# Patient Record
Sex: Female | Born: 1945 | Race: White | Hispanic: No | Marital: Married | Smoking: Former smoker
Health system: Southern US, Community
[De-identification: ages and names within clinical notes are randomized; demographics above are authoritative.]

## PROBLEM LIST (undated history)

## (undated) DIAGNOSIS — F1190 Opioid use, unspecified, uncomplicated: Secondary | ICD-10-CM

## (undated) DIAGNOSIS — M48061 Spinal stenosis, lumbar region without neurogenic claudication: Secondary | ICD-10-CM

## (undated) DIAGNOSIS — E782 Mixed hyperlipidemia: Secondary | ICD-10-CM

## (undated) DIAGNOSIS — Z973 Presence of spectacles and contact lenses: Secondary | ICD-10-CM

## (undated) DIAGNOSIS — I959 Hypotension, unspecified: Secondary | ICD-10-CM

## (undated) DIAGNOSIS — F419 Anxiety disorder, unspecified: Secondary | ICD-10-CM

## (undated) DIAGNOSIS — N183 Chronic kidney disease, stage 3 unspecified: Secondary | ICD-10-CM

## (undated) DIAGNOSIS — Z972 Presence of dental prosthetic device (complete) (partial): Secondary | ICD-10-CM

## (undated) DIAGNOSIS — E1140 Type 2 diabetes mellitus with diabetic neuropathy, unspecified: Secondary | ICD-10-CM

## (undated) DIAGNOSIS — I4510 Unspecified right bundle-branch block: Secondary | ICD-10-CM

## (undated) DIAGNOSIS — Z8709 Personal history of other diseases of the respiratory system: Secondary | ICD-10-CM

## (undated) DIAGNOSIS — Z87440 Personal history of urinary (tract) infections: Secondary | ICD-10-CM

## (undated) DIAGNOSIS — Z8679 Personal history of other diseases of the circulatory system: Secondary | ICD-10-CM

## (undated) DIAGNOSIS — Z8719 Personal history of other diseases of the digestive system: Secondary | ICD-10-CM

## (undated) DIAGNOSIS — R600 Localized edema: Secondary | ICD-10-CM

## (undated) DIAGNOSIS — T148XXD Other injury of unspecified body region, subsequent encounter: Secondary | ICD-10-CM

## (undated) DIAGNOSIS — Z87898 Personal history of other specified conditions: Secondary | ICD-10-CM

## (undated) DIAGNOSIS — M545 Low back pain, unspecified: Secondary | ICD-10-CM

## (undated) DIAGNOSIS — K219 Gastro-esophageal reflux disease without esophagitis: Secondary | ICD-10-CM

## (undated) DIAGNOSIS — Z794 Long term (current) use of insulin: Secondary | ICD-10-CM

## (undated) DIAGNOSIS — R0609 Other forms of dyspnea: Secondary | ICD-10-CM

## (undated) DIAGNOSIS — E119 Type 2 diabetes mellitus without complications: Secondary | ICD-10-CM

## (undated) DIAGNOSIS — G8929 Other chronic pain: Secondary | ICD-10-CM

## (undated) DIAGNOSIS — J309 Allergic rhinitis, unspecified: Secondary | ICD-10-CM

## (undated) DIAGNOSIS — N1330 Unspecified hydronephrosis: Secondary | ICD-10-CM

## (undated) DIAGNOSIS — I452 Bifascicular block: Secondary | ICD-10-CM

## (undated) DIAGNOSIS — R0600 Dyspnea, unspecified: Secondary | ICD-10-CM

## (undated) DIAGNOSIS — M479 Spondylosis, unspecified: Secondary | ICD-10-CM

## (undated) DIAGNOSIS — E041 Nontoxic single thyroid nodule: Secondary | ICD-10-CM

## (undated) DIAGNOSIS — D649 Anemia, unspecified: Secondary | ICD-10-CM

## (undated) DIAGNOSIS — E785 Hyperlipidemia, unspecified: Secondary | ICD-10-CM

## (undated) HISTORY — PX: MULTIPLE TOOTH EXTRACTIONS: SHX2053

---

## 1983-12-29 DIAGNOSIS — Z8709 Personal history of other diseases of the respiratory system: Secondary | ICD-10-CM

## 1983-12-29 DIAGNOSIS — Z8719 Personal history of other diseases of the digestive system: Secondary | ICD-10-CM

## 1983-12-29 HISTORY — DX: Personal history of other diseases of the respiratory system: Z87.09

## 1983-12-29 HISTORY — DX: Personal history of other diseases of the digestive system: Z87.19

## 1983-12-29 HISTORY — PX: OTHER SURGICAL HISTORY: SHX169

## 2013-08-21 ENCOUNTER — Other Ambulatory Visit: Payer: Self-pay | Admitting: Endocrinology

## 2013-09-11 ENCOUNTER — Other Ambulatory Visit: Payer: Self-pay | Admitting: Endocrinology

## 2013-09-12 ENCOUNTER — Other Ambulatory Visit: Payer: Self-pay | Admitting: *Deleted

## 2013-09-12 DIAGNOSIS — E785 Hyperlipidemia, unspecified: Secondary | ICD-10-CM | POA: Insufficient documentation

## 2013-09-12 DIAGNOSIS — E119 Type 2 diabetes mellitus without complications: Secondary | ICD-10-CM

## 2013-09-18 ENCOUNTER — Ambulatory Visit (INDEPENDENT_AMBULATORY_CARE_PROVIDER_SITE_OTHER): Payer: BC Managed Care – PPO | Admitting: Endocrinology

## 2013-09-18 ENCOUNTER — Encounter: Payer: Self-pay | Admitting: Endocrinology

## 2013-09-18 ENCOUNTER — Other Ambulatory Visit (INDEPENDENT_AMBULATORY_CARE_PROVIDER_SITE_OTHER): Payer: BC Managed Care – PPO

## 2013-09-18 ENCOUNTER — Telehealth: Payer: Self-pay | Admitting: Endocrinology

## 2013-09-18 VITALS — BP 120/60 | HR 88 | Temp 99.3°F | Ht 63.0 in | Wt 311.3 lb

## 2013-09-18 DIAGNOSIS — IMO0001 Reserved for inherently not codable concepts without codable children: Secondary | ICD-10-CM

## 2013-09-18 DIAGNOSIS — E785 Hyperlipidemia, unspecified: Secondary | ICD-10-CM

## 2013-09-18 DIAGNOSIS — Z23 Encounter for immunization: Secondary | ICD-10-CM

## 2013-09-18 DIAGNOSIS — E119 Type 2 diabetes mellitus without complications: Secondary | ICD-10-CM

## 2013-09-18 LAB — COMPREHENSIVE METABOLIC PANEL
ALT: 21 U/L (ref 0–35)
Albumin: 3.9 g/dL (ref 3.5–5.2)
CO2: 26 mEq/L (ref 19–32)
Calcium: 9.4 mg/dL (ref 8.4–10.5)
Chloride: 107 mEq/L (ref 96–112)
GFR: 50.74 mL/min — ABNORMAL LOW (ref >60.00)
Glucose, Bld: 236 mg/dL — ABNORMAL HIGH (ref 70–99)
Potassium: 4.7 mEq/L (ref 3.5–5.1)
Sodium: 140 mEq/L (ref 135–145)
Total Bilirubin: 0.4 mg/dL (ref 0.3–1.2)
Total Protein: 6.8 g/dL (ref 6.0–8.3)

## 2013-09-18 LAB — URINALYSIS, ROUTINE W REFLEX MICROSCOPIC
Bilirubin Urine: NEGATIVE
Ketones, ur: NEGATIVE
Urine Glucose: NEGATIVE
pH: 6 (ref 5.0–8.0)

## 2013-09-18 LAB — MICROALBUMIN / CREATININE URINE RATIO
Creatinine,U: 57.8 mg/dL
Microalb Creat Ratio: 23.9 mg/g (ref 0.0–30.0)
Microalb, Ur: 13.8 mg/dL — ABNORMAL HIGH (ref 0.0–1.9)

## 2013-09-18 LAB — LIPID PANEL
Cholesterol: 179 mg/dL (ref 0–200)
HDL: 46.3 mg/dL (ref >39.00)
Triglycerides: 204 mg/dL — ABNORMAL HIGH (ref 0.0–149.0)
VLDL: 40.8 mg/dL — ABNORMAL HIGH (ref 0.0–40.0)

## 2013-09-18 LAB — LDL CHOLESTEROL, DIRECT: Direct LDL: 106 mg/dL

## 2013-09-18 NOTE — Patient Instructions (Addendum)
Adjust U-500 insulin based on meals size mostly and add only 2-8 units more for higher readings; take this 30-40 min before eating   Reduce Amlodipine 10mg  to 1/2 daily  Take 2 HCTZ daily

## 2013-09-18 NOTE — Progress Notes (Signed)
Patient ID: Valerie [name], female   DOB: 1946/02/24, 67 y.o.   MRN: [id number]  Valerie [name] is an 67 y.o. female.   Reason for Appointment: Diabetes follow-up   History of Present Illness   Diagnosis: Type 2 DIABETES MELITUS, date of diagnosis: 1985      Previous history: She has been on insulin since her diagnosis and typically has had very labile blood sugars which are difficult to control evenly. She has been on various regimens including NPH, regular insulin and Lantus. She is usually requiring large doses of insulin and appears to do better with taking NPH rather than Lantus Also has been on Byetta which improves with postprandial control However has had a tendency to hypoglycemia fairly often especially after supper Her A1c generally tends to be lower than expected for her home readings  Recent history:     Because of difficulty getting her fasting readings down she was switched from Humalog to U-500 insulin She adjusts her insulin based on her blood sugar mostly and readings are still fluctuating considerably She was having very frequent hypoglycemia throughout the day until about 10 days ago and she had drastically reduce her insulin because of this However more recently her blood sugars are starting to increase and she is going up on her insulin again She had also stopped her Byetta but is going back on it this week. Currently using 10 mcg TIMING of insulin: She does tend to take her U 500 right at mealtime and occasionally after Also she will increase her dosage by 10-15 units if blood sugar is high and occasionally this may result in hypoglycemia She does tend to have relatively high postprandial readings in the mornings but usually not in the evenings  Oral hypoglycemic drugs: None        Side effects from medications: None Insulin regimen: NPH a.m. and 11 pm,  50-60 units.    U-500, 10 -30 U with meals  Monitors blood glucose: Once a day.    Glucometer: One Touch.           Blood Glucose readings from meter download: readings before breakfast: 59-230, nonfasting recently 31-303  Hypoglycemia frequency:  recently had sporadic low sugars overnight or midday but overall has had low sugars at all different times, lowest reading 25 overnight       Meals: 3 meals per day.  has breakfast at 8 AM, supper at 5:30 p.m.     The last HbgA1c was reported as 5.8 in February. Fructosamine 291 in 10/13    Wt Readings from Last 3 Encounters:  09/18/13 311 lb 4.8 oz (141.205 kg)    LABS:  Appointment on 09/18/2013  Component Date Value Range Status  . Hemoglobin A1C 09/18/2013 5.6  4.6 - 6.5 % Final   Glycemic Control Guidelines for People with Diabetes:Non Diabetic:  <6%Goal of Therapy: <7%Additional Action Suggested:  >8%   . Sodium 09/18/2013 140  135 - 145 mEq/L Final  . Potassium 09/18/2013 4.7  3.5 - 5.1 mEq/L Final  . Chloride 09/18/2013 107  96 - 112 mEq/L Final  . CO2 09/18/2013 26  19 - 32 mEq/L Final  . Glucose, Bld 09/18/2013 236* 70 - 99 mg/dL Final  . BUN 40/98/1191 33* 6 - 23 mg/dL Final  . Creatinine, Ser 09/18/2013 1.1  0.4 - 1.2 mg/dL Final  . Total Bilirubin 09/18/2013 0.4  0.3 - 1.2 mg/dL Final  . Alkaline Phosphatase 09/18/2013 40  39 - 117 U/L Final  .  AST 09/18/2013 23  0 - 37 U/L Final  . ALT 09/18/2013 21  0 - 35 U/L Final  . Total Protein 09/18/2013 6.8  6.0 - 8.3 g/dL Final  . Albumin 13/07/6577 3.9  3.5 - 5.2 g/dL Final  . Calcium 46/96/2952 9.4  8.4 - 10.5 mg/dL Final  . GFR 84/13/2440 50.74* >60.00 mL/min Final  . Microalb, Ur 09/18/2013 13.8* 0.0 - 1.9 mg/dL Final  . Creatinine,U 10/24/2535 57.8   Final  . Microalb Creat Ratio 09/18/2013 23.9  0.0 - 30.0 mg/g Final  . Cholesterol 09/18/2013 179  0 - 200 mg/dL Final   ATP III Classification       Desirable:  < 200 mg/dL               Borderline High:  200 - 239 mg/dL          High:  > = 644 mg/dL  . Triglycerides 09/18/2013 204.0* 0.0 - 149.0 mg/dL Final   Normal:  <034 mg/dLBorderline  High:  150 - 199 mg/dL  . HDL 09/18/2013 46.30  >39.00 mg/dL Final  . VLDL 74/25/9563 40.8* 0.0 - 40.0 mg/dL Final  . Total CHOL/HDL Ratio 09/18/2013 4   Final                  Men          Women1/2 Average Risk     3.4          3.3Average Risk          5.0          4.42X Average Risk          9.6          7.13X Average Risk          15.0          11.0                      . Color, Urine 09/18/2013 LT. YELLOW  Yellow;Lt. Yellow Final  . APPearance 09/18/2013 CLEAR  Clear Final  . Specific Gravity, Urine 09/18/2013 1.025  1.000-1.030 Final  . pH 09/18/2013 6.0  5.0 - 8.0 Final  . Total Protein, Urine 09/18/2013 TRACE  Negative Final  . Urine Glucose 09/18/2013 NEGATIVE  Negative Final  . Ketones, ur 09/18/2013 NEGATIVE  Negative Final  . Bilirubin Urine 09/18/2013 NEGATIVE  Negative Final  . Hgb urine dipstick 09/18/2013 LARGE  Negative Final  . Urobilinogen, UA 09/18/2013 0.2  0.0 - 1.0 Final  . Leukocytes, UA 09/18/2013 MODERATE  Negative Final  . Nitrite 09/18/2013 NEGATIVE  Negative Final  . WBC, UA 09/18/2013 21-50/hpf  0-2/hpf Final  . RBC / HPF 09/18/2013 11-20/hpf  0-2/hpf Final  . Mucus, UA 09/18/2013 Presence of  None Final  . Squamous Epithelial / LPF 09/18/2013 Few(5-10/hpf)  Rare(0-4/hpf) Final  . Bacteria, UA 09/18/2013 Few(10-50/hpf)  None Final  . Direct LDL 09/18/2013 106.0   Final   Optimal:  <100 mg/dLNear or Above Optimal:  100-129 mg/dLBorderline High:  130-159 mg/dLHigh:  160-189 mg/dLVery High:  >190 mg/dL      Medication List       This list is accurate as of: 09/18/13 11:59 PM.  Always use your most recent med list.               amLODipine 10 MG tablet  Commonly known as:  NORVASC  Take 10 mg by mouth daily.     BYETTA  10 MCG PEN 10 MCG/0.04ML Sopn injection  Generic drug:  exenatide  Inject 10 mcg into the skin 2 (two) times daily with a meal.     fenofibrate 160 MG tablet  TAKE 1 TABLET BY MOUTH ONCE DAILY     glucose blood test strip  1 each  by Other route as needed for other. Use as instructed. One Touch Ultra II test strips     HUMULIN R 500 UNIT/ML Soln injection  Generic drug:  insulin regular human CONCENTRATED  Taking Humulin R U-500 draw up 15-30 units on a U-100 insulin syringe per Dr. Lucianne Muss daily.     hydrochlorothiazide 12.5 MG capsule  Commonly known as:  MICROZIDE  TAKE 1 CAPSULE BY MOUTH DAILY     NOVOLIN N 100 UNIT/ML injection  Generic drug:  insulin NPH  INJECT 50 UNITS UNDER THE SKIN EVERY MORNING AND 70 UNITS EVERY NIGHT AT BEDTIME     ONE TOUCH LANCETS Misc  by Does not apply route.        Allergies: No Known Allergies  No past medical history on file.  No past surgical history on file.  No family history on file.  Social History:  reports that she has quit smoking. She does not have any smokeless tobacco history on file. Her alcohol and drug histories are not on file.  Review of Systems:  Allergic rhinitis: She was given Flonase on her last visit and told to avoid decongestants  She is complaining of her leg swelling for the last few weeks. Not on any diuretics or ACE inhibitors. No shortness of breath  Hypertension:  she had hyperkalemia with lisinopril and is only on amlodipine and HCTZ  Lipids: She has had high triglycerides and LDL, on 2 drugs now      Examination:   BP 120/60  Pulse 88  Temp(Src) 99.3 F (37.4 C) (Oral)  Ht 5\' 3"  (1.6 m)  Wt 311 lb 4.8 oz (141.205 kg)  BMI 55.16 kg/m2  SpO2 96%  Body mass index is 55.16 kg/(m^2).   2+ edema present  ASSESSMENT/ PLAN::   Diabetes type 2   Blood glucose control is very erratic in the last month and partly from intercurrent medical problems that she has had She has been adjusting her insulin based on her blood sugar patterns and is having less hypoglycemia recently. Her blood sugars are relatively higher the last week or so but partly because of taking her mealtime insulin right at mealtime or after She still has a  tendency to relatively high fasting readings at times and occasional nocturnal hypoglycemia  Recommended that she take her U-500 insulin regimen as before eating. Adjust U-500 insulin based on meals size mostly and add only 2-8 units more for higher readings   She will continue same doses of NPH and Byetta for now Will use fructosamine to assess her blood sugar control on the next visit since A1c is falsely low  EDEMA: Since her blood pressure is low normal would reduce her amlodipine to half tablet and increase her HCTZ to 25 mg Consider losartan if blood pressure higher  Hyperlipidemia: To have labs checked today  Counseling time over 50% of today's 25 minute visit  Valerie [name] 09/21/2013, 12:13 PM   Addendum: LDL is over 100, start Lipitor 10 mg daily

## 2013-09-18 NOTE — Telephone Encounter (Signed)
Already spoke to patient and gave her med info.

## 2013-09-19 ENCOUNTER — Telehealth: Payer: Self-pay | Admitting: *Deleted

## 2013-09-19 NOTE — Telephone Encounter (Signed)
Patient would like her lab results mailed to her with they are reviewed.

## 2013-09-20 ENCOUNTER — Other Ambulatory Visit: Payer: Self-pay | Admitting: *Deleted

## 2013-09-20 MED ORDER — ATORVASTATIN CALCIUM 10 MG PO TABS: 10.0000 mg | ORAL_TABLET | Freq: Every day | ORAL | Status: DC

## 2013-09-25 ENCOUNTER — Other Ambulatory Visit: Payer: Self-pay | Admitting: Endocrinology

## 2013-10-05 ENCOUNTER — Other Ambulatory Visit: Payer: Self-pay | Admitting: Endocrinology

## 2013-10-16 ENCOUNTER — Other Ambulatory Visit: Payer: Self-pay | Admitting: *Deleted

## 2013-10-16 ENCOUNTER — Telehealth: Payer: Self-pay | Admitting: Endocrinology

## 2013-10-16 MED ORDER — GLUCOSE BLOOD VI STRP: ORAL_STRIP | Status: DC

## 2013-10-16 MED ORDER — ONETOUCH LANCETS MISC: Status: DC

## 2013-10-16 NOTE — Telephone Encounter (Signed)
Pt upset b/c our office denied RF request to RF test strips. She does not understand why we would this and wants these called in today./ [name]

## 2013-10-16 NOTE — Telephone Encounter (Signed)
Valerie [name] spoke with her, we never got a request for her test strips, we did call them in after speaking with her.

## 2013-10-16 NOTE — Telephone Encounter (Signed)
rx changed, patient is checking 8 times per day

## 2013-10-30 ENCOUNTER — Other Ambulatory Visit: Payer: Self-pay | Admitting: *Deleted

## 2013-10-30 MED ORDER — HYDROCHLOROTHIAZIDE 12.5 MG PO CAPS: 12.5000 mg | ORAL_CAPSULE | Freq: Two times a day (BID) | ORAL | Status: DC

## 2013-12-09 ENCOUNTER — Other Ambulatory Visit: Payer: Self-pay | Admitting: Endocrinology

## 2014-01-01 ENCOUNTER — Other Ambulatory Visit: Payer: Self-pay | Admitting: *Deleted

## 2014-01-01 MED ORDER — INSULIN REGULAR HUMAN (CONC) 500 UNIT/ML ~~LOC~~ SOLN
SUBCUTANEOUS | Status: DC
Start: 2014-01-01 — End: 2014-01-02

## 2014-01-02 ENCOUNTER — Other Ambulatory Visit: Payer: Self-pay | Admitting: *Deleted

## 2014-01-02 ENCOUNTER — Telehealth: Payer: Self-pay | Admitting: *Deleted

## 2014-01-02 MED ORDER — INSULIN REGULAR HUMAN (CONC) 500 UNIT/ML ~~LOC~~ SOLN: SUBCUTANEOUS | Status: DC

## 2014-01-02 NOTE — Telephone Encounter (Signed)
Pt called about her Humulin R U-500, she said she's taking 15 units TID plus extra for a sliding scale, she said her insurance company is saying she only needs 1 bottle per month but with it written for 15 units TID she's running out.  Please advise.

## 2014-01-03 NOTE — Telephone Encounter (Signed)
1 bottle of U-500 contains 10,000 units, should be lasting for at least one month

## 2014-01-18 ENCOUNTER — Other Ambulatory Visit: Payer: BC Managed Care – PPO

## 2014-01-18 ENCOUNTER — Ambulatory Visit: Payer: BC Managed Care – PPO | Admitting: Endocrinology

## 2014-01-31 ENCOUNTER — Ambulatory Visit: Payer: BC Managed Care – PPO | Admitting: Endocrinology

## 2014-01-31 ENCOUNTER — Other Ambulatory Visit: Payer: BC Managed Care – PPO

## 2014-02-20 ENCOUNTER — Ambulatory Visit: Payer: BC Managed Care – PPO | Admitting: Endocrinology

## 2014-02-20 ENCOUNTER — Other Ambulatory Visit: Payer: BC Managed Care – PPO

## 2014-03-07 ENCOUNTER — Encounter: Payer: Self-pay | Admitting: Endocrinology

## 2014-03-07 ENCOUNTER — Other Ambulatory Visit: Payer: Medicare Other

## 2014-03-07 ENCOUNTER — Ambulatory Visit (INDEPENDENT_AMBULATORY_CARE_PROVIDER_SITE_OTHER): Payer: 59 | Admitting: Endocrinology

## 2014-03-07 ENCOUNTER — Other Ambulatory Visit: Payer: Self-pay | Admitting: Endocrinology

## 2014-03-07 VITALS — BP 134/78 | HR 84 | Temp 98.3°F | Resp 18 | Ht 63.0 in | Wt 305.8 lb

## 2014-03-07 DIAGNOSIS — E785 Hyperlipidemia, unspecified: Secondary | ICD-10-CM

## 2014-03-07 DIAGNOSIS — I10 Essential (primary) hypertension: Secondary | ICD-10-CM | POA: Insufficient documentation

## 2014-03-07 DIAGNOSIS — E1165 Type 2 diabetes mellitus with hyperglycemia: Principal | ICD-10-CM

## 2014-03-07 DIAGNOSIS — IMO0001 Reserved for inherently not codable concepts without codable children: Secondary | ICD-10-CM

## 2014-03-07 NOTE — Progress Notes (Signed)
Patient ID: Valerie [name], female   DOB: 1946-02-05, 68 y.o.   MRN: [id number]   Reason for Appointment: Diabetes follow-up   History of Present Illness   Diagnosis: Type 2 DIABETES MELITUS, date of diagnosis: 1985      Previous history: She has been on insulin since her diagnosis and typically has had very labile blood sugars which are difficult to control evenly. She has been on various regimens including NPH, regular insulin and Lantus. She is usually requiring large doses of insulin and appears to do better with taking NPH rather than Lantus Also has been on Byetta which improves with postprandial control However has had a tendency to hypoglycemia fairly often especially after supper Her A1c generally tends to be lower than expected for her home readings    Because of difficulty getting her fasting readings down she was switched from Humalog to U-500 insulin in 2014  Recent history: Insulin regimen:   NPH 50 a.m.--70 units at bedtime.    U-500 insulin: 20 or 30 units before meals Her blood sugars are not as labile as the last visit but still fluctuating significantly at all times On her last visit she was advised not to adjust her NPH based on blood sugars and take the evening dose at bedtime Current patterns:  Relatively low readings overnight including some hypoglycemia. Blood sugars around 2-3 AM are averaging about 100 and lowest 49   Tendency to low blood sugars after her evening meal, lowest blood sugar 33. Was having more frequent low blood sugars until about a week ago  Relatively high readings at breakfast time and suppertime  Significant variability in blood sugars overall with standard deviation 61 and most significant variability around 5-6 PM  She adjusts her U-500 insulin based on her blood sugar mostly  Also may take 5 units at bedtime if blood sugar is high and she did this last night causing her morning sugar to be 59 today TIMING of insulin: She does tend to take  her U 500, about 10-30 minutes before her meals since her last visit  Oral hypoglycemic drugs: None     Monitors blood glucose:  7.5 times a day   Glucometer: One Touch. Her overall median is 124 with standard deviation 61            PREMEAL Breakfast Lunch Dinner  late p.m.   overnight   Glucose range:  102-198   44-271   29-293   33-246   49-186   Mean/median:  172        Meals: 3 meals per day.  has breakfast at 8 AM, supper at 5:30 p.m.     The last HbgA1c was reported as 5.8 in February. Fructosamine 291 in 10/13    Wt Readings from Last 3 Encounters:  03/07/14 305 lb 12.8 oz (138.71 kg)  09/18/13 311 lb 4.8 oz (141.205 kg)    LABS:  No visits with results within 1 Week(s) from this visit. Latest known visit with results is:  Appointment on 09/18/2013  Component Date Value Ref Range Status  . Hemoglobin A1C 09/18/2013 5.6  4.6 - 6.5 % Final   Glycemic Control Guidelines for People with Diabetes:Non Diabetic:  <6%Goal of Therapy: <7%Additional Action Suggested:  >8%   . Sodium 09/18/2013 140  135 - 145 mEq/L Final  . Potassium 09/18/2013 4.7  3.5 - 5.1 mEq/L Final  . Chloride 09/18/2013 107  96 - 112 mEq/L Final  . CO2 09/18/2013 26  19 -  32 mEq/L Final  . Glucose, Bld 09/18/2013 236* 70 - 99 mg/dL Final  . BUN 16/09/9603 33* 6 - 23 mg/dL Final  . Creatinine, Ser 09/18/2013 1.1  0.4 - 1.2 mg/dL Final  . Total Bilirubin 09/18/2013 0.4  0.3 - 1.2 mg/dL Final  . Alkaline Phosphatase 09/18/2013 40  39 - 117 U/L Final  . AST 09/18/2013 23  0 - 37 U/L Final  . ALT 09/18/2013 21  0 - 35 U/L Final  . Total Protein 09/18/2013 6.8  6.0 - 8.3 g/dL Final  . Albumin 54/08/8118 3.9  3.5 - 5.2 g/dL Final  . Calcium 14/78/2956 9.4  8.4 - 10.5 mg/dL Final  . GFR 21/30/8657 50.74* >60.00 mL/min Final  . Microalb, Ur 09/18/2013 13.8* 0.0 - 1.9 mg/dL Final  . Creatinine,U 84/69/6295 57.8   Final  . Microalb Creat Ratio 09/18/2013 23.9  0.0 - 30.0 mg/g Final  . Cholesterol 09/18/2013 179   0 - 200 mg/dL Final   ATP III Classification       Desirable:  < 200 mg/dL               Borderline High:  200 - 239 mg/dL          High:  > = 284 mg/dL  . Triglycerides 09/18/2013 204.0* 0.0 - 149.0 mg/dL Final   Normal:  <132 mg/dLBorderline High:  150 - 199 mg/dL  . HDL 09/18/2013 46.30  >39.00 mg/dL Final  . VLDL 44/12/270 40.8* 0.0 - 40.0 mg/dL Final  . Total CHOL/HDL Ratio 09/18/2013 4   Final                  Men          Women1/2 Average Risk     3.4          3.3Average Risk          5.0          4.42X Average Risk          9.6          7.13X Average Risk          15.0          11.0                      . Color, Urine 09/18/2013 LT. YELLOW  Yellow;Lt. Yellow Final  . APPearance 09/18/2013 CLEAR  Clear Final  . Specific Gravity, Urine 09/18/2013 1.025  1.000-1.030 Final  . pH 09/18/2013 6.0  5.0 - 8.0 Final  . Total Protein, Urine 09/18/2013 TRACE  Negative Final  . Urine Glucose 09/18/2013 NEGATIVE  Negative Final  . Ketones, ur 09/18/2013 NEGATIVE  Negative Final  . Bilirubin Urine 09/18/2013 NEGATIVE  Negative Final  . Hgb urine dipstick 09/18/2013 LARGE  Negative Final  . Urobilinogen, UA 09/18/2013 0.2  0.0 - 1.0 Final  . Leukocytes, UA 09/18/2013 MODERATE  Negative Final  . Nitrite 09/18/2013 NEGATIVE  Negative Final  . WBC, UA 09/18/2013 21-50/hpf  0-2/hpf Final  . RBC / HPF 09/18/2013 11-20/hpf  0-2/hpf Final  . Mucus, UA 09/18/2013 Presence of  None Final  . Squamous Epithelial / LPF 09/18/2013 Few(5-10/hpf)  Rare(0-4/hpf) Final  . Bacteria, UA 09/18/2013 Few(10-50/hpf)  None Final  . Direct LDL 09/18/2013 106.0   Final   Optimal:  <100 mg/dLNear or Above Optimal:  100-129 mg/dLBorderline High:  130-159 mg/dLHigh:  160-189 mg/dLVery High:  >190 mg/dL  Medication List       This list is accurate as of: 03/07/14 10:45 AM.  Always use your most recent med list.               amLODipine 10 MG tablet  Commonly known as:  NORVASC  TAKE 1 TABLET BY MOUTH DAILY      atorvastatin 10 MG tablet  Commonly known as:  LIPITOR  Take 1 tablet (10 mg total) by mouth daily.     BYETTA 10 MCG PEN 10 MCG/0.04ML Sopn injection  Generic drug:  exenatide  Inject 10 mcg into the skin 2 (two) times daily with a meal.     fenofibrate 160 MG tablet  TAKE 1 TABLET BY MOUTH ONCE DAILY     glucose blood test strip  Use as instructed to check blood sugars 8 times a day dx code 250.02     hydrochlorothiazide 12.5 MG capsule  Commonly known as:  MICROZIDE  Take 1 capsule (12.5 mg total) by mouth 2 (two) times daily.     insulin regular human CONCENTRATED 500 UNIT/ML Soln injection  Commonly known as:  HUMULIN R  Taking Humulin R U-500 draw up 70 units on a U-100 insulin syringe per Dr. Lucianne Muss daily.     MYRBETRIQ 50 MG Tb24 tablet  Generic drug:  mirabegron ER  Take 50 mg by mouth daily.     NOVOLIN N 100 UNIT/ML injection  Generic drug:  insulin NPH Human  INJECT 50 UNITS UNDER THE SKIN EVERY MORNING AND 70 UNITS EVERY NIGHT AT BEDTIME     ONE TOUCH LANCETS Misc  Use to check blood sugars 8 times daily        Allergies: No Known Allergies  No past medical history on file.  No past surgical history on file.  No family history on file.  Social History:  reports that she has quit smoking. She does not have any smokeless tobacco history on file. Her alcohol and drug histories are not on file.  Review of Systems:  Allergic rhinitis: She was given Flonase previously   Her leg edema is better with  HCTZ  Hypertension:  she had hyperkalemia with lisinopril and is only on amlodipine and HCTZ  Lipids: She has had high triglycerides and LDL, on 2 drugs now; Lipitor was restarted on her last visit  Lab Results  Component Value Date   CHOL 179 09/18/2013   HDL 46.30 09/18/2013   LDLDIRECT 106.0 09/18/2013   TRIG 204.0* 09/18/2013   CHOLHDL 4 09/18/2013   Has a history of overactive bladder      Examination:   BP 134/78  Pulse 84  Temp(Src) 98.3  F (36.8 C)  Resp 18  Ht 5\' 3"  (1.6 m)  Wt 305 lb 12.8 oz (138.71 kg)  BMI 54.18 kg/m2  SpO2 98%  Body mass index is 54.18 kg/(m^2).     ASSESSMENT/ PLAN:   Diabetes type 2   Blood glucose control is still inconsistent with current complicated regimen of U-500 insulin 3 times a day and NPH twice a day as well as Byetta She tends to have the following blood sugar patterns:  Relatively low readings overnight including some hypoglycemia and tendency to low sugars after dinner also  Relatively high readings at breakfast time and suppertime  Significant variability in blood sugars overall  Recommendations made today:  Change Byetta to Victoza. This should avoid tendency to postprandial hypoglycemia in the evenings along with more even blood sugars throughout  the day especially fasting and before supper. Will try to increase her dose to the maximum dose of 1.8 mg for maximum benefit including weight loss. Discussed how to use the flex pen and will do a rapid titration since she has not had nausea with Byetta. Simple and information packet given  Reduce bedtime NPH by 5 units for now and in steps of 5 units further if overnight blood sugars are still low normal  Increased insulin by only 5 units at mealtimes his blood sugars are mildly increased but below 200  No U-500 insulin at bedtime  Consider insulin pump although she wants to wait on this  Hypertension: Well controlled  Hyperlipidemia: To have labs checked today with restarting Lipitor  Counseling time over 50% of today's 25 minute visit  Miah Boye 03/07/2014, 10:45 AM

## 2014-03-07 NOTE — Patient Instructions (Signed)
Bedtime N 65, and if low overnight reduce at bedtime  Start VICTOZA injection with the sample pen once daily at the same time of the day.  Dial the dose to 0.6 mg for the first 3 days  You may  experience nausea in the first few days which usually gets better the  After 3 days increase the dose to 1.2mg  daily if no nausea and then 1.8.  You may inject in the stomach, thigh or arm.   You will feel fullness of the stomach with starting the medication and should try to keep portions of food small.    [facility] or [facility]

## 2014-03-08 LAB — COMPREHENSIVE METABOLIC PANEL
ALT: 10 U/L (ref 0–35)
AST: 13 U/L (ref 0–37)
Albumin: 4 g/dL (ref 3.5–5.2)
Alkaline Phosphatase: 48 U/L (ref 39–117)
BUN: 34 mg/dL — ABNORMAL HIGH (ref 6–23)
CO2: 18 meq/L — AB (ref 19–32)
Calcium: 9.6 mg/dL (ref 8.4–10.5)
Chloride: 103 mEq/L (ref 96–112)
Creat: 1.27 mg/dL — ABNORMAL HIGH (ref 0.50–1.10)
Glucose, Bld: 110 mg/dL — ABNORMAL HIGH (ref 70–99)
POTASSIUM: 4.7 meq/L (ref 3.5–5.3)
SODIUM: 139 meq/L (ref 135–145)
TOTAL PROTEIN: 6.5 g/dL (ref 6.0–8.3)
Total Bilirubin: 0.3 mg/dL (ref 0.2–1.2)

## 2014-03-08 LAB — LIPID PANEL
CHOLESTEROL: 114 mg/dL (ref 0–200)
HDL: 37 mg/dL — ABNORMAL LOW (ref >39)
LDL CALC: 46 mg/dL (ref 0–99)
Total CHOL/HDL Ratio: 3.1 Ratio
Triglycerides: 156 mg/dL — ABNORMAL HIGH (ref <150)
VLDL: 31 mg/dL (ref 0–40)

## 2014-03-08 MED ORDER — VICTOZA 18 MG/3ML ~~LOC~~ SOPN: 1.8000 mg | PEN_INJECTOR | Freq: Every day | SUBCUTANEOUS | Status: DC

## 2014-03-09 LAB — FRUCTOSAMINE: Fructosamine: 216 umol/L (ref 190–270)

## 2014-03-15 ENCOUNTER — Other Ambulatory Visit: Payer: Self-pay | Admitting: Endocrinology

## 2014-03-21 ENCOUNTER — Telehealth: Payer: Self-pay | Admitting: Endocrinology

## 2014-03-21 NOTE — Telephone Encounter (Signed)
Pharm needs clarification on Humulin R u 500 they need specific directions on how to take the meds

## 2014-04-05 ENCOUNTER — Other Ambulatory Visit: Payer: Self-pay | Admitting: Endocrinology

## 2014-04-27 ENCOUNTER — Other Ambulatory Visit: Payer: Self-pay | Admitting: Endocrinology

## 2014-05-10 ENCOUNTER — Ambulatory Visit: Payer: Medicare Other | Admitting: Endocrinology

## 2014-05-10 ENCOUNTER — Other Ambulatory Visit: Payer: Medicare Other

## 2014-06-08 ENCOUNTER — Other Ambulatory Visit: Payer: Self-pay | Admitting: Endocrinology

## 2014-06-13 ENCOUNTER — Other Ambulatory Visit: Payer: Self-pay | Admitting: Endocrinology

## 2014-07-01 ENCOUNTER — Other Ambulatory Visit: Payer: Self-pay | Admitting: Endocrinology

## 2014-07-07 ENCOUNTER — Other Ambulatory Visit: Payer: Self-pay | Admitting: Endocrinology

## 2014-07-09 ENCOUNTER — Other Ambulatory Visit: Payer: Self-pay | Admitting: Endocrinology

## 2014-07-09 NOTE — Telephone Encounter (Signed)
Pt states that her pharmacy needs her to be seen prior to getting the amlodipine refilled she has an appt 8/17 do we need to move the appt up or can we bridge her  Please advise on what to do for the pt please and call her with answer

## 2014-07-11 ENCOUNTER — Other Ambulatory Visit: Payer: Self-pay | Admitting: Endocrinology

## 2014-07-12 ENCOUNTER — Telehealth: Payer: Self-pay | Admitting: Endocrinology

## 2014-07-12 ENCOUNTER — Other Ambulatory Visit: Payer: Self-pay | Admitting: Endocrinology

## 2014-07-12 ENCOUNTER — Other Ambulatory Visit: Payer: Self-pay | Admitting: *Deleted

## 2014-07-12 MED ORDER — VICTOZA 18 MG/3ML ~~LOC~~ SOPN: PEN_INJECTOR | SUBCUTANEOUS | Status: DC

## 2014-07-12 NOTE — Telephone Encounter (Signed)
Patient called stating that her victoza needs approval   Please call [facility] on [address]   Thank You

## 2014-07-17 ENCOUNTER — Other Ambulatory Visit: Payer: Self-pay | Admitting: Endocrinology

## 2014-08-03 ENCOUNTER — Other Ambulatory Visit: Payer: Self-pay | Admitting: Endocrinology

## 2014-08-11 ENCOUNTER — Other Ambulatory Visit: Payer: Self-pay | Admitting: Endocrinology

## 2014-08-12 ENCOUNTER — Other Ambulatory Visit: Payer: Self-pay | Admitting: Endocrinology

## 2014-08-13 ENCOUNTER — Telehealth: Payer: Self-pay | Admitting: Endocrinology

## 2014-08-13 ENCOUNTER — Ambulatory Visit: Payer: 59 | Admitting: Endocrinology

## 2014-08-13 NOTE — Telephone Encounter (Signed)
Pt needs 2 meds refilled on is the insulin humulin and possibly the amlodipine please call pharmacy they would know the name

## 2014-08-13 NOTE — Telephone Encounter (Signed)
Already faxed.

## 2014-08-23 ENCOUNTER — Telehealth: Payer: Self-pay | Admitting: Endocrinology

## 2014-08-28 ENCOUNTER — Other Ambulatory Visit: Payer: Self-pay | Admitting: Endocrinology

## 2014-08-28 ENCOUNTER — Ambulatory Visit (INDEPENDENT_AMBULATORY_CARE_PROVIDER_SITE_OTHER): Payer: 59 | Admitting: Endocrinology

## 2014-08-28 ENCOUNTER — Encounter: Payer: Self-pay | Admitting: Endocrinology

## 2014-08-28 ENCOUNTER — Other Ambulatory Visit: Payer: Self-pay | Admitting: *Deleted

## 2014-08-28 VITALS — BP 134/74 | HR 83 | Temp 98.6°F | Resp 16 | Ht 63.0 in | Wt 306.4 lb

## 2014-08-28 DIAGNOSIS — E785 Hyperlipidemia, unspecified: Secondary | ICD-10-CM

## 2014-08-28 DIAGNOSIS — E1165 Type 2 diabetes mellitus with hyperglycemia: Principal | ICD-10-CM

## 2014-08-28 DIAGNOSIS — IMO0001 Reserved for inherently not codable concepts without codable children: Secondary | ICD-10-CM

## 2014-08-28 DIAGNOSIS — I10 Essential (primary) hypertension: Secondary | ICD-10-CM

## 2014-08-28 MED ORDER — CANAGLIFLOZIN 100 MG PO TABS: 100.0000 mg | ORAL_TABLET | Freq: Every day | ORAL | Status: DC

## 2014-08-28 MED ORDER — INSULIN REGULAR HUMAN (CONC) 500 UNIT/ML ~~LOC~~ SOLN: 30.0000 [IU] | Freq: Three times a day (TID) | SUBCUTANEOUS | Status: DC

## 2014-08-28 NOTE — Patient Instructions (Signed)
Invokana in ams before breakfast  Stop fluid pill  N insulin reduce by 10 units in am and 15 in pm: 30-40 in am and 35-45 at night  U-500 insulin 15-25 units after eating

## 2014-08-28 NOTE — Progress Notes (Signed)
Patient ID: Valerie [name], female   DOB: 08/13/1946, 68 y.o.   MRN: [id number]   Reason for Appointment: Diabetes follow-up   History of Present Illness   Diagnosis: Type 2 DIABETES MELITUS, date of diagnosis: 1985      Previous history: She has been on insulin since her diagnosis and typically has had very labile blood sugars which are difficult to control evenly. She has been on various regimens including NPH, regular insulin and Lantus. She is usually requiring large doses of insulin and appears to do better with taking NPH rather than Lantus Also has been on Byetta which improves with postprandial control However has had a tendency to hypoglycemia fairly often especially after supper Her A1c generally tends to be lower than expected for her home readings    Because of difficulty getting her fasting readings down she was switched from Humalog to U-500 insulin in 2014  Recent history:  Insulin regimen:   NPH 40-50 a.m.--50-60 units at bedtime.    U-500 insulin: 20 -50 units before meals  She has not been seen in followup for several months because of intercurrent illnesses and other issues She is having significant hyperglycemia with a low blood sugar almost every day and 29% of her readings are below 70 She has been using her U-500 insulin very aggressively and may take up to 50 units when her blood sugars are high which will tend to cause hypoglycemia at various times She also thinks that sometimes she will eat less than what she anticipates at her meal and will get low blood sugars because of taking her insulin before eating  Her NPH doses somewhat less and she is not adjusting it as frequently as she was before She was started on VICTOZA on her last visit instead of Byetta with which she is not having as frequent post prandial hypoglycemia in the evenings as before. May be requiring less basal insulin using Victoza  Current patterns:  Fairly good blood sugars in the mornings and  usually not  waking up with low readings  Frequent low blood sugars at lunch and early afternoon and also sclerotic low blood sugar is around 8-9 PM  Fairly consistently getting low during the night several times a week his blood sugars as low as 38 at 2 AM  Only sporadically have some high readings at various times especially late evening  Oral hypoglycemic drugs: None     Monitors blood glucose:  7 times a day   Glucometer: One Touch. Her overall median is 107 with standard deviation 61            PREMEAL Breakfast Lunch Dinner Bedtime  overnight   Glucose range:  59-217   40-230   48-409   51-257   38-188   Mean/median:  133   64   126    110    Meals: 2-3 meals per day.  has breakfast at 8 AM, supper at 5:30 p.m.     The last HbgA1c was reported as 5.8 in February. Fructosamine 291 in 10/13    Wt Readings from Last 3 Encounters:  08/28/14 306 lb 6.4 oz (138.982 kg)  03/07/14 305 lb 12.8 oz (138.71 kg)  09/18/13 311 lb 4.8 oz (141.205 kg)    LABS:  No visits with results within 1 Week(s) from this visit. Latest known visit with results is:  Appointment on 03/07/2014  Component Date Value Ref Range Status  . Fructosamine 03/07/2014 216  190 - 270 umol/L  Final      Medication List       This list is accurate as of: 08/28/14 11:09 AM.  Always use your most recent med list.               amLODipine 10 MG tablet  Commonly known as:  NORVASC  TAKE 1 TABLET BY MOUTH DAILY     atorvastatin 10 MG tablet  Commonly known as:  LIPITOR  TAKE 1 TABLET BY MOUTH EVERY DAY     fenofibrate 160 MG tablet  TAKE 1 TABLET BY MOUTH DAILY     Fish Oil 1000 MG Caps  Take by mouth.     HUMULIN R 500 UNIT/ML Soln injection  Generic drug:  insulin regular human CONCENTRATED  INJECT 70 UNITS UNDER THE SKIN DAILY AS DIRECTED PER MD     hydrochlorothiazide 12.5 MG capsule  Commonly known as:  MICROZIDE  TAKE 1 CAPSULE BY MOUTH TWICE DAILY     MYRBETRIQ 50 MG Tb24 tablet   Generic drug:  mirabegron ER  Take 50 mg by mouth daily.     NOVOLIN N 100 UNIT/ML injection  Generic drug:  insulin NPH Human  INJECT 50 UNITS SUBCUTANEOUS EVERY MORNING AND 70 UNITS EVERY NIGHT AT BEDTIME     omeprazole 20 MG capsule  Commonly known as:  PRILOSEC  Take 20 mg by mouth daily.     ONE TOUCH LANCETS Misc  Use to check blood sugars 8 times daily     ONE TOUCH ULTRA TEST test strip  Generic drug:  glucose blood  USE AS DIRECTED 8 TIMES PER DAY     TYLENOL EX ST ARTHRITIS PAIN 500 MG tablet  Generic drug:  acetaminophen  Take 500 mg by mouth every 6 (six) hours as needed.     VICTOZA 18 MG/3ML Sopn  Generic drug:  Liraglutide  INJECT 1.8 MG INTO THE SKIN ONCE DAILY AT THE SAME TIME        Allergies: No Known Allergies  No past medical history on file.  No past surgical history on file.  No family history on file.  Social History:  reports that she has quit smoking. She does not have any smokeless tobacco history on file. Her alcohol and drug histories are not on file.  Review of Systems:  Allergic rhinitis: She was given Flonase previously   Her leg edema is better with  HCTZ  Hypertension:  she had hyperkalemia with lisinopril and is only on amlodipine and HCTZ  Lipids: She has had high triglycerides and LDL, on 2 drugs now; Lipitor was restarted on her last visit  Lab Results  Component Value Date   CHOL 114 03/07/2014   HDL 37* 03/07/2014   LDLCALC 46 03/07/2014   LDLDIRECT 106.0 09/18/2013   TRIG 156* 03/07/2014   CHOLHDL 3.1 03/07/2014   Has a history of overactive bladder and is on treatment  She did have a UTIs requiring various antibiotics based on sensitivity      Examination:   BP 134/74  Pulse 83  Temp(Src) 98.6 F (37 C)  Resp 16  Ht  (1.6 m)  Wt 306 lb 6.4 oz (138.982 kg)  BMI 54.29 kg/m2  SpO2 95%  Body mass index is 54.29 kg/(m^2).     ASSESSMENT/ PLAN:   Diabetes type 2   Blood glucose control is still  inconsistent with current  regimen of U-500 insulin 3 times a day and NPH twice a day as well  as dose She is still requiring very large doses of insulin for control Currently does not understand her U-500 insulin as concentrated and she has taken up to 50 units at bedtime See history of present illness for detailed discussion of current blood sugar patterns, problems identified and insulin regimen Although she is a good candidate for an insulin pump with concentrated insulin she is not willing to do this More recently she is having frequent hypoglycemia including overnight and discussed that we need to cut back on her insulin doses especially U-500  Recommendations made today:  Trial of Invokana 100 mg daily. Discussed action of SGLT 2 drugs on lowering glucose by decreasing kidney absorption of glucose, benefits of weight loss and lower blood pressure, possible side effects including candidiasis and dosage regimen   She will need to continue to reduce her insulin as this becomes more effective  She will followup in 3 weeks to review blood sugars again and help adjust insulin doses  May take U-500 after eating to better estimate her mealtime coverage  Discuss possible side effects of Invokana and 150s  She will stop her diuretic was starting Invokana  Check A1c and fructosamine today  If insulin requirement is lowered may consider using Toujeo and U-200 Humalog  Hypertension: Well controlled  Hyperlipidemia: Controlled with Lipitor  Patient Instructions  Invokana in ams before breakfast  Stop fluid pill  N insulin reduce by 10 units in am and 15 in pm: 30-40 in am and 35-45 at night  U-500 insulin 15-25 units after eating    Counseling time over 50% of today's 25 minute visit  Zion Ta 08/28/2014, 11:09 AM

## 2014-09-11 ENCOUNTER — Other Ambulatory Visit: Payer: Self-pay | Admitting: Endocrinology

## 2014-09-12 ENCOUNTER — Other Ambulatory Visit: Payer: Self-pay | Admitting: Endocrinology

## 2014-09-12 NOTE — Telephone Encounter (Signed)
rx was sent this morning 

## 2014-09-12 NOTE — Telephone Encounter (Signed)
Patient need refill of Victoza today.

## 2014-09-25 DIAGNOSIS — N390 Urinary tract infection, site not specified: Secondary | ICD-10-CM | POA: Insufficient documentation

## 2014-09-27 ENCOUNTER — Encounter: Payer: Self-pay | Admitting: Endocrinology

## 2014-09-27 ENCOUNTER — Ambulatory Visit (INDEPENDENT_AMBULATORY_CARE_PROVIDER_SITE_OTHER): Payer: 59 | Admitting: Endocrinology

## 2014-09-27 VITALS — BP 138/76 | HR 86 | Temp 98.4°F | Resp 16 | Ht 63.0 in | Wt 302.2 lb

## 2014-09-27 DIAGNOSIS — E785 Hyperlipidemia, unspecified: Secondary | ICD-10-CM

## 2014-09-27 DIAGNOSIS — IMO0002 Reserved for concepts with insufficient information to code with codable children: Secondary | ICD-10-CM

## 2014-09-27 DIAGNOSIS — E1165 Type 2 diabetes mellitus with hyperglycemia: Secondary | ICD-10-CM

## 2014-09-27 DIAGNOSIS — Z23 Encounter for immunization: Secondary | ICD-10-CM

## 2014-09-27 DIAGNOSIS — I10 Essential (primary) hypertension: Secondary | ICD-10-CM

## 2014-09-27 NOTE — Patient Instructions (Addendum)
Novolin N 45 units on waking up  Reduce U-500 in am by 10 units from current dose

## 2014-09-27 NOTE — Progress Notes (Signed)
Patient ID: Valerie [name], female   DOB: [birth year], 68 y.o.   MRN: [id number]   Reason for Appointment: Diabetes follow-up   History of Present Illness   Diagnosis: Type 2 DIABETES MELITUS, date of diagnosis: 1985      Previous history: She has been on insulin since her diagnosis and typically has had very labile blood sugars which are difficult to control evenly. She has been on various regimens including NPH, regular insulin and Lantus. She is usually requiring large doses of insulin and appears to do better with taking NPH rather than Lantus Also has been on Byetta which improves with postprandial control However has had a tendency to hypoglycemia fairly often especially after supper Her A1c generally tends to be lower than expected for her home readings    Because of difficulty getting her fasting readings down she was switched from Humalog to U-500 insulin in 2014  Recent history:  Insulin regimen:   NPH 50 am -70 at bedtime. U-500 insulin: 20 - 60 units before or after meals  U-500 doses: at breakfast 40- 60; lunch 30, supper 30-60  She continues to take very large amounts of insulin especially of her U-500 insulin and is still having periods of hypoglycemia She is still taking very arbitrary doses of the insulin even though she was told to not use very high doses unless blood sugars are persistently high She also cannot take her insulin consistently before eating as she forgets or she does not know how much he will eat at the time of injection Most recently her blood sugars are tending to be lower mostly before and after lunch time but previously was having some low sugars in the evenings and sporadically overnight also. Her NPH doses are relatively less and she had gone up on the doses about 3-4 weeks ago but her blood sugars were reportedly higher She is continuing VICTOZA  instead of Byetta with which she is not having as frequent post prandial hypoglycemia in the evenings as  before.  May be requiring less basal insulin using Victoza She was given a trial of Invokana on her last visit in 9/15 but she claimed that her blood sugars went up significantly even without reducing her insulin and she stopped it after 3 days Current patterns and problems:  Mildly increased blood sugars around breakfast time, sometimes over 200; this is despite her blood sugars in the early morning around 4 AM not being high usually  She will take her NPH insulin around breakfast time and not when she wakes up  Blood sugars are the lowest midday with median only 67  Blood sugars are the highest overall around breakfast time and sporadically in the evenings also   Blood sugars are overall fairly good around supper time and at bedtime  Oral hypoglycemic drugs: None     Monitors blood glucose:  7.5 times a day   Glucometer: One Touch.  PREMEAL Breakfast Lunch Dinner Bedtime Overall  Glucose range:  55-279   42-221   53-177   45-189    Mean/median:  179   67    119   103+/-56    Overnight blood sugars range 36-314  Meals: 2-3 meals per day.  has breakfast at 9 AM, supper at 5:30 p.m.    Exercise: None because of low back pain   Wt Readings from Last 3 Encounters:  09/27/14 302 lb 3.2 oz (137.077 kg)  08/28/14 306 lb 6.4 oz (138.982 kg)  03/07/14 305 lb  12.8 oz (138.71 kg)    LABS:  Pending    Medication List       This list is accurate as of: 09/27/14  1:56 PM.  Always use your most recent med list.               amLODipine 10 MG tablet  Commonly known as:  NORVASC  TAKE 1 TABLET BY MOUTH DAILY     atorvastatin 10 MG tablet  Commonly known as:  LIPITOR  TAKE 1 TABLET BY MOUTH EVERY DAY     cefdinir 300 MG capsule  Commonly known as:  OMNICEF  Take 300 mg by mouth 2 (two) times daily.     fenofibrate 160 MG tablet  TAKE 1 TABLET BY MOUTH EVERY DAY     Fish Oil 1000 MG Caps  Take by mouth.     hydrochlorothiazide 12.5 MG capsule  Commonly known as:   MICROZIDE  TAKE 1 CAPSULE BY MOUTH TWICE DAILY     insulin regular human CONCENTRATED 500 UNIT/ML Soln injection  Commonly known as:  HUMULIN R  Inject 0.06 mLs (30 Units total) into the skin 3 (three) times daily with meals.     MYRBETRIQ 50 MG Tb24 tablet  Generic drug:  mirabegron ER  Take 50 mg by mouth daily.     NOVOLIN N 100 UNIT/ML injection  Generic drug:  insulin NPH Human  INJECT 50 UNITS UNDER THE SKIN EVERY MORNING AND 70 UNITS EVERY NIGHT AT BEDTIME     omeprazole 20 MG capsule  Commonly known as:  PRILOSEC  Take 20 mg by mouth daily.     ONE TOUCH LANCETS Misc  Use to check blood sugars 8 times daily     ONE TOUCH ULTRA TEST test strip  Generic drug:  glucose blood  USE AS DIRECTED 8 TIMES PER DAY     TYLENOL EX ST ARTHRITIS PAIN 500 MG tablet  Generic drug:  acetaminophen  Take 500 mg by mouth every 6 (six) hours as needed.     VICTOZA 18 MG/3ML Sopn  Generic drug:  Liraglutide  INJECT 1.8 MG INTO THE SKIN ONCE DAILY AT THE SAME TIME        Allergies: No Known Allergies  No past medical history on file.  No past surgical history on file.  No family history on file.  Social History:  reports that she has quit smoking. She does not have any smokeless tobacco history on file. Her alcohol and drug histories are not on file.  Review of Systems:   Her leg edema is better with  HCTZ  Hypertension:  she had hyperkalemia with lisinopril and is only on amlodipine and HCTZ  Lipids: She has had high triglycerides and LDL, on 2 drugs now including Lipitor  Lab Results  Component Value Date   CHOL 114 03/07/2014   HDL 37* 03/07/2014   LDLCALC 46 03/07/2014   LDLDIRECT 106.0 09/18/2013   TRIG 156* 03/07/2014   CHOLHDL 3.1 03/07/2014   Has a history of overactive bladder and is on treatment  She has had UTIs requiring various antibiotics based on sensitivity      Examination:   BP 138/76  Pulse 86  Temp(Src) 98.4 F (36.9 C)  Resp 16  Ht 5\' 3"   (1.6 m)  Wt 302 lb 3.2 oz (137.077 kg)  BMI 53.55 kg/m2  SpO2 96%  Body mass index is 53.55 kg/(m^2).     ASSESSMENT/ PLAN:   Diabetes type 2  Blood glucose control is overall fairly well controlled with current  regimen of U-500 insulin 3 times a day and NPH twice a day  She is still requiring very large doses of insulin again and appears to be still trying to use very large doses of U-500 insulin; she has been counseled on the fact that it is 5 times concentrated She does not reduce her insulin doses adequately despite having periodic hypoglycemia Not clear why she did not respond to Invokana and she does not want to try it again She is asking about Toujeo insulin but previously had not done well with Lantus See history of present illness for detailed discussion of current blood sugar patterns, problems identified and insulin regimen  Recommendations made today:  Reduce morning doses of U-500 by about 10 units and NPH by 5 units to avoid midday and afternoon hypoglycemia  Try to take morning NPH as soon as she wakes up  She should try and take her U 500 insulin more before eating if possible  If her breakfast blood sugars are still inconsistent may consider Toujeo insulin, discussed nature of this insulin and usage  Check A1c  Hypertension: Well controlled  Hyperlipidemia: Controlled with Lipitor  Patient Instructions  Novolin N 45 units on waking up  Reduce U-500 in am by 10 units from current dose      Counseling time over 50% of today's 25 minute visit  Christion Leonhard 09/27/2014, 1:56 PM

## 2014-10-01 ENCOUNTER — Telehealth: Payer: Self-pay | Admitting: Endocrinology

## 2014-10-01 ENCOUNTER — Other Ambulatory Visit: Payer: Self-pay | Admitting: *Deleted

## 2014-10-01 MED ORDER — INSULIN REGULAR HUMAN (CONC) 500 UNIT/ML ~~LOC~~ SOLN: SUBCUTANEOUS | Status: DC

## 2014-10-01 NOTE — Telephone Encounter (Signed)
Patient stated that pharmacy haven't received the fax for  humulin R U 500 concentrate 2 vials at

## 2014-10-01 NOTE — Telephone Encounter (Signed)
rx sent

## 2014-10-03 ENCOUNTER — Other Ambulatory Visit: Payer: Self-pay | Admitting: Endocrinology

## 2014-10-04 ENCOUNTER — Telehealth: Payer: Self-pay | Admitting: *Deleted

## 2014-10-04 NOTE — Telephone Encounter (Signed)
Please call into [facility] the humulin R u 500 concentrated she takes 3 times a day on a sliding scale and needs 2 vials/month  The max dose of the sliding scale is 70 u which means her BS is over 200  Please call in the script for her to be able to get the 2 vials per month that she needs so she does not have to pay for it out of pocket per patient please

## 2014-10-05 ENCOUNTER — Telehealth: Payer: Self-pay | Admitting: *Deleted

## 2014-10-05 NOTE — Telephone Encounter (Signed)
Having trouble getting RX filled corrected 20 ml U-500 concentrated  humalin R each month, she needs is 30 ml in order to make it through an average month, 20 ml is 333 units per day 30 ml 500 units per day, her usuag rate is average 500 units per day she needs 30 instead of the 20 per her husband. The quicker he can write it would help she is completely out

## 2014-10-08 ENCOUNTER — Other Ambulatory Visit: Payer: Self-pay | Admitting: *Deleted

## 2014-10-08 MED ORDER — INSULIN REGULAR HUMAN (CONC) 500 UNIT/ML ~~LOC~~ SOLN
SUBCUTANEOUS | Status: DC
Start: 2014-10-08 — End: 2015-01-07

## 2014-10-10 ENCOUNTER — Other Ambulatory Visit: Payer: Self-pay | Admitting: Endocrinology

## 2014-11-08 ENCOUNTER — Other Ambulatory Visit: Payer: Self-pay | Admitting: Endocrinology

## 2014-12-25 ENCOUNTER — Other Ambulatory Visit: Payer: Self-pay | Admitting: Endocrinology

## 2014-12-28 HISTORY — PX: CATARACT EXTRACTION W/ INTRAOCULAR LENS  IMPLANT, BILATERAL: SHX1307

## 2014-12-31 ENCOUNTER — Ambulatory Visit: Payer: 59 | Admitting: Endocrinology

## 2015-01-02 ENCOUNTER — Other Ambulatory Visit: Payer: Self-pay | Admitting: Endocrinology

## 2015-01-02 MED ORDER — INSULIN NPH (HUMAN) (ISOPHANE) 100 UNIT/ML ~~LOC~~ SUSP: SUBCUTANEOUS | Status: DC

## 2015-01-05 ENCOUNTER — Other Ambulatory Visit: Payer: Self-pay | Admitting: Endocrinology

## 2015-01-06 ENCOUNTER — Other Ambulatory Visit: Payer: Self-pay | Admitting: Endocrinology

## 2015-01-07 ENCOUNTER — Other Ambulatory Visit: Payer: Self-pay | Admitting: Endocrinology

## 2015-01-07 ENCOUNTER — Other Ambulatory Visit: Payer: Self-pay | Admitting: *Deleted

## 2015-01-07 MED ORDER — INSULIN NPH (HUMAN) (ISOPHANE) 100 UNIT/ML ~~LOC~~ SUSP: SUBCUTANEOUS | Status: DC

## 2015-01-07 MED ORDER — INSULIN REGULAR HUMAN (CONC) 500 UNIT/ML ~~LOC~~ SOLN: SUBCUTANEOUS | Status: DC

## 2015-02-03 ENCOUNTER — Other Ambulatory Visit: Payer: Self-pay | Admitting: Endocrinology

## 2015-02-05 ENCOUNTER — Other Ambulatory Visit: Payer: Self-pay | Admitting: Endocrinology

## 2015-02-07 ENCOUNTER — Telehealth: Payer: Self-pay | Admitting: Endocrinology

## 2015-02-07 ENCOUNTER — Other Ambulatory Visit: Payer: Self-pay | Admitting: *Deleted

## 2015-02-07 MED ORDER — INSULIN PEN NEEDLE 32G X 5 MM MISC: Status: DC

## 2015-02-07 NOTE — Telephone Encounter (Signed)
rx sent

## 2015-02-07 NOTE — Telephone Encounter (Signed)
Snheal calling from [facility] pharmacy stated that patient need pen needles for Victoza.

## 2015-02-13 ENCOUNTER — Other Ambulatory Visit: Payer: Self-pay | Admitting: Endocrinology

## 2015-03-04 ENCOUNTER — Other Ambulatory Visit: Payer: Self-pay | Admitting: Endocrinology

## 2015-03-07 ENCOUNTER — Ambulatory Visit: Payer: Self-pay | Admitting: Endocrinology

## 2015-03-27 ENCOUNTER — Other Ambulatory Visit: Payer: Self-pay | Admitting: Endocrinology

## 2015-04-03 ENCOUNTER — Other Ambulatory Visit: Payer: Self-pay | Admitting: Endocrinology

## 2015-04-03 ENCOUNTER — Ambulatory Visit: Payer: Self-pay | Admitting: Endocrinology

## 2015-04-15 ENCOUNTER — Ambulatory Visit (INDEPENDENT_AMBULATORY_CARE_PROVIDER_SITE_OTHER): Payer: Medicare Other | Admitting: Endocrinology

## 2015-04-15 ENCOUNTER — Encounter: Payer: Self-pay | Admitting: Endocrinology

## 2015-04-15 VITALS — BP 132/76 | HR 94 | Temp 98.3°F | Resp 16 | Ht 63.0 in | Wt 288.2 lb

## 2015-04-15 DIAGNOSIS — I10 Essential (primary) hypertension: Secondary | ICD-10-CM

## 2015-04-15 DIAGNOSIS — E785 Hyperlipidemia, unspecified: Secondary | ICD-10-CM

## 2015-04-15 DIAGNOSIS — IMO0002 Reserved for concepts with insufficient information to code with codable children: Secondary | ICD-10-CM

## 2015-04-15 DIAGNOSIS — E1165 Type 2 diabetes mellitus with hyperglycemia: Secondary | ICD-10-CM | POA: Diagnosis not present

## 2015-04-15 LAB — POCT URINALYSIS DIPSTICK
BILIRUBIN UA: NEGATIVE
Glucose, UA: NEGATIVE
KETONES UA: NEGATIVE
Nitrite, UA: NEGATIVE
SPEC GRAV UA: 1.015
Urobilinogen, UA: 0.2
pH, UA: 6

## 2015-04-15 LAB — LIPID PANEL
CHOLESTEROL: 146 mg/dL (ref 0–200)
HDL: 38.2 mg/dL — ABNORMAL LOW (ref >39.00)
NONHDL: 107.8
TRIGLYCERIDES: 292 mg/dL — AB (ref 0.0–149.0)
Total CHOL/HDL Ratio: 4
VLDL: 58.4 mg/dL — ABNORMAL HIGH (ref 0.0–40.0)

## 2015-04-15 LAB — BASIC METABOLIC PANEL
BUN: 34 mg/dL — ABNORMAL HIGH (ref 6–23)
CHLORIDE: 109 meq/L (ref 96–112)
CO2: 23 mEq/L (ref 19–32)
Calcium: 9.8 mg/dL (ref 8.4–10.5)
Creatinine, Ser: 1.34 mg/dL — ABNORMAL HIGH (ref 0.40–1.20)
GFR: 41.49 mL/min — ABNORMAL LOW (ref >60.00)
GLUCOSE: 90 mg/dL (ref 70–99)
Potassium: 4.4 mEq/L (ref 3.5–5.1)
SODIUM: 141 meq/L (ref 135–145)

## 2015-04-15 LAB — LDL CHOLESTEROL, DIRECT: Direct LDL: 71 mg/dL

## 2015-04-15 LAB — HEMOGLOBIN A1C: Hgb A1c MFr Bld: 5.8 % (ref 4.6–6.5)

## 2015-04-15 NOTE — Progress Notes (Signed)
Patient ID: Valerie [name], female   DOB: [birth year], 69 y.o.   MRN: [id number]   Reason for Appointment: Diabetes follow-up   History of Present Illness   Diagnosis: Type 2 DIABETES MELITUS, date of diagnosis: 1985      Previous history: She has been on insulin since her diagnosis and typically has had very labile blood sugars which are difficult to control evenly. She has been on various regimens including NPH, regular insulin and Lantus. She is usually requiring large doses of insulin and appears to do better with taking NPH rather than Lantus Also has been on Byetta which improves with postprandial control However has had a tendency to hypoglycemia fairly often especially after supper Her A1c generally tends to be lower than expected for her home readings    Because of difficulty getting her fasting readings down she was switched from Humalog to U-500 insulin in 2014  Recent history:  Insulin regimen:   NPH 50 am - between 40-60 units at 11 pm. U-500 insulin: 20 - 60 units before or after meals  U-500 doses: at breakfast  15-40; lunch  and supper 20-50  She has not been seen in follow-up for several months now However since her last visit she has lost a significant amount of weight from decreased appetite Has reduced her insulin doses accordingly She is continuing VICTOZA  instead of Byetta with which she is not having as frequent post prandial hypoglycemia in the evenings as before.   Current patterns and problems:   She is still taking very arbitrary doses of all the insulins [name] though she was instructed on dosage range for both her insulin types  She also cannot take her U-500 insulin  before eating as she forgets or she does not know how much he will eat at the time of injection   Variably increased blood sugars around breakfast time, sometimes well over 200; variability is likely related to her adjusting her NPH dose at bedtime based on the blood sugar at the time of  injection.  Also she will skip her NPH insulin completely of the blood sugar is in the normal range such as last night  She may have hypoglycemia overnight especially recently because she takes larger doses of NPH up to 60 units at bedtime when her blood sugars are high after supper  Blood sugars are usually fairly good during the night or low but trending higher before breakfast around 9-10 AM  Blood sugars are the lowest in the early afternoon and before lunch with only occasional high readings  HYPOGLYCEMIA: Recently occurring at night between 1-4 AM as above and also sporadically later in the day with the lowest reading of 46 at 8 PM  Blood sugars are variable after supper but not higher than average  Oral hypoglycemic drugs: None     Monitors blood glucose:  7.5 times a day   Glucometer: One Touch.  PRE-MEAL Breakfast  11 AM-3p.m.   5-7 PM   7-11 PM  Overall  Glucose range:  56-290   54-351   49-303   46-285   42-351   median:  180     113 +/-75    Overnight blood sugars range is 42-190  Meals: 2-3 meals per day.  has breakfast at 9 AM, supper at 5:30 p.m.    Exercise: None because of low back pain   Wt Readings from Last 3 Encounters:  04/15/15 288 lb 3.2 oz (130.727 kg)  09/27/14 302 lb 3.2  oz (137.077 kg)  08/28/14 306 lb 6.4 oz (138.982 kg)    LABS:  Lab Results  Component Value Date   HGBA1C 5.6 09/18/2013   Lab Results  Component Value Date   MICROALBUR 13.8* 09/18/2013   LDLCALC 46 03/07/2014   CREATININE 1.27* 03/07/2014       Medication List       This list is accurate as of: 04/15/15 12:45 PM.  Always use your most recent med list.               amLODipine 10 MG tablet  Commonly known as:  NORVASC  TAKE 1 TABLET BY MOUTH DAILY     atorvastatin 10 MG tablet  Commonly known as:  LIPITOR  TAKE 1 TABLET BY MOUTH EVERY DAY     cefdinir 300 MG capsule  Commonly known as:  OMNICEF  Take 300 mg by mouth 2 (two) times daily.     fenofibrate 160  MG tablet  TAKE 1 TABLET BY MOUTH DAILY     Fish Oil 1000 MG Caps  Take by mouth.     HUMULIN R 500 UNIT/ML Soln injection  Generic drug:  insulin regular human CONCENTRATED  INJECT 44 UNITS AS MARKED ON INSULIN SYRINGE THREE TIMES DAILY     hydrochlorothiazide 12.5 MG capsule  Commonly known as:  MICROZIDE  TAKE 1 CAPSULE BY MOUTH TWICE DAILY     insulin NPH Human 100 UNIT/ML injection  Commonly known as:  NOVOLIN N  Inject 45 units every morning and 70 units every evening.     Insulin Pen Needle 32G X 5 MM Misc  Commonly known as:  NOVOTWIST  Use to inject victoza daily     MYRBETRIQ 50 MG Tb24 tablet  Generic drug:  mirabegron ER  Take 50 mg by mouth daily.     omeprazole 20 MG capsule  Commonly known as:  PRILOSEC  Take 20 mg by mouth daily.     ONE TOUCH LANCETS Misc  Use to check blood sugars 8 times daily     ONE TOUCH ULTRA TEST test strip  Generic drug:  glucose blood  TEST 8 TIMES DAILY     solifenacin 10 MG tablet  Commonly known as:  VESICARE  Take 10 mg by mouth daily.     trimethoprim 100 MG tablet  Commonly known as:  TRIMPEX  Take 100 mg by mouth 2 (two) times daily.     TYLENOL EX ST ARTHRITIS PAIN 500 MG tablet  Generic drug:  acetaminophen  Take 500 mg by mouth every 6 (six) hours as needed.     VICTOZA 18 MG/3ML Sopn  Generic drug:  Liraglutide  INJECT 1.8 MG INTO THE SKIN AT THE SAME TIME EVERY DAY        Allergies: No Known Allergies  No past medical history on file.  No past surgical history on file.  No family history on file.  Social History:  reports that she has quit smoking. She does not have any smokeless tobacco history on file. Her alcohol and drug histories are not on file.  Review of Systems:   Hypertension:  she had hyperkalemia with lisinopril and is only on amlodipine and HCTZ  Lipids: She has had high triglycerides and LDL, on 2 drugs now including Lipitor  Lab Results  Component Value Date   CHOL 114  03/07/2014   HDL 37* 03/07/2014   LDLCALC 46 03/07/2014   LDLDIRECT 106.0 09/18/2013   TRIG 156* 03/07/2014  CHOLHDL 3.1 03/07/2014   Has a history of overactive bladder and is on treatment  She has had UTIs requiring various antibiotics based on sensitivity    She has mild numbness in her toes, foot exam done on 04/15/15   Examination:   BP 132/76 mmHg  Pulse 94  Temp(Src) 98.3 F (36.8 C)  Resp 16  Ht  (1.6 m)  Wt 288 lb 3.2 oz (130.727 kg)  BMI 51.07 kg/m2  SpO2 96%  Body mass index is 51.07 kg/(m^2).   Diabetic foot exam done today: Decreased monofilament sensation in the distal feet  Repeat blood pressure 132/76 with large cuff  ASSESSMENT/ PLAN:   Diabetes type 2  See history of present illness for detailed discussion of current blood sugar patterns, problems identified and insulin regimen  She is still having marked variability in her blood sugars throughout the day because of the type of insulin regimen she is taking as well as adjusting doses especially NPH inappropriately This is despite educating  her on timing and actions of both the types of insulin as well as the adjustment of basal insulin based on fasting or suppertime readings and mealtime insulin based on her meal size As discussed above her most variability is overnight since she is adjusting her bedtime NPH based on the blood sugar at bedtime rather than fasting reading Also she has much difficulty in getting consistent post prandial control because of adjusting her a meal that sugar based on the blood sugar level rather than what she is eating usually Overall the lowest blood sugars are in the early after known when she takes more insulin for her high fasting readings  Recommendations made today:  She will take a steady dose of 40 units of NPH at bedtime but adjust the dose based on fasting blood sugar trend over 3 days and have shown her how to do this on the flow sheet  Adjust the U-500 insulin  based on what she is eating and may take it right after eating for more accuracy  No change in morning NPH for now  If her breakfast blood sugars are still inconsistent may consider Toujeo insulin, discussed nature of this insulin and usage  Check A1c  NEUROPATHY: Discussed preventative measures for foot care  Hypertension: Well controlled  Hyperlipidemia: Needs repeat levels   Counseling time over 50% of today's 25 minute visit  Makynna Manocchio 04/15/2015, 12:45 PM

## 2015-04-16 LAB — MICROALBUMIN / CREATININE URINE RATIO
Creatinine,U: 100 mg/dL
Microalb Creat Ratio: 43.1 mg/g — ABNORMAL HIGH (ref 0.0–30.0)
Microalb, Ur: 43.1 mg/dL — ABNORMAL HIGH (ref 0.0–1.9)

## 2015-04-20 ENCOUNTER — Other Ambulatory Visit: Payer: Self-pay | Admitting: Endocrinology

## 2015-04-23 ENCOUNTER — Other Ambulatory Visit: Payer: Self-pay | Admitting: Endocrinology

## 2015-04-30 ENCOUNTER — Other Ambulatory Visit: Payer: Self-pay | Admitting: Endocrinology

## 2015-05-03 ENCOUNTER — Other Ambulatory Visit: Payer: Self-pay | Admitting: Endocrinology

## 2015-05-14 ENCOUNTER — Other Ambulatory Visit: Payer: Self-pay | Admitting: Endocrinology

## 2015-05-30 ENCOUNTER — Other Ambulatory Visit: Payer: Self-pay | Admitting: Endocrinology

## 2015-06-29 ENCOUNTER — Other Ambulatory Visit: Payer: Self-pay | Admitting: Endocrinology

## 2015-07-02 ENCOUNTER — Other Ambulatory Visit: Payer: Self-pay | Admitting: *Deleted

## 2015-07-02 MED ORDER — INSULIN REGULAR HUMAN (CONC) 500 UNIT/ML ~~LOC~~ SOLN: SUBCUTANEOUS | Status: DC

## 2015-07-15 ENCOUNTER — Ambulatory Visit: Payer: Self-pay | Admitting: Endocrinology

## 2015-07-25 ENCOUNTER — Other Ambulatory Visit: Payer: Self-pay | Admitting: Endocrinology

## 2015-07-29 ENCOUNTER — Other Ambulatory Visit: Payer: Self-pay | Admitting: Endocrinology

## 2015-08-13 ENCOUNTER — Ambulatory Visit: Payer: Self-pay | Admitting: Endocrinology

## 2015-08-28 ENCOUNTER — Other Ambulatory Visit: Payer: Self-pay | Admitting: Endocrinology

## 2015-09-12 ENCOUNTER — Other Ambulatory Visit: Payer: Self-pay | Admitting: Endocrinology

## 2015-09-16 ENCOUNTER — Ambulatory Visit: Payer: Self-pay | Admitting: Endocrinology

## 2015-09-20 ENCOUNTER — Encounter: Payer: Self-pay | Admitting: Endocrinology

## 2015-09-20 ENCOUNTER — Ambulatory Visit (INDEPENDENT_AMBULATORY_CARE_PROVIDER_SITE_OTHER): Payer: Medicare Other | Admitting: Endocrinology

## 2015-09-20 VITALS — BP 146/80 | HR 85 | Temp 98.3°F | Resp 16 | Ht 63.0 in | Wt 291.0 lb

## 2015-09-20 DIAGNOSIS — I10 Essential (primary) hypertension: Secondary | ICD-10-CM

## 2015-09-20 DIAGNOSIS — Z23 Encounter for immunization: Secondary | ICD-10-CM | POA: Diagnosis not present

## 2015-09-20 DIAGNOSIS — E1165 Type 2 diabetes mellitus with hyperglycemia: Secondary | ICD-10-CM | POA: Diagnosis not present

## 2015-09-20 DIAGNOSIS — IMO0002 Reserved for concepts with insufficient information to code with codable children: Secondary | ICD-10-CM

## 2015-09-20 DIAGNOSIS — E785 Hyperlipidemia, unspecified: Secondary | ICD-10-CM

## 2015-09-20 LAB — COMPREHENSIVE METABOLIC PANEL
ALT: 10 U/L (ref 0–35)
AST: 15 U/L (ref 0–37)
Albumin: 4.1 g/dL (ref 3.5–5.2)
Alkaline Phosphatase: 48 U/L (ref 39–117)
BILIRUBIN TOTAL: 0.4 mg/dL (ref 0.2–1.2)
BUN: 39 mg/dL — AB (ref 6–23)
CALCIUM: 10.1 mg/dL (ref 8.4–10.5)
CHLORIDE: 104 meq/L (ref 96–112)
CO2: 26 meq/L (ref 19–32)
CREATININE: 1.65 mg/dL — AB (ref 0.40–1.20)
GFR: 32.59 mL/min — ABNORMAL LOW (ref >60.00)
GLUCOSE: 73 mg/dL (ref 70–99)
Potassium: 4.8 mEq/L (ref 3.5–5.1)
SODIUM: 142 meq/L (ref 135–145)
Total Protein: 7.2 g/dL (ref 6.0–8.3)

## 2015-09-20 LAB — TSH: TSH: 2.08 u[IU]/mL (ref 0.35–4.50)

## 2015-09-20 LAB — POCT GLYCOSYLATED HEMOGLOBIN (HGB A1C): Hemoglobin A1C: 5.5

## 2015-09-20 LAB — T4, FREE: Free T4: 1.15 ng/dL (ref 0.60–1.60)

## 2015-09-20 MED ORDER — METFORMIN HCL ER 500 MG PO TB24: 1500.0000 mg | ORAL_TABLET | Freq: Every day | ORAL | Status: DC

## 2015-09-20 NOTE — Progress Notes (Addendum)
Patient ID: Valerie [name], female   DOB: 11-19-1946, 69 y.o.   MRN: [id number]   Reason for Appointment: Diabetes follow-up   History of Present Illness   Diagnosis: Type 2 DIABETES MELITUS, date of diagnosis: 1985      Previous history: She has been on insulin since her diagnosis and typically has had very labile blood sugars which are difficult to control evenly. She has been on various regimens including NPH, regular insulin and Lantus. She is usually requiring large doses of insulin and appears to do better with taking NPH rather than Lantus Also has been on Byetta which improves with postprandial control However has had a tendency to hypoglycemia fairly often especially after supper Her A1c generally tends to be lower than expected for her home readings    Because of difficulty getting her fasting readings down she was switched from Humalog to U-500 insulin in 2014  Recent history:  Insulin regimen:    U-500 insulin: 4-20  units before or after meals  She has not been seen in follow-up for several months again However since her last visit she has needed much less insulin than before and she does not know why. Has reduced her insulin doses accordingly and stopped her NPH insulin She is not continuing VICTOZA as she thought it may be causing her kidney problems and did not think her blood sugars went up with stopping it  Current patterns and problems:   She is still taking very arbitrary doses of her U-500 insulin based mostly on her blood sugar level at mealtime rather than what she is eating.  However when her sugars are normal she takes only about 4-5 units but when her sugars are even over 150 she will take as much as 15 units  HYPOGLYCEMIA: He is still having frequent hypoglycemia and 33% of her readings are below 70.  She has a most frequent hypoglycemia overnight recently and generally around 3-4 AM including last night at 5 AM when her husband called paramedics  because of significantly low sugar  She will also have some low blood sugars around 2 PM and around 11 PM periodically  She has only occasional spikes in blood sugars after meals throughout the day  Has only a couple of high readings during the night possibly preceded by low sugars in the evening before and last night she took 20 units of insulin when her sugar was 287 causing hypoglycemia.  She still does not want to take her insulin before eating and will take it after eating.   Oral hypoglycemic drugs: None     Monitors blood glucose:  9-10 times a day   Glucometer: One Touch.   Mean values apply above for all meters except median for One Touch  PRE-MEAL Fasting Lunch Dinner Bedtime Overall  Glucose range:  48-280   50-330   43-314   50-218   32-330   Mean/median:  105   147   89   78   91+/-67    Overnight blood sugars range is 32-287 with median 93  Meals: 2-3 meals per day.  has breakfast at 9 AM, supper at 5:30 p.m.    Exercise: None because of low back pain   Wt Readings from Last 3 Encounters:  09/20/15 291 lb (131.997 kg)  04/15/15 288 lb 3.2 oz (130.727 kg)  09/27/14 302 lb 3.2 oz (137.077 kg)    LABS:  Lab Results  Component Value Date   HGBA1C 5.5 09/20/2015  HGBA1C 5.8 04/15/2015   HGBA1C 5.6 09/18/2013   Lab Results  Component Value Date   MICROALBUR 43.1* 04/15/2015   LDLCALC 46 03/07/2014   CREATININE 1.34* 04/15/2015       Medication List       This list is accurate as of: 09/20/15 10:24 AM.  Always use your most recent med list.               amLODipine 10 MG tablet  Commonly known as:  NORVASC  TAKE ONE TABLET BY MOUTH EVERY DAY     atorvastatin 10 MG tablet  Commonly known as:  LIPITOR  TAKE 1 TABLET BY MOUTH DAILY     cefdinir 300 MG capsule  Commonly known as:  OMNICEF  Take 300 mg by mouth 2 (two) times daily.     fenofibrate 160 MG tablet  TAKE 1 TABLET BY MOUTH DAILY     Fish Oil 1000 MG Caps  Take by mouth.      hydrochlorothiazide 12.5 MG capsule  Commonly known as:  MICROZIDE  TAKE 1 CAPSULE BY MOUTH TWICE DAILY     Insulin Pen Needle 32G X 5 MM Misc  Commonly known as:  NOVOTWIST  Use to inject victoza daily     insulin regular human CONCENTRATED 500 UNIT/ML injection  Commonly known as:  HUMULIN R  INJECT 44 UNITS AS MARKED ON THE INSULIN SYRINGE UNDER THE SKIN THREE TIMES DAILY     metFORMIN 500 MG 24 hr tablet  Commonly known as:  GLUCOPHAGE-XR  Take 3 tablets (1,500 mg total) by mouth daily with supper.     MYRBETRIQ 50 MG Tb24 tablet  Generic drug:  mirabegron ER  Take 50 mg by mouth daily.     omeprazole 20 MG capsule  Commonly known as:  PRILOSEC  Take 20 mg by mouth daily.     ONE TOUCH LANCETS Misc  Use to check blood sugars 8 times daily     ONE TOUCH ULTRA TEST test strip  Generic drug:  glucose blood  TEST EIGHT TIMES DAILY     solifenacin 10 MG tablet  Commonly known as:  VESICARE  Take 10 mg by mouth daily.     trimethoprim 100 MG tablet  Commonly known as:  TRIMPEX  Take 100 mg by mouth 2 (two) times daily.     TYLENOL EX ST ARTHRITIS PAIN 500 MG tablet  Generic drug:  acetaminophen  Take 500 mg by mouth every 6 (six) hours as needed.        Allergies: No Known Allergies  No past medical history on file.  No past surgical history on file.  No family history on file.  Social History:  reports that she has quit smoking. She does not have any smokeless tobacco history on file. Her alcohol and drug histories are not on file.  Review of Systems:   Hypertension:  she had hyperkalemia with lisinopril and is only on amlodipine and HCTZ  Lipids: She has had high triglycerides and LDL, on 2 drugs now including Lipitor  Lab Results  Component Value Date   CHOL 146 04/15/2015   HDL 38.20* 04/15/2015   LDLCALC 46 03/07/2014   LDLDIRECT 71.0 04/15/2015   TRIG 292.0* 04/15/2015   CHOLHDL 4 04/15/2015   Has a history of overactive bladder and is on  treatment  She has had UTIs requiring various antibiotics based on sensitivity    She has mild numbness in her toes, foot exam done  on 04/15/15 which showed Decreased monofilament sensation in the distal feet   Examination:   BP 146/80 mmHg  Pulse 85  Temp(Src) 98.3 F (36.8 C)  Resp 16  Ht  (1.6 m)  Wt 291 lb (131.997 kg)  BMI 51.56 kg/m2  SpO2 97%  Body mass index is 51.56 kg/(m^2).      ASSESSMENT/ PLAN:   Diabetes type 2  See history of present illness for detailed discussion of current blood sugar patterns, problems identified and insulin regimen  She is now showing marked sensitivity to insulin compared to previous treatment program and not clear why. She is also still having frequent hypoglycemia which is frequently delayed because of her using the U-500 insulin As discussed above she is taking very arbitrary doses of insulin and usually right after eating instead of before  Blood sugars do not show any consistent pattern and she has tendency to low blood sugar throughout the day as well as blood sugar spikes at various times either from rebound or from not taking insulin early enough She has has the most frequent sugars overnight when she is having a peak of the U-500 insulin taken the evening before Does not appear to have any change in insulin sensitivity with stopping Victoza and no consistent posterior and lateral glycemia either  Recommendations made today:  She will take smaller doses of insulin for meals and no more than 10 units at suppertime.  She will start metformin for insulin sensitivity and hopefully can stop her suppertime insulin completely  If the metformin is helpful may consider adding Victoza and stopping insulin completely  She should do better with taking basal bolus insulin regimen which will give more flexibility for mealtime coverage but she wants to use up her old insulin first  Check renal function and TSH to rule out significant  causes of hypoglycemia  Advised her to not be over aggressive with making her blood sugar down as her A1c is quite normal and she is not having significant diabetes complications recently  Hypertension: Well controlled  Hyperlipidemia: Previously well controlled  Influenza vaccine given Counseling time on subjects discussed above is over 50% of today's 25 minute visit    KUMAR,AJAY 09/20/2015, 10:24 AM

## 2015-09-20 NOTE — Patient Instructions (Addendum)
No more than 10 units for supper dose, start if am sugars stay >140  Start taking Metformin 500 mg, 1 tablet with your main meal for 5 days. Occasionally this may initially cause loose stools or nausea. If  tolerating well after 5 days add a second Metformin tablet (500 mg) at the same time. Continue adding another tablet after 5 days days if no persistent nausea or diarrhea until reaching the maximum tolerated dose or the full dose of 4 tablets once daily.

## 2015-09-21 LAB — FRUCTOSAMINE: FRUCTOSAMINE: 233 umol/L (ref 0–285)

## 2015-09-22 NOTE — Progress Notes (Signed)
Quick Note:  Please let patient know that the kidney test is slightly worse, needs to discuss with PCP, please fax, need updated PCP name ______

## 2015-09-23 ENCOUNTER — Telehealth: Payer: Self-pay

## 2015-09-23 NOTE — Telephone Encounter (Signed)
Pt stated that she is disappointed with the way her office visits go with Dr.Kumar.He does not listen to her chest nor does he check her eyes and feet.Pt also stated that her last visit was about low blood sugars and he did not listen to her and still put her on a low blood sugar pill.

## 2015-10-20 ENCOUNTER — Other Ambulatory Visit: Payer: Self-pay | Admitting: Endocrinology

## 2015-10-29 ENCOUNTER — Other Ambulatory Visit: Payer: Self-pay

## 2015-10-29 MED ORDER — HYDROCHLOROTHIAZIDE 12.5 MG PO CAPS: 12.5000 mg | ORAL_CAPSULE | Freq: Two times a day (BID) | ORAL | Status: DC

## 2015-11-15 ENCOUNTER — Other Ambulatory Visit: Payer: Self-pay | Admitting: *Deleted

## 2015-11-15 MED ORDER — GLUCOSE BLOOD VI STRP: ORAL_STRIP | Status: DC

## 2015-12-07 ENCOUNTER — Emergency Department (HOSPITAL_COMMUNITY): Payer: Medicare Other

## 2015-12-07 ENCOUNTER — Emergency Department (HOSPITAL_COMMUNITY)
Admission: EM | Admit: 2015-12-07 | Discharge: 2015-12-07 | Disposition: A | Discharge status: Home / Self Care | Payer: Medicare Other | Attending: Physician Assistant | Admitting: Physician Assistant

## 2015-12-07 ENCOUNTER — Encounter (HOSPITAL_COMMUNITY): Payer: Self-pay | Admitting: *Deleted

## 2015-12-07 ENCOUNTER — Emergency Department (EMERGENCY_DEPARTMENT_HOSPITAL)
Admit: 2015-12-07 | Discharge: 2015-12-07 | Disposition: A | Discharge status: Home / Self Care | Payer: Medicare Other | Attending: Emergency Medicine | Admitting: Emergency Medicine

## 2015-12-07 DIAGNOSIS — E119 Type 2 diabetes mellitus without complications: Secondary | ICD-10-CM | POA: Diagnosis not present

## 2015-12-07 DIAGNOSIS — M7989 Other specified soft tissue disorders: Secondary | ICD-10-CM

## 2015-12-07 DIAGNOSIS — E875 Hyperkalemia: Secondary | ICD-10-CM | POA: Diagnosis not present

## 2015-12-07 DIAGNOSIS — Z79899 Other long term (current) drug therapy: Secondary | ICD-10-CM | POA: Diagnosis not present

## 2015-12-07 DIAGNOSIS — I10 Essential (primary) hypertension: Secondary | ICD-10-CM | POA: Insufficient documentation

## 2015-12-07 DIAGNOSIS — L03116 Cellulitis of left lower limb: Secondary | ICD-10-CM | POA: Diagnosis not present

## 2015-12-07 DIAGNOSIS — M79609 Pain in unspecified limb: Secondary | ICD-10-CM | POA: Diagnosis not present

## 2015-12-07 DIAGNOSIS — Z794 Long term (current) use of insulin: Secondary | ICD-10-CM | POA: Diagnosis not present

## 2015-12-07 DIAGNOSIS — Z87891 Personal history of nicotine dependence: Secondary | ICD-10-CM | POA: Insufficient documentation

## 2015-12-07 DIAGNOSIS — Z7984 Long term (current) use of oral hypoglycemic drugs: Secondary | ICD-10-CM | POA: Insufficient documentation

## 2015-12-07 DIAGNOSIS — M79675 Pain in left toe(s): Secondary | ICD-10-CM | POA: Diagnosis present

## 2015-12-07 DIAGNOSIS — R05 Cough: Secondary | ICD-10-CM | POA: Insufficient documentation

## 2015-12-07 DIAGNOSIS — Z792 Long term (current) use of antibiotics: Secondary | ICD-10-CM | POA: Insufficient documentation

## 2015-12-07 DIAGNOSIS — E785 Hyperlipidemia, unspecified: Secondary | ICD-10-CM | POA: Diagnosis not present

## 2015-12-07 HISTORY — DX: Hyperlipidemia, unspecified: E78.5

## 2015-12-07 LAB — CBC WITH DIFFERENTIAL/PLATELET
Basophils Absolute: 0 10*3/uL (ref 0.0–0.1)
Basophils Relative: 0 %
EOS ABS: 0.1 10*3/uL (ref 0.0–0.7)
Eosinophils Relative: 1 %
HCT: 34.3 % — ABNORMAL LOW (ref 36.0–46.0)
HEMOGLOBIN: 10.9 g/dL — AB (ref 12.0–15.0)
LYMPHS ABS: 0.6 10*3/uL — AB (ref 0.7–4.0)
LYMPHS PCT: 6 %
MCH: 29.1 pg (ref 26.0–34.0)
MCHC: 31.8 g/dL (ref 30.0–36.0)
MCV: 91.5 fL (ref 78.0–100.0)
Monocytes Absolute: 0.8 10*3/uL (ref 0.1–1.0)
Monocytes Relative: 8 %
NEUTROS ABS: 8.5 10*3/uL — AB (ref 1.7–7.7)
NEUTROS PCT: 85 %
Platelets: 233 10*3/uL (ref 150–400)
RBC: 3.75 MIL/uL — AB (ref 3.87–5.11)
RDW: 14.2 % (ref 11.5–15.5)
WBC: 10 10*3/uL (ref 4.0–10.5)

## 2015-12-07 LAB — COMPREHENSIVE METABOLIC PANEL
ALT: 16 U/L (ref 14–54)
AST: 20 U/L (ref 15–41)
Albumin: 3 g/dL — ABNORMAL LOW (ref 3.5–5.0)
Alkaline Phosphatase: 58 U/L (ref 38–126)
Anion gap: 14 (ref 5–15)
BUN: 46 mg/dL — ABNORMAL HIGH (ref 6–20)
CHLORIDE: 105 mmol/L (ref 101–111)
CO2: 20 mmol/L — AB (ref 22–32)
CREATININE: 1.85 mg/dL — AB (ref 0.44–1.00)
Calcium: 9.4 mg/dL (ref 8.9–10.3)
GFR, EST AFRICAN AMERICAN: 30 mL/min — AB (ref >60)
GFR, EST NON AFRICAN AMERICAN: 26 mL/min — AB (ref >60)
Glucose, Bld: 225 mg/dL — ABNORMAL HIGH (ref 65–99)
POTASSIUM: 5.2 mmol/L — AB (ref 3.5–5.1)
SODIUM: 139 mmol/L (ref 135–145)
Total Bilirubin: 0.7 mg/dL (ref 0.3–1.2)
Total Protein: 6.7 g/dL (ref 6.5–8.1)

## 2015-12-07 MED ORDER — SODIUM CHLORIDE 0.9 % IV BOLUS (SEPSIS): 1000.0000 mL | Freq: Once | INTRAVENOUS | Status: DC

## 2015-12-07 MED ORDER — SULFAMETHOXAZOLE-TRIMETHOPRIM 800-160 MG PO TABS: 1.0000 | ORAL_TABLET | Freq: Two times a day (BID) | ORAL | Status: DC

## 2015-12-07 MED ORDER — SULFAMETHOXAZOLE-TRIMETHOPRIM 800-160 MG PO TABS: 1.0000 | ORAL_TABLET | Freq: Two times a day (BID) | ORAL | Status: AC

## 2015-12-07 MED ORDER — SULFAMETHOXAZOLE-TRIMETHOPRIM 800-160 MG PO TABS
1.0000 | ORAL_TABLET | Freq: Once | ORAL | Status: AC
  Administered 2015-12-07: 1 via ORAL
  Filled 2015-12-07: qty 1

## 2015-12-07 NOTE — ED Notes (Signed)
Pt states she had a new pair of shoes that rubbed a small blister onto to left foot second toe. Pt states a few days later she had purulent drainage coming from toe and on Thursday noted left leg swelling and redness.

## 2015-12-07 NOTE — ED Notes (Signed)
Pt is in stable condition upon d/c and is escorted from ED via wheelchair. 

## 2015-12-07 NOTE — ED Notes (Signed)
Patient transported to X-ray 

## 2015-12-07 NOTE — ED Notes (Signed)
Patient transported to Ultrasound 

## 2015-12-07 NOTE — ED Notes (Signed)
Unable to gain IV access. Pt refuses to let RN use hands. MD made aware. Pt will drink 1L in fluid.

## 2015-12-07 NOTE — ED Notes (Signed)
Multiple people have attempted to draw blood on pt with no success. Pt refuses to let staff stick anywhere but her AC. PA informed.

## 2015-12-07 NOTE — Discharge Instructions (Signed)
Medications: Bactrim is your antibiotic - Please take all of your antibiotics until finished!, continue usual home medications Rest, drink plenty of fluids, It is of the utmost importance that you maintain strict glycemic control and stay well hydrated! Please follow up with your primary doctor in 3-5 days for discussion of your diagnoses and further evaluation after today's visit; Please return to the ER for any new or worsening symptoms, any additional concerns, or if leg is not improving after 3-4 days.    Cellulitis Cellulitis is an infection of the skin and the tissue beneath it. The infected area is usually red and tender. Cellulitis occurs most often in the arms and lower legs.  CAUSES  Cellulitis is caused by bacteria that enter the skin through cracks or cuts in the skin. The most common types of bacteria that cause cellulitis are staphylococci and streptococci. SIGNS AND SYMPTOMS   Redness and warmth.  Swelling.  Tenderness or pain.  Fever. DIAGNOSIS  Your health care provider can usually determine what is wrong based on a physical exam. Blood tests may also be done. TREATMENT  Treatment usually involves taking an antibiotic medicine. HOME CARE INSTRUCTIONS   Take your antibiotic medicine as directed by your health care provider. Finish the antibiotic even if you start to feel better.  Keep the infected arm or leg elevated to reduce swelling.  Apply a warm cloth to the affected area up to 4 times per day to relieve pain.  Take medicines only as directed by your health care provider.  Keep all follow-up visits as directed by your health care provider. SEEK MEDICAL CARE IF:   You notice red streaks coming from the infected area.  Your red area gets larger or turns dark in color.  Your bone or joint underneath the infected area becomes painful after the skin has healed.  Your infection returns in the same area or another area.  You notice a swollen bump in the  infected area.  You develop new symptoms.  You have a fever. SEEK IMMEDIATE MEDICAL CARE IF:   You feel very sleepy.  You develop vomiting or diarrhea.  You have a general ill feeling (malaise) with muscle aches and pains.   This information is not intended to replace advice given to you by your health care provider. Make sure you discuss any questions you have with your health care provider.   Document Released: 09/23/2005 Document Revised: 09/04/2015 Document Reviewed: 02/29/2012 Elsevier Interactive Patient Education [employer]2016 [employer]

## 2015-12-07 NOTE — Progress Notes (Signed)
VASCULAR LAB PRELIMINARY  PRELIMINARY  PRELIMINARY  PRELIMINARY  Left lower extremity venous duplex completed.    Preliminary report:  There is no DVT or SVT noted in the left lower extremity.  Kelena Garrow, RVT 12/07/2015, 11:50 AM

## 2015-12-07 NOTE — ED Provider Notes (Signed)
CSN: [id number]     Arrival date & time 12/07/15  1021 History   First MD Initiated Contact with Patient 12/07/15 1024     Chief Complaint  Patient presents with  . Toe Pain     (Consider location/radiation/quality/duration/timing/severity/associated sxs/prior Treatment) Patient is a 69 y.o. female presenting with toe pain. The history is provided by the patient and medical records. No language interpreter was used.  Toe Pain Associated symptoms include coughing. Pertinent negatives include no arthralgias, chest pain, congestion, headaches, myalgias, nausea, neck pain, rash, sore throat, vomiting or weakness.    Valerie [name] is a 69 y.o. female  with a PMH of uncontrolled DM, HTN, HLD who presents to the Emergency Department complaining of toe sore and leg swelling. Patient states she had a blister on her left second toe 2/2 new pair of shoes, then ~ 1 week ago, noticed a pus pocket with surrounding erythema. Pt. Expressed purulent drainage from the site and continued to keep area covered. Thursday (2 days ago), she noticed unilateral left-sided leg swelling. Pain only with palpation or bearing weight. Denies fever, chest pain, sob. Pt. Admits to decreased sensation in lower extremities 2/2 DM.    Past Medical History  Diagnosis Date  . Diabetes mellitus without complication (HCC)   . Hypertension   . Hyperlipidemia    Past Surgical History  Procedure Laterality Date  . Pancreas surgery  1985   History reviewed. No pertinent family history. Social History  Substance Use Topics  . Smoking status: Former Games developer  . Smokeless tobacco: None  . Alcohol Use: No   OB History    No data available     Review of Systems  Constitutional: Negative.   HENT: Negative for congestion, rhinorrhea and sore throat.   Eyes: Negative for visual disturbance.  Respiratory: Positive for cough. Negative for shortness of breath and wheezing.   Cardiovascular: Positive for leg swelling. Negative  for chest pain and palpitations.  Gastrointestinal: Negative for nausea, vomiting, diarrhea and constipation.  Endocrine: Negative for polydipsia and polyuria.  Musculoskeletal: Negative for myalgias, back pain, arthralgias and neck pain.  Skin: Negative for rash.  Neurological: Negative for dizziness, weakness and headaches.      Allergies  Review of patient's allergies indicates no known allergies.  Home Medications   Prior to Admission medications   Medication Sig Start Date End Date Taking? Authorizing Provider  acetaminophen (TYLENOL EX ST ARTHRITIS PAIN) 500 MG tablet Take 500 mg by mouth every 6 (six) hours as needed.   Yes Historical Provider, MD  amLODipine (NORVASC) 10 MG tablet TAKE 1 TABLET BY MOUTH EVERY DAY 10/21/15  Yes Reather Littler, MD  atorvastatin (LIPITOR) 10 MG tablet TAKE 1 TABLET BY MOUTH DAILY 09/12/15  Yes Reather Littler, MD  CRANBERRY PO Take 1 tablet by mouth 2 (two) times daily.   Yes Historical Provider, MD  fenofibrate 160 MG tablet TAKE 1 TABLET BY MOUTH DAILY 10/21/15  Yes Reather Littler, MD  glucose blood (ONE TOUCH ULTRA TEST) test strip TEST EIGHT TIMES DAILY 11/15/15  Yes Reather Littler, MD  hydrochlorothiazide (MICROZIDE) 12.5 MG capsule Take 1 capsule (12.5 mg total) by mouth 2 (two) times daily. 10/29/15  Yes Reather Littler, MD  Insulin Pen Needle (NOVOTWIST) 32G X 5 MM MISC Use to inject victoza daily 02/07/15  Yes Reather Littler, MD  insulin regular human CONCENTRATED (HUMULIN R) 500 UNIT/ML injection INJECT 44 UNITS AS MARKED ON THE INSULIN SYRINGE UNDER THE SKIN THREE TIMES DAILY Patient taking  differently: INJECT 20 UNITS AS MARKED ON THE INSULIN SYRINGE UNDER THE SKIN THREE TIMES DAILY 07/02/15  Yes Reather LittlerAjay Kumar, MD  mirabegron ER (MYRBETRIQ) 50 MG TB24 tablet Take 50 mg by mouth daily.   Yes Historical Provider, MD  nitrofurantoin (MACRODANTIN) 50 MG capsule Take 50 mg by mouth 4 (four) times daily.   Yes Historical Provider, MD  Omega-3 Fatty Acids (FISH OIL) 1000 MG  CAPS Take by mouth.   Yes Historical Provider, MD  omeprazole (PRILOSEC) 20 MG capsule Take 20 mg by mouth daily.   Yes Historical Provider, MD  ONE TOUCH LANCETS MISC Use to check blood sugars 8 times daily 10/16/13  Yes Reather LittlerAjay Kumar, MD  solifenacin (VESICARE) 10 MG tablet Take 10 mg by mouth daily.   Yes Historical Provider, MD  trimethoprim (TRIMPEX) 100 MG tablet Take 100 mg by mouth daily.    Yes Historical Provider, MD  cefdinir (OMNICEF) 300 MG capsule Take 300 mg by mouth 2 (two) times daily.    Historical Provider, MD  metFORMIN (GLUCOPHAGE-XR) 500 MG 24 hr tablet Take 3 tablets (1,500 mg total) by mouth daily with supper. 09/20/15   Reather LittlerAjay Kumar, MD  sulfamethoxazole-trimethoprim (BACTRIM DS,SEPTRA DS) 800-160 MG tablet Take 1 tablet by mouth 2 (two) times daily. 12/07/15 12/14/15  Delight Bickle Pilcher Mycah Formica, PA-C   BP 127/65 mmHg  Pulse 94  Temp(Src) 98.7 F (37.1 C) (Oral)  Resp 22  SpO2 96% Physical Exam  Constitutional: She is oriented to person, place, and time. She appears well-developed and well-nourished.  Alert and in no acute distress  HENT:  Head: Normocephalic and atraumatic.  Cardiovascular: Normal rate, regular rhythm, normal heart sounds and intact distal pulses.  Exam reveals no gallop and no friction rub.   No murmur heard. Pulmonary/Chest: Effort normal and breath sounds normal. No respiratory distress. She has no wheezes. She has no rales. She exhibits no tenderness.  Abdominal: She exhibits no mass. There is no rebound and no guarding.  Abdomen soft, non-tender, non-distended Bowel sounds positive in all four quadrants  Musculoskeletal:       Legs: 5/5 muscle strength in bilateral lower extremities. TTP as depicted in image.  Open blister to left 2nd digit- entire toe with erythema. +2 edema of L lower extremity; (-) edema of R LE L LE warmth > R  Neurological: She is alert and oriented to person, place, and time.  Sensory of bilateral LE's intact, but decreased    Skin: Skin is warm and dry. No rash noted.  Psychiatric: She has a normal mood and affect. Her behavior is normal. Judgment and thought content normal.  Nursing note and vitals reviewed.   ED Course  Procedures (including critical care time) Labs Review Labs Reviewed  CBC WITH DIFFERENTIAL/PLATELET - Abnormal; Notable for the following:    RBC 3.75 (*)    Hemoglobin 10.9 (*)    HCT 34.3 (*)    Neutro Abs 8.5 (*)    Lymphs Abs 0.6 (*)    All other components within normal limits  COMPREHENSIVE METABOLIC PANEL - Abnormal; Notable for the following:    Potassium 5.2 (*)    CO2 20 (*)    Glucose, Bld 225 (*)    BUN 46 (*)    Creatinine, Ser 1.85 (*)    Albumin 3.0 (*)    GFR calc non Af Amer 26 (*)    GFR calc Af Amer 30 (*)    All other components within normal limits  I-STAT CHEM  8, ED    Imaging Review Dg Foot Complete Left  12/07/2015  CLINICAL DATA:  Per patient for about 1 week 1/2 her left foot has been rubbing against the end side of her shoe. Patient states she now has an infection of her 2nd toe of her left foot. Patient has an open wound on the dorsal surface of her 2nd toe. * EXAM: LEFT FOOT - COMPLETE 3+ VIEW COMPARISON:  None. FINDINGS: No osseous erosion of the distal phalanges. Particular attention directed to the second digit. There is no soft tissue abnormality evident. There is no radiodense foreign body. IMPRESSION: No evidence of osteomyelitis or foreign body. Electronically Signed   By: Genevive Bi M.D.   On: 12/07/2015 11:42   I have personally reviewed and evaluated these images and lab results as part of my medical decision-making.   EKG Interpretation None      MDM   Final diagnoses:  Hyperkalemia  Cellulitis of left lower extremity   Vanessa  presents with unilateral leg swelling and infection of L 2nd toe.   Labs: CBC with nl white count, Ab neutro 8.5, CMP with K+ of 5.2  Imaging: x-ray shows no evidence of osteomyelitis; U/S with  no signs of DVT or SVT  Discharge Meds:   A&P:  Cellulitis of left lower extremity  - Bactrim: first dose given here x 10 days  - Follow up with PCP  - Stressed importance of glycemic control for healing.   Hyperkalemia, renal insufficiency: patient has had 1L of PO fluids while in ED. Patient refused chem-8 lab stick to recheck labs. K+ was only mildly elevated, explained risks and benefits of checking labs again. Patient still refused lab recheck and will be dc'd to home with strict return precautions and instructions to stay hydrated and f/up with PCP.   Patient seen by and discussed with Dr. Corlis Leak who agrees with treatment plan.    Chase Picket Kristian Mogg, PA-C 12/07/15 1551  Courteney Randall An, MD 12/07/15 1625

## 2015-12-25 ENCOUNTER — Telehealth: Payer: Self-pay | Admitting: Endocrinology

## 2015-12-25 NOTE — Telephone Encounter (Signed)
Patient stated that insurance [employer] will not cover the Novolin N but will cover the  Humulin N, please advise

## 2015-12-26 NOTE — Telephone Encounter (Signed)
I spoke with [name], she did not need a refill, but just wanted to give us notice that her insurance is changing her medication.   I advised her to call back when she needed a refill of the new medication.

## 2015-12-29 DIAGNOSIS — E041 Nontoxic single thyroid nodule: Secondary | ICD-10-CM

## 2015-12-29 DIAGNOSIS — N1330 Unspecified hydronephrosis: Secondary | ICD-10-CM

## 2015-12-29 HISTORY — DX: Nontoxic single thyroid nodule: E04.1

## 2015-12-29 HISTORY — DX: Unspecified hydronephrosis: N13.30

## 2016-01-05 ENCOUNTER — Encounter (HOSPITAL_COMMUNITY): Payer: Self-pay

## 2016-01-05 ENCOUNTER — Emergency Department (HOSPITAL_COMMUNITY)
Admission: EM | Admit: 2016-01-05 | Discharge: 2016-01-05 | Disposition: A | Discharge status: Home / Self Care | Payer: Medicare Other | Source: Home / Self Care | Attending: Emergency Medicine | Admitting: Emergency Medicine

## 2016-01-05 DIAGNOSIS — Z87891 Personal history of nicotine dependence: Secondary | ICD-10-CM | POA: Insufficient documentation

## 2016-01-05 DIAGNOSIS — R112 Nausea with vomiting, unspecified: Secondary | ICD-10-CM | POA: Insufficient documentation

## 2016-01-05 DIAGNOSIS — E785 Hyperlipidemia, unspecified: Secondary | ICD-10-CM

## 2016-01-05 DIAGNOSIS — E119 Type 2 diabetes mellitus without complications: Secondary | ICD-10-CM | POA: Insufficient documentation

## 2016-01-05 DIAGNOSIS — K420 Umbilical hernia with obstruction, without gangrene: Secondary | ICD-10-CM | POA: Diagnosis not present

## 2016-01-05 DIAGNOSIS — N390 Urinary tract infection, site not specified: Secondary | ICD-10-CM

## 2016-01-05 DIAGNOSIS — Z794 Long term (current) use of insulin: Secondary | ICD-10-CM | POA: Insufficient documentation

## 2016-01-05 DIAGNOSIS — Z79899 Other long term (current) drug therapy: Secondary | ICD-10-CM

## 2016-01-05 DIAGNOSIS — I10 Essential (primary) hypertension: Secondary | ICD-10-CM | POA: Insufficient documentation

## 2016-01-05 LAB — COMPREHENSIVE METABOLIC PANEL
ALBUMIN: 3.2 g/dL — AB (ref 3.5–5.0)
ALK PHOS: 52 U/L (ref 38–126)
ALT: 13 U/L — ABNORMAL LOW (ref 14–54)
ANION GAP: 17 — AB (ref 5–15)
AST: 16 U/L (ref 15–41)
BILIRUBIN TOTAL: 0.9 mg/dL (ref 0.3–1.2)
BUN: 76 mg/dL — ABNORMAL HIGH (ref 6–20)
CALCIUM: 9.6 mg/dL (ref 8.9–10.3)
CO2: 21 mmol/L — ABNORMAL LOW (ref 22–32)
Chloride: 96 mmol/L — ABNORMAL LOW (ref 101–111)
Creatinine, Ser: 1.63 mg/dL — ABNORMAL HIGH (ref 0.44–1.00)
GFR, EST AFRICAN AMERICAN: 36 mL/min — AB (ref >60)
GFR, EST NON AFRICAN AMERICAN: 31 mL/min — AB (ref >60)
Glucose, Bld: 297 mg/dL — ABNORMAL HIGH (ref 65–99)
POTASSIUM: 4.6 mmol/L (ref 3.5–5.1)
Sodium: 134 mmol/L — ABNORMAL LOW (ref 135–145)
TOTAL PROTEIN: 7.6 g/dL (ref 6.5–8.1)

## 2016-01-05 LAB — URINE MICROSCOPIC-ADD ON

## 2016-01-05 LAB — CBC
HEMATOCRIT: 35.7 % — AB (ref 36.0–46.0)
HEMOGLOBIN: 11.5 g/dL — AB (ref 12.0–15.0)
MCH: 29.4 pg (ref 26.0–34.0)
MCHC: 32.2 g/dL (ref 30.0–36.0)
MCV: 91.3 fL (ref 78.0–100.0)
Platelets: 336 10*3/uL (ref 150–400)
RBC: 3.91 MIL/uL (ref 3.87–5.11)
RDW: 15.5 % (ref 11.5–15.5)
WBC: 6.4 10*3/uL (ref 4.0–10.5)

## 2016-01-05 LAB — URINALYSIS, ROUTINE W REFLEX MICROSCOPIC
Glucose, UA: NEGATIVE mg/dL
Ketones, ur: NEGATIVE mg/dL
NITRITE: NEGATIVE
PH: 5 (ref 5.0–8.0)
Protein, ur: 30 mg/dL — AB
SPECIFIC GRAVITY, URINE: 1.03 (ref 1.005–1.030)

## 2016-01-05 LAB — CBG MONITORING, ED: GLUCOSE-CAPILLARY: 241 mg/dL — AB (ref 65–99)

## 2016-01-05 LAB — LIPASE, BLOOD: Lipase: 21 U/L (ref 11–51)

## 2016-01-05 MED ORDER — CIPROFLOXACIN HCL 500 MG PO TABS: 500.0000 mg | ORAL_TABLET | Freq: Two times a day (BID) | ORAL | Status: DC

## 2016-01-05 MED ORDER — ONDANSETRON 4 MG PO TBDP: 4.0000 mg | ORAL_TABLET | Freq: Three times a day (TID) | ORAL | Status: DC | PRN

## 2016-01-05 MED ORDER — CEFTRIAXONE SODIUM 1 G IJ SOLR
1.0000 g | Freq: Once | INTRAMUSCULAR | Status: AC
  Administered 2016-01-05: 1 g via INTRAVENOUS
  Filled 2016-01-05: qty 10

## 2016-01-05 MED ORDER — SODIUM CHLORIDE 0.9 % IV BOLUS (SEPSIS)
1000.0000 mL | Freq: Once | INTRAVENOUS | Status: AC
  Administered 2016-01-05: 1000 mL via INTRAVENOUS

## 2016-01-05 NOTE — ED Provider Notes (Addendum)
CSN: [id number]     Arrival date & time 01/05/16  0841 History   First MD Initiated Contact with Patient 01/05/16 [phone]     Chief Complaint  Patient presents with  . Abdominal Pain      HPI  Patient presents evaluation of nausea and vomiting.  She's felt poorly for the last 2 days. Nausea starting this morning. States she had some discomfort in her abdomen. She vomited and felt better. Her nausea resolved en route with EMS with IV Zofran.  No developing currently. No diarrhea. Last vomiting yesterday. Chronic dysuria and states she has multiple urinary tract infections in the last few years. Also concerned because she had a blister on her left middle toe and some redness into her left leg. Although this seems to be improving. Was seen and evaluated at [facility] one month ago for this.    Past Medical History  Diagnosis Date  . Diabetes mellitus without complication (HCC)   . Hypertension   . Hyperlipidemia    Past Surgical History  Procedure Laterality Date  . Pancreas surgery  1985   No family history on file. Social History  Substance Use Topics  . Smoking status: Former Games developer  . Smokeless tobacco: None  . Alcohol Use: No   OB History    No data available     Review of Systems  Constitutional: Negative for fever, chills, diaphoresis, appetite change and fatigue.  HENT: Negative for mouth sores, sore throat and trouble swallowing.   Eyes: Negative for visual disturbance.  Respiratory: Negative for cough, chest tightness, shortness of breath and wheezing.   Cardiovascular: Negative for chest pain.  Gastrointestinal: Positive for nausea and vomiting. Negative for abdominal pain, diarrhea and abdominal distention.  Endocrine: Negative for polydipsia, polyphagia and polyuria.  Genitourinary: Negative for dysuria, frequency and hematuria.  Musculoskeletal: Negative for gait problem.  Skin: Positive for color change. Negative for pallor and rash.       Left toe,  foot, and lower leg  Neurological: Negative for dizziness, syncope, light-headedness and headaches.  Hematological: Does not bruise/bleed easily.  Psychiatric/Behavioral: Negative for behavioral problems and confusion.      Allergies  Review of patient's allergies indicates no known allergies.  Home Medications   Prior to Admission medications   Medication Sig Start Date End Date Taking? Authorizing Provider  acetaminophen (TYLENOL EX ST ARTHRITIS PAIN) 500 MG tablet Take 500 mg by mouth every 6 (six) hours as needed for mild pain or moderate pain.    Yes Historical Provider, MD  amLODipine (NORVASC) 10 MG tablet TAKE 1 TABLET BY MOUTH EVERY DAY 10/21/15  Yes Reather Littler, MD  atorvastatin (LIPITOR) 10 MG tablet TAKE 1 TABLET BY MOUTH DAILY 09/12/15  Yes Reather Littler, MD  CRANBERRY PO Take 1 tablet by mouth 2 (two) times daily.   Yes Historical Provider, MD  fenofibrate 160 MG tablet TAKE 1 TABLET BY MOUTH DAILY 10/21/15  Yes Reather Littler, MD  Fexofenadine HCl (ALLEGRA PO) Take 1 tablet by mouth daily.   Yes Historical Provider, MD  glucose blood (ONE TOUCH ULTRA TEST) test strip TEST EIGHT TIMES DAILY 11/15/15  Yes Reather Littler, MD  hydrochlorothiazide (MICROZIDE) 12.5 MG capsule Take 1 capsule (12.5 mg total) by mouth 2 (two) times daily. 10/29/15  Yes Reather Littler, MD  Insulin Pen Needle (NOVOTWIST) 32G X 5 MM MISC Use to inject victoza daily 02/07/15  Yes Reather Littler, MD  insulin regular human CONCENTRATED (HUMULIN R) 500 UNIT/ML injection INJECT  44 UNITS AS MARKED ON THE INSULIN SYRINGE UNDER THE SKIN THREE TIMES DAILY Patient taking differently: INJECT 20 UNITS AS MARKED ON THE INSULIN SYRINGE UNDER THE SKIN THREE TIMES DAILY 07/02/15  Yes Reather Littler, MD  mirabegron ER (MYRBETRIQ) 50 MG TB24 tablet Take 50 mg by mouth daily.   Yes Historical Provider, MD  montelukast (SINGULAIR) 10 MG tablet Take 10 mg by mouth at bedtime.   Yes Historical Provider, MD  nitrofurantoin (MACRODANTIN) 50 MG capsule  Take 50 mg by mouth 4 (four) times daily.   Yes Historical Provider, MD  Omega-3 Fatty Acids (FISH OIL) 1000 MG CAPS Take 1 capsule by mouth daily.    Yes Historical Provider, MD  omeprazole (PRILOSEC) 20 MG capsule Take 20 mg by mouth daily.   Yes Historical Provider, MD  ONE TOUCH LANCETS MISC Use to check blood sugars 8 times daily 10/16/13  Yes Reather Littler, MD  solifenacin (VESICARE) 10 MG tablet Take 10 mg by mouth daily.   Yes Historical Provider, MD  trimethoprim (TRIMPEX) 100 MG tablet Take 100 mg by mouth daily.    Yes Historical Provider, MD  ciprofloxacin (CIPRO) 500 MG tablet Take 1 tablet (500 mg total) by mouth every 12 (twelve) hours. 01/05/16   Rolland Porter, MD  metFORMIN (GLUCOPHAGE-XR) 500 MG 24 hr tablet Take 3 tablets (1,500 mg total) by mouth daily with supper. Patient not taking: Reported on 01/05/2016 09/20/15   Reather Littler, MD  ondansetron (ZOFRAN ODT) 4 MG disintegrating tablet Take 1 tablet (4 mg total) by mouth every 8 (eight) hours as needed for nausea. 01/05/16   Rolland Porter, MD   BP 148/64 mmHg  Pulse 120  Temp(Src) 98.2 F (36.8 C) (Oral)  Resp 20  SpO2 93% Physical Exam  Constitutional: She is oriented to person, place, and time. She appears well-developed and well-nourished. No distress.  HENT:  Head: Normocephalic.  Eyes: Conjunctivae are normal. Pupils are equal, round, and reactive to light. No scleral icterus.  Neck: Normal range of motion. Neck supple. No thyromegaly present.  Cardiovascular: Normal rate and regular rhythm.  Exam reveals no gallop and no friction rub.   No murmur heard. Pulmonary/Chest: Effort normal and breath sounds normal. No respiratory distress. She has no wheezes. She has no rales.  Abdominal: Soft. Bowel sounds are normal. She exhibits no distension. There is no tenderness. There is no rebound.  Obese abdomen. Soft. Nontender.  Musculoskeletal: Normal range of motion.       Feet:  Neurological: She is alert and oriented to person,  place, and time.  Skin: Skin is warm and dry. No rash noted.     Psychiatric: She has a normal mood and affect. Her behavior is normal.    ED Course  Procedures (including critical care time) Labs Review Labs Reviewed  COMPREHENSIVE METABOLIC PANEL - Abnormal; Notable for the following:    Sodium 134 (*)    Chloride 96 (*)    CO2 21 (*)    Glucose, Bld 297 (*)    BUN 76 (*)    Creatinine, Ser 1.63 (*)    Albumin 3.2 (*)    ALT 13 (*)    GFR calc non Af Amer 31 (*)    GFR calc Af Amer 36 (*)    Anion gap 17 (*)    All other components within normal limits  CBC - Abnormal; Notable for the following:    Hemoglobin 11.5 (*)    HCT 35.7 (*)  All other components within normal limits  URINALYSIS, ROUTINE W REFLEX MICROSCOPIC (NOT [facility]) - Abnormal; Notable for the following:    Color, Urine BROWN (*)    APPearance TURBID (*)    Hgb urine dipstick LARGE (*)    Bilirubin Urine SMALL (*)    Protein, ur 30 (*)    Leukocytes, UA MODERATE (*)    All other components within normal limits  URINE MICROSCOPIC-ADD ON - Abnormal; Notable for the following:    Squamous Epithelial / LPF 0-5 (*)    Bacteria, UA MANY (*)    All other components within normal limits  CBG MONITORING, ED - Abnormal; Notable for the following:    Glucose-Capillary 241 (*)    All other components within normal limits  URINE CULTURE  LIPASE, BLOOD    Imaging Review No results found. I have personally reviewed and evaluated these images and lab results as part of my medical decision-making.   EKG Interpretation None      MDM   Final diagnoses:  UTI (lower urinary tract infection)  Non-intractable vomiting with nausea, vomiting of unspecified type    Patient continues to feel well. Felt much better upon arrival after IV Zofran. Urine will appear infected. She chronically has dysuria and frequent UTIs. Does not appear septic or toxic. Is taking by mouth liquids without difficulty.  His been able  to drink  the emergency room. Has benign abdominal exam.  Creatinine is at its baseline. Given IV fluid. Feels well. Plan is IV Rocephin. Discharged home Cipro and Zofran push fluids primary care follow-up. ER with acute changes.    Rolland PorterMark Sye Schroepfer, MD 01/05/16 [id number]  Rolland PorterMark Maki Sweetser, MD 01/05/16 1239  Rolland PorterMark Corban Kistler, MD 01/05/16 [phone]

## 2016-01-05 NOTE — Discharge Instructions (Signed)
Nausea and Vomiting °Nausea is a sick feeling that often comes before throwing up (vomiting). Vomiting is a reflex where stomach contents come out of your mouth. Vomiting can cause severe loss of body fluids (dehydration). Children and elderly adults can become dehydrated quickly, especially if they also have diarrhea. Nausea and vomiting are symptoms of a condition or disease. It is important to find the cause of your symptoms. °CAUSES  °· Direct irritation of the stomach lining. This irritation can result from increased acid production (gastroesophageal reflux disease), infection, food poisoning, taking certain medicines (such as nonsteroidal anti-inflammatory drugs), alcohol use, or tobacco use. °· Signals from the brain. These signals could be caused by a headache, heat exposure, an inner ear disturbance, increased pressure in the brain from injury, infection, a tumor, or a concussion, pain, emotional stimulus, or metabolic problems. °· An obstruction in the gastrointestinal tract (bowel obstruction). °· Illnesses such as diabetes, hepatitis, gallbladder problems, appendicitis, kidney problems, cancer, sepsis, atypical symptoms of a heart attack, or eating disorders. °· Medical treatments such as chemotherapy and radiation. °· Receiving medicine that makes you sleep (general anesthetic) during surgery. °DIAGNOSIS °Your caregiver may ask for tests to be done if the problems do not improve after a few days. Tests may also be done if symptoms are severe or if the reason for the nausea and vomiting is not clear. Tests may include: °· Urine tests. °· Blood tests. °· Stool tests. °· Cultures (to look for evidence of infection). °· X-rays or other imaging studies. °Test results can help your caregiver make decisions about treatment or the need for additional tests. °TREATMENT °You need to stay well hydrated. Drink frequently but in small amounts. You may wish to drink water, sports drinks, clear broth, or eat frozen  ice pops or gelatin dessert to help stay hydrated. When you eat, eating slowly may help prevent nausea. There are also some antinausea medicines that may help prevent nausea. °HOME CARE INSTRUCTIONS  °· Take all medicine as directed by your caregiver. °· If you do not have an appetite, do not force yourself to eat. However, you must continue to drink fluids. °· If you have an appetite, eat a normal diet unless your caregiver tells you differently. °· Eat a variety of complex carbohydrates (rice, wheat, potatoes, bread), lean meats, yogurt, fruits, and vegetables. °· Avoid high-fat foods because they are more difficult to digest. °· Drink enough water and fluids to keep your urine clear or pale yellow. °· If you are dehydrated, ask your caregiver for specific rehydration instructions. Signs of dehydration may include: °· Severe thirst. °· Dry lips and mouth. °· Dizziness. °· Dark urine. °· Decreasing urine frequency and amount. °· Confusion. °· Rapid breathing or pulse. °SEEK IMMEDIATE MEDICAL CARE IF:  °· You have blood or brown flecks (like coffee grounds) in your vomit. °· You have black or bloody stools. °· You have a severe headache or stiff neck. °· You are confused. °· You have severe abdominal pain. °· You have chest pain or trouble breathing. °· You do not urinate at least once every 8 hours. °· You develop cold or clammy skin. °· You continue to vomit for longer than 24 to 48 hours. °· You have a fever. °MAKE SURE YOU:  °· Understand these instructions. °· Will watch your condition. °· Will get help right away if you are not doing well or get worse. °  °This information is not intended to replace advice given to you by your health care provider. Make sure   you discuss any questions you have with your health care provider. °  °Document Released: 12/14/2005 Document Revised: 03/07/2012 Document Reviewed: 05/13/2011 °Elsevier Interactive Patient Education ©2016 [employer] ° °Urinary Tract Infection °A  urinary tract infection (UTI) can occur any place along the urinary tract. The tract includes the kidneys, ureters, bladder, and urethra. A type of germ called bacteria often causes a UTI. UTIs are often helped with antibiotic medicine.  °HOME CARE  °· If given, take antibiotics as told by your doctor. Finish them even if you start to feel better. °· Drink enough fluids to keep your pee (urine) clear or pale yellow. °· Avoid tea, drinks with caffeine, and bubbly (carbonated) drinks. °· Pee often. Avoid holding your pee in for a long time. °· Pee before and after having sex (intercourse). °· Wipe from front to back after you poop (bowel movement) if you are a woman. Use each tissue only once. °GET HELP RIGHT AWAY IF:  °· You have back pain. °· You have lower belly (abdominal) pain. °· You have chills. °· You feel sick to your stomach (nauseous). °· You throw up (vomit). °· Your burning or discomfort with peeing does not go away. °· You have a fever. °· Your symptoms are not better in 3 days. °MAKE SURE YOU:  °· Understand these instructions. °· Will watch your condition. °· Will get help right away if you are not doing well or get worse. °  °This information is not intended to replace advice given to you by your health care provider. Make sure you discuss any questions you have with your health care provider. °  °Document Released: 06/01/2008 Document Revised: 01/04/2015 Document Reviewed: 07/14/2012 °Elsevier Interactive Patient Education ©2016 [employer] ° °

## 2016-01-05 NOTE — ED Notes (Signed)
Bed: [id number] Expected date:  Expected time:  Means of arrival:  Comments: Flu like symptoms

## 2016-01-05 NOTE — ED Notes (Signed)
Pt aware of need for urine, cannot void at this time 

## 2016-01-05 NOTE — ED Notes (Signed)
She phoned EMS with c/o mild abd. Pain plus nausea (denies vomiting/diarrhea) since Fri.  She arrives in no distress, having received IV Zofran en route to hospital.  EMS cbg 279.

## 2016-01-06 ENCOUNTER — Emergency Department (HOSPITAL_COMMUNITY): Payer: Medicare Other

## 2016-01-06 ENCOUNTER — Inpatient Hospital Stay (HOSPITAL_COMMUNITY)
Admission: EM | Admit: 2016-01-06 | Discharge: 2016-01-13 | DRG: 394 | Disposition: A | Discharge status: Home Health | Payer: Medicare Other | Attending: Internal Medicine | Admitting: Internal Medicine

## 2016-01-06 ENCOUNTER — Encounter (HOSPITAL_COMMUNITY): Payer: Self-pay | Admitting: Emergency Medicine

## 2016-01-06 ENCOUNTER — Inpatient Hospital Stay (HOSPITAL_COMMUNITY): Payer: Medicare Other

## 2016-01-06 DIAGNOSIS — E872 Acidosis: Secondary | ICD-10-CM | POA: Diagnosis present

## 2016-01-06 DIAGNOSIS — Z794 Long term (current) use of insulin: Secondary | ICD-10-CM

## 2016-01-06 DIAGNOSIS — E1165 Type 2 diabetes mellitus with hyperglycemia: Secondary | ICD-10-CM | POA: Diagnosis present

## 2016-01-06 DIAGNOSIS — N2889 Other specified disorders of kidney and ureter: Secondary | ICD-10-CM

## 2016-01-06 DIAGNOSIS — Z6841 Body Mass Index (BMI) 40.0 and over, adult: Secondary | ICD-10-CM | POA: Diagnosis not present

## 2016-01-06 DIAGNOSIS — Z87891 Personal history of nicotine dependence: Secondary | ICD-10-CM | POA: Diagnosis not present

## 2016-01-06 DIAGNOSIS — B370 Candidal stomatitis: Secondary | ICD-10-CM

## 2016-01-06 DIAGNOSIS — N3281 Overactive bladder: Secondary | ICD-10-CM | POA: Diagnosis present

## 2016-01-06 DIAGNOSIS — N179 Acute kidney failure, unspecified: Secondary | ICD-10-CM | POA: Diagnosis present

## 2016-01-06 DIAGNOSIS — E1151 Type 2 diabetes mellitus with diabetic peripheral angiopathy without gangrene: Secondary | ICD-10-CM | POA: Diagnosis present

## 2016-01-06 DIAGNOSIS — R112 Nausea with vomiting, unspecified: Secondary | ICD-10-CM

## 2016-01-06 DIAGNOSIS — Z90411 Acquired partial absence of pancreas: Secondary | ICD-10-CM

## 2016-01-06 DIAGNOSIS — E1122 Type 2 diabetes mellitus with diabetic chronic kidney disease: Secondary | ICD-10-CM | POA: Diagnosis present

## 2016-01-06 DIAGNOSIS — K5669 Other intestinal obstruction: Secondary | ICD-10-CM | POA: Diagnosis not present

## 2016-01-06 DIAGNOSIS — B379 Candidiasis, unspecified: Secondary | ICD-10-CM | POA: Diagnosis present

## 2016-01-06 DIAGNOSIS — K56609 Unspecified intestinal obstruction, unspecified as to partial versus complete obstruction: Secondary | ICD-10-CM

## 2016-01-06 DIAGNOSIS — R1010 Upper abdominal pain, unspecified: Secondary | ICD-10-CM

## 2016-01-06 DIAGNOSIS — N200 Calculus of kidney: Secondary | ICD-10-CM

## 2016-01-06 DIAGNOSIS — E785 Hyperlipidemia, unspecified: Secondary | ICD-10-CM | POA: Diagnosis present

## 2016-01-06 DIAGNOSIS — N390 Urinary tract infection, site not specified: Secondary | ICD-10-CM | POA: Diagnosis present

## 2016-01-06 DIAGNOSIS — K420 Umbilical hernia with obstruction, without gangrene: Principal | ICD-10-CM | POA: Diagnosis present

## 2016-01-06 DIAGNOSIS — N183 Chronic kidney disease, stage 3 (moderate): Secondary | ICD-10-CM | POA: Diagnosis present

## 2016-01-06 DIAGNOSIS — R1084 Generalized abdominal pain: Secondary | ICD-10-CM | POA: Diagnosis not present

## 2016-01-06 DIAGNOSIS — B961 Klebsiella pneumoniae [K. pneumoniae] as the cause of diseases classified elsewhere: Secondary | ICD-10-CM | POA: Diagnosis present

## 2016-01-06 DIAGNOSIS — E1169 Type 2 diabetes mellitus with other specified complication: Secondary | ICD-10-CM | POA: Diagnosis not present

## 2016-01-06 DIAGNOSIS — I10 Essential (primary) hypertension: Secondary | ICD-10-CM | POA: Diagnosis not present

## 2016-01-06 DIAGNOSIS — I129 Hypertensive chronic kidney disease with stage 1 through stage 4 chronic kidney disease, or unspecified chronic kidney disease: Secondary | ICD-10-CM | POA: Diagnosis present

## 2016-01-06 DIAGNOSIS — R1110 Vomiting, unspecified: Secondary | ICD-10-CM | POA: Diagnosis not present

## 2016-01-06 HISTORY — DX: Gastro-esophageal reflux disease without esophagitis: K21.9

## 2016-01-06 LAB — URINALYSIS, ROUTINE W REFLEX MICROSCOPIC
Glucose, UA: 100 mg/dL — AB
Ketones, ur: NEGATIVE mg/dL
NITRITE: NEGATIVE
PH: 5.5 (ref 5.0–8.0)
Protein, ur: 30 mg/dL — AB
SPECIFIC GRAVITY, URINE: 1.026 (ref 1.005–1.030)

## 2016-01-06 LAB — CBC
HEMATOCRIT: 37.2 % (ref 36.0–46.0)
HEMOGLOBIN: 11.7 g/dL — AB (ref 12.0–15.0)
MCH: 28.9 pg (ref 26.0–34.0)
MCHC: 31.5 g/dL (ref 30.0–36.0)
MCV: 91.9 fL (ref 78.0–100.0)
Platelets: 327 10*3/uL (ref 150–400)
RBC: 4.05 MIL/uL (ref 3.87–5.11)
RDW: 15.1 % (ref 11.5–15.5)
WBC: 5.6 10*3/uL (ref 4.0–10.5)

## 2016-01-06 LAB — COMPREHENSIVE METABOLIC PANEL
ALK PHOS: 54 U/L (ref 38–126)
ALT: 13 U/L — AB (ref 14–54)
ANION GAP: 16 — AB (ref 5–15)
AST: 15 U/L (ref 15–41)
Albumin: 3.5 g/dL (ref 3.5–5.0)
BUN: 79 mg/dL — ABNORMAL HIGH (ref 6–20)
CALCIUM: 9.9 mg/dL (ref 8.9–10.3)
CO2: 24 mmol/L (ref 22–32)
Chloride: 95 mmol/L — ABNORMAL LOW (ref 101–111)
Creatinine, Ser: 1.37 mg/dL — ABNORMAL HIGH (ref 0.44–1.00)
GFR calc Af Amer: 44 mL/min — ABNORMAL LOW (ref >60)
GFR calc non Af Amer: 38 mL/min — ABNORMAL LOW (ref >60)
GLUCOSE: 232 mg/dL — AB (ref 65–99)
Potassium: 4.4 mmol/L (ref 3.5–5.1)
Sodium: 135 mmol/L (ref 135–145)
Total Bilirubin: 0.8 mg/dL (ref 0.3–1.2)
Total Protein: 8.1 g/dL (ref 6.5–8.1)

## 2016-01-06 LAB — URINE MICROSCOPIC-ADD ON

## 2016-01-06 LAB — LIPASE, BLOOD: Lipase: 21 U/L (ref 11–51)

## 2016-01-06 LAB — GLUCOSE, CAPILLARY: Glucose-Capillary: 298 mg/dL — ABNORMAL HIGH (ref 65–99)

## 2016-01-06 LAB — TROPONIN I: Troponin I: 0.05 ng/mL — ABNORMAL HIGH (ref <0.031)

## 2016-01-06 LAB — I-STAT CG4 LACTIC ACID, ED: Lactic Acid, Venous: 1.42 mmol/L (ref 0.5–2.0)

## 2016-01-06 LAB — LACTIC ACID, PLASMA: Lactic Acid, Venous: 1.4 mmol/L (ref 0.5–2.0)

## 2016-01-06 MED ORDER — INSULIN ASPART 100 UNIT/ML ~~LOC~~ SOLN
0.0000 [IU] | Freq: Every day | SUBCUTANEOUS | Status: DC
  Administered 2016-01-06: 3 [IU] via SUBCUTANEOUS

## 2016-01-06 MED ORDER — LORATADINE 10 MG PO TABS
10.0000 mg | ORAL_TABLET | Freq: Every day | ORAL | Status: DC
  Filled 2016-01-06: qty 1

## 2016-01-06 MED ORDER — ONDANSETRON HCL 4 MG PO TABS
4.0000 mg | ORAL_TABLET | Freq: Four times a day (QID) | ORAL | Status: DC | PRN
Start: 2016-01-06 — End: 2016-01-13

## 2016-01-06 MED ORDER — DEXTROSE 5 % IV SOLN
500.0000 mg | Freq: Once | INTRAVENOUS | Status: AC
  Administered 2016-01-06: 500 mg via INTRAVENOUS
  Filled 2016-01-06: qty 500

## 2016-01-06 MED ORDER — MIRABEGRON ER 50 MG PO TB24
50.0000 mg | ORAL_TABLET | Freq: Every day | ORAL | Status: DC
  Administered 2016-01-06 – 2016-01-13 (×6): 50 mg via ORAL
  Filled 2016-01-06 (×8): qty 1

## 2016-01-06 MED ORDER — OMEGA-3-ACID ETHYL ESTERS 1 G PO CAPS
1.0000 g | ORAL_CAPSULE | Freq: Every day | ORAL | Status: DC
  Administered 2016-01-09 – 2016-01-13 (×5): 1 g via ORAL
  Filled 2016-01-06 (×7): qty 1

## 2016-01-06 MED ORDER — ONDANSETRON HCL 4 MG/2ML IJ SOLN
4.0000 mg | Freq: Once | INTRAMUSCULAR | Status: AC
  Administered 2016-01-06: 4 mg via INTRAVENOUS
  Filled 2016-01-06: qty 2

## 2016-01-06 MED ORDER — INSULIN GLARGINE 100 UNIT/ML ~~LOC~~ SOLN
20.0000 [IU] | Freq: Every day | SUBCUTANEOUS | Status: DC
  Administered 2016-01-06: 20 [IU] via SUBCUTANEOUS
  Filled 2016-01-06: qty 0.2

## 2016-01-06 MED ORDER — ONDANSETRON HCL 4 MG/2ML IJ SOLN
4.0000 mg | Freq: Four times a day (QID) | INTRAMUSCULAR | Status: DC | PRN
  Administered 2016-01-07: 4 mg via INTRAVENOUS
  Filled 2016-01-06: qty 2

## 2016-01-06 MED ORDER — INSULIN ASPART 100 UNIT/ML ~~LOC~~ SOLN: 0.0000 [IU] | Freq: Three times a day (TID) | SUBCUTANEOUS | Status: DC

## 2016-01-06 MED ORDER — SODIUM CHLORIDE 0.9 % IV BOLUS (SEPSIS)
1000.0000 mL | Freq: Once | INTRAVENOUS | Status: AC
  Administered 2016-01-06: 1000 mL via INTRAVENOUS

## 2016-01-06 MED ORDER — OXYCODONE HCL 5 MG PO TABS
5.0000 mg | ORAL_TABLET | ORAL | Status: DC | PRN
  Administered 2016-01-06 – 2016-01-13 (×13): 5 mg via ORAL
  Filled 2016-01-06 (×14): qty 1

## 2016-01-06 MED ORDER — DEXTROSE 5 % IV SOLN
1.0000 g | INTRAVENOUS | Status: DC
  Administered 2016-01-07 – 2016-01-12 (×6): 1 g via INTRAVENOUS
  Filled 2016-01-06 (×7): qty 10

## 2016-01-06 MED ORDER — SODIUM CHLORIDE 0.9 % IJ SOLN
3.0000 mL | Freq: Two times a day (BID) | INTRAMUSCULAR | Status: DC
  Administered 2016-01-06 – 2016-01-12 (×7): 3 mL via INTRAVENOUS

## 2016-01-06 MED ORDER — DARIFENACIN HYDROBROMIDE ER 15 MG PO TB24
15.0000 mg | ORAL_TABLET | Freq: Every day | ORAL | Status: DC
  Administered 2016-01-09 – 2016-01-13 (×5): 15 mg via ORAL
  Filled 2016-01-06 (×7): qty 1

## 2016-01-06 MED ORDER — ATORVASTATIN CALCIUM 10 MG PO TABS
10.0000 mg | ORAL_TABLET | Freq: Every day | ORAL | Status: DC
  Administered 2016-01-06: 10 mg via ORAL
  Filled 2016-01-06 (×2): qty 1

## 2016-01-06 MED ORDER — DEXTROSE 5 % IV SOLN
1.0000 g | Freq: Once | INTRAVENOUS | Status: AC
  Administered 2016-01-06: 1 g via INTRAVENOUS
  Filled 2016-01-06: qty 10

## 2016-01-06 MED ORDER — ENOXAPARIN SODIUM 40 MG/0.4ML ~~LOC~~ SOLN
40.0000 mg | SUBCUTANEOUS | Status: DC
  Administered 2016-01-07 – 2016-01-13 (×7): 40 mg via SUBCUTANEOUS
  Filled 2016-01-06 (×7): qty 0.4

## 2016-01-06 MED ORDER — ACETAMINOPHEN 325 MG PO TABS: 650.0000 mg | ORAL_TABLET | Freq: Four times a day (QID) | ORAL | Status: DC | PRN

## 2016-01-06 MED ORDER — FENOFIBRATE 160 MG PO TABS
160.0000 mg | ORAL_TABLET | Freq: Every day | ORAL | Status: DC
  Administered 2016-01-06: 160 mg via ORAL
  Filled 2016-01-06 (×2): qty 1

## 2016-01-06 MED ORDER — AMLODIPINE BESYLATE 10 MG PO TABS
10.0000 mg | ORAL_TABLET | Freq: Every day | ORAL | Status: DC
  Administered 2016-01-06: 10 mg via ORAL
  Filled 2016-01-06 (×2): qty 1

## 2016-01-06 MED ORDER — SODIUM CHLORIDE 0.9 % IV SOLN
INTRAVENOUS | Status: DC
  Administered 2016-01-06 – 2016-01-10 (×5): via INTRAVENOUS

## 2016-01-06 MED ORDER — MORPHINE SULFATE (PF) 4 MG/ML IV SOLN
4.0000 mg | Freq: Once | INTRAVENOUS | Status: AC
  Administered 2016-01-06: 4 mg via INTRAVENOUS
  Filled 2016-01-06: qty 1

## 2016-01-06 MED ORDER — PANTOPRAZOLE SODIUM 40 MG PO TBEC
40.0000 mg | DELAYED_RELEASE_TABLET | Freq: Every day | ORAL | Status: DC
  Administered 2016-01-06: 40 mg via ORAL
  Filled 2016-01-06 (×2): qty 1

## 2016-01-06 MED ORDER — ACETAMINOPHEN 650 MG RE SUPP: 650.0000 mg | Freq: Four times a day (QID) | RECTAL | Status: DC | PRN

## 2016-01-06 MED ORDER — INSULIN ASPART 100 UNIT/ML ~~LOC~~ SOLN: 6.0000 [IU] | Freq: Three times a day (TID) | SUBCUTANEOUS | Status: DC

## 2016-01-06 MED ORDER — NYSTATIN 100000 UNIT/ML MT SUSP
5.0000 mL | Freq: Four times a day (QID) | OROMUCOSAL | Status: DC
  Administered 2016-01-06 – 2016-01-13 (×17): 500000 [IU] via ORAL
  Filled 2016-01-06 (×29): qty 5

## 2016-01-06 MED ORDER — FLUTICASONE PROPIONATE 50 MCG/ACT NA SUSP
1.0000 | Freq: Every day | NASAL | Status: DC
  Administered 2016-01-07 – 2016-01-09 (×3): 1 via NASAL
  Filled 2016-01-06: qty 16

## 2016-01-06 MED ORDER — SODIUM CHLORIDE 0.9 % IV SOLN
INTRAVENOUS | Status: AC
  Administered 2016-01-06: 150 mL via INTRAVENOUS

## 2016-01-06 NOTE — Progress Notes (Signed)
Utilization Review completed.  Darlene Bartelt RN CM  

## 2016-01-06 NOTE — H&P (Signed)
History and Physical:    Valerie [name]   ZOX:[id number] DOB: 1946-03-29 DOA: 01/06/2016  Referring MD/provider: Dr. Denton Lank PCP: Garth Schlatter, MD   Chief Complaint: Nausea, vomiting, abdominal discomfort  History of Present Illness:   Valerie [name] is an 70 y.o. female the PMH of overactive bladder with chronic UTIs, necrotizing pancreatitis status post partial pancreatectomy, morbid obesity, and type 2 diabetes who presents with 3 day history of N/V and abdominal pain.  Pain is described as intermittent, crampy in quality, and located in lower abdomen radiating to lower back and right flank.  She vomited several times (up to 6) since her symptoms began.  She has been unable to tolerate oral intake.  No associated diarrhea.  No BMs in several days.  No aggravating or alleviating factors identified. Of note, she presented to the ED yesterday and was felt to have a urinary tract infection.  ROS:   Review of Systems  Constitutional: Positive for malaise/fatigue. Negative for fever and chills.  HENT: Negative for congestion and sore throat.   Respiratory: Positive for shortness of breath. Negative for cough, hemoptysis and sputum production.   Cardiovascular: Positive for chest pain and leg swelling. Negative for palpitations.  Gastrointestinal: Positive for heartburn, nausea, vomiting, abdominal pain and constipation. Negative for diarrhea, blood in stool and melena.  Genitourinary: Positive for flank pain. Negative for dysuria, urgency, frequency and hematuria.  Musculoskeletal: Positive for back pain.  Neurological: Positive for dizziness and weakness. Negative for headaches.  Endo/Heme/Allergies: Negative.   Psychiatric/Behavioral: Positive for depression. The patient is nervous/anxious.      Past Medical History:   Past Medical History  Diagnosis Date  . Diabetes mellitus without complication (HCC)   . Hypertension   . Hyperlipidemia   . Seasonal allergies   . Chronic  UTI   . GERD (gastroesophageal reflux disease)   . Necrotizing pancreatitis     Past Surgical History:   Past Surgical History  Procedure Laterality Date  . Pancreas surgery  1985  . Cataract extraction w/ intraocular lens  implant, bilateral      Social History:   Social History   Social History  . Marital Status: Married    Spouse Name: N/A  . Number of Children: 3  . Years of Education: N/A   Occupational History  . Not on file.   Social History Main Topics  . Smoking status: Former Games developer  . Smokeless tobacco: Not on file  . Alcohol Use: No  . Drug Use: Not on file  . Sexual Activity: Not on file   Other Topics Concern  . Not on file   Social History Narrative   Married. Lives with husband.  Ambulates with a walker.    Family history:   Family History  Problem Relation Age of Onset  . Cancer Father     Prostate cancer  . Cancer Mother     Gynecologic    Allergies   Review of patient's allergies indicates no known allergies.  Current Medications:   Prior to Admission medications   Medication Sig Start Date End Date Taking? Authorizing Provider  acetaminophen (TYLENOL EX ST ARTHRITIS PAIN) 500 MG tablet Take 500 mg by mouth every 6 (six) hours as needed for mild pain or moderate pain.    Yes Historical Provider, MD  amLODipine (NORVASC) 10 MG tablet TAKE 1 TABLET BY MOUTH EVERY DAY 10/21/15  Yes Reather Littler, MD  atorvastatin (LIPITOR) 10 MG tablet TAKE 1 TABLET BY MOUTH DAILY  09/12/15  Yes Reather Littler, MD  ciprofloxacin (CIPRO) 500 MG tablet Take 1 tablet (500 mg total) by mouth every 12 (twelve) hours. 01/05/16  Yes Rolland Porter, MD  CRANBERRY PO Take 1 tablet by mouth 2 (two) times daily.   Yes Historical Provider, MD  fenofibrate 160 MG tablet TAKE 1 TABLET BY MOUTH DAILY 10/21/15  Yes Reather Littler, MD  Fexofenadine HCl (ALLEGRA PO) Take 1 tablet by mouth daily as needed (allergies).    Yes Historical Provider, MD  glucose blood (ONE TOUCH ULTRA TEST) test  strip TEST EIGHT TIMES DAILY 11/15/15  Yes Reather Littler, MD  hydrochlorothiazide (MICROZIDE) 12.5 MG capsule Take 1 capsule (12.5 mg total) by mouth 2 (two) times daily. 10/29/15  Yes Reather Littler, MD  Insulin Pen Needle (NOVOTWIST) 32G X 5 MM MISC Use to inject victoza daily 02/07/15  Yes Reather Littler, MD  insulin regular human CONCENTRATED (HUMULIN R) 500 UNIT/ML injection INJECT 44 UNITS AS MARKED ON THE INSULIN SYRINGE UNDER THE SKIN THREE TIMES DAILY Patient taking differently: Inject 20-40 Units into the skin 3 (three) times daily with meals as needed (for high blood sugar.). 150 or above take 20 units, 200 or above take 30 units, if 300 or above take 40units. 07/02/15  Yes Reather Littler, MD  mirabegron ER (MYRBETRIQ) 50 MG TB24 tablet Take 50 mg by mouth daily.   Yes Historical Provider, MD  mometasone (NASONEX) 50 MCG/ACT nasal spray Place 2 sprays into the nose daily as needed (allergies).   Yes Historical Provider, MD  naproxen sodium (ANAPROX) 220 MG tablet Take 440 mg by mouth 2 (two) times daily as needed (pain).   Yes Historical Provider, MD  nitrofurantoin (MACRODANTIN) 50 MG capsule Take 50 mg by mouth 4 (four) times daily.   Yes Historical Provider, MD  Omega-3 Fatty Acids (FISH OIL) 1000 MG CAPS Take 1 capsule by mouth daily.    Yes Historical Provider, MD  omeprazole (PRILOSEC) 20 MG capsule Take 20 mg by mouth daily.   Yes Historical Provider, MD  ondansetron (ZOFRAN ODT) 4 MG disintegrating tablet Take 1 tablet (4 mg total) by mouth every 8 (eight) hours as needed for nausea. 01/05/16  Yes Rolland Porter, MD  ONE TOUCH LANCETS MISC Use to check blood sugars 8 times daily 10/16/13  Yes Reather Littler, MD  solifenacin (VESICARE) 10 MG tablet Take 10 mg by mouth daily.   Yes Historical Provider, MD  trimethoprim (TRIMPEX) 100 MG tablet Take 100 mg by mouth daily.    Yes Historical Provider, MD  metFORMIN (GLUCOPHAGE-XR) 500 MG 24 hr tablet Take 3 tablets (1,500 mg total) by mouth daily with  supper. Patient not taking: Reported on 01/05/2016 09/20/15   Reather Littler, MD    Physical Exam:   Filed Vitals:   01/06/16 1247 01/06/16 1542  BP: 155/73 147/66  Pulse: 108 104  Temp: 98.1 F (36.7 C)   TempSrc: Oral   Resp: 18 18  SpO2: 97% 97%     Physical Exam: Blood pressure 147/66, pulse 104, temperature 98.1 F (36.7 C), temperature source Oral, resp. rate 18, SpO2 97 %. Gen: No acute distress. Head: Normocephalic, atraumatic. Eyes: PERRL, EOMI, sclerae nonicteric. Mouth: Oropharynx clear, white coating on tongue Neck: Supple, no thyromegaly, no lymphadenopathy, no jugular venous distention. Chest: Lungs diminished at the bases. CV: Heart sounds tachycardic. No murmurs, rubs, or gallops. Abdomen: Soft, nontender, significant pannus with purple discoloration/firmness, normal active bowel sounds. Extremities: Extremities with venous stasis dermatitis and 2+ edema.  Skin: Warm and dry. Venous stasis dermatitis. Neuro: Alert and oriented times 3, nonfocal. Psych: Mood and affect normal.   Data Review:    Labs: Basic Metabolic Panel:  Recent Labs Lab 01/05/16 0855 01/06/16 1448  NA 134* 135  K 4.6 4.4  CL 96* 95*  CO2 21* 24  GLUCOSE 297* 232*  BUN 76* 79*  CREATININE 1.63* 1.37*  CALCIUM 9.6 9.9   Liver Function Tests:  Recent Labs Lab 01/05/16 0855 01/06/16 1448  AST 16 15  ALT 13* 13*  ALKPHOS 52 54  BILITOT 0.9 0.8  PROT 7.6 8.1  ALBUMIN 3.2* 3.5    Recent Labs Lab 01/05/16 0855 01/06/16 1448  LIPASE 21 21   No results for input(s): AMMONIA in the last 168 hours. CBC:  Recent Labs Lab 01/05/16 0855 01/06/16 1448  WBC 6.4 5.6  HGB 11.5* 11.7*  HCT 35.7* 37.2  MCV 91.3 91.9  PLT 336 327   Cardiac Enzymes:  Recent Labs Lab 01/06/16 1448  TROPONINI 0.05*    BNP (last 3 results) No results for input(s): PROBNP in the last 8760 hours. CBG:  Recent Labs Lab 01/05/16 0846  GLUCAP 241*    Radiographic Studies: US Abdomen  Complete  01/06/2016  CLINICAL DATA:  Five day history of right-sided pain EXAM: ABDOMEN ULTRASOUND COMPLETE COMPARISON:  None. FINDINGS: Gallbladder: Within the gallbladder, there are multiple small echogenic foci which move and shadow consistent with cholelithiasis. Largest individual gallstone measures 5 mm in length. There is no gallbladder wall thickening or pericholecystic fluid. No sonographic Murphy sign noted by sonographer. Common bile duct: Diameter: 6 mm. No intrahepatic, common hepatic, or common bile duct dilatation. Liver: No focal lesion identified. Within normal limits in parenchymal echogenicity. IVC: No abnormality visualized. Pancreas: Visualized portion unremarkable. Portions of pancreas obscured by gas. Spleen: Size and appearance within normal limits. Right Kidney: Length: 11.7 cm. Echogenicity within normal limits. No mass or visualized. There is mild fullness of the right renal pelvis and upper pole collecting system. Obstructing focus not seen. Left Kidney: Length: 13.3 cm. Echogenicity within normal limits. No mass or hydronephrosis visualized. Abdominal aorta: No aneurysm visualized. Other findings: No demonstrable ascites. There is a hypoechoic lesion just superior to the right kidney, likely an adrenal lesion on the right. This lesion measures 2.5 x 1.3 x 2.5 cm. IMPRESSION: Cholelithiasis. No gallbladder wall thickening or pericholecystic fluid. Fullness of the right renal pelvis and upper pole collecting system. Obstructing lesion not appreciable by ultrasound. This finding may warrant noncontrast enhanced CT to assess for potential ureteral calculus. Probable right renal mass measuring 2.5 x 1.3 x 2.5 cm. CT could also be helpful to further assess this area. Study otherwise unremarkable. Note that portions of pancreas not seen due to overlying gas. Electronically Signed   By: Bretta Bang III M.D.   On: 01/06/2016 18:02   Dg Abd Acute W/chest  01/06/2016  CLINICAL DATA:  Right  upper quadrant abdominal pain with vomiting. History of hypertension and diabetes. EXAM: DG ABDOMEN ACUTE W/ 1V CHEST COMPARISON:  None. FINDINGS: Examination is limited by body habitus. The heart size is at the upper limits of normal. There is patchy airspace disease in the left lower lobe. The right lung is clear. No pneumothorax or significant pleural effusion identified. The bowel gas pattern is normal. There is no free intraperitoneal air. 12 mm calcification overlies the lower pole of the right kidney. Left pelvic calcifications likely vascular. Mild degenerative changes are present within the lumbar  spine and at the right hip. IMPRESSION: 1. Patchy left lower lobe atelectasis or infiltrate. 2. No acute abdominal findings demonstrated. 3. Right mid and pelvic calcifications as described. The former could reflect a renal calculus or gallstone. Electronically Signed   By: Carey BullocksWilliam  Veazey M.D.   On: 01/06/2016 14:56   *I have personally reviewed the images above*  EKG: None done.   Assessment/Plan:   Principal Problem:   Nausea and vomiting, abdominal pain with findings of right renal mass and possible right nephrolithiasis/urinary tract infection - Renal ultrasound with findings as noted above. Right renal mass with possible stone noted. - We'll obtain noncontrast CT to further evaluate. - Empiric Rocephin to treat any associated urinary tract disease. - Follow-up urine cultures done 01/05/16.  Active Problems:   Hyperlipidemia - Continue fenofibrate and statin.    Essential hypertension, benign - Continue Norvasc.    Persistent vomiting - Likely from renal colic. Will treat with Zofran.    Uncontrolled type 2 diabetes mellitus (HCC) - We'll treat with Lantus 20 units daily and insulin resistant SSI with meal coverage. - Check hemoglobin A1c.    Acute kidney injury (HCC)/stage III chronic kidney disease - Baseline creatinine is 1.34. Creatinine yesterday was 1.63, but has improved  to her usual baseline values. - Provide gentle hydration. - Avoid nephrotoxins.    Morbid obesity [facility](HCC) - Dietitian consultation for diet counseling.    Thrush - Nystatin ordered.  DVT prophylaxis - Lovenox ordered.  Code Status / Family Communication / Disposition Plan:   Code Status: Full. Family Communication: Husband at the bedside. Disposition Plan: Home when stable.  Attestation regarding necessity of inpatient status:   The appropriate admission status for this patient is INPATIENT. Inpatient status is judged to be reasonable and necessary in order to provide the required intensity of service to ensure the patient's safety. The patient's presenting symptoms, physical exam findings, and initial radiographic and laboratory data in the context of their chronic comorbidities is felt to place them at high risk for further clinical deterioration. Furthermore, it is not anticipated that the patient will be medically stable for discharge from the hospital within 2 midnights of admission. The following factors support the admission status of inpatient.   -The patient's presenting symptoms include colicky abdominal pain. - The worrisome physical exam findings include tachycardia. - The initial radiographic and laboratory data are worrisome because of pelvic calcifications, right renal mass. - The chronic co-morbidities include morbid obesity and type 2 diabetes. - Patient requires inpatient status due to high intensity of service, high risk for further deterioration and high frequency of surveillance required. - I certify that at the point of admission it is my clinical judgment that the patient will require inpatient hospital care spanning beyond 2 midnights from the point of admission.   Time spent: 75 minutes.  Lorali Khamis [facility] Pager [phone] Cell: [phone]   If 7PM-7AM, please contact night-coverage [email or web address] Password [email or web address] 01/06/2016, 6:09 PM

## 2016-01-06 NOTE — ED Notes (Signed)
Hospitalist at bedside 

## 2016-01-06 NOTE — ED Notes (Signed)
Patient presents with RUQ abdominal pain, emesis x1. Seen yesterday for kidney infection, given zofran and cipro prescriptions. Patient states "They told me I would start to feel better today and I don't feel any better". Rates pain 5/10.

## 2016-01-06 NOTE — ED Notes (Signed)
Pt in CT.

## 2016-01-06 NOTE — Progress Notes (Signed)
[facility]. Admissions in [facility] was notified that the patient was on the floor.

## 2016-01-06 NOTE — ED Provider Notes (Addendum)
CSN: [id number]     Arrival date & time 01/06/16  1228 History   First MD Initiated Contact with Patient 01/06/16 1341     Chief Complaint  Patient presents with  . Abdominal Pain  . Emesis     (Consider location/radiation/quality/duration/timing/severity/associated sxs/prior Treatment) Patient is a 70 y.o. female presenting with abdominal pain and vomiting. The history is provided by the patient and the spouse.  Abdominal Pain Associated symptoms: vomiting   Associated symptoms: no chest pain, no chills, no cough, no dysuria, no fever, no shortness of breath and no sore throat   Emesis Associated symptoms: abdominal pain   Associated symptoms: no chills, no headaches and no sore throat   Patient w hx iddm, utis, presents w nv in the past few days.  Was seen in ED yesterday, and was dx w uti.  Since then, no improvement. Persistent nausea, generally weak. Vomited this AM, darkish brown. No bm in the past few days.  Feels bloated and pain across upper abd, moderate, cramping, comes and goes. +heartburn at times.  No fevers.  Pt has had two doses abx given yesterday.      Past Medical History  Diagnosis Date  . Diabetes mellitus without complication (HCC)   . Hypertension   . Hyperlipidemia    Past Surgical History  Procedure Laterality Date  . Pancreas surgery  1985   No family history on file. Social History  Substance Use Topics  . Smoking status: Former Games developer  . Smokeless tobacco: None  . Alcohol Use: No   OB History    No data available     Review of Systems  Constitutional: Negative for fever and chills.  HENT: Negative for sore throat.   Eyes: Negative for redness.  Respiratory: Negative for cough and shortness of breath.   Cardiovascular: Negative for chest pain.  Gastrointestinal: Positive for vomiting and abdominal pain.  Genitourinary: Negative for dysuria and flank pain.  Musculoskeletal: Negative for back pain and neck pain.  Skin: Negative for rash.   Neurological: Negative for headaches.  Hematological: Does not bruise/bleed easily.  Psychiatric/Behavioral: Negative for confusion.      Allergies  Review of patient's allergies indicates no known allergies.  Home Medications   Prior to Admission medications   Medication Sig Start Date End Date Taking? Authorizing Provider  acetaminophen (TYLENOL EX ST ARTHRITIS PAIN) 500 MG tablet Take 500 mg by mouth every 6 (six) hours as needed for mild pain or moderate pain.     Historical Provider, MD  amLODipine (NORVASC) 10 MG tablet TAKE 1 TABLET BY MOUTH EVERY DAY 10/21/15   Reather Littler, MD  atorvastatin (LIPITOR) 10 MG tablet TAKE 1 TABLET BY MOUTH DAILY 09/12/15   Reather Littler, MD  ciprofloxacin (CIPRO) 500 MG tablet Take 1 tablet (500 mg total) by mouth every 12 (twelve) hours. 01/05/16   Rolland Porter, MD  CRANBERRY PO Take 1 tablet by mouth 2 (two) times daily.    Historical Provider, MD  fenofibrate 160 MG tablet TAKE 1 TABLET BY MOUTH DAILY 10/21/15   Reather Littler, MD  Fexofenadine HCl (ALLEGRA PO) Take 1 tablet by mouth daily.    Historical Provider, MD  glucose blood (ONE TOUCH ULTRA TEST) test strip TEST EIGHT TIMES DAILY 11/15/15   Reather Littler, MD  hydrochlorothiazide (MICROZIDE) 12.5 MG capsule Take 1 capsule (12.5 mg total) by mouth 2 (two) times daily. 10/29/15   Reather Littler, MD  Insulin Pen Needle (NOVOTWIST) 32G X 5 MM MISC Use  to inject victoza daily 02/07/15   Reather Littler, MD  insulin regular human CONCENTRATED (HUMULIN R) 500 UNIT/ML injection INJECT 44 UNITS AS MARKED ON THE INSULIN SYRINGE UNDER THE SKIN THREE TIMES DAILY Patient taking differently: INJECT 20 UNITS AS MARKED ON THE INSULIN SYRINGE UNDER THE SKIN THREE TIMES DAILY 07/02/15   Reather Littler, MD  metFORMIN (GLUCOPHAGE-XR) 500 MG 24 hr tablet Take 3 tablets (1,500 mg total) by mouth daily with supper. Patient not taking: Reported on 01/05/2016 09/20/15   Reather Littler, MD  mirabegron ER (MYRBETRIQ) 50 MG TB24 tablet Take 50 mg by mouth  daily.    Historical Provider, MD  montelukast (SINGULAIR) 10 MG tablet Take 10 mg by mouth at bedtime.    Historical Provider, MD  nitrofurantoin (MACRODANTIN) 50 MG capsule Take 50 mg by mouth 4 (four) times daily.    Historical Provider, MD  Omega-3 Fatty Acids (FISH OIL) 1000 MG CAPS Take 1 capsule by mouth daily.     Historical Provider, MD  omeprazole (PRILOSEC) 20 MG capsule Take 20 mg by mouth daily.    Historical Provider, MD  ondansetron (ZOFRAN ODT) 4 MG disintegrating tablet Take 1 tablet (4 mg total) by mouth every 8 (eight) hours as needed for nausea. 01/05/16   Rolland Porter, MD  ONE TOUCH LANCETS MISC Use to check blood sugars 8 times daily 10/16/13   Reather Littler, MD  solifenacin (VESICARE) 10 MG tablet Take 10 mg by mouth daily.    Historical Provider, MD  trimethoprim (TRIMPEX) 100 MG tablet Take 100 mg by mouth daily.     Historical Provider, MD   BP 155/73 mmHg  Pulse 108  Temp(Src) 98.1 F (36.7 C) (Oral)  Resp 18  SpO2 97% Physical Exam  Constitutional: She appears well-developed and well-nourished. No distress.  HENT:  Mouth/Throat: Oropharynx is clear and moist.  Eyes: Conjunctivae are normal. No scleral icterus.  Neck: Neck supple. No tracheal deviation present.  Cardiovascular: Regular rhythm, normal heart sounds and intact distal pulses.   Pulmonary/Chest: Effort normal and breath sounds normal. No respiratory distress.  Abdominal: Soft. Normal appearance and bowel sounds are normal. She exhibits distension. She exhibits no mass. There is no tenderness. There is no rebound and no guarding.  Mild distension, upper abd tenderness. No peritoneal findings.   Genitourinary:  No cva tenderness.   Musculoskeletal: She exhibits no edema.  Neurological: She is alert.  Skin: Skin is warm and dry. No rash noted. She is not diaphoretic.  Psychiatric: She has a normal mood and affect.  Nursing note and vitals reviewed.   ED Course  Procedures (including critical care  time) Labs Review  Results for orders placed or performed during the hospital encounter of 01/06/16  CBC  Result Value Ref Range   WBC 5.6 4.0 - 10.5 K/uL   RBC 4.05 3.87 - 5.11 MIL/uL   Hemoglobin 11.7 (L) 12.0 - 15.0 g/dL   HCT 16.1 09.6 - 04.5 %   MCV 91.9 78.0 - 100.0 fL   MCH 28.9 26.0 - 34.0 pg   MCHC 31.5 30.0 - 36.0 g/dL   RDW 40.9 81.1 - 91.4 %   Platelets 327 150 - 400 K/uL  Comprehensive metabolic panel  Result Value Ref Range   Sodium 135 135 - 145 mmol/L   Potassium 4.4 3.5 - 5.1 mmol/L   Chloride 95 (L) 101 - 111 mmol/L   CO2 24 22 - 32 mmol/L   Glucose, Bld 232 (H) 65 - 99 mg/dL  BUN 79 (H) 6 - 20 mg/dL   Creatinine, Ser 1.61 (H) 0.44 - 1.00 mg/dL   Calcium 9.9 8.9 - 09.6 mg/dL   Total Protein 8.1 6.5 - 8.1 g/dL   Albumin 3.5 3.5 - 5.0 g/dL   AST 15 15 - 41 U/L   ALT 13 (L) 14 - 54 U/L   Alkaline Phosphatase 54 38 - 126 U/L   Total Bilirubin 0.8 0.3 - 1.2 mg/dL   GFR calc non Af Amer 38 (L) >60 mL/min   GFR calc Af Amer 44 (L) >60 mL/min   Anion gap 16 (H) 5 - 15  Lipase, blood  Result Value Ref Range   Lipase 21 11 - 51 U/L  Urinalysis, Routine w reflex microscopic (not at [facility])  Result Value Ref Range   Color, Urine YELLOW YELLOW   APPearance CLOUDY (A) CLEAR   Specific Gravity, Urine 1.026 1.005 - 1.030   pH 5.5 5.0 - 8.0   Glucose, UA 100 (A) NEGATIVE mg/dL   Hgb urine dipstick LARGE (A) NEGATIVE   Bilirubin Urine SMALL (A) NEGATIVE   Ketones, ur NEGATIVE NEGATIVE mg/dL   Protein, ur 30 (A) NEGATIVE mg/dL   Nitrite NEGATIVE NEGATIVE   Leukocytes, UA LARGE (A) NEGATIVE  Troponin I  Result Value Ref Range   Troponin I 0.05 (H) <0.031 ng/mL  Urine microscopic-add on  Result Value Ref Range   Squamous Epithelial / LPF 6-30 (A) NONE SEEN   WBC, UA TOO NUMEROUS TO COUNT 0 - 5 WBC/hpf   RBC / HPF TOO NUMEROUS TO COUNT 0 - 5 RBC/hpf   Bacteria, UA FEW (A) NONE SEEN  I-Stat CG4 Lactic Acid, ED  Result Value Ref Range   Lactic Acid, Venous 1.42  0.5 - 2.0 mmol/L   Dg Abd Acute W/chest  01/06/2016  CLINICAL DATA:  Right upper quadrant abdominal pain with vomiting. History of hypertension and diabetes. EXAM: DG ABDOMEN ACUTE W/ 1V CHEST COMPARISON:  None. FINDINGS: Examination is limited by body habitus. The heart size is at the upper limits of normal. There is patchy airspace disease in the left lower lobe. The right lung is clear. No pneumothorax or significant pleural effusion identified. The bowel gas pattern is normal. There is no free intraperitoneal air. 12 mm calcification overlies the lower pole of the right kidney. Left pelvic calcifications likely vascular. Mild degenerative changes are present within the lumbar spine and at the right hip. IMPRESSION: 1. Patchy left lower lobe atelectasis or infiltrate. 2. No acute abdominal findings demonstrated. 3. Right mid and pelvic calcifications as described. The former could reflect a renal calculus or gallstone. Electronically Signed   By: Carey Bullocks M.D.   On: 01/06/2016 14:56       I have personally reviewed and evaluated these images and lab results as part of my medical decision-making.    MDM   Iv ns. Labs.   Reviewed nursing notes and prior charts for additional history.   Pt w culture yesterday, pos gnr.   Iv abx.  cxr result noted, pt denies cough or uri c/o.    ua positive. Iv abx.  Calcifications on plain films, and ruq pain, mild tenderness - will get u/s.  Will call hospitalists for admit.   1655 u/s pending.   Note that if no gallstones, potentially an infected kidney stone could also result in persistent uti/symptoms.  If abd u/s neg, may need ct to rule out kidney stones.   Discussed with Dr Darnelle Catalan  that imaging studies remain pending.  She will admit.  Recheck abd soft nt.       Cathren LaineKevin Ellieana Dolecki, MD 01/06/16 [phone]

## 2016-01-06 NOTE — ED Notes (Signed)
US at Bedside

## 2016-01-06 NOTE — Progress Notes (Signed)
RN notified floor coverage that the patient is in room 1406, from [facility].

## 2016-01-07 ENCOUNTER — Inpatient Hospital Stay (HOSPITAL_COMMUNITY): Payer: Medicare Other

## 2016-01-07 DIAGNOSIS — R1110 Vomiting, unspecified: Secondary | ICD-10-CM

## 2016-01-07 DIAGNOSIS — R1084 Generalized abdominal pain: Secondary | ICD-10-CM

## 2016-01-07 LAB — BASIC METABOLIC PANEL
ANION GAP: 12 (ref 5–15)
BUN: 58 mg/dL — ABNORMAL HIGH (ref 6–20)
CALCIUM: 9.2 mg/dL (ref 8.9–10.3)
CO2: 23 mmol/L (ref 22–32)
Chloride: 101 mmol/L (ref 101–111)
Creatinine, Ser: 1.24 mg/dL — ABNORMAL HIGH (ref 0.44–1.00)
GFR, EST AFRICAN AMERICAN: 49 mL/min — AB (ref >60)
GFR, EST NON AFRICAN AMERICAN: 43 mL/min — AB (ref >60)
GLUCOSE: 294 mg/dL — AB (ref 65–99)
Potassium: 4.3 mmol/L (ref 3.5–5.1)
SODIUM: 136 mmol/L (ref 135–145)

## 2016-01-07 LAB — GLUCOSE, CAPILLARY
GLUCOSE-CAPILLARY: 179 mg/dL — AB (ref 65–99)
GLUCOSE-CAPILLARY: 152 mg/dL — AB (ref 65–99)
Glucose-Capillary: 248 mg/dL — ABNORMAL HIGH (ref 65–99)
Glucose-Capillary: 237 mg/dL — ABNORMAL HIGH (ref 65–99)
Glucose-Capillary: 199 mg/dL — ABNORMAL HIGH (ref 65–99)
Glucose-Capillary: 203 mg/dL — ABNORMAL HIGH (ref 65–99)

## 2016-01-07 LAB — URINE CULTURE
Culture: >=100000
Special Requests: NORMAL

## 2016-01-07 MED ORDER — HYDRALAZINE HCL 20 MG/ML IJ SOLN: 10.0000 mg | Freq: Three times a day (TID) | INTRAMUSCULAR | Status: DC | PRN

## 2016-01-07 MED ORDER — PANTOPRAZOLE SODIUM 40 MG IV SOLR
40.0000 mg | INTRAVENOUS | Status: DC
  Administered 2016-01-07 – 2016-01-12 (×6): 40 mg via INTRAVENOUS
  Filled 2016-01-07 (×7): qty 40

## 2016-01-07 MED ORDER — INSULIN ASPART 100 UNIT/ML ~~LOC~~ SOLN
0.0000 [IU] | SUBCUTANEOUS | Status: DC
  Administered 2016-01-07 (×2): 7 [IU] via SUBCUTANEOUS
  Administered 2016-01-07 – 2016-01-08 (×5): 4 [IU] via SUBCUTANEOUS
  Administered 2016-01-08 (×2): 11 [IU] via SUBCUTANEOUS
  Administered 2016-01-09: 4 [IU] via SUBCUTANEOUS
  Administered 2016-01-09: 7 [IU] via SUBCUTANEOUS
  Administered 2016-01-09: 3 [IU] via SUBCUTANEOUS
  Administered 2016-01-09: 4 [IU] via SUBCUTANEOUS
  Administered 2016-01-09: 15 [IU] via SUBCUTANEOUS
  Administered 2016-01-09: 7 [IU] via SUBCUTANEOUS
  Administered 2016-01-10: 4 [IU] via SUBCUTANEOUS
  Administered 2016-01-10: 11 [IU] via SUBCUTANEOUS
  Administered 2016-01-10: 3 [IU] via SUBCUTANEOUS
  Administered 2016-01-10: 7 [IU] via SUBCUTANEOUS
  Administered 2016-01-10: 4 [IU] via SUBCUTANEOUS
  Administered 2016-01-10 (×2): 7 [IU] via SUBCUTANEOUS
  Administered 2016-01-11 (×2): 4 [IU] via SUBCUTANEOUS
  Administered 2016-01-11: 7 [IU] via SUBCUTANEOUS
  Administered 2016-01-11: 11 [IU] via SUBCUTANEOUS

## 2016-01-07 MED ORDER — MORPHINE SULFATE (PF) 2 MG/ML IV SOLN
1.0000 mg | INTRAVENOUS | Status: DC | PRN
  Administered 2016-01-07 – 2016-01-09 (×4): 1 mg via INTRAVENOUS
  Filled 2016-01-07 (×4): qty 1

## 2016-01-07 MED ORDER — MORPHINE SULFATE (PF) 4 MG/ML IV SOLN
4.0000 mg | Freq: Once | INTRAVENOUS | Status: AC
  Administered 2016-01-07: 4 mg via INTRAVENOUS
  Filled 2016-01-07: qty 1

## 2016-01-07 NOTE — Progress Notes (Addendum)
[facility] PROGRESS NOTE  Valerie [name] WUJ:[id number] DOB: 1946/01/27 DOA: 01/06/2016 PCP: Garth Schlatter, MD  Assessment/Plan: [name] is an 70 y.o. female the PMH of overactive bladder with chronic UTIs, necrotizing pancreatitis status post partial pancreatectomy, morbid obesity, and type 2 diabetes who presents with 3 day history of N/V and abdominal pain. Pain is described as intermittent, crampy in quality, and located in lower abdomen radiating to lower back and right flank. She vomited several times (up to 6) since her symptoms began. She has been unable to tolerate oral intake. No associated diarrhea. No BMs in several days. No aggravating or alleviating factors identified. Of note, she presented to the ED yesterday and was felt to have a urinary tract infection.  Nausea and vomiting, abdominal pain ;SBO.  - CT abdomen: with finding consistent with SBO.  -continue with ceftriaxone   Acute kidney injury (HCC)/stage III chronic kidney disease - Baseline creatinine is 1.34. Creatinine yesterday was 1.63, but has improved to her usual baseline values. -improving with IV fluids.  - Renal ultrasound with findings as noted above. 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  unchanged for last 2 years.. 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 Fu PCP.  -no renal mass on CT scan, enlargement of right collecting system and right ureter. Renal function improving. Might need follow up with urology.   UTI; Klebsiella on urine culture  Continue with ceftriaxone.   Elevated anion gap metabolic acidosis;  Improving with fluids. Insulin, lantus SSI.  Treating for infection.   Hyperlipidemia - Continue fenofibrate and statin.   Essential hypertension, benign - Hold Norvasc. PRN hydralazine   Uncontrolled type 2 diabetes mellitus (HCC) -hold lantus due to NPO, SSI.  - Check hemoglobin A1c.    Code Status: Full Code.  Family Communication: patient  Disposition Plan: remain inpatient  for treatment of SBO   Consultants:  surgery  Procedures:  [place];   Antibiotics:  Ceftriaxone.   HPI/Subjective: Patient complaining of chronic back pain.  Also complaining of abdominal pain   Objective: Filed Vitals:   01/07/16 0446 01/07/16 1433  BP: 128/48 141/66  Pulse: 103 92  Temp: 98 F (36.7 C) 97.7 F (36.5 C)  Resp: 18 20    Intake/Output Summary (Last 24 hours) at 01/07/16 1526 Last data filed at 01/07/16 0452  Gross per 24 hour  Intake      0 ml  Output   1000 ml  Net  -1000 ml   Filed Weights   01/06/16 2123  Weight: 126.4 kg (278 lb 10.6 oz)    Exam:   General:  NAD  Cardiovascular: S 1, S 2 RRR  Respiratory: CTA  Abdomen: BS, present, abdominal ventral hernia, tenderness,multiple [lescar.   Musculoskeletal: no edema  Data Reviewed: Basic Metabolic Panel:  Recent Labs Lab 01/05/16 0855 01/06/16 1448 01/07/16 0355  NA 134* 135 136  K 4.6 4.4 4.3  CL 96* 95* 101  CO2 21* 24 23  GLUCOSE 297* 232* 294*  BUN 76* 79* 58*  CREATININE 1.63* 1.37* 1.24*  CALCIUM 9.6 9.9 9.2   Liver Function Tests:  Recent Labs Lab 01/05/16 0855 01/06/16 1448  AST 16 15  ALT 13* 13*  ALKPHOS 52 54  BILITOT 0.9 0.8  PROT 7.6 8.1  ALBUMIN 3.2* 3.5    Recent Labs Lab 01/05/16 0855 01/06/16 1448  LIPASE 21 21   No results for input(s): AMMONIA in the last 168 hours. CBC:  Recent Labs Lab 01/05/16 0855 01/06/16 1448  WBC 6.4 5.6  HGB 11.5* 11.7*  HCT 35.7* 37.2  MCV 91.3 91.9  PLT 336 327   Cardiac Enzymes:  Recent Labs Lab 01/06/16 1448  TROPONINI 0.05*   BNP (last 3 results) No results for input(s): BNP in the last 8760 hours.  ProBNP (last 3 results) No results for input(s): PROBNP in the last 8760 hours.  CBG:  Recent Labs Lab 01/05/16 0846 01/06/16 2156 01/07/16 0451 01/07/16 0732 01/07/16 1151  GLUCAP 241* 298* 248* 237* 179*    Recent Results (from the past 240 hour(s))  Urine culture     Status: None    Collection Time: 01/05/16  1:30 PM  Result Value Ref Range Status   Specimen Description URINE, CLEAN CATCH  Final   Special Requests none Normal  Final   Culture   Final    >=100,000 COLONIES/mL KLEBSIELLA PNEUMONIAE Performed at [facility]    Report Status 01/07/2016 FINAL  Final   Organism ID, Bacteria KLEBSIELLA PNEUMONIAE  Final      Susceptibility   Klebsiella pneumoniae - MIC*    AMPICILLIN >=32 RESISTANT Resistant     CEFAZOLIN <=4 SENSITIVE Sensitive     CEFTRIAXONE <=1 SENSITIVE Sensitive     CIPROFLOXACIN <=0.25 SENSITIVE Sensitive     GENTAMICIN <=1 SENSITIVE Sensitive     IMIPENEM <=0.25 SENSITIVE Sensitive     NITROFURANTOIN 64 INTERMEDIATE Intermediate     TRIMETH/SULFA <=20 SENSITIVE Sensitive     AMPICILLIN/SULBACTAM 8 SENSITIVE Sensitive     PIP/TAZO <=4 SENSITIVE Sensitive     * >=100,000 COLONIES/mL KLEBSIELLA PNEUMONIAE  Blood culture (routine x 2)     Status: None (Preliminary result)   Collection Time: 01/06/16  3:13 PM  Result Value Ref Range Status   Specimen Description BLOOD LEFT ANTECUBITAL  Final   Special Requests BOTTLES DRAWN AEROBIC AND ANAEROBIC  Final   Culture   Final    NO GROWTH < 24 HOURS Performed at [facility]    Report Status PENDING  Incomplete  Blood culture (routine x 2)     Status: None (Preliminary result)   Collection Time: 01/06/16  3:14 PM  Result Value Ref Range Status   Specimen Description BLOOD RIGHT FOREARM  Final   Special Requests BOTTLES DRAWN AEROBIC ONLY  Final   Culture   Final    NO GROWTH < 24 HOURS Performed at [facility]    Report Status PENDING  Incomplete     Studies: Ct Abdomen Pelvis Wo Contrast  01/06/2016  CLINICAL DATA:  Right-sided abdominal pain. Possible calculi on conventional radiographs. History of necrotizing pancreatitis and diabetes. History pancreatic surgery. EXAM: CT ABDOMEN AND PELVIS WITHOUT CONTRAST TECHNIQUE: Multidetector CT imaging of the  abdomen and pelvis was performed following the standard protocol without IV contrast. COMPARISON:  01/06/2016 ultrasounds and conventional radiographs FINDINGS: Body habitus reduces diagnostic sensitivity and specificity. Lower chest: Very dense mitral valve calcification observed. Mosaic attenuation at the left lung base with associated atelectasis. Mild cardiomegaly. Hepatobiliary: Clustered small gallstones measuring up to 6 mm in diameter are settled dependently in the gallbladder. None of these are directly observed to be in the common bile duct or common hepatic duct. Punctate calcification along the capsule of the inferior tip of the right hepatic lobe, likely due to remote inflammation. Pancreas: Atrophic pancreatic body and pancreatic head. Poor definition of fat planes around the pancreatic tail. Aortoiliac atherosclerotic vascular disease. In the vicinity of the spleen, possibly from acute inflammation or chronic scarring.  Spleen: Unremarkable Adrenals/Urinary Tract: Chronic appearing calcification in the upper portion of the left adrenal gland. Right adrenal mass measuring 3.1 by 2.1 cm has internal density of 14 Hounsfield units, technically nonspecific. Mild thinning of the renal cortex bilaterally with prominence of renal sinus adipose tissue. Prominent right collecting system and proximal right hydroureter. Suspected wall thickening in the right collecting system. Normal left ureter. No urinary tract calculi are identified. Tiny amount of gas in the urinary bladder. Query recent catheterization. Stomach/Bowel: Right periumbilical hernia containing a loop of small bowel with dilated bowel up to about 5.5 cm proximal to this loop, and with the distal small bowel not dilated, shown on image 44 series 3 and image 81 series 6. There also left para umbilical abdominal herniations, 1 containing transverse colon and the other containing a dilated loop of small bowel, but not acting as a focus for  obstruction. Vascular/Lymphatic: Aortoiliac atherosclerotic vascular disease. No pathologic adenopathy observed. Reproductive: Calcification along the anterior uterine body measuring 1.6 cm, probably from a subserosal fibroid. Adnexa unremarkable. Other: Suspected prior laparotomy with some separation of the pannus into left and right components. Both demonstrate abnormal subcutaneous edema and skin thickening which is not fully included on today' s exam because of body habitus. On the left side there is a greater degree of stranding in the pannus raising suspicion for panniculitis. Musculoskeletal: Bilateral paramidline hernias, with a right ventral hernia causing obstruction of a loop of small bowel, and several left ventral hernias containing transverse colon and small bowel. There is potentially some fluid in the right hernia. Lumbar spondylosis and degenerative disc disease with grade 1 anterolisthesis at L4-5, loss of disc height at L5-S1, and potential central narrowing of the thecal sac at L4-5 due to degenerative disc disease. There notable lucencies along the acetabular roof and posterior right acetabulum, possibly geodes. Given the hazy surrounding bone density in this vicinity, fibrous dysplasia is not excluded. IMPRESSION: 1. Small bowel obstruction due to a right paracentral hernia containing a loop of small bowel with dilated the efferent component and nondilated efferent component. There is probably some fluid in this hernia sac and strangulation is not excluded. 2. There several left paramedian hernia sacs, 1 containing transverse colon and the lower hernia containing a dilated loop of small bowel. This left-sided hernia is not thought to be the site of obstruction. 3. Bilateral panniculitis left greater than right. 4. Fullness of the right collecting system and right proximal ureter possibly with slight wall thickening in the right proximal collecting system. Inflammation in this vicinity is not  excluded but I do not see urinary tract calculi 5. Clustered tiny gallstones observed. 6. Poor definition of fat planes along the pancreatic tail, quite likely from chronic scarring although active pancreatitis is not completely excluded. Atrophic pancreatic body and head likely from remote pancreatic injury. 7. Prominent dense calcification of the mitral valve with mild cardiomegaly. 8. Atelectasis in the left lower lobe. 9. Calcification situated between the anterior uterine body in urinary bladder probably a subserosal calcified fibroid. 10. Lower lumbar spondylosis and degenerative disc disease with potential mild impingement at L4-5. 11. Lucencies along the right acetabular roof and posterior acetabulum with surrounding ground-glass matrix bone, query fibrous dysplasia and/or geodes with surrounding reactive findings. Electronically Signed   By: WaltGaylyn Ronger  Liebkemann M.D.   On: 01/06/2016 19:20   [place] Abdomen Complete  01/06/2016  CLINICAL DATA:  Five day history of right-sided pain EXAM: ABDOMEN ULTRASOUND COMPLETE COMPARISON:  None. FINDINGS: Gallbladder: Within  the gallbladder, there are multiple small echogenic foci which move and shadow consistent with cholelithiasis. Largest individual gallstone measures 5 mm in length. There is no gallbladder wall thickening or pericholecystic fluid. No sonographic Murphy sign noted by sonographer. Common bile duct: Diameter: 6 mm. No intrahepatic, common hepatic, or common bile duct dilatation. Liver: No focal lesion identified. Within normal limits in parenchymal echogenicity. IVC: No abnormality visualized. Pancreas: Visualized portion unremarkable. Portions of pancreas obscured by gas. Spleen: Size and appearance within normal limits. Right Kidney: Length: 11.7 cm. Echogenicity within normal limits. No mass or visualized. There is mild fullness of the right renal pelvis and upper pole collecting system. Obstructing focus not seen. Left Kidney: Length: 13.3 cm.  Echogenicity within normal limits. No mass or hydronephrosis visualized. Abdominal aorta: No aneurysm visualized. Other findings: No demonstrable ascites. There is a hypoechoic lesion just superior to the right kidney, likely an adrenal lesion on the right. This lesion measures 2.5 x 1.3 x 2.5 cm. IMPRESSION: Cholelithiasis. No gallbladder wall thickening or pericholecystic fluid. Fullness of the right renal pelvis and upper pole collecting system. Obstructing lesion not appreciable by ultrasound. This finding may warrant noncontrast enhanced CT to assess for potential ureteral calculus. Probable right renal mass measuring 2.5 x 1.3 x 2.5 cm. CT could also be helpful to further assess this area. Study otherwise unremarkable. Note that portions of pancreas not seen due to overlying gas. Electronically Signed   By: Bretta Bang III M.D.   On: 01/06/2016 18:02   Dg Abd 2 Views  01/07/2016  CLINICAL DATA:  Small-bowel obstruction EXAM: ABDOMEN - 2 VIEW COMPARISON:  01/06/2016 FINDINGS: There are several loops of mid to left abdominal small bowel which are distended up to 5.4 cm in diameter. There is air in the stomach and there are small bowel air-fluid levels. There is minimal air into the colon. IMPRESSION: Findings again consistent with small-bowel obstruction Electronically Signed   By: Esperanza Heir M.D.   On: 01/07/2016 12:17   Dg Abd Acute W/chest  01/06/2016  CLINICAL DATA:  Right upper quadrant abdominal pain with vomiting. History of hypertension and diabetes. EXAM: DG ABDOMEN ACUTE W/ 1V CHEST COMPARISON:  None. FINDINGS: Examination is limited by body habitus. The heart size is at the upper limits of normal. There is patchy airspace disease in the left lower lobe. The right lung is clear. No pneumothorax or significant pleural effusion identified. The bowel gas pattern is normal. There is no free intraperitoneal air. 12 mm calcification overlies the lower pole of the right kidney. Left pelvic  calcifications likely vascular. Mild degenerative changes are present within the lumbar spine and at the right hip. IMPRESSION: 1. Patchy left lower lobe atelectasis or infiltrate. 2. No acute abdominal findings demonstrated. 3. Right mid and pelvic calcifications as described. The former could reflect a renal calculus or gallstone. Electronically Signed   By: Carey Bullocks M.D.   On: 01/06/2016 14:56    Scheduled Meds: . cefTRIAXone (ROCEPHIN)  IV  1 g Intravenous Q24H  . darifenacin  15 mg Oral Daily  . enoxaparin (LOVENOX) injection  40 mg Subcutaneous Q24H  . fluticasone  1 spray Each Nare Daily  . insulin aspart  0-20 Units Subcutaneous 6 times per day  . mirabegron ER  50 mg Oral Daily  . nystatin  5 mL Oral QID  . omega-3 acid ethyl esters  1 g Oral Daily  . pantoprazole (PROTONIX) IV  40 mg Intravenous Q24H  . sodium chloride  3 mL Intravenous Q12H   Continuous Infusions: . sodium chloride 75 mL/hr at 01/06/16 2203    Principal Problem:   Nausea and vomiting Active Problems:   Hyperlipidemia   Essential hypertension, benign   Persistent vomiting   Uncontrolled type 2 diabetes mellitus (HCC)   Acute kidney injury (HCC)   Morbid obesity (HCC)   Thrush   Abdominal pain   Right renal mass    Time spent: 35 minutes.     Hartley Barefoot A  [facility] Pager [phone]. If 7PM-7AM, please contact night-coverage at [email or web address], password [facility] 01/07/2016, 3:26 PM  LOS: 1 day

## 2016-01-07 NOTE — Progress Notes (Signed)
Initial Nutrition Assessment  DOCUMENTATION CODES:   Morbid obesity  INTERVENTION:  - Diet advancement as medically feasible - RD will continue to monitor for needs, including providing education after diet is advanced  NUTRITION DIAGNOSIS:   Inadequate oral intake related to inability to eat as evidenced by NPO status.  GOAL:   Patient will meet greater than or equal to 90% of their needs  MONITOR:   Diet advancement, Weight trends, Labs, Skin, I & O's  REASON FOR ASSESSMENT:   Malnutrition Screening Tool, Consult Diet education  ASSESSMENT:   70 y.o. female the PMH of overactive bladder with chronic UTIs, necrotizing pancreatitis status post partial pancreatectomy, morbid obesity, and type 2 diabetes who presents with 3 day history of N/V and abdominal pain. Pain is described as intermittent, crampy in quality, and located in lower abdomen radiating to lower back and right flank. She vomited several times (up to 6) since her symptoms began. She has been unable to tolerate oral intake. No associated diarrhea. No BMs in several days. No aggravating or alleviating factors identified. Of note, she presented to the ED yesterday and was felt to have a urinary tract infection.  Pt seen for MST and consult for education related to morbid obesity; will provide education after diet is advanced/closer to d/c given current dx. BMI indicates morbid obesity. Pt is NPO with SBO without NGT. Pt states that she last ate solid food (a steak) on 1/5. Since that time she has been able to drink liquids but has not had solid food. Pt and husband, who is at bedside, state that starting 1/6 pt was vomiting constantly and that last episode of emesis was yesterday (1/9) AM. Pt denies abdominal pain or nausea at this time. Both state that over the past few weeks pt's appetite has been declining and that she has mainly only been able to take a few bites at each meal before feeling full. Pt denies  abdominal pain or nausea with these intakes over the past few weeks but does state that food has "tasted different" although she is unable to further specify on this taste alteration.   She states that over the past 1 year she has lost 27 lbs. Pt is unsure of how much she weighed 1-2 months ago to use as comparison for CBW; weights are available in the chart. Per chart review, pt has lost 13 lbs (5% body weight) in the past 3-3.5 months which is not significant for time frame. Physical assessment shows no muscle or fat wasting at this time.   Pt unable to meet needs. Notes indicate pt has thrush. Medications reviewed. Labs reviewed;  CBGs: 179-298 mg/dL, BUN/creatinine elevated, GFR: 43.   Diet Order:  Diet NPO time specified  Skin:  Wound (see comment) (DM ulcers to bilat legs and toes)  Last BM:  1/8  Height:   Ht Readings from Last 1 Encounters:  01/06/16 5\' 4"  (1.626 m)    Weight:   Wt Readings from Last 1 Encounters:  01/06/16 278 lb 10.6 oz (126.4 kg)    Ideal Body Weight:  54.54 kg (kg)  BMI:  Body mass index is 47.81 kg/(m^2).  Estimated Nutritional Needs:   Kcal:  1400-1600  Protein:  70-85 grams  Fluid:  2 L/day  EDUCATION NEEDS:   No education needs identified at this time     Valerie [name], RD, LDN Inpatient Clinical Dietitian Pager # [phone] After hours/weekend pager # [phone]

## 2016-01-07 NOTE — Progress Notes (Signed)
PCP consulted surgery.  Gastric Tube was cancelled for now. Will continue to monitor the patient.

## 2016-01-07 NOTE — Consult Note (Signed)
General Surgery [facility]  Reason for Consult: small bowel obstruction, ventral incisional herniae  Referring Physician: [facility]  Valerie [name] is an 70 y.o. female.  HPI: patient is a 70 yo WF with 4 day history of abdominal pain, poor appetite, nausea, and emesis.  Denies fever or chills.  Admitted to medical service with abdominal pain and suspected kidney stone.  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  WBC normal.  Electrolytes normal.  Repeated lactic acid levels are normal.  Patient taking limited clear liquids.  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  Open abdominal wound which healed by secondary intention.  No other abdominal surgery.  No previous obstructions.  Patient presently denies pain.  Nausea has improved.  Watching television in room.  Past Medical History  Diagnosis Date  . Diabetes mellitus without complication ([place])   . Hypertension   . Hyperlipidemia   . Seasonal allergies   . Chronic UTI   . GERD (gastroesophageal reflux disease)   . Necrotizing pancreatitis     Past Surgical History  Procedure Laterality Date  . Pancreas surgery  1985  . Cataract extraction w/ intraocular lens  implant, bilateral      Family History  Problem Relation Age of Onset  . Cancer Father     Prostate cancer  . Cancer Mother     Gynecologic    Social History:  reports that she has quit smoking. She does not have any smokeless tobacco history on file. She reports that she does not drink alcohol. Her drug history is not on file.  Allergies: No Known Allergies  Medications: I have reviewed the patient's current medications.  Results for orders placed or performed during the hospital encounter of 01/06/16 (from the past 48  hour(s))  CBC     Status: Abnormal   Collection Time: 01/06/16  2:48 PM  Result Value Ref Range   WBC 5.6 4.0 - 10.5 K/uL   RBC 4.05 3.87 - 5.11 MIL/uL   Hemoglobin 11.7 (L) 12.0 - 15.0 g/dL   HCT 37.2 36.0 - 46.0 %   MCV 91.9 78.0 - 100.0 fL   MCH 28.9 26.0 - 34.0 pg   MCHC 31.5 30.0 - 36.0 g/dL   RDW 15.1 11.5 - 15.5 %   Platelets 327 150 - 400 K/uL  Comprehensive metabolic panel     Status: Abnormal   Collection Time: 01/06/16  2:48 PM  Result Value Ref Range   Sodium 135 135 - 145 mmol/L   Potassium 4.4 3.5 - 5.1 mmol/L   Chloride 95 (L) 101 - 111 mmol/L   CO2 24 22 - 32 mmol/L   Glucose, Bld 232 (H) 65 - 99 mg/dL   BUN 79 (H) 6 - 20 mg/dL   Creatinine, Ser 1.37 (H) 0.44 - 1.00 mg/dL   Calcium 9.9 8.9 - 10.3 mg/dL   Total Protein 8.1 6.5 - 8.1 g/dL   Albumin 3.5 3.5 - 5.0 g/dL   AST 15 15 - 41 U/L   ALT 13 (L) 14 - 54 U/L   Alkaline Phosphatase 54 38 - 126 U/L   Total Bilirubin 0.8 0.3 - 1.2 mg/dL   GFR calc non Af Amer 38 (L) >60 mL/min   GFR calc Af Wyvonnia Lora  44 (L) >60 mL/min    Comment: (NOTE) The eGFR has been calculated using the CKD EPI equation. This calculation has not been validated in all clinical situations. eGFR's persistently <60 mL/min signify possible Chronic Kidney Disease.    Anion gap 16 (H) 5 - 15  Lipase, blood     Status: None   Collection Time: 01/06/16  2:48 PM  Result Value Ref Range   Lipase 21 11 - 51 U/L  Troponin I     Status: Abnormal   Collection Time: 01/06/16  2:48 PM  Result Value Ref Range   Troponin I 0.05 (H) <0.031 ng/mL    Comment:        PERSISTENTLY INCREASED TROPONIN VALUES IN THE RANGE OF 0.04-0.49 ng/mL CAN BE SEEN IN:       -UNSTABLE ANGINA       -CONGESTIVE HEART FAILURE       -MYOCARDITIS       -CHEST TRAUMA       -ARRYHTHMIAS       -LATE PRESENTING MYOCARDIAL INFARCTION       -COPD   CLINICAL FOLLOW-UP RECOMMENDED.   I-Stat CG4 Lactic Acid, ED     Status: None   Collection Time: 01/06/16  3:22 PM  Result  Value Ref Range   Lactic Acid, Venous 1.42 0.5 - 2.0 mmol/L  Urinalysis, Routine w reflex microscopic (not at [facility])     Status: Abnormal   Collection Time: 01/06/16  3:33 PM  Result Value Ref Range   Color, Urine YELLOW YELLOW   APPearance CLOUDY (A) CLEAR   Specific Gravity, Urine 1.026 1.005 - 1.030   pH 5.5 5.0 - 8.0   Glucose, UA 100 (A) NEGATIVE mg/dL   Hgb urine dipstick LARGE (A) NEGATIVE   Bilirubin Urine SMALL (A) NEGATIVE   Ketones, ur NEGATIVE NEGATIVE mg/dL   Protein, ur 30 (A) NEGATIVE mg/dL   Nitrite NEGATIVE NEGATIVE   Leukocytes, UA LARGE (A) NEGATIVE  Urine microscopic-add on     Status: Abnormal   Collection Time: 01/06/16  3:33 PM  Result Value Ref Range   Squamous Epithelial / LPF 6-30 (A) NONE SEEN   WBC, UA TOO NUMEROUS TO COUNT 0 - 5 WBC/hpf   RBC / HPF TOO NUMEROUS TO COUNT 0 - 5 RBC/hpf   Bacteria, UA FEW (A) NONE SEEN  Glucose, capillary     Status: Abnormal   Collection Time: 01/06/16  9:56 PM  Result Value Ref Range   Glucose-Capillary 298 (H) 65 - 99 mg/dL  Lactic acid, plasma     Status: None   Collection Time: 01/06/16 10:14 PM  Result Value Ref Range   Lactic Acid, Venous 1.4 0.5 - 2.0 mmol/L    Ct Abdomen Pelvis Wo Contrast  01/06/2016  CLINICAL DATA:  Right-sided abdominal pain. Possible calculi on conventional radiographs. History of necrotizing pancreatitis and diabetes. History pancreatic surgery. EXAM: CT ABDOMEN AND PELVIS WITHOUT CONTRAST TECHNIQUE: Multidetector CT imaging of the abdomen and pelvis was performed following the standard protocol without IV contrast. COMPARISON:  01/06/2016 ultrasounds and conventional radiographs FINDINGS: Body habitus reduces diagnostic sensitivity and specificity. Lower chest: Very dense mitral valve calcification observed. Mosaic attenuation at the left lung base with associated atelectasis. Mild cardiomegaly. Hepatobiliary: Clustered small gallstones measuring up to 6 mm in diameter are settled dependently  in the gallbladder. None of these are directly observed to be in the common bile duct or common hepatic duct. Punctate calcification along the capsule of the inferior  tip of the right hepatic lobe, likely due to remote inflammation. Pancreas: Atrophic pancreatic body and pancreatic head. Poor definition of fat planes around the pancreatic tail. Aortoiliac atherosclerotic vascular disease. In the vicinity of the spleen, possibly from acute inflammation or chronic scarring. Spleen: Unremarkable Adrenals/Urinary Tract: Chronic appearing calcification in the upper portion of the left adrenal gland. Right adrenal mass measuring 3.1 by 2.1 cm has internal density of 14 Hounsfield units, technically nonspecific. Mild thinning of the renal cortex bilaterally with prominence of renal sinus adipose tissue. Prominent right collecting system and proximal right hydroureter. Suspected wall thickening in the right collecting system. Normal left ureter. No urinary tract calculi are identified. Tiny amount of gas in the urinary bladder. Query recent catheterization. Stomach/Bowel: Right periumbilical hernia containing a loop of small bowel with dilated bowel up to about 5.5 cm proximal to this loop, and with the distal small bowel not dilated, shown on image 44 series 3 and image 81 series 6. There also left para umbilical abdominal herniations, 1 containing transverse colon and the other containing a dilated loop of small bowel, but not acting as a focus for obstruction. Vascular/Lymphatic: Aortoiliac atherosclerotic vascular disease. No pathologic adenopathy observed. Reproductive: Calcification along the anterior uterine body measuring 1.6 cm, probably from a subserosal fibroid. Adnexa unremarkable. Other: Suspected prior laparotomy with some separation of the pannus into left and right components. Both demonstrate abnormal subcutaneous edema and skin thickening which is not fully included on today' s exam because of body  habitus. On the left side there is a greater degree of stranding in the pannus raising suspicion for panniculitis. Musculoskeletal: Bilateral paramidline hernias, with a right ventral hernia causing obstruction of a loop of small bowel, and several left ventral hernias containing transverse colon and small bowel. There is potentially some fluid in the right hernia. Lumbar spondylosis and degenerative disc disease with grade 1 anterolisthesis at L4-5, loss of disc height at L5-S1, and potential central narrowing of the thecal sac at L4-5 due to degenerative disc disease. There notable lucencies along the acetabular roof and posterior right acetabulum, possibly geodes. Given the hazy surrounding bone density in this vicinity, fibrous dysplasia is not excluded. IMPRESSION: 1. Small bowel obstruction due to a right paracentral hernia containing a loop of small bowel with dilated the efferent component and nondilated efferent component. There is probably some fluid in this hernia sac and strangulation is not excluded. 2. There several left paramedian hernia sacs, 1 containing transverse colon and the lower hernia containing a dilated loop of small bowel. This left-sided hernia is not thought to be the site of obstruction. 3. Bilateral panniculitis left greater than right. 4. Fullness of the right collecting system and right proximal ureter possibly with slight wall thickening in the right proximal collecting system. Inflammation in this vicinity is not excluded but I do not see urinary tract calculi 5. Clustered tiny gallstones observed. 6. Poor definition of fat planes along the pancreatic tail, quite likely from chronic scarring although active pancreatitis is not completely excluded. Atrophic pancreatic body and head likely from remote pancreatic injury. 7. Prominent dense calcification of the mitral valve with mild cardiomegaly. 8. Atelectasis in the left lower lobe. 9. Calcification situated between the anterior  uterine body in urinary bladder probably a subserosal calcified fibroid. 10. Lower lumbar spondylosis and degenerative disc disease with potential mild impingement at L4-5. 11. Lucencies along the right acetabular roof and posterior acetabulum with surrounding ground-glass matrix bone, query fibrous dysplasia and/or geodes with  surrounding reactive findings. Electronically Signed   By: Van Clines M.D.   On: 01/06/2016 19:20   US Abdomen Complete  01/06/2016  CLINICAL DATA:  Five day history of right-sided pain EXAM: ABDOMEN ULTRASOUND COMPLETE COMPARISON:  None. FINDINGS: Gallbladder: Within the gallbladder, there are multiple small echogenic foci which move and shadow consistent with cholelithiasis. Largest individual gallstone measures 5 mm in length. There is no gallbladder wall thickening or pericholecystic fluid. No sonographic Murphy sign noted by sonographer. Common bile duct: Diameter: 6 mm. No intrahepatic, common hepatic, or common bile duct dilatation. Liver: No focal lesion identified. Within normal limits in parenchymal echogenicity. IVC: No abnormality visualized. Pancreas: Visualized portion unremarkable. Portions of pancreas obscured by gas. Spleen: Size and appearance within normal limits. Right Kidney: Length: 11.7 cm. Echogenicity within normal limits. No mass or visualized. There is mild fullness of the right renal pelvis and upper pole collecting system. Obstructing focus not seen. Left Kidney: Length: 13.3 cm. Echogenicity within normal limits. No mass or hydronephrosis visualized. Abdominal aorta: No aneurysm visualized. Other findings: No demonstrable ascites. There is a hypoechoic lesion just superior to the right kidney, likely an adrenal lesion on the right. This lesion measures 2.5 x 1.3 x 2.5 cm. IMPRESSION: Cholelithiasis. No gallbladder wall thickening or pericholecystic fluid. Fullness of the right renal pelvis and upper pole collecting system. Obstructing lesion not  appreciable by ultrasound. This finding may warrant noncontrast enhanced CT to assess for potential ureteral calculus. Probable right renal mass measuring 2.5 x 1.3 x 2.5 cm. CT could also be helpful to further assess this area. Study otherwise unremarkable. Note that portions of pancreas not seen due to overlying gas. Electronically Signed   By: Lowella Grip III M.D.   On: 01/06/2016 18:02   Dg Abd Acute W/chest  01/06/2016  CLINICAL DATA:  Right upper quadrant abdominal pain with vomiting. History of hypertension and diabetes. EXAM: DG ABDOMEN ACUTE W/ 1V CHEST COMPARISON:  None. FINDINGS: Examination is limited by body habitus. The heart size is at the upper limits of normal. There is patchy airspace disease in the left lower lobe. The right lung is clear. No pneumothorax or significant pleural effusion identified. The bowel gas pattern is normal. There is no free intraperitoneal air. 12 mm calcification overlies the lower pole of the right kidney. Left pelvic calcifications likely vascular. Mild degenerative changes are present within the lumbar spine and at the right hip. IMPRESSION: 1. Patchy left lower lobe atelectasis or infiltrate. 2. No acute abdominal findings demonstrated. 3. Right mid and pelvic calcifications as described. The former could reflect a renal calculus or gallstone. Electronically Signed   By: Richardean Sale M.D.   On: 01/06/2016 14:56    Review of Systems  Constitutional: Negative for fever and chills.  HENT: Negative.   Eyes: Negative.   Respiratory: Negative.   Cardiovascular: Negative.   Gastrointestinal: Positive for nausea, vomiting and abdominal pain.  Genitourinary: Negative.   Musculoskeletal: Negative.   Skin: Negative.   Neurological: Positive for weakness.  Endo/Heme/Allergies: Negative.   Psychiatric/Behavioral: Negative.    Blood pressure 123/64, pulse 106, temperature 98.2 F (36.8 C), temperature source Oral, resp. rate 24, height _0  (1.626 m),  weight 126.4 kg (278 lb 10.6 oz), SpO2 96 %. Physical Exam  Constitutional: She is oriented to person, place, and time. No distress.  HENT:  Head: Normocephalic and atraumatic.  Right Ear: External ear normal.  Left Ear: External ear normal.  Eyes: Conjunctivae are normal. Pupils are  equal, round, and reactive to light. No scleral icterus.  Neck: Normal range of motion. Neck supple. No tracheal deviation present. No thyromegaly present.  Cardiovascular: Normal rate, regular rhythm and normal heart sounds.   No murmur heard. Respiratory: Effort normal and breath sounds normal. No respiratory distress. She has no wheezes.  GI: Soft. Bowel sounds are normal. She exhibits no distension and no mass. There is tenderness (mild right mid abdomen and RLQ). There is no rebound and no guarding.  Complex midline wound completely epithelialized.  Bilateral ventral incisional herniae difficult to palpate, bowel sounds present in herniae, soft, non-tender, but not certain whether reducible or not by physical exam due to obesity.  Musculoskeletal: Normal range of motion. She exhibits edema. She exhibits no tenderness.  Neurological: She is alert and oriented to person, place, and time.  Skin: Skin is warm and dry. She is not diaphoretic.  Psychiatric: She has a normal mood and affect. Her behavior is normal.    Assessment/Plan: Small bowel obstruction secondary to ventral incisional hernia Morbid obesity Hx of open abdomen   Will make patient NPO except for ice chips  Hold off on NG tube for now - if N/V recurs, place NG tube - discussed with nursing  Will follow with you - operative intervention would be very challenging given the complex nature of the abdominal herniae and midline wound in the face of super morbid obesity and diabetes.  No need for emergent operative intervention at this time.  Earnstine Regal, MD, [facility] Office: [place] 01/07/2016, 12:04 AM

## 2016-01-07 NOTE — Progress Notes (Signed)
Patient ID: Valerie [name], female   DOB: [birth year], 70 y.o.   MRN: [id number]     [facility]      [address] [place], [place] [address]    Phone: [phone] FAX: [phone]     Subjective: No n/v.  Has been having problems with constipation and taking fiber.  Objective:  Vital signs:  Filed Vitals:   01/06/16 1819 01/06/16 1958 01/06/16 2123 01/07/16 0446  BP: 143/63 123/71 123/64 128/48  Pulse: 107 106 106 103  Temp:  97.9 F (36.6 C) 98.2 F (36.8 C) 98 F (36.7 C)  TempSrc:  Oral Oral Oral  Resp: _0 Height:   _1  (1.626 m)   Weight:   126.4 kg (278 lb 10.6 oz)   SpO2: 95% 96% 96% 96%    Last BM Date: 01/05/16 (per patient. )  Intake/Output   Yesterday:  01/09 0701 - 01/10 0700 In: -  Out: 1000 [Urine:1000] This shift:     Physical Exam: General: Pt awake/alert/oriented x4 in no acute distress  Abdomen: Soft.  Nondistended.  2 large hernias, soft, unable to reduce fully, no erythema.   Problem List:   Principal Problem:   Nausea and vomiting Active Problems:   Hyperlipidemia   Essential hypertension, benign   Persistent vomiting   Uncontrolled type 2 diabetes mellitus (HCC)   Acute kidney injury ([place])   Morbid obesity ([place])   Thrush   Abdominal pain   Right renal mass    Results:   Labs: Results for orders placed or performed during the hospital encounter of 01/06/16 (from the past 48 hour(s))  CBC     Status: Abnormal   Collection Time: 01/06/16  2:48 PM  Result Value Ref Range   WBC 5.6 4.0 - 10.5 K/uL   RBC 4.05 3.87 - 5.11 MIL/uL   Hemoglobin 11.7 (L) 12.0 - 15.0 g/dL   HCT 37.2 36.0 - 46.0 %   MCV 91.9 78.0 - 100.0 fL   MCH 28.9 26.0 - 34.0 pg   MCHC 31.5 30.0 - 36.0 g/dL   RDW 15.1 11.5 - 15.5 %   Platelets 327 150 - 400 K/uL  Comprehensive metabolic panel     Status: Abnormal   Collection Time: 01/06/16  2:48 PM  Result Value Ref Range   Sodium 135 135 - 145  mmol/L   Potassium 4.4 3.5 - 5.1 mmol/L   Chloride 95 (L) 101 - 111 mmol/L   CO2 24 22 - 32 mmol/L   Glucose, Bld 232 (H) 65 - 99 mg/dL   BUN 79 (H) 6 - 20 mg/dL   Creatinine, Ser 1.37 (H) 0.44 - 1.00 mg/dL   Calcium 9.9 8.9 - 10.3 mg/dL   Total Protein 8.1 6.5 - 8.1 g/dL   Albumin 3.5 3.5 - 5.0 g/dL   AST 15 15 - 41 U/L   ALT 13 (L) 14 - 54 U/L   Alkaline Phosphatase 54 38 - 126 U/L   Total Bilirubin 0.8 0.3 - 1.2 mg/dL   GFR calc non Af Amer 38 (L) >60 mL/min   GFR calc Af Amer 44 (L) >60 mL/min    Comment: (NOTE) The eGFR has been calculated using the CKD EPI equation. This calculation has not been validated in all clinical situations. eGFR's persistently <60 mL/min signify possible Chronic Kidney Disease.    Anion gap 16 (H) 5 - 15  Lipase, blood     Status:  None   Collection Time: 01/06/16  2:48 PM  Result Value Ref Range   Lipase 21 11 - 51 U/L  Troponin I     Status: Abnormal   Collection Time: 01/06/16  2:48 PM  Result Value Ref Range   Troponin I 0.05 (H) <0.031 ng/mL    Comment:        PERSISTENTLY INCREASED TROPONIN VALUES IN THE RANGE OF 0.04-0.49 ng/mL CAN BE SEEN IN:       -UNSTABLE ANGINA       -CONGESTIVE HEART FAILURE       -MYOCARDITIS       -CHEST TRAUMA       -ARRYHTHMIAS       -LATE PRESENTING MYOCARDIAL INFARCTION       -COPD   CLINICAL FOLLOW-UP RECOMMENDED.   I-Stat CG4 Lactic Acid, ED     Status: None   Collection Time: 01/06/16  3:22 PM  Result Value Ref Range   Lactic Acid, Venous 1.42 0.5 - 2.0 mmol/L  Urinalysis, Routine w reflex microscopic (not at [facility])     Status: Abnormal   Collection Time: 01/06/16  3:33 PM  Result Value Ref Range   Color, Urine YELLOW YELLOW   APPearance CLOUDY (A) CLEAR   Specific Gravity, Urine 1.026 1.005 - 1.030   pH 5.5 5.0 - 8.0   Glucose, UA 100 (A) NEGATIVE mg/dL   Hgb urine dipstick LARGE (A) NEGATIVE   Bilirubin Urine SMALL (A) NEGATIVE   Ketones, ur NEGATIVE NEGATIVE mg/dL   Protein, ur 30 (A)  NEGATIVE mg/dL   Nitrite NEGATIVE NEGATIVE   Leukocytes, UA LARGE (A) NEGATIVE  Urine microscopic-add on     Status: Abnormal   Collection Time: 01/06/16  3:33 PM  Result Value Ref Range   Squamous Epithelial / LPF 6-30 (A) NONE SEEN   WBC, UA TOO NUMEROUS TO COUNT 0 - 5 WBC/hpf   RBC / HPF TOO NUMEROUS TO COUNT 0 - 5 RBC/hpf   Bacteria, UA FEW (A) NONE SEEN  Glucose, capillary     Status: Abnormal   Collection Time: 01/06/16  9:56 PM  Result Value Ref Range   Glucose-Capillary 298 (H) 65 - 99 mg/dL  Lactic acid, plasma     Status: None   Collection Time: 01/06/16 10:14 PM  Result Value Ref Range   Lactic Acid, Venous 1.4 0.5 - 2.0 mmol/L  Basic metabolic panel     Status: Abnormal   Collection Time: 01/07/16  3:55 AM  Result Value Ref Range   Sodium 136 135 - 145 mmol/L   Potassium 4.3 3.5 - 5.1 mmol/L   Chloride 101 101 - 111 mmol/L   CO2 23 22 - 32 mmol/L   Glucose, Bld 294 (H) 65 - 99 mg/dL   BUN 58 (H) 6 - 20 mg/dL   Creatinine, Ser 1.24 (H) 0.44 - 1.00 mg/dL   Calcium 9.2 8.9 - 10.3 mg/dL   GFR calc non Af Amer 43 (L) >60 mL/min   GFR calc Af Amer 49 (L) >60 mL/min    Comment: (NOTE) The eGFR has been calculated using the CKD EPI equation. This calculation has not been validated in all clinical situations. eGFR's persistently <60 mL/min signify possible Chronic Kidney Disease.    Anion gap 12 5 - 15  Glucose, capillary     Status: Abnormal   Collection Time: 01/07/16  4:51 AM  Result Value Ref Range   Glucose-Capillary 248 (H) 65 - 99 mg/dL  Glucose, capillary  Status: Abnormal   Collection Time: 01/07/16  7:32 AM  Result Value Ref Range   Glucose-Capillary 237 (H) 65 - 99 mg/dL    Imaging / Studies: Ct Abdomen Pelvis Wo Contrast  01/06/2016  CLINICAL DATA:  Right-sided abdominal pain. Possible calculi on conventional radiographs. History of necrotizing pancreatitis and diabetes. History pancreatic surgery. EXAM: CT ABDOMEN AND PELVIS WITHOUT CONTRAST  TECHNIQUE: Multidetector CT imaging of the abdomen and pelvis was performed following the standard protocol without IV contrast. COMPARISON:  01/06/2016 ultrasounds and conventional radiographs FINDINGS: Body habitus reduces diagnostic sensitivity and specificity. Lower chest: Very dense mitral valve calcification observed. Mosaic attenuation at the left lung base with associated atelectasis. Mild cardiomegaly. Hepatobiliary: Clustered small gallstones measuring up to 6 mm in diameter are settled dependently in the gallbladder. None of these are directly observed to be in the common bile duct or common hepatic duct. Punctate calcification along the capsule of the inferior tip of the right hepatic lobe, likely due to remote inflammation. Pancreas: Atrophic pancreatic body and pancreatic head. Poor definition of fat planes around the pancreatic tail. Aortoiliac atherosclerotic vascular disease. In the vicinity of the spleen, possibly from acute inflammation or chronic scarring. Spleen: Unremarkable Adrenals/Urinary Tract: Chronic appearing calcification in the upper portion of the left adrenal gland. Right adrenal mass measuring 3.1 by 2.1 cm has internal density of 14 Hounsfield units, technically nonspecific. Mild thinning of the renal cortex bilaterally with prominence of renal sinus adipose tissue. Prominent right collecting system and proximal right hydroureter. Suspected wall thickening in the right collecting system. Normal left ureter. No urinary tract calculi are identified. Tiny amount of gas in the urinary bladder. Query recent catheterization. Stomach/Bowel: Right periumbilical hernia containing a loop of small bowel with dilated bowel up to about 5.5 cm proximal to this loop, and with the distal small bowel not dilated, shown on image 44 series 3 and image 81 series 6. There also left para umbilical abdominal herniations, 1 containing transverse colon and the other containing a dilated loop of small  bowel, but not acting as a focus for obstruction. Vascular/Lymphatic: Aortoiliac atherosclerotic vascular disease. No pathologic adenopathy observed. Reproductive: Calcification along the anterior uterine body measuring 1.6 cm, probably from a subserosal fibroid. Adnexa unremarkable. Other: Suspected prior laparotomy with some separation of the pannus into left and right components. Both demonstrate abnormal subcutaneous edema and skin thickening which is not fully included on today' s exam because of body habitus. On the left side there is a greater degree of stranding in the pannus raising suspicion for panniculitis. Musculoskeletal: Bilateral paramidline hernias, with a right ventral hernia causing obstruction of a loop of small bowel, and several left ventral hernias containing transverse colon and small bowel. There is potentially some fluid in the right hernia. Lumbar spondylosis and degenerative disc disease with grade 1 anterolisthesis at L4-5, loss of disc height at L5-S1, and potential central narrowing of the thecal sac at L4-5 due to degenerative disc disease. There notable lucencies along the acetabular roof and posterior right acetabulum, possibly geodes. Given the hazy surrounding bone density in this vicinity, fibrous dysplasia is not excluded. IMPRESSION: 1. Small bowel obstruction due to a right paracentral hernia containing a loop of small bowel with dilated the efferent component and nondilated efferent component. There is probably some fluid in this hernia sac and strangulation is not excluded. 2. There several left paramedian hernia sacs, 1 containing transverse colon and the lower hernia containing a dilated loop of small bowel. This left-sided  hernia is not thought to be the site of obstruction. 3. Bilateral panniculitis left greater than right. 4. Fullness of the right collecting system and right proximal ureter possibly with slight wall thickening in the right proximal collecting system.  Inflammation in this vicinity is not excluded but I do not see urinary tract calculi 5. Clustered tiny gallstones observed. 6. Poor definition of fat planes along the pancreatic tail, quite likely from chronic scarring although active pancreatitis is not completely excluded. Atrophic pancreatic body and head likely from remote pancreatic injury. 7. Prominent dense calcification of the mitral valve with mild cardiomegaly. 8. Atelectasis in the left lower lobe. 9. Calcification situated between the anterior uterine body in urinary bladder probably a subserosal calcified fibroid. 10. Lower lumbar spondylosis and degenerative disc disease with potential mild impingement at L4-5. 11. Lucencies along the right acetabular roof and posterior acetabulum with surrounding ground-glass matrix bone, query fibrous dysplasia and/or geodes with surrounding reactive findings. Electronically Signed   By: Van Clines M.D.   On: 01/06/2016 19:20   US Abdomen Complete  01/06/2016  CLINICAL DATA:  Five day history of right-sided pain EXAM: ABDOMEN ULTRASOUND COMPLETE COMPARISON:  None. FINDINGS: Gallbladder: Within the gallbladder, there are multiple small echogenic foci which move and shadow consistent with cholelithiasis. Largest individual gallstone measures 5 mm in length. There is no gallbladder wall thickening or pericholecystic fluid. No sonographic Murphy sign noted by sonographer. Common bile duct: Diameter: 6 mm. No intrahepatic, common hepatic, or common bile duct dilatation. Liver: No focal lesion identified. Within normal limits in parenchymal echogenicity. IVC: No abnormality visualized. Pancreas: Visualized portion unremarkable. Portions of pancreas obscured by gas. Spleen: Size and appearance within normal limits. Right Kidney: Length: 11.7 cm. Echogenicity within normal limits. No mass or visualized. There is mild fullness of the right renal pelvis and upper pole collecting system. Obstructing focus not seen.  Left Kidney: Length: 13.3 cm. Echogenicity within normal limits. No mass or hydronephrosis visualized. Abdominal aorta: No aneurysm visualized. Other findings: No demonstrable ascites. There is a hypoechoic lesion just superior to the right kidney, likely an adrenal lesion on the right. This lesion measures 2.5 x 1.3 x 2.5 cm. IMPRESSION: Cholelithiasis. No gallbladder wall thickening or pericholecystic fluid. Fullness of the right renal pelvis and upper pole collecting system. Obstructing lesion not appreciable by ultrasound. This finding may warrant noncontrast enhanced CT to assess for potential ureteral calculus. Probable right renal mass measuring 2.5 x 1.3 x 2.5 cm. CT could also be helpful to further assess this area. Study otherwise unremarkable. Note that portions of pancreas not seen due to overlying gas. Electronically Signed   By: Lowella Grip III M.D.   On: 01/06/2016 18:02   Dg Abd Acute W/chest  01/06/2016  CLINICAL DATA:  Right upper quadrant abdominal pain with vomiting. History of hypertension and diabetes. EXAM: DG ABDOMEN ACUTE W/ 1V CHEST COMPARISON:  None. FINDINGS: Examination is limited by body habitus. The heart size is at the upper limits of normal. There is patchy airspace disease in the left lower lobe. The right lung is clear. No pneumothorax or significant pleural effusion identified. The bowel gas pattern is normal. There is no free intraperitoneal air. 12 mm calcification overlies the lower pole of the right kidney. Left pelvic calcifications likely vascular. Mild degenerative changes are present within the lumbar spine and at the right hip. IMPRESSION: 1. Patchy left lower lobe atelectasis or infiltrate. 2. No acute abdominal findings demonstrated. 3. Right mid and pelvic calcifications as  described. The former could reflect a renal calculus or gallstone. Electronically Signed   By: Richardean Sale M.D.   On: 01/06/2016 14:56    Medications / Allergies:  Scheduled Meds: .  cefTRIAXone (ROCEPHIN)  IV  1 g Intravenous Q24H  . darifenacin  15 mg Oral Daily  . enoxaparin (LOVENOX) injection  40 mg Subcutaneous Q24H  . fluticasone  1 spray Each Nare Daily  . insulin aspart  0-20 Units Subcutaneous 6 times per day  . mirabegron ER  50 mg Oral Daily  . nystatin  5 mL Oral QID  . omega-3 acid ethyl esters  1 g Oral Daily  . pantoprazole (PROTONIX) IV  40 mg Intravenous Q24H  . sodium chloride  3 mL Intravenous Q12H   Continuous Infusions: . sodium chloride 75 mL/hr at 01/06/16 2203   PRN Meds:.acetaminophen **OR** acetaminophen, ondansetron **OR** ondansetron (ZOFRAN) IV, oxyCODONE  Antibiotics: Anti-infectives    Start     Dose/Rate Route Frequency Ordered Stop   01/07/16 1600  cefTRIAXone (ROCEPHIN) 1 g in dextrose 5 % 50 mL IVPB     1 g 100 mL/hr over 30 Minutes Intravenous Every 24 hours 01/06/16 2125     01/06/16 1645  cefTRIAXone (ROCEPHIN) 1 g in dextrose 5 % 50 mL IVPB     1 g 100 mL/hr over 30 Minutes Intravenous  Once 01/06/16 1638 01/06/16 1733   01/06/16 1645  azithromycin (ZITHROMAX) 500 mg in dextrose 5 % 250 mL IVPB     500 mg 250 mL/hr over 60 Minutes Intravenous  Once 01/06/16 1638 01/06/16 1946        Assessment/Plan 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 Dalbert Batman SBO secondary to ventral incisional hernia-improved clinically, check AXR.   VTE prophylaxis-SCD/lovenox FEN-clears  Erby Pian, [facility] Pager [phone]) For consults and floor pages call [phone](7A-4:30P)  01/07/2016 10:16 AM

## 2016-01-07 NOTE — Progress Notes (Signed)
She feels better this afternoon.  No abdominal pain or nausea.  No flatus.  X-rays reviewed.  Will recheck these tomorrow.

## 2016-01-07 NOTE — Progress Notes (Signed)
Follow-up: Notified by RN regarding multiple findings on Ct abd/pelvis w/o cm. Most concerning is SBO d/t (R) paracentral hernia containing loops of bowel, likely fluid filled and strangulation not excluded. Pt currently w/ intermittent RUQ and RLQ abd pain. No n/v. Discussed pt w/ Dr Gerrit FriendsGerkin w/ general surgery service who agreed to see pt tonight. Recommended placement of NGT. Will continue to monitor closely on telemetry.   Leanne ChangKatherine P. Schorr, NP-C [facility] Pager [phone]

## 2016-01-07 NOTE — Evaluation (Signed)
Physical Therapy Evaluation Patient Details Name: Valerie DurhamBeverly Piano MRN: [id number] DOB: [birth year] Today's Date: 01/07/2016   History of Present Illness  patient is a 70 yo WF with 4 day history of abdominal pain, poor appetite, nausea, and emesis. Denies fever or chills. Admitted to medical service with abdominal pain and suspected kidney stone. 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  Clinical Impression  Pt admitted with above diagnosis. Pt currently with functional limitations due to the deficits listed below (see PT Problem List).  Pt will benefit from skilled PT to increase their independence and safety with mobility to allow discharge to the venue listed below.  Pt needing cues for safety near end of gait due to fatigue and wanting to sit too early. Pt has multi-level home and will need continued strengthening in acute care.  At this time, do not feel she will need HHPT, but will continue to monitor.  Pt was very sleepy today which may have impacted her functional mobility.     Follow Up Recommendations Supervision - Intermittent    Equipment Recommendations  None recommended by PT    Recommendations for Other Services       Precautions / Restrictions Precautions Precautions: None Restrictions Weight Bearing Restrictions: No      Mobility  Bed Mobility Overal bed mobility: Needs Assistance Bed Mobility: Supine to Sit     Supine to sit: Modified independent (Device/Increase time);HOB elevated     General bed mobility comments: HOB elevated with rail  Transfers Overall transfer level: Needs assistance Equipment used: Rolling walker (2 wheeled) Transfers: Sit to/from Stand Sit to Stand: Supervision         General transfer comment: cues for hand placement only. no dizziness reported on  standing  Ambulation/Gait Ambulation/Gait assistance: Min guard Ambulation Distance (Feet): 20 Feet Assistive device: Rolling walker (2 wheeled) Gait Pattern/deviations: Decreased stance time - left;Trunk flexed     General Gait Details: Decreased stance on L LE with pt beginning to ambulate very quickly closer she got to the chair and needed cues for safety.  Stairs            Wheelchair Mobility    Modified Rankin (Stroke Patients Only)       Balance Overall balance assessment: Needs assistance           Standing balance-Leahy Scale: Fair Standing balance comment: can stand without UE support                             Pertinent Vitals/Pain Pain Assessment: No/denies pain    Home Living Family/patient expects to be discharged to:: Private residence Living Arrangements: Spouse/significant other;Children Available Help at Discharge: Family;Available 24 hours/day Type of Home: House Home Access: Stairs to enter   Entergy CorporationEntrance Stairs-Number of Steps: 1 Home Layout: Multi-level Home Equipment: Walker - 4 wheels;Shower seat - built in      Prior Function Level of Independence: Independent;Independent with assistive device(s)         Comments: Will take rollator for shopping and longer distances in case she has to sit     Hand Dominance   Dominant Hand: Right    Extremity/Trunk Assessment   Upper Extremity Assessment: Overall WFL for tasks assessed  Lower Extremity Assessment: Overall WFL for tasks assessed;LLE deficits/detail   LLE Deficits / Details: pt reports some issues- pt with hx of venous stasis issues and healed ulcer on 2nd toe.     Communication   Communication: No difficulties  Cognition Arousal/Alertness: Awake/alert Behavior During Therapy: WFL for tasks assessed/performed Overall Cognitive Status: Within Functional Limits for tasks assessed                      General Comments General comments  (skin integrity, edema, etc.): Pt awake, but sleepy throughout session. Room was very cold and pt felt that is was hot.    Exercises        Assessment/Plan    PT Assessment Patient needs continued PT services  PT Diagnosis Difficulty walking;Generalized weakness   PT Problem List Decreased activity tolerance;Decreased balance;Decreased mobility;Decreased strength  PT Treatment Interventions Gait training;Stair training;Functional mobility training;Therapeutic activities;Therapeutic exercise;Balance training   PT Goals (Current goals can be found in the Care Plan section) Acute Rehab PT Goals Patient Stated Goal: feel better PT Goal Formulation: With patient Time For Goal Achievement: 01/21/16 Potential to Achieve Goals: Good    Frequency Min 3X/week   Barriers to discharge Inaccessible home environment split level home    Co-evaluation               End of Session Equipment Utilized During Treatment: Gait belt Activity Tolerance: Patient tolerated treatment well;Patient limited by fatigue Patient left: in chair;with call bell/phone within reach Nurse Communication: Mobility status         Time: 1350-1414 PT Time Calculation (min) (ACUTE ONLY): 24 min   Charges:   PT Evaluation $PT Eval Moderate Complexity: 1 Procedure PT Treatments $Gait Training: 8-22 mins   PT G Codes:        Mercadez Heitman LUBECK 01/07/2016, 2:41 PM

## 2016-01-08 ENCOUNTER — Telehealth (HOSPITAL_COMMUNITY): Payer: Self-pay

## 2016-01-08 ENCOUNTER — Inpatient Hospital Stay (HOSPITAL_COMMUNITY): Payer: Medicare Other

## 2016-01-08 LAB — BASIC METABOLIC PANEL
ANION GAP: 8 (ref 5–15)
BUN: 35 mg/dL — ABNORMAL HIGH (ref 6–20)
CHLORIDE: 110 mmol/L (ref 101–111)
CO2: 23 mmol/L (ref 22–32)
Calcium: 8.9 mg/dL (ref 8.9–10.3)
Creatinine, Ser: 0.84 mg/dL (ref 0.44–1.00)
GFR calc non Af Amer: >60 mL/min (ref >60)
Glucose, Bld: 163 mg/dL — ABNORMAL HIGH (ref 65–99)
POTASSIUM: 4.6 mmol/L (ref 3.5–5.1)
Sodium: 141 mmol/L (ref 135–145)

## 2016-01-08 LAB — GLUCOSE, CAPILLARY
GLUCOSE-CAPILLARY: 107 mg/dL — AB (ref 65–99)
GLUCOSE-CAPILLARY: 223 mg/dL — AB (ref 65–99)
GLUCOSE-CAPILLARY: 256 mg/dL — AB (ref 65–99)
Glucose-Capillary: 149 mg/dL — ABNORMAL HIGH (ref 65–99)
Glucose-Capillary: 159 mg/dL — ABNORMAL HIGH (ref 65–99)
Glucose-Capillary: 177 mg/dL — ABNORMAL HIGH (ref 65–99)
Glucose-Capillary: 261 mg/dL — ABNORMAL HIGH (ref 65–99)

## 2016-01-08 LAB — HEMOGLOBIN A1C
HEMOGLOBIN A1C: 6.4 % — AB (ref 4.8–5.6)
MEAN PLASMA GLUCOSE: 137 mg/dL

## 2016-01-08 LAB — CBC
HEMATOCRIT: 30.2 % — AB (ref 36.0–46.0)
HEMOGLOBIN: 9.8 g/dL — AB (ref 12.0–15.0)
MCH: 29.9 pg (ref 26.0–34.0)
MCHC: 32.5 g/dL (ref 30.0–36.0)
MCV: 92.1 fL (ref 78.0–100.0)
Platelets: 247 10*3/uL (ref 150–400)
RBC: 3.28 MIL/uL — ABNORMAL LOW (ref 3.87–5.11)
RDW: 15.1 % (ref 11.5–15.5)
WBC: 5.3 10*3/uL (ref 4.0–10.5)

## 2016-01-08 MED ORDER — HYDROCERIN EX CREA
TOPICAL_CREAM | Freq: Two times a day (BID) | CUTANEOUS | Status: DC
  Administered 2016-01-08 – 2016-01-09 (×4): via TOPICAL
  Administered 2016-01-10: 1 via TOPICAL
  Administered 2016-01-10 – 2016-01-13 (×6): via TOPICAL
  Filled 2016-01-08: qty 113

## 2016-01-08 NOTE — Progress Notes (Signed)
[facility] PROGRESS NOTE  Valerie [name] ZOX:[id number] DOB: 13-Jun-1946 DOA: 01/06/2016 PCP: Garth Schlatter, MD  Assessment/Plan: Valerie [name] is an 70 y.o. female the PMH of overactive bladder with chronic UTIs, necrotizing pancreatitis status post partial pancreatectomy, morbid obesity, and type 2 diabetes who presents with 3 day history of N/V and abdominal pain. Pain is described as intermittent, crampy in quality, and located in lower abdomen radiating to lower back and right flank. She vomited several times (up to 6) since her symptoms began. She has been unable to tolerate oral intake. No associated diarrhea. No BMs in several days. No aggravating or alleviating factors identified. Of note, she presented to the ED yesterday and was felt to have a urinary tract infection.  Nausea and vomiting, abdominal pain ;SBO.  - CT abdomen: with finding consistent with SBO.  -continue with ceftriaxone  -X ray with some improvement. Started on clear diet./   Acute kidney injury (HCC)/stage III chronic kidney disease - Baseline creatinine is 1.34. Creatinine yesterday was 1.63, but has improved to her usual baseline values. -improving with IV fluids.  - Renal ultrasound with findings as noted above. 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  unchanged for last 2 years.. 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 Fu PCP.  -no renal mass on CT scan, enlargement of right collecting system and right ureter. Renal function improving. Might need follow up with urology.   UTI; Klebsiella on urine culture  Continue with ceftriaxone. Day 2  Elevated anion gap metabolic acidosis;  Improving with fluids. Insulin, lantus SSI.  Treating for infection.   Hyperlipidemia - Continue fenofibrate and statin.   Essential hypertension, benign - Hold Norvasc. PRN hydralazine   Uncontrolled type 2 diabetes mellitus (HCC) -hold lantus due to NPO, SSI.  - hemoglobin A1c.    Code Status: Full Code.  Family  Communication: patient  Disposition Plan: remain inpatient for treatment of SBO   Consultants:  surgery  Procedures:  [place];   Antibiotics:  Ceftriaxone.   HPI/Subjective: Abdominal pain is better.  No BM, has not pass gas.   Objective: Filed Vitals:   01/07/16 1949 01/08/16 0529  BP: 136/60 144/64  Pulse: 92 92  Temp: 98.2 F (36.8 C) 97.9 F (36.6 C)  Resp: 20 20    Intake/Output Summary (Last 24 hours) at 01/08/16 1351 Last data filed at 01/08/16 0400  Gross per 24 hour  Intake   1575 ml  Output      0 ml  Net   1575 ml   Filed Weights   01/06/16 2123  Weight: 126.4 kg (278 lb 10.6 oz)    Exam:   General:  NAD  Cardiovascular: S 1, S 2 RRR  Respiratory: CTA  Abdomen: BS, present, abdominal ventral hernia, tenderness, Abdominal wall deformity  Musculoskeletal: no edema, left LE with dry skin, mild redness.   Data Reviewed: Basic Metabolic Panel:  Recent Labs Lab 01/05/16 0855 01/06/16 1448 01/07/16 0355 01/08/16 0530  NA 134* 135 136 141  K 4.6 4.4 4.3 4.6  CL 96* 95* 101 110  CO2 21* 24 23 23   GLUCOSE 297* 232* 294* 163*  BUN 76* 79* 58* 35*  CREATININE 1.63* 1.37* 1.24* 0.84  CALCIUM 9.6 9.9 9.2 8.9   Liver Function Tests:  Recent Labs Lab 01/05/16 0855 01/06/16 1448  AST 16 15  ALT 13* 13*  ALKPHOS 52 54  BILITOT 0.9 0.8  PROT 7.6 8.1  ALBUMIN 3.2* 3.5    Recent Labs Lab 01/05/16 0855 01/06/16 1448  LIPASE 21 21   No results for input(s): AMMONIA in the last 168 hours. CBC:  Recent Labs Lab 01/05/16 0855 01/06/16 1448 01/08/16 0530  WBC 6.4 5.6 5.3  HGB 11.5* 11.7* 9.8*  HCT 35.7* 37.2 30.2*  MCV 91.3 91.9 92.1  PLT 336 327 247   Cardiac Enzymes:  Recent Labs Lab 01/06/16 1448  TROPONINI 0.05*   BNP (last 3 results) No results for input(s): BNP in the last 8760 hours.  ProBNP (last 3 results) No results for input(s): PROBNP in the last 8760 hours.  CBG:  Recent Labs Lab 01/07/16 2326  01/08/16 0147 01/08/16 0525 01/08/16 0736 01/08/16 1130  GLUCAP 152* 107* 149* 159* 177*    Recent Results (from the past 240 hour(s))  Urine culture     Status: None   Collection Time: 01/05/16  1:30 PM  Result Value Ref Range Status   Specimen Description URINE, CLEAN CATCH  Final   Special Requests none Normal  Final   Culture   Final    >=100,000 COLONIES/mL KLEBSIELLA PNEUMONIAE Performed at [facility]    Report Status 01/07/2016 FINAL  Final   Organism ID, Bacteria KLEBSIELLA PNEUMONIAE  Final      Susceptibility   Klebsiella pneumoniae - MIC*    AMPICILLIN >=32 RESISTANT Resistant     CEFAZOLIN <=4 SENSITIVE Sensitive     CEFTRIAXONE <=1 SENSITIVE Sensitive     CIPROFLOXACIN <=0.25 SENSITIVE Sensitive     GENTAMICIN <=1 SENSITIVE Sensitive     IMIPENEM <=0.25 SENSITIVE Sensitive     NITROFURANTOIN 64 INTERMEDIATE Intermediate     TRIMETH/SULFA <=20 SENSITIVE Sensitive     AMPICILLIN/SULBACTAM 8 SENSITIVE Sensitive     PIP/TAZO <=4 SENSITIVE Sensitive     * >=100,000 COLONIES/mL KLEBSIELLA PNEUMONIAE  Blood culture (routine x 2)     Status: None (Preliminary result)   Collection Time: 01/06/16  3:13 PM  Result Value Ref Range Status   Specimen Description BLOOD LEFT ANTECUBITAL  Final   Special Requests BOTTLES DRAWN AEROBIC AND ANAEROBIC  Final   Culture   Final    NO GROWTH 2 DAYS Performed at [facility]    Report Status PENDING  Incomplete  Blood culture (routine x 2)     Status: None (Preliminary result)   Collection Time: 01/06/16  3:14 PM  Result Value Ref Range Status   Specimen Description BLOOD RIGHT FOREARM  Final   Special Requests BOTTLES DRAWN AEROBIC ONLY  Final   Culture   Final    NO GROWTH 2 DAYS Performed at [facility]    Report Status PENDING  Incomplete     Studies: Ct Abdomen Pelvis Wo Contrast  01/06/2016  CLINICAL DATA:  Right-sided abdominal pain. Possible calculi on conventional radiographs.  History of necrotizing pancreatitis and diabetes. History pancreatic surgery. EXAM: CT ABDOMEN AND PELVIS WITHOUT CONTRAST TECHNIQUE: Multidetector CT imaging of the abdomen and pelvis was performed following the standard protocol without IV contrast. COMPARISON:  01/06/2016 ultrasounds and conventional radiographs FINDINGS: Body habitus reduces diagnostic sensitivity and specificity. Lower chest: Very dense mitral valve calcification observed. Mosaic attenuation at the left lung base with associated atelectasis. Mild cardiomegaly. Hepatobiliary: Clustered small gallstones measuring up to 6 mm in diameter are settled dependently in the gallbladder. None of these are directly observed to be in the common bile duct or common hepatic duct. Punctate calcification along the capsule of the inferior tip of the right hepatic lobe, likely due to remote  inflammation. Pancreas: Atrophic pancreatic body and pancreatic head. Poor definition of fat planes around the pancreatic tail. Aortoiliac atherosclerotic vascular disease. In the vicinity of the spleen, possibly from acute inflammation or chronic scarring. Spleen: Unremarkable Adrenals/Urinary Tract: Chronic appearing calcification in the upper portion of the left adrenal gland. Right adrenal mass measuring 3.1 by 2.1 cm has internal density of 14 Hounsfield units, technically nonspecific. Mild thinning of the renal cortex bilaterally with prominence of renal sinus adipose tissue. Prominent right collecting system and proximal right hydroureter. Suspected wall thickening in the right collecting system. Normal left ureter. No urinary tract calculi are identified. Tiny amount of gas in the urinary bladder. Query recent catheterization. Stomach/Bowel: Right periumbilical hernia containing a loop of small bowel with dilated bowel up to about 5.5 cm proximal to this loop, and with the distal small bowel not dilated, shown on image 44 series 3 and image 81 series 6. There also left  para umbilical abdominal herniations, 1 containing transverse colon and the other containing a dilated loop of small bowel, but not acting as a focus for obstruction. Vascular/Lymphatic: Aortoiliac atherosclerotic vascular disease. No pathologic adenopathy observed. Reproductive: Calcification along the anterior uterine body measuring 1.6 cm, probably from a subserosal fibroid. Adnexa unremarkable. Other: Suspected prior laparotomy with some separation of the pannus into left and right components. Both demonstrate abnormal subcutaneous edema and skin thickening which is not fully included on today' s exam because of body habitus. On the left side there is a greater degree of stranding in the pannus raising suspicion for panniculitis. Musculoskeletal: Bilateral paramidline hernias, with a right ventral hernia causing obstruction of a loop of small bowel, and several left ventral hernias containing transverse colon and small bowel. There is potentially some fluid in the right hernia. Lumbar spondylosis and degenerative disc disease with grade 1 anterolisthesis at L4-5, loss of disc height at L5-S1, and potential central narrowing of the thecal sac at L4-5 due to degenerative disc disease. There notable lucencies along the acetabular roof and posterior right acetabulum, possibly geodes. Given the hazy surrounding bone density in this vicinity, fibrous dysplasia is not excluded. IMPRESSION: 1. Small bowel obstruction due to a right paracentral hernia containing a loop of small bowel with dilated the efferent component and nondilated efferent component. There is probably some fluid in this hernia sac and strangulation is not excluded. 2. There several left paramedian hernia sacs, 1 containing transverse colon and the lower hernia containing a dilated loop of small bowel. This left-sided hernia is not thought to be the site of obstruction. 3. Bilateral panniculitis left greater than right. 4. Fullness of the right  collecting system and right proximal ureter possibly with slight wall thickening in the right proximal collecting system. Inflammation in this vicinity is not excluded but I do not see urinary tract calculi 5. Clustered tiny gallstones observed. 6. Poor definition of fat planes along the pancreatic tail, quite likely from chronic scarring although active pancreatitis is not completely excluded. Atrophic pancreatic body and head likely from remote pancreatic injury. 7. Prominent dense calcification of the mitral valve with mild cardiomegaly. 8. Atelectasis in the left lower lobe. 9. Calcification situated between the anterior uterine body in urinary bladder probably a subserosal calcified fibroid. 10. Lower lumbar spondylosis and degenerative disc disease with potential mild impingement at L4-5. 11. Lucencies along the right acetabular roof and posterior acetabulum with surrounding ground-glass matrix bone, query fibrous dysplasia and/or geodes with surrounding reactive findings. Electronically Signed   By: Zollie Beckers  Ova FreshwaterLiebkemann M.D.   On: 01/06/2016 19:20   [place] Abdomen Complete  01/06/2016  CLINICAL DATA:  Five day history of right-sided pain EXAM: ABDOMEN ULTRASOUND COMPLETE COMPARISON:  None. FINDINGS: Gallbladder: Within the gallbladder, there are multiple small echogenic foci which move and shadow consistent with cholelithiasis. Largest individual gallstone measures 5 mm in length. There is no gallbladder wall thickening or pericholecystic fluid. No sonographic Murphy sign noted by sonographer. Common bile duct: Diameter: 6 mm. No intrahepatic, common hepatic, or common bile duct dilatation. Liver: No focal lesion identified. Within normal limits in parenchymal echogenicity. IVC: No abnormality visualized. Pancreas: Visualized portion unremarkable. Portions of pancreas obscured by gas. Spleen: Size and appearance within normal limits. Right Kidney: Length: 11.7 cm. Echogenicity within normal limits. No mass or  visualized. There is mild fullness of the right renal pelvis and upper pole collecting system. Obstructing focus not seen. Left Kidney: Length: 13.3 cm. Echogenicity within normal limits. No mass or hydronephrosis visualized. Abdominal aorta: No aneurysm visualized. Other findings: No demonstrable ascites. There is a hypoechoic lesion just superior to the right kidney, likely an adrenal lesion on the right. This lesion measures 2.5 x 1.3 x 2.5 cm. IMPRESSION: Cholelithiasis. No gallbladder wall thickening or pericholecystic fluid. Fullness of the right renal pelvis and upper pole collecting system. Obstructing lesion not appreciable by ultrasound. This finding may warrant noncontrast enhanced CT to assess for potential ureteral calculus. Probable right renal mass measuring 2.5 x 1.3 x 2.5 cm. CT could also be helpful to further assess this area. Study otherwise unremarkable. Note that portions of pancreas not seen due to overlying gas. Electronically Signed   By: Bretta BangWilliam  Woodruff III M.D.   On: 01/06/2016 18:02   Dg Abd 2 Views  01/08/2016  CLINICAL DATA:  Abdominal pain.  Small-bowel obstruction. EXAM: ABDOMEN - 2 VIEW COMPARISON:  Abdominal radiographs 01/07/2016. CT of the abdomen and pelvis 01/06/2016. FINDINGS: Dilation of small bowel is slightly improved. There is persistent distention of both large and small bowel in the left lower quadrant. Degenerative changes are present in the lower lumbar spine. Atherosclerotic calcifications are present. There is no free air. IMPRESSION: 1. Slight improvement and small bowel distension with persistent left-sided small bowel obstruction. Electronically Signed   By: Marin Robertshristopher  Mattern M.D.   On: 01/08/2016 08:56   Dg Abd 2 Views  01/07/2016  CLINICAL DATA:  Small-bowel obstruction EXAM: ABDOMEN - 2 VIEW COMPARISON:  01/06/2016 FINDINGS: There are several loops of mid to left abdominal small bowel which are distended up to 5.4 cm in diameter. There is air in the  stomach and there are small bowel air-fluid levels. There is minimal air into the colon. IMPRESSION: Findings again consistent with small-bowel obstruction Electronically Signed   By: Esperanza Heiraymond  Rubner M.D.   On: 01/07/2016 12:17   Dg Abd Acute W/chest  01/06/2016  CLINICAL DATA:  Right upper quadrant abdominal pain with vomiting. History of hypertension and diabetes. EXAM: DG ABDOMEN ACUTE W/ 1V CHEST COMPARISON:  None. FINDINGS: Examination is limited by body habitus. The heart size is at the upper limits of normal. There is patchy airspace disease in the left lower lobe. The right lung is clear. No pneumothorax or significant pleural effusion identified. The bowel gas pattern is normal. There is no free intraperitoneal air. 12 mm calcification overlies the lower pole of the right kidney. Left pelvic calcifications likely vascular. Mild degenerative changes are present within the lumbar spine and at the right hip. IMPRESSION: 1. Patchy left lower  lobe atelectasis or infiltrate. 2. No acute abdominal findings demonstrated. 3. Right mid and pelvic calcifications as described. The former could reflect a renal calculus or gallstone. Electronically Signed   By: Carey Bullocks M.D.   On: 01/06/2016 14:56    Scheduled Meds: . cefTRIAXone (ROCEPHIN)  IV  1 g Intravenous Q24H  . darifenacin  15 mg Oral Daily  . enoxaparin (LOVENOX) injection  40 mg Subcutaneous Q24H  . fluticasone  1 spray Each Nare Daily  . insulin aspart  0-20 Units Subcutaneous 6 times per day  . mirabegron ER  50 mg Oral Daily  . nystatin  5 mL Oral QID  . omega-3 acid ethyl esters  1 g Oral Daily  . pantoprazole (PROTONIX) IV  40 mg Intravenous Q24H  . sodium chloride  3 mL Intravenous Q12H   Continuous Infusions: . sodium chloride 75 mL/hr at 01/08/16 0941    Principal Problem:   SBO (small bowel obstruction) (HCC) Active Problems:   Hyperlipidemia   Essential hypertension, benign   Persistent vomiting   Nausea and  vomiting   Uncontrolled type 2 diabetes mellitus (HCC)   Acute kidney injury (HCC)   Morbid obesity (HCC)   Thrush   Abdominal pain   Right renal mass    Time spent: 35 minutes.     Hartley Barefoot A  [facility] Pager [phone]. If 7PM-7AM, please contact night-coverage at [email or web address], password [facility] 01/08/2016, 1:51 PM  LOS: 2 days

## 2016-01-08 NOTE — Progress Notes (Signed)
Patient ID: Valerie [name], female   DOB: 11-13-1946, 70 y.o.   MRN: [id number]     [facility]      [address] [place], [place] [address]    Phone: [phone] FAX: [phone]     Subjective: No n/v.  Minimal pain. No flatus or BM.  AXR shows slight improvement.   Objective:  Vital signs:  Filed Vitals:   01/07/16 0446 01/07/16 1433 01/07/16 1949 01/08/16 0529  BP: 128/48 141/66 136/60 144/64  Pulse: 103 92 92 92  Temp: 98 F (36.7 C) 97.7 F (36.5 C) 98.2 F (36.8 C) 97.9 F (36.6 C)  TempSrc: Oral Oral Oral Oral  Resp: _0 Height:      Weight:      SpO2: 96% 96% 98% 97%    Last BM Date: 01/05/16 (per patient. )  Intake/Output   Yesterday:  01/10 0701 - 01/11 0700 In: 1575 [I.V.:1575] Out: -  This shift:    Physical Exam: General: Pt awake/alert/oriented x4 in no acute distress  Abdomen: Soft. Nondistended. 2 large hernias, soft, unable to reduce fully, no erythema.   Problem List:   Principal Problem:   SBO (small bowel obstruction) (HCC) Active Problems:   Hyperlipidemia   Essential hypertension, benign   Persistent vomiting   Nausea and vomiting   Uncontrolled type 2 diabetes mellitus (HCC)   Acute kidney injury ([place])   Morbid obesity ([place])   Thrush   Abdominal pain   Right renal mass    Results:   Labs: Results for orders placed or performed during the hospital encounter of 01/06/16 (from the past 48 hour(s))  CBC     Status: Abnormal   Collection Time: 01/06/16  2:48 PM  Result Value Ref Range   WBC 5.6 4.0 - 10.5 K/uL   RBC 4.05 3.87 - 5.11 MIL/uL   Hemoglobin 11.7 (L) 12.0 - 15.0 g/dL   HCT 37.2 36.0 - 46.0 %   MCV 91.9 78.0 - 100.0 fL   MCH 28.9 26.0 - 34.0 pg   MCHC 31.5 30.0 - 36.0 g/dL   RDW 15.1 11.5 - 15.5 %   Platelets 327 150 - 400 K/uL  Comprehensive metabolic panel     Status: Abnormal   Collection Time: 01/06/16  2:48 PM  Result Value Ref Range    Sodium 135 135 - 145 mmol/L   Potassium 4.4 3.5 - 5.1 mmol/L   Chloride 95 (L) 101 - 111 mmol/L   CO2 24 22 - 32 mmol/L   Glucose, Bld 232 (H) 65 - 99 mg/dL   BUN 79 (H) 6 - 20 mg/dL   Creatinine, Ser 1.37 (H) 0.44 - 1.00 mg/dL   Calcium 9.9 8.9 - 10.3 mg/dL   Total Protein 8.1 6.5 - 8.1 g/dL   Albumin 3.5 3.5 - 5.0 g/dL   AST 15 15 - 41 U/L   ALT 13 (L) 14 - 54 U/L   Alkaline Phosphatase 54 38 - 126 U/L   Total Bilirubin 0.8 0.3 - 1.2 mg/dL   GFR calc non Af Amer 38 (L) >60 mL/min   GFR calc Af Amer 44 (L) >60 mL/min    Comment: (NOTE) The eGFR has been calculated using the CKD EPI equation. This calculation has not been validated in all clinical situations. eGFR's persistently <60 mL/min signify possible Chronic Kidney Disease.    Anion gap 16 (H) 5 - 15  Lipase, blood  Status: None   Collection Time: 01/06/16  2:48 PM  Result Value Ref Range   Lipase 21 11 - 51 U/L  Troponin I     Status: Abnormal   Collection Time: 01/06/16  2:48 PM  Result Value Ref Range   Troponin I 0.05 (H) <0.031 ng/mL    Comment:        PERSISTENTLY INCREASED TROPONIN VALUES IN THE RANGE OF 0.04-0.49 ng/mL CAN BE SEEN IN:       -UNSTABLE ANGINA       -CONGESTIVE HEART FAILURE       -MYOCARDITIS       -CHEST TRAUMA       -ARRYHTHMIAS       -LATE PRESENTING MYOCARDIAL INFARCTION       -COPD   CLINICAL FOLLOW-UP RECOMMENDED.   Hemoglobin A1c     Status: Abnormal   Collection Time: 01/06/16  2:48 PM  Result Value Ref Range   Hgb A1c MFr Bld 6.4 (H) 4.8 - 5.6 %    Comment: (NOTE)         Pre-diabetes: 5.7 - 6.4         Diabetes: >6.4         Glycemic control for adults with diabetes: <7.0    Mean Plasma Glucose 137 mg/dL    Comment: (NOTE) Performed At: [facility] [name], [place] [id number] Lindon Romp MD [id number]   Blood culture (routine x 2)     Status: None (Preliminary result)   Collection Time: 01/06/16  3:13 PM  Result Value Ref Range    Specimen Description BLOOD LEFT ANTECUBITAL    Special Requests BOTTLES DRAWN AEROBIC AND ANAEROBIC 5ML    Culture      NO GROWTH < 24 HOURS Performed at [facility]    Report Status PENDING   Blood culture (routine x 2)     Status: None (Preliminary result)   Collection Time: 01/06/16  3:14 PM  Result Value Ref Range   Specimen Description BLOOD RIGHT FOREARM    Special Requests BOTTLES DRAWN AEROBIC ONLY 6ML    Culture      NO GROWTH < 24 HOURS Performed at [facility]    Report Status PENDING   I-Stat CG4 Lactic Acid, ED     Status: None   Collection Time: 01/06/16  3:22 PM  Result Value Ref Range   Lactic Acid, Venous 1.42 0.5 - 2.0 mmol/L  Urinalysis, Routine w reflex microscopic (not at [facility])     Status: Abnormal   Collection Time: 01/06/16  3:33 PM  Result Value Ref Range   Color, Urine YELLOW YELLOW   APPearance CLOUDY (A) CLEAR   Specific Gravity, Urine 1.026 1.005 - 1.030   pH 5.5 5.0 - 8.0   Glucose, UA 100 (A) NEGATIVE mg/dL   Hgb urine dipstick LARGE (A) NEGATIVE   Bilirubin Urine SMALL (A) NEGATIVE   Ketones, ur NEGATIVE NEGATIVE mg/dL   Protein, ur 30 (A) NEGATIVE mg/dL   Nitrite NEGATIVE NEGATIVE   Leukocytes, UA LARGE (A) NEGATIVE  Urine microscopic-add on     Status: Abnormal   Collection Time: 01/06/16  3:33 PM  Result Value Ref Range   Squamous Epithelial / LPF 6-30 (A) NONE SEEN   WBC, UA TOO NUMEROUS TO COUNT 0 - 5 WBC/hpf   RBC / HPF TOO NUMEROUS TO COUNT 0 - 5 RBC/hpf   Bacteria, UA FEW (A) NONE SEEN  Glucose, capillary  Status: Abnormal   Collection Time: 01/06/16  9:56 PM  Result Value Ref Range   Glucose-Capillary 298 (H) 65 - 99 mg/dL  Lactic acid, plasma     Status: None   Collection Time: 01/06/16 10:14 PM  Result Value Ref Range   Lactic Acid, Venous 1.4 0.5 - 2.0 mmol/L  Basic metabolic panel     Status: Abnormal   Collection Time: 01/07/16  3:55 AM  Result Value Ref Range   Sodium 136 135 - 145 mmol/L    Potassium 4.3 3.5 - 5.1 mmol/L   Chloride 101 101 - 111 mmol/L   CO2 23 22 - 32 mmol/L   Glucose, Bld 294 (H) 65 - 99 mg/dL   BUN 58 (H) 6 - 20 mg/dL   Creatinine, Ser 1.24 (H) 0.44 - 1.00 mg/dL   Calcium 9.2 8.9 - 10.3 mg/dL   GFR calc non Af Amer 43 (L) >60 mL/min   GFR calc Af Amer 49 (L) >60 mL/min    Comment: (NOTE) The eGFR has been calculated using the CKD EPI equation. This calculation has not been validated in all clinical situations. eGFR's persistently <60 mL/min signify possible Chronic Kidney Disease.    Anion gap 12 5 - 15  Glucose, capillary     Status: Abnormal   Collection Time: 01/07/16  4:51 AM  Result Value Ref Range   Glucose-Capillary 248 (H) 65 - 99 mg/dL  Glucose, capillary     Status: Abnormal   Collection Time: 01/07/16  7:32 AM  Result Value Ref Range   Glucose-Capillary 237 (H) 65 - 99 mg/dL  Glucose, capillary     Status: Abnormal   Collection Time: 01/07/16 11:51 AM  Result Value Ref Range   Glucose-Capillary 179 (H) 65 - 99 mg/dL  Glucose, capillary     Status: Abnormal   Collection Time: 01/07/16  4:57 PM  Result Value Ref Range   Glucose-Capillary 199 (H) 65 - 99 mg/dL  Glucose, capillary     Status: Abnormal   Collection Time: 01/07/16  7:44 PM  Result Value Ref Range   Glucose-Capillary 203 (H) 65 - 99 mg/dL  Glucose, capillary     Status: Abnormal   Collection Time: 01/07/16 11:26 PM  Result Value Ref Range   Glucose-Capillary 152 (H) 65 - 99 mg/dL   Comment 1 Notify RN    Comment 2 Document in Chart   Glucose, capillary     Status: Abnormal   Collection Time: 01/08/16  1:47 AM  Result Value Ref Range   Glucose-Capillary 107 (H) 65 - 99 mg/dL  Glucose, capillary     Status: Abnormal   Collection Time: 01/08/16  5:25 AM  Result Value Ref Range   Glucose-Capillary 149 (H) 65 - 99 mg/dL  CBC     Status: Abnormal   Collection Time: 01/08/16  5:30 AM  Result Value Ref Range   WBC 5.3 4.0 - 10.5 K/uL   RBC 3.28 (L) 3.87 - 5.11 MIL/uL    Hemoglobin 9.8 (L) 12.0 - 15.0 g/dL   HCT 30.2 (L) 36.0 - 46.0 %   MCV 92.1 78.0 - 100.0 fL   MCH 29.9 26.0 - 34.0 pg   MCHC 32.5 30.0 - 36.0 g/dL   RDW 15.1 11.5 - 15.5 %   Platelets 247 150 - 400 K/uL  Basic metabolic panel     Status: Abnormal   Collection Time: 01/08/16  5:30 AM  Result Value Ref Range   Sodium 141 135 -  145 mmol/L   Potassium 4.6 3.5 - 5.1 mmol/L   Chloride 110 101 - 111 mmol/L   CO2 23 22 - 32 mmol/L   Glucose, Bld 163 (H) 65 - 99 mg/dL   BUN 35 (H) 6 - 20 mg/dL   Creatinine, Ser 0.84 0.44 - 1.00 mg/dL   Calcium 8.9 8.9 - 10.3 mg/dL   GFR calc non Af Amer >60 >60 mL/min   GFR calc Af Amer >60 >60 mL/min    Comment: (NOTE) The eGFR has been calculated using the CKD EPI equation. This calculation has not been validated in all clinical situations. eGFR's persistently <60 mL/min signify possible Chronic Kidney Disease.    Anion gap 8 5 - 15  Glucose, capillary     Status: Abnormal   Collection Time: 01/08/16  7:36 AM  Result Value Ref Range   Glucose-Capillary 159 (H) 65 - 99 mg/dL    Imaging / Studies: Ct Abdomen Pelvis Wo Contrast  01/06/2016  CLINICAL DATA:  Right-sided abdominal pain. Possible calculi on conventional radiographs. History of necrotizing pancreatitis and diabetes. History pancreatic surgery. EXAM: CT ABDOMEN AND PELVIS WITHOUT CONTRAST TECHNIQUE: Multidetector CT imaging of the abdomen and pelvis was performed following the standard protocol without IV contrast. COMPARISON:  01/06/2016 ultrasounds and conventional radiographs FINDINGS: Body habitus reduces diagnostic sensitivity and specificity. Lower chest: Very dense mitral valve calcification observed. Mosaic attenuation at the left lung base with associated atelectasis. Mild cardiomegaly. Hepatobiliary: Clustered small gallstones measuring up to 6 mm in diameter are settled dependently in the gallbladder. None of these are directly observed to be in the common bile duct or common hepatic  duct. Punctate calcification along the capsule of the inferior tip of the right hepatic lobe, likely due to remote inflammation. Pancreas: Atrophic pancreatic body and pancreatic head. Poor definition of fat planes around the pancreatic tail. Aortoiliac atherosclerotic vascular disease. In the vicinity of the spleen, possibly from acute inflammation or chronic scarring. Spleen: Unremarkable Adrenals/Urinary Tract: Chronic appearing calcification in the upper portion of the left adrenal gland. Right adrenal mass measuring 3.1 by 2.1 cm has internal density of 14 Hounsfield units, technically nonspecific. Mild thinning of the renal cortex bilaterally with prominence of renal sinus adipose tissue. Prominent right collecting system and proximal right hydroureter. Suspected wall thickening in the right collecting system. Normal left ureter. No urinary tract calculi are identified. Tiny amount of gas in the urinary bladder. Query recent catheterization. Stomach/Bowel: Right periumbilical hernia containing a loop of small bowel with dilated bowel up to about 5.5 cm proximal to this loop, and with the distal small bowel not dilated, shown on image 44 series 3 and image 81 series 6. There also left para umbilical abdominal herniations, 1 containing transverse colon and the other containing a dilated loop of small bowel, but not acting as a focus for obstruction. Vascular/Lymphatic: Aortoiliac atherosclerotic vascular disease. No pathologic adenopathy observed. Reproductive: Calcification along the anterior uterine body measuring 1.6 cm, probably from a subserosal fibroid. Adnexa unremarkable. Other: Suspected prior laparotomy with some separation of the pannus into left and right components. Both demonstrate abnormal subcutaneous edema and skin thickening which is not fully included on today' s exam because of body habitus. On the left side there is a greater degree of stranding in the pannus raising suspicion for  panniculitis. Musculoskeletal: Bilateral paramidline hernias, with a right ventral hernia causing obstruction of a loop of small bowel, and several left ventral hernias containing transverse colon and small bowel. There is  potentially some fluid in the right hernia. Lumbar spondylosis and degenerative disc disease with grade 1 anterolisthesis at L4-5, loss of disc height at L5-S1, and potential central narrowing of the thecal sac at L4-5 due to degenerative disc disease. There notable lucencies along the acetabular roof and posterior right acetabulum, possibly geodes. Given the hazy surrounding bone density in this vicinity, fibrous dysplasia is not excluded. IMPRESSION: 1. Small bowel obstruction due to a right paracentral hernia containing a loop of small bowel with dilated the efferent component and nondilated efferent component. There is probably some fluid in this hernia sac and strangulation is not excluded. 2. There several left paramedian hernia sacs, 1 containing transverse colon and the lower hernia containing a dilated loop of small bowel. This left-sided hernia is not thought to be the site of obstruction. 3. Bilateral panniculitis left greater than right. 4. Fullness of the right collecting system and right proximal ureter possibly with slight wall thickening in the right proximal collecting system. Inflammation in this vicinity is not excluded but I do not see urinary tract calculi 5. Clustered tiny gallstones observed. 6. Poor definition of fat planes along the pancreatic tail, quite likely from chronic scarring although active pancreatitis is not completely excluded. Atrophic pancreatic body and head likely from remote pancreatic injury. 7. Prominent dense calcification of the mitral valve with mild cardiomegaly. 8. Atelectasis in the left lower lobe. 9. Calcification situated between the anterior uterine body in urinary bladder probably a subserosal calcified fibroid. 10. Lower lumbar spondylosis  and degenerative disc disease with potential mild impingement at L4-5. 11. Lucencies along the right acetabular roof and posterior acetabulum with surrounding ground-glass matrix bone, query fibrous dysplasia and/or geodes with surrounding reactive findings. Electronically Signed   By: Van Clines M.D.   On: 01/06/2016 19:20   US Abdomen Complete  01/06/2016  CLINICAL DATA:  Five day history of right-sided pain EXAM: ABDOMEN ULTRASOUND COMPLETE COMPARISON:  None. FINDINGS: Gallbladder: Within the gallbladder, there are multiple small echogenic foci which move and shadow consistent with cholelithiasis. Largest individual gallstone measures 5 mm in length. There is no gallbladder wall thickening or pericholecystic fluid. No sonographic Murphy sign noted by sonographer. Common bile duct: Diameter: 6 mm. No intrahepatic, common hepatic, or common bile duct dilatation. Liver: No focal lesion identified. Within normal limits in parenchymal echogenicity. IVC: No abnormality visualized. Pancreas: Visualized portion unremarkable. Portions of pancreas obscured by gas. Spleen: Size and appearance within normal limits. Right Kidney: Length: 11.7 cm. Echogenicity within normal limits. No mass or visualized. There is mild fullness of the right renal pelvis and upper pole collecting system. Obstructing focus not seen. Left Kidney: Length: 13.3 cm. Echogenicity within normal limits. No mass or hydronephrosis visualized. Abdominal aorta: No aneurysm visualized. Other findings: No demonstrable ascites. There is a hypoechoic lesion just superior to the right kidney, likely an adrenal lesion on the right. This lesion measures 2.5 x 1.3 x 2.5 cm. IMPRESSION: Cholelithiasis. No gallbladder wall thickening or pericholecystic fluid. Fullness of the right renal pelvis and upper pole collecting system. Obstructing lesion not appreciable by ultrasound. This finding may warrant noncontrast enhanced CT to assess for potential ureteral  calculus. Probable right renal mass measuring 2.5 x 1.3 x 2.5 cm. CT could also be helpful to further assess this area. Study otherwise unremarkable. Note that portions of pancreas not seen due to overlying gas. Electronically Signed   By: Lowella Grip III M.D.   On: 01/06/2016 18:02   Dg Abd 2 Views  01/08/2016  CLINICAL DATA:  Abdominal pain.  Small-bowel obstruction. EXAM: ABDOMEN - 2 VIEW COMPARISON:  Abdominal radiographs 01/07/2016. CT of the abdomen and pelvis 01/06/2016. FINDINGS: Dilation of small bowel is slightly improved. There is persistent distention of both large and small bowel in the left lower quadrant. Degenerative changes are present in the lower lumbar spine. Atherosclerotic calcifications are present. There is no free air. IMPRESSION: 1. Slight improvement and small bowel distension with persistent left-sided small bowel obstruction. Electronically Signed   By: San Morelle M.D.   On: 01/08/2016 08:56   Dg Abd 2 Views  01/07/2016  CLINICAL DATA:  Small-bowel obstruction EXAM: ABDOMEN - 2 VIEW COMPARISON:  01/06/2016 FINDINGS: There are several loops of mid to left abdominal small bowel which are distended up to 5.4 cm in diameter. There is air in the stomach and there are small bowel air-fluid levels. There is minimal air into the colon. IMPRESSION: Findings again consistent with small-bowel obstruction Electronically Signed   By: Skipper Cliche M.D.   On: 01/07/2016 12:17   Dg Abd Acute W/chest  01/06/2016  CLINICAL DATA:  Right upper quadrant abdominal pain with vomiting. History of hypertension and diabetes. EXAM: DG ABDOMEN ACUTE W/ 1V CHEST COMPARISON:  None. FINDINGS: Examination is limited by body habitus. The heart size is at the upper limits of normal. There is patchy airspace disease in the left lower lobe. The right lung is clear. No pneumothorax or significant pleural effusion identified. The bowel gas pattern is normal. There is no free intraperitoneal air. 12  mm calcification overlies the lower pole of the right kidney. Left pelvic calcifications likely vascular. Mild degenerative changes are present within the lumbar spine and at the right hip. IMPRESSION: 1. Patchy left lower lobe atelectasis or infiltrate. 2. No acute abdominal findings demonstrated. 3. Right mid and pelvic calcifications as described. The former could reflect a renal calculus or gallstone. Electronically Signed   By: Richardean Sale M.D.   On: 01/06/2016 14:56    Medications / Allergies:  Scheduled Meds: . cefTRIAXone (ROCEPHIN)  IV  1 g Intravenous Q24H  . darifenacin  15 mg Oral Daily  . enoxaparin (LOVENOX) injection  40 mg Subcutaneous Q24H  . fluticasone  1 spray Each Nare Daily  . insulin aspart  0-20 Units Subcutaneous 6 times per day  . mirabegron ER  50 mg Oral Daily  . nystatin  5 mL Oral QID  . omega-3 acid ethyl esters  1 g Oral Daily  . pantoprazole (PROTONIX) IV  40 mg Intravenous Q24H  . sodium chloride  3 mL Intravenous Q12H   Continuous Infusions: . sodium chloride 75 mL/hr at 01/06/16 2203   PRN Meds:.acetaminophen **OR** acetaminophen, hydrALAZINE, morphine injection, ondansetron **OR** ondansetron (ZOFRAN) IV, oxyCODONE  Antibiotics: Anti-infectives    Start     Dose/Rate Route Frequency Ordered Stop   01/07/16 1600  cefTRIAXone (ROCEPHIN) 1 g in dextrose 5 % 50 mL IVPB     1 g 100 mL/hr over 30 Minutes Intravenous Every 24 hours 01/06/16 2125     01/06/16 1645  cefTRIAXone (ROCEPHIN) 1 g in dextrose 5 % 50 mL IVPB     1 g 100 mL/hr over 30 Minutes Intravenous  Once 01/06/16 1638 01/06/16 1733   01/06/16 1645  azithromycin (ZITHROMAX) 500 mg in dextrose 5 % 250 mL IVPB     500 mg 250 mL/hr over 60 Minutes Intravenous  Once 01/06/16 1638 01/06/16 1946  Assessment/Plan 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 Dalbert Batman SBO secondary to ventral incisional hernia-AXR somewhat improved, no flatus, no n/v.  Will see how she  does with clears. Hopefully can avoid surgery, but I'm not optimistic.   VTE prophylaxis-SCD/lovenox FEN-clears    Erby Pian, [facility] Surgery Pager [phone]([id number])   01/08/2016 9:02 AM

## 2016-01-08 NOTE — Telephone Encounter (Signed)
Post ED Visit - Positive Culture Follow-up  Culture report reviewed by antimicrobial stewardship pharmacist:  []  Enzo BiNathan Batchelder, Pharm.D. []  Celedonio MiyamotoJeremy Frens, Pharm.D., BCPS []  Garvin FilaMike Maccia, Pharm.D. []  Georgina PillionElizabeth Martin, Pharm.D., BCPS []  RheemsMinh Pham, [address]Ph[address]D., BCPS, AAHIVP []  Estella HuskMichelle Turner, Pharm.D., BCPS, AAHIVP []  Tennis Mustassie Stewart, [address]Ph[address]D. []  Sherle Poeob Vincent, [address]Jacinto Reap. X  Aubrey Jones, Pharm.D.  Positive urine culture, >/= 100,000 colonies -> Klebsiella Pneumoniae Treated with Ciprofloxacin, organism sensitive to the same and no further patient follow-up is required at this time.  Arvid RightClark, Teejay Meader Dorn 01/08/2016, 10:37 AM

## 2016-01-08 NOTE — Consult Note (Signed)
WOC wound consult note Reason for Consult: Chronic dry flaking skin on left medial malleolus Wound type:venous insufficiency Pressure Ulcer POA: No Measurement:Area measures 6cm x 5cm with 0.1cm depth Wound ZOX:WRUEAVWUbed:pinpoint open areas beneath dry flaking skin Drainage (amount, consistency, odor) None Periwound:intact Dressing procedure/placement/frequency: I will provide patient with Eucerin cream for twice daily application following bathing. WOC nursing team will not follow, but will remain available to this patient, the nursing and medical teams.  Please re-consult if needed. Thanks, Ladona MowLaurie Carsyn Taubman, MSN, RN, GNP, Hans EdenCWOCN, CWON-AP, FAAN  Pager# [phone]

## 2016-01-09 LAB — CBC
HCT: 31.5 % — ABNORMAL LOW (ref 36.0–46.0)
Hemoglobin: 9.8 g/dL — ABNORMAL LOW (ref 12.0–15.0)
MCH: 29 pg (ref 26.0–34.0)
MCHC: 31.1 g/dL (ref 30.0–36.0)
MCV: 93.2 fL (ref 78.0–100.0)
PLATELETS: 266 10*3/uL (ref 150–400)
RBC: 3.38 MIL/uL — AB (ref 3.87–5.11)
RDW: 15.2 % (ref 11.5–15.5)
WBC: 6 10*3/uL (ref 4.0–10.5)

## 2016-01-09 LAB — GLUCOSE, CAPILLARY
GLUCOSE-CAPILLARY: 139 mg/dL — AB (ref 65–99)
GLUCOSE-CAPILLARY: 187 mg/dL — AB (ref 65–99)
GLUCOSE-CAPILLARY: 219 mg/dL — AB (ref 65–99)
GLUCOSE-CAPILLARY: 307 mg/dL — AB (ref 65–99)
Glucose-Capillary: 162 mg/dL — ABNORMAL HIGH (ref 65–99)
Glucose-Capillary: 148 mg/dL — ABNORMAL HIGH (ref 65–99)
Glucose-Capillary: 219 mg/dL — ABNORMAL HIGH (ref 65–99)

## 2016-01-09 LAB — BASIC METABOLIC PANEL
ANION GAP: 10 (ref 5–15)
BUN: 19 mg/dL (ref 6–20)
CO2: 23 mmol/L (ref 22–32)
Calcium: 9.1 mg/dL (ref 8.9–10.3)
Chloride: 107 mmol/L (ref 101–111)
Creatinine, Ser: 0.74 mg/dL (ref 0.44–1.00)
GFR calc Af Amer: >60 mL/min (ref >60)
GLUCOSE: 151 mg/dL — AB (ref 65–99)
POTASSIUM: 3.9 mmol/L (ref 3.5–5.1)
SODIUM: 140 mmol/L (ref 135–145)

## 2016-01-09 NOTE — Care Management Note (Signed)
Case Management Note  Patient Details  Name: Vanessa DurhamBeverly Eline MRN: [id number] Date of Birth: September 20, 1946  Subjective/Objective: 70 y/o f admitted w/SBO. Sx-advancing diet-fulls, had a BM. From home.                   Action/Plan:d/c plan home.   Expected Discharge Date:   (unknown)               Expected Discharge Plan:  Home/Self Care  In-House Referral:     Discharge planning Services  CM Consult  Post Acute Care Choice:    Choice offered to:     DME Arranged:    DME Agency:     HH Arranged:    HH Agency:     Status of Service:  In process, will continue to follow  Medicare Important Message Given:  Yes Date Medicare IM Given:    Medicare IM give by:    Date Additional Medicare IM Given:    Additional Medicare Important Message give by:     If discussed at Long Length of Stay Meetings, dates discussed:    Additional Comments:  Lanier ClamMahabir, Talon Regala, RN 01/09/2016, 11:43 AM

## 2016-01-09 NOTE — Progress Notes (Signed)
Physical Therapy Treatment Patient Details Name: Vanessa DurhamBeverly Mcclinton MRN: [id number] DOB: 07-13-1946 Today's Date: 01/09/2016    History of Present Illness Pt is a 70 yo WF with 4 day history of abdominal pain, poor appetite, nausea, and emesis. Denies fever or chills. Admitted to medical service with abdominal pain and suspected kidney stone. 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    PT Comments    Pt ambulated short distance in hallway.  Pt fatigues quickly and reports SOB however this appears to be close to her baseline.  Follow Up Recommendations  Supervision - Intermittent     Equipment Recommendations  None recommended by PT    Recommendations for Other Services       Precautions / Restrictions Precautions Precautions: None    Mobility  Bed Mobility Overal bed mobility: Needs Assistance Bed Mobility: Supine to Sit;Sit to Supine     Supine to sit: Modified independent (Device/Increase time);HOB elevated Sit to supine: Mod assist   General bed mobility comments: required assist for LEs onto bed  Transfers Overall transfer level: Needs assistance Equipment used: Rolling walker (2 wheeled) Transfers: Sit to/from Stand Sit to Stand: Supervision         General transfer comment: verbal cues for hand placement  Ambulation/Gait Ambulation/Gait assistance: Min guard Ambulation Distance (Feet): 60 Feet Assistive device: Rolling walker (2 wheeled) Gait Pattern/deviations: Step-through pattern;Decreased stride length;Trunk flexed     General Gait Details: DOE 2/4, reports at her baseline with SOB, required short standing rest break after 30 feet, fatigues quickly   Stairs            Wheelchair Mobility    Modified Rankin (Stroke Patients Only)       Balance                                     Cognition Arousal/Alertness: Awake/alert Behavior During Therapy: WFL for tasks assessed/performed Overall Cognitive Status: Within Functional Limits for tasks assessed                      Exercises      General Comments        Pertinent Vitals/Pain Pain Assessment: No/denies pain    Home Living                      Prior Function            PT Goals (current goals can now be found in the care plan section) Progress towards PT goals: Progressing toward goals    Frequency  Min 3X/week    PT Plan Current plan remains appropriate    Co-evaluation             End of Session Equipment Utilized During Treatment: Gait belt Activity Tolerance: Patient limited by fatigue Patient left: with call bell/phone within reach;in bed;with family/visitor present     Time: [id number] PT Time Calculation (min) (ACUTE ONLY): 12 min  Charges:  $Gait Training: 8-22 mins                    G Codes:      Leimomi Zervas,KATHrine E 01/09/2016, 12:55 PM Zenovia JarredKati Krystiana Fornes, PT, DPT 01/09/2016 Pager: [phone]

## 2016-01-09 NOTE — Progress Notes (Signed)
Patient ID: Levette Paulick, female   DOB: 09-Apr-1946, 70 y.o.   MRN: [id number]     [facility]      [address] [place], [place] [address]    Phone: [phone] FAX: [phone]     Subjective: Had a small BM this am, passing flatus.  Waling in hallways.   Objective:  Vital signs:  Filed Vitals:   01/08/16 0529 01/08/16 1405 01/08/16 2013 01/09/16 0410  BP: 144/64 132/49 121/61 116/50  Pulse: 92 101 95 92  Temp: 97.9 F (36.6 C)  98.6 F (37 C) 98.3 F (36.8 C)  TempSrc: Oral Oral Oral Oral  Resp: '20 20 18 18  '$ Height:      Weight:      SpO2: 97% 99% 99% 98%    Last BM Date: 01/03/16  Intake/Output   Yesterday:  01/11 0701 - 01/12 0700 In: 840 [P.O.:240; I.V.:600] Out: -  This shift:     Physical Exam: General: Pt awake/alert/oriented x4 in no acute distress  Abdomen: Soft. Nondistended. 2 large hernias, soft, unable to reduce fully, no erythema.   Problem List:   Principal Problem:   SBO (small bowel obstruction) (HCC) Active Problems:   Hyperlipidemia   Essential hypertension, benign   Persistent vomiting   Nausea and vomiting   Uncontrolled type 2 diabetes mellitus ([place])   Acute kidney injury ([place])   Morbid obesity ([place])   Thrush   Abdominal pain   Right renal mass    Results:   Labs: Results for orders placed or performed during the hospital encounter of 01/06/16 (from the past 48 hour(s))  Glucose, capillary     Status: Abnormal   Collection Time: 01/07/16 11:51 AM  Result Value Ref Range   Glucose-Capillary 179 (H) 65 - 99 mg/dL  Glucose, capillary     Status: Abnormal   Collection Time: 01/07/16  4:57 PM  Result Value Ref Range   Glucose-Capillary 199 (H) 65 - 99 mg/dL  Glucose, capillary     Status: Abnormal   Collection Time: 01/07/16  7:44 PM  Result Value Ref Range   Glucose-Capillary 203 (H) 65 - 99 mg/dL  Glucose, capillary     Status: Abnormal   Collection Time: 01/07/16  11:26 PM  Result Value Ref Range   Glucose-Capillary 152 (H) 65 - 99 mg/dL   Comment 1 Notify RN    Comment 2 Document in Chart   Glucose, capillary     Status: Abnormal   Collection Time: 01/08/16  1:47 AM  Result Value Ref Range   Glucose-Capillary 107 (H) 65 - 99 mg/dL  Glucose, capillary     Status: Abnormal   Collection Time: 01/08/16  5:25 AM  Result Value Ref Range   Glucose-Capillary 149 (H) 65 - 99 mg/dL  CBC     Status: Abnormal   Collection Time: 01/08/16  5:30 AM  Result Value Ref Range   WBC 5.3 4.0 - 10.5 K/uL   RBC 3.28 (L) 3.87 - 5.11 MIL/uL   Hemoglobin 9.8 (L) 12.0 - 15.0 g/dL   HCT 30.2 (L) 36.0 - 46.0 %   MCV 92.1 78.0 - 100.0 fL   MCH 29.9 26.0 - 34.0 pg   MCHC 32.5 30.0 - 36.0 g/dL   RDW 15.1 11.5 - 15.5 %   Platelets 247 150 - 400 K/uL  Basic metabolic panel     Status: Abnormal   Collection Time: 01/08/16  5:30 AM  Result Value  Ref Range   Sodium 141 135 - 145 mmol/L   Potassium 4.6 3.5 - 5.1 mmol/L   Chloride 110 101 - 111 mmol/L   CO2 23 22 - 32 mmol/L   Glucose, Bld 163 (H) 65 - 99 mg/dL   BUN 35 (H) 6 - 20 mg/dL   Creatinine, Ser 0.84 0.44 - 1.00 mg/dL   Calcium 8.9 8.9 - 10.3 mg/dL   GFR calc non Af Amer >60 >60 mL/min   GFR calc Af Amer >60 >60 mL/min    Comment: (NOTE) The eGFR has been calculated using the CKD EPI equation. This calculation has not been validated in all clinical situations. eGFR's persistently <60 mL/min signify possible Chronic Kidney Disease.    Anion gap 8 5 - 15  Glucose, capillary     Status: Abnormal   Collection Time: 01/08/16  7:36 AM  Result Value Ref Range   Glucose-Capillary 159 (H) 65 - 99 mg/dL  Glucose, capillary     Status: Abnormal   Collection Time: 01/08/16 11:30 AM  Result Value Ref Range   Glucose-Capillary 177 (H) 65 - 99 mg/dL  Glucose, capillary     Status: Abnormal   Collection Time: 01/08/16  4:14 PM  Result Value Ref Range   Glucose-Capillary 256 (H) 65 - 99 mg/dL  Glucose, capillary      Status: Abnormal   Collection Time: 01/08/16  8:09 PM  Result Value Ref Range   Glucose-Capillary 261 (H) 65 - 99 mg/dL   Comment 1 Notify RN    Comment 2 Document in Chart   Glucose, capillary     Status: Abnormal   Collection Time: 01/08/16 11:32 PM  Result Value Ref Range   Glucose-Capillary 223 (H) 65 - 99 mg/dL   Comment 1 Notify RN    Comment 2 Document in Chart   Glucose, capillary     Status: Abnormal   Collection Time: 01/09/16  1:11 AM  Result Value Ref Range   Glucose-Capillary 162 (H) 65 - 99 mg/dL  Glucose, capillary     Status: Abnormal   Collection Time: 01/09/16  4:03 AM  Result Value Ref Range   Glucose-Capillary 139 (H) 65 - 99 mg/dL   Comment 1 Notify RN    Comment 2 Document in Chart   CBC     Status: Abnormal   Collection Time: 01/09/16  4:25 AM  Result Value Ref Range   WBC 6.0 4.0 - 10.5 K/uL   RBC 3.38 (L) 3.87 - 5.11 MIL/uL   Hemoglobin 9.8 (L) 12.0 - 15.0 g/dL   HCT 31.5 (L) 36.0 - 46.0 %   MCV 93.2 78.0 - 100.0 fL   MCH 29.0 26.0 - 34.0 pg   MCHC 31.1 30.0 - 36.0 g/dL   RDW 15.2 11.5 - 15.5 %   Platelets 266 150 - 400 K/uL  Basic metabolic panel     Status: Abnormal   Collection Time: 01/09/16  4:25 AM  Result Value Ref Range   Sodium 140 135 - 145 mmol/L   Potassium 3.9 3.5 - 5.1 mmol/L   Chloride 107 101 - 111 mmol/L   CO2 23 22 - 32 mmol/L   Glucose, Bld 151 (H) 65 - 99 mg/dL   BUN 19 6 - 20 mg/dL   Creatinine, Ser 0.74 0.44 - 1.00 mg/dL   Calcium 9.1 8.9 - 10.3 mg/dL   GFR calc non Af Amer >60 >60 mL/min   GFR calc Af Amer >60 >60 mL/min  Comment: (NOTE) The eGFR has been calculated using the CKD EPI equation. This calculation has not been validated in all clinical situations. eGFR's persistently <60 mL/min signify possible Chronic Kidney Disease.    Anion gap 10 5 - 15  Glucose, capillary     Status: Abnormal   Collection Time: 01/09/16  5:09 AM  Result Value Ref Range   Glucose-Capillary 148 (H) 65 - 99 mg/dL  Glucose,  capillary     Status: Abnormal   Collection Time: 01/09/16  7:33 AM  Result Value Ref Range   Glucose-Capillary 187 (H) 65 - 99 mg/dL    Imaging / Studies: Dg Abd 2 Views  01/08/2016  CLINICAL DATA:  Abdominal pain.  Small-bowel obstruction. EXAM: ABDOMEN - 2 VIEW COMPARISON:  Abdominal radiographs 01/07/2016. CT of the abdomen and pelvis 01/06/2016. FINDINGS: Dilation of small bowel is slightly improved. There is persistent distention of both large and small bowel in the left lower quadrant. Degenerative changes are present in the lower lumbar spine. Atherosclerotic calcifications are present. There is no free air. IMPRESSION: 1. Slight improvement and small bowel distension with persistent left-sided small bowel obstruction. Electronically Signed   By: Marin Roberts M.D.   On: 01/08/2016 08:56   Dg Abd 2 Views  01/07/2016  CLINICAL DATA:  Small-bowel obstruction EXAM: ABDOMEN - 2 VIEW COMPARISON:  01/06/2016 FINDINGS: There are several loops of mid to left abdominal small bowel which are distended up to 5.4 cm in diameter. There is air in the stomach and there are small bowel air-fluid levels. There is minimal air into the colon. IMPRESSION: Findings again consistent with small-bowel obstruction Electronically Signed   By: Esperanza Heir M.D.   On: 01/07/2016 12:17    Medications / Allergies:  Scheduled Meds: . cefTRIAXone (ROCEPHIN)  IV  1 g Intravenous Q24H  . darifenacin  15 mg Oral Daily  . enoxaparin (LOVENOX) injection  40 mg Subcutaneous Q24H  . fluticasone  1 spray Each Nare Daily  . hydrocerin   Topical BID  . insulin aspart  0-20 Units Subcutaneous 6 times per day  . mirabegron ER  50 mg Oral Daily  . nystatin  5 mL Oral QID  . omega-3 acid ethyl esters  1 g Oral Daily  . pantoprazole (PROTONIX) IV  40 mg Intravenous Q24H  . sodium chloride  3 mL Intravenous Q12H   Continuous Infusions: . sodium chloride 75 mL/hr at 01/08/16 0941   PRN Meds:.acetaminophen **OR**  acetaminophen, hydrALAZINE, morphine injection, ondansetron **OR** ondansetron (ZOFRAN) IV, oxyCODONE  Antibiotics: Anti-infectives    Start     Dose/Rate Route Frequency Ordered Stop   01/07/16 1600  cefTRIAXone (ROCEPHIN) 1 g in dextrose 5 % 50 mL IVPB     1 g 100 mL/hr over 30 Minutes Intravenous Every 24 hours 01/06/16 2125     01/06/16 1645  cefTRIAXone (ROCEPHIN) 1 g in dextrose 5 % 50 mL IVPB     1 g 100 mL/hr over 30 Minutes Intravenous  Once 01/06/16 1638 01/06/16 1733   01/06/16 1645  azithromycin (ZITHROMAX) 500 mg in dextrose 5 % 250 mL IVPB     500 mg 250 mL/hr over 60 Minutes Intravenous  Once 01/06/16 1638 01/06/16 1946         Assessment/Plan 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 Derrell Lolling SBO secondary to ventral incisional hernia-clinically improving, advance to fulls, mobilize.  Hopefully can avoid surgery, OP follow up with Dr. Derrell Lolling versus referral to a tertiary  care center for elective repair.  VTE prophylaxis-SCD/lovenox   Erby Pian, [facility] Pager [phone])   01/09/2016  8:35 AM

## 2016-01-09 NOTE — Care Management Important Message (Signed)
Important Message  Patient Details  Name: Valerie [name] MRN: [id number] Date of Birth: 09/03/1946   Medicare Important Message Given:  Yes    Haskell FlirtJamison, Avrey Flanagin 01/09/2016, 10:49 AMImportant Message  Patient Details  Name: Valerie [name] MRN: [id number] Date of Birth: 09/03/1946   Medicare Important Message Given:  Yes    Haskell FlirtJamison, Katelee Schupp 01/09/2016, 10:49 AM

## 2016-01-09 NOTE — Progress Notes (Signed)
[facility] PROGRESS NOTE  Valerie [name] NWG:[id number] DOB: May 14, 1946 DOA: 01/06/2016 PCP: Garth Schlatter, MD  Assessment/Plan: Azuri Bozard is an 70 y.o. female the PMH of overactive bladder with chronic UTIs, necrotizing pancreatitis status post partial pancreatectomy, morbid obesity, and type 2 diabetes who presents with 3 day history of N/V and abdominal pain. Pain is described as intermittent, crampy in quality, and located in lower abdomen radiating to lower back and right flank. She vomited several times (up to 6) since her symptoms began. She has been unable to tolerate oral intake. No associated diarrhea. No BMs in several days. No aggravating or alleviating factors identified. Of note, she presented to the ED  and was felt to have a urinary tract infection. CT abdomen and pelvis was ordered and was consistent with SBO. Surgery consulted and following.   Nausea and vomiting, abdominal pain ;SBO.  - CT abdomen: with finding consistent with SBO.  -continue with ceftriaxone  -X ray with some improvement. Passing gas.  -diet advance to full liquid today.   Acute kidney injury (HCC)/stage III chronic kidney disease - Baseline creatinine is 1.34. Creatinine  was 1.63, but has improved to her usual baseline values. -improving with IV fluids.  - Renal ultrasound with findings as noted above. 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  unchanged for last 2 years.. 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 Fu PCP.  -no renal mass on CT scan, enlargement of right collecting system and right ureter. Renal function improving. Needs to  follow up with urology. I made appointment with Dr Wilson Singer.   UTI; Klebsiella on urine culture  Continue with ceftriaxone. Day 3.  Elevated anion gap metabolic acidosis; Resolved.  Improving with fluids. SSI.  Treating for infection.   Hyperlipidemia - hold fenofibrate and statin. Resume when tolerates diet   Essential hypertension, benign - Hold Norvasc. PRN  hydralazine  Uncontrolled type 2 diabetes mellitus (HCC) -hold lantus due to NPO, SSI.  - hemoglobin A1c. At 6.4    Code Status: Full Code.  Family Communication: patient  Disposition Plan: remain inpatient for treatment of SBO, home when tolerating diet and SBO resolved.    Consultants:  surgery  Procedures:  [place];   Antibiotics:  Ceftriaxone.   HPI/Subjective: She pass gas today, tolerates clear diet.  No BM yet.  Having mild right side lower quadrant abdominal pain, overall abdominal -pain has improved.   Objective: Filed Vitals:   01/08/16 2013 01/09/16 0410  BP: 121/61 116/50  Pulse: 95 92  Temp: 98.6 F (37 C) 98.3 F (36.8 C)  Resp: 18 18    Intake/Output Summary (Last 24 hours) at 01/09/16 1317 Last data filed at 01/09/16 0900  Gross per 24 hour  Intake   1080 ml  Output      0 ml  Net   1080 ml   Filed Weights   01/06/16 2123  Weight: 126.4 kg (278 lb 10.6 oz)    Exam:   General:  NAD  Cardiovascular: S 1, S 2 RRR  Respiratory: CTA  Abdomen: BS, present, abdominal ventral hernia, tenderness, Abdominal wall deformity  Musculoskeletal: no edema, left LE with dry skin, mild redness.   Data Reviewed: Basic Metabolic Panel:  Recent Labs Lab 01/05/16 0855 01/06/16 1448 01/07/16 0355 01/08/16 0530 01/09/16 0425  NA 134* 135 136 141 140  K 4.6 4.4 4.3 4.6 3.9  CL 96* 95* 101 110 107  CO2 21* 24 23 23 23   GLUCOSE 297* 232* 294* 163* 151*  BUN 76* 79*  58* 35* 19  CREATININE 1.63* 1.37* 1.24* 0.84 0.74  CALCIUM 9.6 9.9 9.2 8.9 9.1   Liver Function Tests:  Recent Labs Lab 01/05/16 0855 01/06/16 1448  AST 16 15  ALT 13* 13*  ALKPHOS 52 54  BILITOT 0.9 0.8  PROT 7.6 8.1  ALBUMIN 3.2* 3.5    Recent Labs Lab 01/05/16 0855 01/06/16 1448  LIPASE 21 21   No results for input(s): AMMONIA in the last 168 hours. CBC:  Recent Labs Lab 01/05/16 0855 01/06/16 1448 01/08/16 0530 01/09/16 0425  WBC 6.4 5.6 5.3 6.0  HGB  11.5* 11.7* 9.8* 9.8*  HCT 35.7* 37.2 30.2* 31.5*  MCV 91.3 91.9 92.1 93.2  PLT 336 327 247 266   Cardiac Enzymes:  Recent Labs Lab 01/06/16 1448  TROPONINI 0.05*   BNP (last 3 results) No results for input(s): BNP in the last 8760 hours.  ProBNP (last 3 results) No results for input(s): PROBNP in the last 8760 hours.  CBG:  Recent Labs Lab 01/09/16 0111 01/09/16 0403 01/09/16 0509 01/09/16 0733 01/09/16 1148  GLUCAP 162* 139* 148* 187* 219*    Recent Results (from the past 240 hour(s))  Urine culture     Status: None   Collection Time: 01/05/16  1:30 PM  Result Value Ref Range Status   Specimen Description URINE, CLEAN CATCH  Final   Special Requests none Normal  Final   Culture   Final    >=100,000 COLONIES/mL KLEBSIELLA PNEUMONIAE [facility]    Report Status 01/07/2016 FINAL  Final   Organism ID, Bacteria KLEBSIELLA PNEUMONIAE  Final      Susceptibility   Klebsiella pneumoniae - MIC*    AMPICILLIN >=32 RESISTANT Resistant     CEFAZOLIN <=4 SENSITIVE Sensitive     CEFTRIAXONE <=1 SENSITIVE Sensitive     CIPROFLOXACIN <=0.25 SENSITIVE Sensitive     GENTAMICIN <=1 SENSITIVE Sensitive     IMIPENEM <=0.25 SENSITIVE Sensitive     NITROFURANTOIN 64 INTERMEDIATE Intermediate     TRIMETH/SULFA <=20 SENSITIVE Sensitive     AMPICILLIN/SULBACTAM 8 SENSITIVE Sensitive     PIP/TAZO <=4 SENSITIVE Sensitive     * >=100,000 COLONIES/mL KLEBSIELLA PNEUMONIAE  Blood culture (routine x 2)     Status: None (Preliminary result)   Collection Time: 01/06/16  3:13 PM  Result Value Ref Range Status   Specimen Description BLOOD LEFT ANTECUBITAL  Final   Special Requests BOTTLES DRAWN AEROBIC AND ANAEROBIC 5ML  Final   Culture   Final    NO GROWTH 3 DAYS [facility]    Report Status PENDING  Incomplete  Blood culture (routine x 2)     Status: None (Preliminary result)   Collection Time: 01/06/16  3:14 PM  Result Value Ref Range  Status   Specimen Description BLOOD RIGHT FOREARM  Final   Special Requests BOTTLES DRAWN AEROBIC ONLY 6ML  Final   Culture   Final    NO GROWTH 3 DAYS [facility]formed at [facility]    Report Status PENDING  Incomplete     Studies: Dg Abd 2 Views  01/08/2016  CLINICAL DATA:  Abdominal pain.  Small-bowel obstruction. EXAM: ABDOMEN - 2 VIEW COMPARISON:  Abdominal radiographs 01/07/2016. CT of the abdomen and pelvis 01/06/2016. FINDINGS: Dilation of small bowel is slightly improved. There is persistent distention of both large and small bowel in the left lower quadrant. Degenerative changes are present in the lower lumbar spine. Atherosclerotic calcifications are present.  There is no free air. IMPRESSION: 1. Slight improvement and small bowel distension with persistent left-sided small bowel obstruction. Electronically Signed   By: Marin Roberts M.D.   On: 01/08/2016 08:56    Scheduled Meds: . cefTRIAXone (ROCEPHIN)  IV  1 g Intravenous Q24H  . darifenacin  15 mg Oral Daily  . enoxaparin (LOVENOX) injection  40 mg Subcutaneous Q24H  . fluticasone  1 spray Each Nare Daily  . hydrocerin   Topical BID  . insulin aspart  0-20 Units Subcutaneous 6 times per day  . mirabegron ER  50 mg Oral Daily  . nystatin  5 mL Oral QID  . omega-3 acid ethyl esters  1 g Oral Daily  . pantoprazole (PROTONIX) IV  40 mg Intravenous Q24H  . sodium chloride  3 mL Intravenous Q12H   Continuous Infusions: . sodium chloride 75 mL/hr at 01/09/16 1235    Principal Problem:   SBO (small bowel obstruction) (HCC) Active Problems:   Hyperlipidemia   Essential hypertension, benign   Persistent vomiting   Nausea and vomiting   Uncontrolled type 2 diabetes mellitus (HCC)   Acute kidney injury (HCC)   Morbid obesity (HCC)   Thrush   Abdominal pain   Right renal mass    Time spent: 35 minutes.     Hartley Barefoot A  [facility] Pager [phone]. If 7PM-7AM, please contact night-coverage  at [email or web address], password [facility] 01/09/2016, 1:17 PM  LOS: 3 days

## 2016-01-10 DIAGNOSIS — B961 Klebsiella pneumoniae [K. pneumoniae] as the cause of diseases classified elsewhere: Secondary | ICD-10-CM

## 2016-01-10 DIAGNOSIS — E1169 Type 2 diabetes mellitus with other specified complication: Secondary | ICD-10-CM

## 2016-01-10 DIAGNOSIS — N183 Chronic kidney disease, stage 3 (moderate): Secondary | ICD-10-CM

## 2016-01-10 DIAGNOSIS — N179 Acute kidney failure, unspecified: Secondary | ICD-10-CM

## 2016-01-10 DIAGNOSIS — K5669 Other intestinal obstruction: Secondary | ICD-10-CM

## 2016-01-10 DIAGNOSIS — I10 Essential (primary) hypertension: Secondary | ICD-10-CM

## 2016-01-10 DIAGNOSIS — E785 Hyperlipidemia, unspecified: Secondary | ICD-10-CM

## 2016-01-10 LAB — GLUCOSE, CAPILLARY
GLUCOSE-CAPILLARY: 188 mg/dL — AB (ref 65–99)
GLUCOSE-CAPILLARY: 205 mg/dL — AB (ref 65–99)
Glucose-Capillary: 232 mg/dL — ABNORMAL HIGH (ref 65–99)
Glucose-Capillary: 140 mg/dL — ABNORMAL HIGH (ref 65–99)
Glucose-Capillary: 177 mg/dL — ABNORMAL HIGH (ref 65–99)
Glucose-Capillary: 235 mg/dL — ABNORMAL HIGH (ref 65–99)
Glucose-Capillary: 251 mg/dL — ABNORMAL HIGH (ref 65–99)

## 2016-01-10 MED ORDER — BISACODYL 10 MG RE SUPP
10.0000 mg | Freq: Once | RECTAL | Status: AC
  Administered 2016-01-10: 10 mg via RECTAL
  Filled 2016-01-10: qty 1

## 2016-01-10 MED ORDER — DOXYCYCLINE HYCLATE 100 MG PO TABS
100.0000 mg | ORAL_TABLET | Freq: Two times a day (BID) | ORAL | Status: DC
  Administered 2016-01-10 – 2016-01-13 (×7): 100 mg via ORAL
  Filled 2016-01-10 (×7): qty 1

## 2016-01-10 NOTE — Progress Notes (Signed)
Patient ID: Valerie [name], female   DOB: 02/15/1946, 70 y.o.   MRN: [id number] [facility] PROGRESS NOTE  Jordie Skalsky WUJ:[id number] DOB: 26-Feb-1946 DOA: 01/06/2016 PCP: Garth Schlatter, MD  Brief narrative:    70 y.o. female with past medical history significant for overactive bladder, chronic UTIs, necrotizing pancreatitis status post partial pancreatectomy, morbid obesity, type 2 diabetes mellitus who presented to [facility] with nausea, vomiting and diffuse abdominal pain for 3 days prior to this admission. Patient reported diffuse pain but mostly located in lower abdomen radiating to the lower back. She vomited few times prior to this admission. She also reported poor by mouth intake. No associated fevers or chills. No diarrhea.  She was hemodynamically stable on the admission. She was found to have UTI in addition to small bowel obstruction. Surgery has seen her in consultation with recommendation for conservative management.   Assessment/Plan:    Principal problem: Nausea and vomiting, lower abdominal pain / small bowel obstruction - Nausea and vomiting and abdominal pain likely related to small bowel obstruction due to right paracentral hernia as evidenced on CT scan - Appreciate surgery following - Patient is slowly improving, her diet will be advanced to regular today per surgery recommendations - Continue IV fluids until appetite improves  Active problems: Urinary tract infection secondary to Klebsiella pneumonia - Patient is on Rocephin and will continue IV antibiotics until appetite improves - Based on sensitivity report is okay to continue Rocephin  Acute renal failure superimposed on chronic kidney disease stage III - Baseline creatinine is 1.34 and on this admission 1.63 - Creatinine has normalized with fluids - Patient did have renal ultrasound which demonstrated right renal mass likely adrenal lesion unchanged compared to the studies done 2 years  prior - No renal mass identified on CT scan  Elevated anion gap metabolic acidosis - Likely related to acute infection, UTI - Improved with fluids  Dyslipidemia associated with diabetes mellitus type 2 - Home medications on hold until by mouth intake improves.  Essential hypertension, benign - Blood pressure stable off of blood pressure medications. Continue to monitor.  Left lower extremity cellulitis - Seen by wound care, appreciate their assessment - Patient does have more erythema on left lower extremities and therefore it's reasonable to start empiric doxycycline  Controlled type 2 diabetes mellitus with peripheral vascular complications with long-term insulin use (HCC) - A1c is 6.4, indicating good glycemic control  - Continue sliding scale insulin for now   DVT Prophylaxis  - Lovenox subcutaneous ordered  Code Status: Full.  Family Communication:  plan of care discussed with the patient and her husband at the bedside  Disposition Plan: home likely by 01/12/2016  IV access:  Peripheral IV  Procedures and diagnostic studies:    Ct Abdomen Pelvis Wo Contrast 01/06/2016 1. Small bowel obstruction due to a right paracentral hernia containing a loop of small bowel with dilated the efferent component and nondilated efferent component. There is probably some fluid in this hernia sac and strangulation is not excluded. 2. There several left paramedian hernia sacs, 1 containing transverse colon and the lower hernia containing a dilated loop of small bowel. This left-sided hernia is not thought to be the site of obstruction. 3. Bilateral panniculitis left greater than right. 4. Fullness of the right collecting system and right proximal ureter possibly with slight wall thickening in the right proximal collecting system. Inflammation in this vicinity is not excluded but I do not see urinary tract calculi 5. Clustered  tiny gallstones observed. 6. Poor definition of fat planes along the  pancreatic tail, quite likely from chronic scarring although active pancreatitis is not completely excluded. Atrophic pancreatic body and head likely from remote pancreatic injury. 7. Prominent dense calcification of the mitral valve with mild cardiomegaly. 8. Atelectasis in the left lower lobe. 9. Calcification situated between the anterior uterine body in urinary bladder probably a subserosal calcified fibroid. 10. Lower lumbar spondylosis and degenerative disc disease with potential mild impingement at L4-5. 11. Lucencies along the right acetabular roof and posterior acetabulum with surrounding ground-glass matrix bone, query fibrous dysplasia and/or geodes with surrounding reactive findings. Electronically Signed   By: Gaylyn Rong M.D.   On: 01/06/2016 19:20   US Abdomen Complete 01/06/2016  Cholelithiasis. No gallbladder wall thickening or pericholecystic fluid. Fullness of the right renal pelvis and upper pole collecting system. Obstructing lesion not appreciable by ultrasound. This finding may warrant noncontrast enhanced CT to assess for potential ureteral calculus. Probable right renal mass measuring 2.5 x 1.3 x 2.5 cm. CT could also be helpful to further assess this area. Study otherwise unremarkable. Note that portions of pancreas not seen due to overlying gas.   Dg Abd 2 Views 01/08/2016  1. Slight improvement and small bowel distension with persistent left-sided small bowel obstruction. Electronically Signed   By: Marin Roberts M.D.   On: 01/08/2016 08:56   Dg Abd 2 Views 01/07/2016 Findings again consistent with small-bowel obstruction Electronically Signed   By: Esperanza Heir M.D.   On: 01/07/2016 12:17   Dg Abd Acute W/chest 01/06/2016  1. Patchy left lower lobe atelectasis or infiltrate. 2. No acute abdominal findings demonstrated. 3. Right mid and pelvic calcifications as described. The former could reflect a renal calculus or gallstone. Electronically Signed   By: Carey Bullocks  M.D.   On: 01/06/2016 14:56    Medical Consultants:  Surgery  Other Consultants:  PT Nutrition  IAnti-Infectives:   Rocephin 01/06/2016 --> Doxycyline 01/10/2016 -->    Manson Passey, MD  [facility] Pager [phone]  Time spent in minutes: 25 minutes  If 7PM-7AM, please contact night-coverage [email or web address] Password [facility] 01/10/2016, 12:34 PM   LOS: 4 days    HPI/Subjective: No acute overnight events. Patient reports no abdominal pain. She reports left lower extremity pain.  Objective: Filed Vitals:   01/09/16 1500 01/09/16 2030 01/10/16 0034 01/10/16 0427  BP: 130/60 140/55 143/65 127/72  Pulse: 98 94 92 82  Temp: 98.2 F (36.8 C) 99.3 F (37.4 C) 98.8 F (37.1 C) 98.4 F (36.9 C)  TempSrc: Oral Oral Oral Oral  Resp: 20 20 18 20   Height:      Weight:      SpO2: 98% 100% 97% 100%    Intake/Output Summary (Last 24 hours) at 01/10/16 1234 Last data filed at 01/10/16 0700  Gross per 24 hour  Intake 2457.5 ml  Output    653 ml  Net 1804.5 ml    Exam:   General:  Pt is alert, follows commands appropriately, not in acute distress  Cardiovascular: Regular rate and rhythm, S1/S2 (+)  Respiratory: Clear to auscultation bilaterally, no wheezing, no crackles, no rhonchi  Abdomen: 2 large abdominal hernias, nontender to light palpation, appreciate bowel sounds  Extremities: Bilateral lower extremity edema, left lower extremity erythematosus around ankle and slightly above the ankle with tenderness to palpation  Neuro: Grossly nonfocal  Data Reviewed: Basic Metabolic Panel:  Recent Labs Lab 01/05/16 0855 01/06/16 1448 01/07/16 0355 01/08/16 0530  01/09/16 0425  NA 134* 135 136 141 140  K 4.6 4.4 4.3 4.6 3.9  CL 96* 95* 101 110 107  CO2 21* 24 23 23 23   GLUCOSE 297* 232* 294* 163* 151*  BUN 76* 79* 58* 35* 19  CREATININE 1.63* 1.37* 1.24* 0.84 0.74  CALCIUM 9.6 9.9 9.2 8.9 9.1   Liver Function Tests:  Recent Labs Lab 01/05/16 0855  01/06/16 1448  AST 16 15  ALT 13* 13*  ALKPHOS 52 54  BILITOT 0.9 0.8  PROT 7.6 8.1  ALBUMIN 3.2* 3.5    Recent Labs Lab 01/05/16 0855 01/06/16 1448  LIPASE 21 21   No results for input(s): AMMONIA in the last 168 hours. CBC:  Recent Labs Lab 01/05/16 0855 01/06/16 1448 01/08/16 0530 01/09/16 0425  WBC 6.4 5.6 5.3 6.0  HGB 11.5* 11.7* 9.8* 9.8*  HCT 35.7* 37.2 30.2* 31.5*  MCV 91.3 91.9 92.1 93.2  PLT 336 327 247 266   Cardiac Enzymes:  Recent Labs Lab 01/06/16 1448  TROPONINI 0.05*   BNP: Invalid input(s): POCBNP CBG:  Recent Labs Lab 01/09/16 2028 01/10/16 0030 01/10/16 0409 01/10/16 0808 01/10/16 1147  GLUCAP 307* 232* 140* 177* 235*    Urine culture     Status: None   Collection Time: 01/05/16  1:30 PM  Result Value Ref Range Status   Specimen Description URINE, CLEAN CATCH  Final   Special Requests none Normal  Final   Culture   Final   Report Status 01/07/2016 FINAL  Final   Organism ID, Bacteria KLEBSIELLA PNEUMONIAE  Final      Susceptibility   Klebsiella pneumoniae - MIC*    AMPICILLIN >=32 RESISTANT Resistant     CEFAZOLIN <=4 SENSITIVE Sensitive     CEFTRIAXONE <=1 SENSITIVE Sensitive     CIPROFLOXACIN <=0.25 SENSITIVE Sensitive     GENTAMICIN <=1 SENSITIVE Sensitive     IMIPENEM <=0.25 SENSITIVE Sensitive     NITROFURANTOIN 64 INTERMEDIATE Intermediate     TRIMETH/SULFA <=20 SENSITIVE Sensitive     AMPICILLIN/SULBACTAM 8 SENSITIVE Sensitive     PIP/TAZO <=4 SENSITIVE Sensitive     * >=100,000 COLONIES/mL KLEBSIELLA PNEUMONIAE  Blood culture (routine x 2)     Status: None (Preliminary result)   Collection Time: 01/06/16  3:13 PM  Result Value Ref Range Status   Specimen Description BLOOD LEFT ANTECUBITAL  Final   Special Requests BOTTLES DRAWN AEROBIC AND ANAEROBIC 5ML  Final   Culture   Final    NO GROWTH 3 DAYS Performed at [facility]    Report Status PENDING  Incomplete  Blood culture (routine x 2)     Status:  None (Preliminary result)   Collection Time: 01/06/16  3:14 PM  Result Value Ref Range Status   Specimen Description BLOOD RIGHT FOREARM  Final   Special Requests BOTTLES DRAWN AEROBIC ONLY 6ML  Final   Culture   Final    NO GROWTH 3 DAYS Performed at [facility]    Report Status PENDING  Incomplete     Scheduled Meds: . cefTRIAXone (ROCEPHIN)  IV  1 g Intravenous Q24H  . darifenacin  15 mg Oral Daily  . doxycycline  100 mg Oral Q12H  . enoxaparin (LOVENOX) injection  40 mg Subcutaneous Q24H  . fluticasone  1 spray Each Nare Daily  . hydrocerin   Topical BID  . insulin aspart  0-20 Units Subcutaneous 6 times per day  . mirabegron ER  50 mg Oral Daily  .  nystatin  5 mL Oral QID  . omega-3 acid ethyl esters  1 g Oral Daily  . pantoprazole (PROTONIX) IV  40 mg Intravenous Q24H  . sodium chloride  3 mL Intravenous Q12H   Continuous Infusions: . sodium chloride 75 mL/hr at 01/10/16 0054

## 2016-01-10 NOTE — Progress Notes (Signed)
Inpatient Diabetes Program Recommendations  AACE/ADA: New Consensus Statement on Inpatient Glycemic Control (2015)  Target Ranges:  Prepandial:   less than 140 mg/dL      Peak postprandial:   less than 180 mg/dL (1-2 hours)      Critically ill patients:  140 - 180 mg/dL   Review of Glycemic Control Results for Valerie [name], Valerie [name] (MRN [id number]) as of 01/10/2016 15:32  Ref. Range 01/06/2016 14:48  Hemoglobin A1C Latest Ref Range: 4.8-5.6 % 6.4 (H)   Results for Valerie [name], Valerie [name] (MRN [id number]) as of 01/10/2016 15:32  Ref. Range 01/09/2016 11:48 01/09/2016 16:30 01/09/2016 20:28 01/10/2016 00:30 01/10/2016 04:09 01/10/2016 08:08 01/10/2016 11:47  Glucose-Capillary Latest Ref Range: 65-99 mg/dL 621219 (H) 308219 (H) 657307 (H) 232 (H) 140 (H) 177 (H) 235 (H)  Post-prandial blood sugars elevated. Doubtful HgbA1C is accurate with low H/H.  Inpatient Diabetes Program Recommendations:    Consider addition of Novolog 3 units tidwc for meal coverage insulin.  Will continue to follow. Thank you. Ailene Ardshonda Alrick Cubbage, RD, LDN, CDE Inpatient Diabetes Coordinator [phone]

## 2016-01-10 NOTE — Progress Notes (Signed)
Nutrition Follow-up  DOCUMENTATION CODES:   Morbid obesity  INTERVENTION:  - Continue Soft diet - RD will continue to monitor for needs  NUTRITION DIAGNOSIS:   Inadequate oral intake related to inability to eat as evidenced by NPO status. -resolving with diet advancement  GOAL:   Patient will meet greater than or equal to 90% of their needs -unmet  MONITOR:   PO intake, Weight trends, Labs, Skin, I & O's  ASSESSMENT:   70 y.o. female the PMH of overactive bladder with chronic UTIs, necrotizing pancreatitis status post partial pancreatectomy, morbid obesity, and type 2 diabetes who presents with 3 day history of N/V and abdominal pain. Pain is described as intermittent, crampy in quality, and located in lower abdomen radiating to lower back and right flank. She vomited several times (up to 6) since her symptoms began. She has been unable to tolerate oral intake. No associated diarrhea. No BMs in several days. No aggravating or alleviating factors identified. Of note, she presented to the ED yesterday and was felt to have a urinary tract infection.  1/13 Diet advancement as follows:  1/10 @ 1038: NPO 1/11 @ 0902: CLD 1/12 @ 0836: FLD 1/13 @ 0856: Soft  Chart review indicates pt consumed 75% of breakfast (CLD) and 75% of dinner (FLD) yesterday with no other intakes documented since previous RD assessment. Pt states that for breakfast she took a few bites each of grits, chocolate pudding, and orange sherbet and that she had a few sips of milk and coffee. For lunch she has ordered mashed potatoes, grilled chicken sandwich, and orange sherbet. Pt denies abdominal pain from PO intakes and denies any nausea. She does not have any questions or concerns at this time.  Will monitor for ability to educate concerning morbid obesity, but do not feel this is appropriate until current dx is resolved and pt is better able to focus on healthy long-term nutrition goals.   Not meeting needs  currently. Medications reviewed. Labs reviewed; CBGs: 107-307 mg/dL.   1/10 - Pt seen for MST and consult for education related to morbid obesity; will provide education after diet is advanced/closer to d/c given current dx.  - Pt is NPO with SBO without NGT.  - Pt states that she last ate solid food (a steak) on 1/5.  - Since that time she has been able to drink liquids but has not had solid food.  - Pt and husband, who is at bedside, state that starting 1/6 pt was vomiting constantly and that last episode of emesis was yesterday (1/9) AM.  - Pt denies abdominal pain or nausea at this time.  - Both state that over the past few weeks pt's appetite has been declining and that she has mainly only been able to take a few bites at each meal before feeling full.  - Pt denies abdominal pain or nausea with these intakes over the past few weeks but does state that food has "tasted different" although she is unable to further specify on this taste alteration.  - She states that over the past 1 year she has lost 27 lbs.  - Pt is unsure of how much she weighed 1-2 months ago to use as comparison for CBW; weights are available in the chart.  - Per chart review, pt has lost 13 lbs (5% body weight) in the past 3-3.5 months which is not significant for time frame.  - Physical assessment shows no muscle or fat wasting at this time.  -  Notes indicate pt has thrush.   Diet Order:  DIET SOFT Room service appropriate?: Yes; Fluid consistency:: Thin  Skin:  Wound (see comment) (DM ulcers to bilat legs and toes)  Last BM:  1/6  Height:   Ht Readings from Last 1 Encounters:  01/06/16 5\' 4"  (1.626 m)    Weight:   Wt Readings from Last 1 Encounters:  01/06/16 278 lb 10.6 oz (126.4 kg)    Ideal Body Weight:  54.54 kg (kg)  BMI:  Body mass index is 47.81 kg/(m^2).  Estimated Nutritional Needs:   Kcal:  1400-1600  Protein:  70-85 grams  Fluid:  2 L/day  EDUCATION NEEDS:   No education needs  identified at this time     Trenton Gammon, RD, LDN Inpatient Clinical Dietitian Pager # [phone] After hours/weekend pager # [phone]

## 2016-01-10 NOTE — Progress Notes (Addendum)
Patient complaining of left wrist pain. Tender to touch. Swelling noted in left hand. Heat pack applied. Will continue to monitor closely

## 2016-01-10 NOTE — Progress Notes (Signed)
Patient ID: Valerie [name], female   DOB: 1946/07/10, 70 y.o.   MRN: [id number]     [address] [place], [place] [address]    Phone: [phone] FAX: [phone]     Subjective: No abd pain.  No flatus or BM.  C/o left wrist pain.   Objective:  Vital signs:  Filed Vitals:   01/09/16 1500 01/09/16 2030 01/10/16 0034 01/10/16 0427  BP: 130/60 140/55 143/65 127/72  Pulse: 98 94 92 82  Temp: 98.2 F (36.8 C) 99.3 F (37.4 C) 98.8 F (37.1 C) 98.4 F (36.9 C)  TempSrc: Oral Oral Oral Oral  Resp: '20 20 18 20  '$ Height:      Weight:      SpO2: 98% 100% 97% 100%    Last BM Date: 01/03/16  Intake/Output   Yesterday:  01/12 0701 - 01/13 0700 In: 2697.5 [P.O.:720; I.V.:1827.5; IV Piggyback:150] Out: 653 [Urine:653] This shift:    I/O last 3 completed shifts: In: 2937.5 [P.O.:960; I.V.:1827.5; IV Piggyback:150] Out: 653 [Urine:653]    Physical Exam: General: Pt awake/alert/oriented x4 in no acute distress  Abdomen: Soft. Nondistended. 2 large hernias, soft, unable to reduce fully, no erythema.     Problem List:   Principal Problem:   SBO (small bowel obstruction) (HCC) Active Problems:   Hyperlipidemia   Essential hypertension, benign   Persistent vomiting   Nausea and vomiting   Uncontrolled type 2 diabetes mellitus ([place])   Acute kidney injury ([place])   Morbid obesity ([place])   Thrush   Abdominal pain   Right renal mass    Results:   Labs: Results for orders placed or performed during the hospital encounter of 01/06/16 (from the past 48 hour(s))  Glucose, capillary     Status: Abnormal   Collection Time: 01/08/16 11:30 AM  Result Value Ref Range   Glucose-Capillary 177 (H) 65 - 99 mg/dL  Glucose, capillary     Status: Abnormal   Collection Time: 01/08/16  4:14 PM  Result Value Ref Range   Glucose-Capillary 256 (H) 65 - 99 mg/dL  Glucose, capillary     Status: Abnormal   Collection  Time: 01/08/16  8:09 PM  Result Value Ref Range   Glucose-Capillary 261 (H) 65 - 99 mg/dL   Comment 1 Notify RN    Comment 2 Document in Chart   Glucose, capillary     Status: Abnormal   Collection Time: 01/08/16 11:32 PM  Result Value Ref Range   Glucose-Capillary 223 (H) 65 - 99 mg/dL   Comment 1 Notify RN    Comment 2 Document in Chart   Glucose, capillary     Status: Abnormal   Collection Time: 01/09/16  1:11 AM  Result Value Ref Range   Glucose-Capillary 162 (H) 65 - 99 mg/dL  Glucose, capillary     Status: Abnormal   Collection Time: 01/09/16  4:03 AM  Result Value Ref Range   Glucose-Capillary 139 (H) 65 - 99 mg/dL   Comment 1 Notify RN    Comment 2 Document in Chart   CBC     Status: Abnormal   Collection Time: 01/09/16  4:25 AM  Result Value Ref Range   WBC 6.0 4.0 - 10.5 K/uL   RBC 3.38 (L) 3.87 - 5.11 MIL/uL   Hemoglobin 9.8 (L) 12.0 - 15.0 g/dL   HCT 31.5 (L) 36.0 - 46.0 %   MCV 93.2 78.0 - 100.0  fL   MCH 29.0 26.0 - 34.0 pg   MCHC 31.1 30.0 - 36.0 g/dL   RDW 15.2 11.5 - 15.5 %   Platelets 266 150 - 400 K/uL  Basic metabolic panel     Status: Abnormal   Collection Time: 01/09/16  4:25 AM  Result Value Ref Range   Sodium 140 135 - 145 mmol/L   Potassium 3.9 3.5 - 5.1 mmol/L   Chloride 107 101 - 111 mmol/L   CO2 23 22 - 32 mmol/L   Glucose, Bld 151 (H) 65 - 99 mg/dL   BUN 19 6 - 20 mg/dL   Creatinine, Ser 0.74 0.44 - 1.00 mg/dL   Calcium 9.1 8.9 - 10.3 mg/dL   GFR calc non Af Amer >60 >60 mL/min   GFR calc Af Amer >60 >60 mL/min    Comment: (NOTE) The eGFR has been calculated using the CKD EPI equation. This calculation has not been validated in all clinical situations. eGFR's persistently <60 mL/min signify possible Chronic Kidney Disease.    Anion gap 10 5 - 15  Glucose, capillary     Status: Abnormal   Collection Time: 01/09/16  5:09 AM  Result Value Ref Range   Glucose-Capillary 148 (H) 65 - 99 mg/dL  Glucose, capillary     Status: Abnormal    Collection Time: 01/09/16  7:33 AM  Result Value Ref Range   Glucose-Capillary 187 (H) 65 - 99 mg/dL  Glucose, capillary     Status: Abnormal   Collection Time: 01/09/16 11:48 AM  Result Value Ref Range   Glucose-Capillary 219 (H) 65 - 99 mg/dL  Glucose, capillary     Status: Abnormal   Collection Time: 01/09/16  4:30 PM  Result Value Ref Range   Glucose-Capillary 219 (H) 65 - 99 mg/dL  Glucose, capillary     Status: Abnormal   Collection Time: 01/09/16  8:28 PM  Result Value Ref Range   Glucose-Capillary 307 (H) 65 - 99 mg/dL   Comment 1 Notify RN    Comment 2 Document in Chart   Glucose, capillary     Status: Abnormal   Collection Time: 01/10/16 12:30 AM  Result Value Ref Range   Glucose-Capillary 232 (H) 65 - 99 mg/dL   Comment 1 Notify RN    Comment 2 Document in Chart   Glucose, capillary     Status: Abnormal   Collection Time: 01/10/16  4:09 AM  Result Value Ref Range   Glucose-Capillary 140 (H) 65 - 99 mg/dL   Comment 1 Notify RN    Comment 2 Document in Chart   Glucose, capillary     Status: Abnormal   Collection Time: 01/10/16  8:08 AM  Result Value Ref Range   Glucose-Capillary 177 (H) 65 - 99 mg/dL    Imaging / Studies: No results found.  Medications / Allergies:  Scheduled Meds: . bisacodyl  10 mg Rectal Once  . cefTRIAXone (ROCEPHIN)  IV  1 g Intravenous Q24H  . darifenacin  15 mg Oral Daily  . enoxaparin (LOVENOX) injection  40 mg Subcutaneous Q24H  . fluticasone  1 spray Each Nare Daily  . hydrocerin   Topical BID  . insulin aspart  0-20 Units Subcutaneous 6 times per day  . mirabegron ER  50 mg Oral Daily  . nystatin  5 mL Oral QID  . omega-3 acid ethyl esters  1 g Oral Daily  . pantoprazole (PROTONIX) IV  40 mg Intravenous Q24H  . sodium chloride  3 mL Intravenous Q12H   Continuous Infusions: . sodium chloride 75 mL/hr at 01/10/16 0054   PRN Meds:.acetaminophen **OR** acetaminophen, hydrALAZINE, morphine injection, ondansetron **OR**  ondansetron (ZOFRAN) IV, oxyCODONE  Antibiotics: Anti-infectives    Start     Dose/Rate Route Frequency Ordered Stop   01/07/16 1600  cefTRIAXone (ROCEPHIN) 1 g in dextrose 5 % 50 mL IVPB     1 g 100 mL/hr over 30 Minutes Intravenous Every 24 hours 01/06/16 2125     01/06/16 1645  cefTRIAXone (ROCEPHIN) 1 g in dextrose 5 % 50 mL IVPB     1 g 100 mL/hr over 30 Minutes Intravenous  Once 01/06/16 1638 01/06/16 1733   01/06/16 1645  azithromycin (ZITHROMAX) 500 mg in dextrose 5 % 250 mL IVPB     500 mg 250 mL/hr over 60 Minutes Intravenous  Once 01/06/16 1638 01/06/16 1946           Assessment/Plan Hx laparotomy to necrotizing pancreatitis at age 66 by Dr. Dalbert Batman SBO secondary to ventral incisional hernia-clinically improving, advance to soft diet, mobilize. give dulcolax x1. Hopefully can avoid surgery, OP follow up with Dr. Rosendo Gros versus referral to a tertiary care center for elective repair.  VTE prophylaxis-SCD/lovenox   Erby Pian, [facility] Surgery Pager [phone])   01/10/2016 8:56 AM

## 2016-01-11 LAB — GLUCOSE, CAPILLARY
GLUCOSE-CAPILLARY: 155 mg/dL — AB (ref 65–99)
GLUCOSE-CAPILLARY: 159 mg/dL — AB (ref 65–99)
GLUCOSE-CAPILLARY: 225 mg/dL — AB (ref 65–99)
Glucose-Capillary: 208 mg/dL — ABNORMAL HIGH (ref 65–99)
Glucose-Capillary: 245 mg/dL — ABNORMAL HIGH (ref 65–99)

## 2016-01-11 LAB — CULTURE, BLOOD (ROUTINE X 2)
CULTURE: NO GROWTH
CULTURE: NO GROWTH

## 2016-01-11 MED ORDER — FENOFIBRATE 160 MG PO TABS
160.0000 mg | ORAL_TABLET | Freq: Every day | ORAL | Status: DC
  Administered 2016-01-11 – 2016-01-13 (×3): 160 mg via ORAL
  Filled 2016-01-11 (×3): qty 1

## 2016-01-11 MED ORDER — INSULIN ASPART 100 UNIT/ML ~~LOC~~ SOLN
0.0000 [IU] | Freq: Three times a day (TID) | SUBCUTANEOUS | Status: DC
  Administered 2016-01-12 – 2016-01-13 (×4): 7 [IU] via SUBCUTANEOUS

## 2016-01-11 MED ORDER — ATORVASTATIN CALCIUM 10 MG PO TABS
10.0000 mg | ORAL_TABLET | Freq: Every day | ORAL | Status: DC
  Administered 2016-01-11 – 2016-01-13 (×3): 10 mg via ORAL
  Filled 2016-01-11 (×3): qty 1

## 2016-01-11 MED ORDER — AMLODIPINE BESYLATE 10 MG PO TABS
10.0000 mg | ORAL_TABLET | Freq: Every day | ORAL | Status: DC
  Administered 2016-01-11 – 2016-01-13 (×3): 10 mg via ORAL
  Filled 2016-01-11 (×3): qty 1

## 2016-01-11 MED ORDER — INSULIN ASPART 100 UNIT/ML ~~LOC~~ SOLN
3.0000 [IU] | Freq: Three times a day (TID) | SUBCUTANEOUS | Status: DC
  Administered 2016-01-11 – 2016-01-12 (×3): 3 [IU] via SUBCUTANEOUS

## 2016-01-11 MED ORDER — FENOFIBRATE 160 MG PO TABS: 160.0000 mg | ORAL_TABLET | Freq: Every day | ORAL | Status: DC

## 2016-01-11 MED ORDER — ATORVASTATIN CALCIUM 10 MG PO TABS: 10.0000 mg | ORAL_TABLET | Freq: Every day | ORAL | Status: DC

## 2016-01-11 MED ORDER — INSULIN ASPART 100 UNIT/ML ~~LOC~~ SOLN
0.0000 [IU] | Freq: Every day | SUBCUTANEOUS | Status: DC
  Administered 2016-01-11 – 2016-01-12 (×2): 2 [IU] via SUBCUTANEOUS

## 2016-01-11 NOTE — Progress Notes (Signed)
Patient ID: Valerie DurhamBeverly Barsamian, female   DOB: 1946-02-25, 70 y.o.   MRN: [id number] [facility] PROGRESS NOTE  Valerie DurhamBeverly Valone IPJ:[id number] DOB: 1946-02-25 DOA: 01/06/2016 PCP: Garth SchlatterAMEEN, WILLIAM OTIS, MD  Brief narrative:    70 y.o. female with past medical history significant for overactive bladder, chronic UTIs, necrotizing pancreatitis status post partial pancreatectomy, morbid obesity, type 2 diabetes mellitus who presented to [facility] with nausea, vomiting and diffuse abdominal pain for 3 days prior to this admission. Patient reported diffuse pain but mostly located in lower abdomen radiating to the lower back. She vomited few times prior to this admission. She also reported poor by mouth intake. No associated fevers or chills. No diarrhea.  She was hemodynamically stable on the admission. She was found to have UTI in addition to small bowel obstruction. Surgery has seen her in consultation with recommendation for conservative management.   Assessment/Plan:    Principal problem: Nausea and vomiting, lower abdominal pain / small bowel obstruction - Likely related to small bowel obstruction due to right paracentral hernia as evidenced on CT scan - Appreciate surgery following and their recommendations. - Diet advanced to regular. So far patient tolerates well. Discontinue IV fluids.  Active problems: Urinary tract infection secondary to Klebsiella pneumonia - Patient has Klebsiella pneumonia urinary tract infection based on urine culture results - Continue Rocephin  Acute renal failure superimposed on chronic kidney disease stage III - Baseline creatinine is 1.34 and on this admission 1.63 - Creatinine has normalized with fluids - Please note a renal ultrasound demonstrated a right renal mass likely adrenal lesion unchanged compared to the studies done 2 years prior to this admission. No renal mass identified on CT scan however.  Elevated anion gap metabolic acidosis - Likely  related to acute infection, UTI - Improved with fluids  Dyslipidemia associated with diabetes mellitus type 2 - Resume Lipitor 10 mg at bedtime and fenofibrate. Resume omega-3.   Essential hypertension, benign - Blood pressure stable. She is not on any antihypertensive medications  Left lower extremity cellulitis - Seen by wound care, appreciate their assessment - Patient does have more erythema on left lower extremity. Added doxycycline 01/10/2016  Controlled type 2 diabetes mellitus with peripheral vascular complications with long-term insulin use (HCC) - A1c is 6.4, indicating good glycemic control  - Patient seen by diabetic coordinator and recommendation is for addition of 3 units of NovoLog 3 times daily with meals - Continue sliding scale insulin  - CBGs in past 24 hours: 205, 155, 159. Likely those numbers will improve with addition of 3 units of NovoLog with meals.    DVT Prophylaxis  - Lovenox subcutaneous ordered while pt in hospital   Code Status: Full.  Family Communication:  plan of care discussed with the patient and her husband at the bedside  Disposition Plan: home likely by 01/13/2016  IV access:  Peripheral IV  Procedures and diagnostic studies:    Ct Abdomen Pelvis Wo Contrast 01/06/2016 1. Small bowel obstruction due to a right paracentral hernia containing a loop of small bowel with dilated the efferent component and nondilated efferent component. There is probably some fluid in this hernia sac and strangulation is not excluded. 2. There several left paramedian hernia sacs, 1 containing transverse colon and the lower hernia containing a dilated loop of small bowel. This left-sided hernia is not thought to be the site of obstruction. 3. Bilateral panniculitis left greater than right. 4. Fullness of the right collecting system and right proximal ureter  possibly with slight wall thickening in the right proximal collecting system. Inflammation in this vicinity is not  excluded but I do not see urinary tract calculi 5. Clustered tiny gallstones observed. 6. Poor definition of fat planes along the pancreatic tail, quite likely from chronic scarring although active pancreatitis is not completely excluded. Atrophic pancreatic body and head likely from remote pancreatic injury. 7. Prominent dense calcification of the mitral valve with mild cardiomegaly. 8. Atelectasis in the left lower lobe. 9. Calcification situated between the anterior uterine body in urinary bladder probably a subserosal calcified fibroid. 10. Lower lumbar spondylosis and degenerative disc disease with potential mild impingement at L4-5. 11. Lucencies along the right acetabular roof and posterior acetabulum with surrounding ground-glass matrix bone, query fibrous dysplasia and/or geodes with surrounding reactive findings. Electronically Signed   By: Gaylyn Rong M.D.   On: 01/06/2016 19:20   US Abdomen Complete 01/06/2016  Cholelithiasis. No gallbladder wall thickening or pericholecystic fluid. Fullness of the right renal pelvis and upper pole collecting system. Obstructing lesion not appreciable by ultrasound. This finding may warrant noncontrast enhanced CT to assess for potential ureteral calculus. Probable right renal mass measuring 2.5 x 1.3 x 2.5 cm. CT could also be helpful to further assess this area. Study otherwise unremarkable. Note that portions of pancreas not seen due to overlying gas.   Dg Abd 2 Views 01/08/2016  1. Slight improvement and small bowel distension with persistent left-sided small bowel obstruction. Electronically Signed   By: Marin Roberts M.D.   On: 01/08/2016 08:56   Dg Abd 2 Views 01/07/2016 Findings again consistent with small-bowel obstruction Electronically Signed   By: Esperanza Heir M.D.   On: 01/07/2016 12:17   Dg Abd Acute W/chest 01/06/2016  1. Patchy left lower lobe atelectasis or infiltrate. 2. No acute abdominal findings demonstrated. 3. Right mid and  pelvic calcifications as described. The former could reflect a renal calculus or gallstone. Electronically Signed   By: Carey Bullocks M.D.   On: 01/06/2016 14:56    Medical Consultants:  Surgery  Other Consultants:  PT Nutrition  IAnti-Infectives:   Rocephin 01/06/2016 --> Doxycyline 01/10/2016 -->    Manson Passey, MD  [facility] Pager [phone]  Time spent in minutes: 25 minutes  If 7PM-7AM, please contact night-coverage [email or web address] Password [facility] 01/11/2016, 11:34 AM   LOS: 5 days    HPI/Subjective: No acute overnight events. Patient reports she feels little better.   Objective: Filed Vitals:   01/10/16 0427 01/10/16 1600 01/10/16 2152 01/11/16 0438  BP: 127/72 126/70 128/57 118/56  Pulse: 82 91 95 91  Temp: 98.4 F (36.9 C) 97.9 F (36.6 C) 99.6 F (37.6 C) 98.7 F (37.1 C)  TempSrc: Oral Oral Oral Oral  Resp: 20 19 20 18   Height:      Weight:      SpO2: 100% 100% 98% 95%    Intake/Output Summary (Last 24 hours) at 01/11/16 1134 Last data filed at 01/11/16 0844  Gross per 24 hour  Intake 2607.5 ml  Output      7 ml  Net 2600.5 ml    Exam:   General:  Pt is alert, not in acute distress  Cardiovascular: Rate controlled, S1/S2 appreciated   Respiratory: no wheezing, no crackles, no rhonchi  Abdomen: 2 large abdominal hernias, nontender abd, appreciate bowel sounds  Extremities: Bilateral lower extremity edema, left lower extremity erythematosus but improving since yesterday   Neuro: Nonfocal  Data Reviewed: Basic Metabolic Panel:  Recent Labs  Lab 01/05/16 0855 01/06/16 1448 01/07/16 0355 01/08/16 0530 01/09/16 0425  NA 134* 135 136 141 140  K 4.6 4.4 4.3 4.6 3.9  CL 96* 95* 101 110 107  CO2 21* 24 23 23 23   GLUCOSE 297* 232* 294* 163* 151*  BUN 76* 79* 58* 35* 19  CREATININE 1.63* 1.37* 1.24* 0.84 0.74  CALCIUM 9.6 9.9 9.2 8.9 9.1   Liver Function Tests:  Recent Labs Lab 01/05/16 0855 01/06/16 1448  AST 16 15  ALT  13* 13*  ALKPHOS 52 54  BILITOT 0.9 0.8  PROT 7.6 8.1  ALBUMIN 3.2* 3.5    Recent Labs Lab 01/05/16 0855 01/06/16 1448  LIPASE 21 21   No results for input(s): AMMONIA in the last 168 hours. CBC:  Recent Labs Lab 01/05/16 0855 01/06/16 1448 01/08/16 0530 01/09/16 0425  WBC 6.4 5.6 5.3 6.0  HGB 11.5* 11.7* 9.8* 9.8*  HCT 35.7* 37.2 30.2* 31.5*  MCV 91.3 91.9 92.1 93.2  PLT 336 327 247 266   Cardiac Enzymes:  Recent Labs Lab 01/06/16 1448  TROPONINI 0.05*   BNP: Invalid input(s): POCBNP CBG:  Recent Labs Lab 01/10/16 1612 01/10/16 2000 01/10/16 2314 01/11/16 0421 01/11/16 0753  GLUCAP 251* 188* 205* 155* 159*    Urine culture     Status: None   Collection Time: 01/05/16  1:30 PM  Result Value Ref Range Status   Specimen Description URINE, CLEAN CATCH  Final   Special Requests none Normal  Final   Culture   Final   Report Status 01/07/2016 FINAL  Final   Organism ID, Bacteria KLEBSIELLA PNEUMONIAE  Final      Susceptibility   Klebsiella pneumoniae - MIC*    AMPICILLIN >=32 RESISTANT Resistant     CEFAZOLIN <=4 SENSITIVE Sensitive     CEFTRIAXONE <=1 SENSITIVE Sensitive     CIPROFLOXACIN <=0.25 SENSITIVE Sensitive     GENTAMICIN <=1 SENSITIVE Sensitive     IMIPENEM <=0.25 SENSITIVE Sensitive     NITROFURANTOIN 64 INTERMEDIATE Intermediate     TRIMETH/SULFA <=20 SENSITIVE Sensitive     AMPICILLIN/SULBACTAM 8 SENSITIVE Sensitive     PIP/TAZO <=4 SENSITIVE Sensitive     * >=100,000 COLONIES/mL KLEBSIELLA PNEUMONIAE  Blood culture (routine x 2)     Status: None (Preliminary result)   Collection Time: 01/06/16  3:13 PM  Result Value Ref Range Status   Specimen Description BLOOD LEFT ANTECUBITAL  Final   Special Requests BOTTLES DRAWN AEROBIC AND ANAEROBIC  Final   Culture   Final    NO GROWTH 3 DAYS Performed at [facility]    Report Status PENDING  Incomplete  Blood culture (routine x 2)     Status: None (Preliminary result)    Collection Time: 01/06/16  3:14 PM  Result Value Ref Range Status   Specimen Description BLOOD RIGHT FOREARM  Final   Special Requests BOTTLES DRAWN AEROBIC ONLY  Final   Culture   Final    NO GROWTH 3 DAYS Performed at [facility]    Report Status PENDING  Incomplete     Scheduled Meds: . cefTRIAXone (ROCEPHIN)  IV  1 g Intravenous Q24H  . darifenacin  15 mg Oral Daily  . doxycycline  100 mg Oral Q12H  . enoxaparin (LOVENOX) injection  40 mg Subcutaneous Q24H  . fluticasone  1 spray Each Nare Daily  . hydrocerin   Topical BID  . insulin aspart  0-20 Units Subcutaneous 6 times per day  .  insulin aspart  3 Units Subcutaneous TID WC  . mirabegron ER  50 mg Oral Daily  . nystatin  5 mL Oral QID  . omega-3 acid ethyl esters  1 g Oral Daily  . pantoprazole (PROTONIX) IV  40 mg Intravenous Q24H  . sodium chloride  3 mL Intravenous Q12H   Continuous Infusions: . sodium chloride 75 mL/hr at 01/10/16 1426

## 2016-01-11 NOTE — Progress Notes (Signed)
  Subjective: No complaints. Tolerating diet  Objective: Vital signs in last 24 hours: Temp:  [97.9 F (36.6 C)-99.6 F (37.6 C)] 98.7 F (37.1 C) (01/14 0438) Pulse Rate:  [91-95] 91 (01/14 0438) Resp:  [18-20] 18 (01/14 0438) BP: (118-128)/(56-70) 118/56 mmHg (01/14 0438) SpO2:  [95 %-100 %] 95 % (01/14 0438) Last BM Date: 01/10/16  Intake/Output from previous day: 01/13 0701 - 01/14 0700 In: 2487.5 [P.O.:720; I.V.:1717.5; IV Piggyback:50] Out: 8 [Urine:7; Stool:1] Intake/Output this shift: Total I/O In: 240 [P.O.:240] Out: 1 [Urine:1]  Resp: clear to auscultation bilaterally Cardio: regular rate and rhythm GI: soft, mild tenderness. complex ventral hernia  Lab Results:   Recent Labs  01/09/16 0425  WBC 6.0  HGB 9.8*  HCT 31.5*  PLT 266   BMET  Recent Labs  01/09/16 0425  NA 140  K 3.9  CL 107  CO2 23  GLUCOSE 151*  BUN 19  CREATININE 0.74  CALCIUM 9.1   PT/INR No results for input(s): LABPROT, INR in the last 72 hours. ABG No results for input(s): PHART, HCO3 in the last 72 hours.  Invalid input(s): PCO2, PO2  Studies/Results: No results found.  Anti-infectives: Anti-infectives    Start     Dose/Rate Route Frequency Ordered Stop   01/10/16 1300  doxycycline (VIBRA-TABS) tablet 100 mg     100 mg Oral Every 12 hours 01/10/16 1233     01/07/16 1600  cefTRIAXone (ROCEPHIN) 1 g in dextrose 5 % 50 mL IVPB     1 g 100 mL/hr over 30 Minutes Intravenous Every 24 hours 01/06/16 2125     01/06/16 1645  cefTRIAXone (ROCEPHIN) 1 g in dextrose 5 % 50 mL IVPB     1 g 100 mL/hr over 30 Minutes Intravenous  Once 01/06/16 1638 01/06/16 1733   01/06/16 1645  azithromycin (ZITHROMAX) 500 mg in dextrose 5 % 250 mL IVPB     500 mg 250 mL/hr over 60 Minutes Intravenous  Once 01/06/16 1638 01/06/16 1946      Assessment/Plan: s/p * No surgery found * Advance diet Follow up with Dr. Derrell Lolling for elective repair or [facility]  LOS: 5 days     TOTH III,PAUL S 01/11/2016

## 2016-01-12 LAB — BASIC METABOLIC PANEL WITH GFR
Anion gap: 13 (ref 5–15)
BUN: 12 mg/dL (ref 6–20)
CO2: 23 mmol/L (ref 22–32)
Calcium: 8.8 mg/dL — ABNORMAL LOW (ref 8.9–10.3)
Chloride: 101 mmol/L (ref 101–111)
Creatinine, Ser: 0.69 mg/dL (ref 0.44–1.00)
GFR calc Af Amer: >60 mL/min
GFR calc non Af Amer: >60 mL/min
Glucose, Bld: 271 mg/dL — ABNORMAL HIGH (ref 65–99)
Potassium: 4.2 mmol/L (ref 3.5–5.1)
Sodium: 137 mmol/L (ref 135–145)

## 2016-01-12 LAB — CBC
HCT: 30.3 % — ABNORMAL LOW (ref 36.0–46.0)
Hemoglobin: 9.5 g/dL — ABNORMAL LOW (ref 12.0–15.0)
MCH: 29.1 pg (ref 26.0–34.0)
MCHC: 31.4 g/dL (ref 30.0–36.0)
MCV: 92.9 fL (ref 78.0–100.0)
Platelets: 232 K/uL (ref 150–400)
RBC: 3.26 MIL/uL — ABNORMAL LOW (ref 3.87–5.11)
RDW: 14.9 % (ref 11.5–15.5)
WBC: 5.8 K/uL (ref 4.0–10.5)

## 2016-01-12 LAB — GLUCOSE, CAPILLARY
Glucose-Capillary: 228 mg/dL — ABNORMAL HIGH (ref 65–99)
Glucose-Capillary: 236 mg/dL — ABNORMAL HIGH (ref 65–99)
Glucose-Capillary: 200 mg/dL — ABNORMAL HIGH (ref 65–99)
Glucose-Capillary: 202 mg/dL — ABNORMAL HIGH (ref 65–99)

## 2016-01-12 MED ORDER — INSULIN ASPART 100 UNIT/ML ~~LOC~~ SOLN
7.0000 [IU] | Freq: Three times a day (TID) | SUBCUTANEOUS | Status: DC
  Administered 2016-01-12 – 2016-01-13 (×3): 7 [IU] via SUBCUTANEOUS

## 2016-01-12 NOTE — Progress Notes (Addendum)
Patient ID: Vanessa DurhamBeverly Boyan, female   DOB: January 25, 1946, 70 y.o.   MRN: [id number] [facility] PROGRESS NOTE  Vanessa DurhamBeverly Zimmers [id number] DOB: January 25, 1946 DOA: 01/06/2016 PCP: Garth SchlatterAMEEN, WILLIAM OTIS, MD  Brief narrative:    70 y.o. female with past medical history significant for overactive bladder, chronic UTIs, necrotizing pancreatitis status post partial pancreatectomy, morbid obesity, type 2 diabetes mellitus who presented to [facility] with nausea, vomiting and diffuse abdominal pain for 3 days prior to this admission. Patient reported diffuse pain but mostly located in lower abdomen radiating to the lower back. She vomited few times prior to this admission. She also reported poor by mouth intake. No associated fevers or chills. No diarrhea.  She was hemodynamically stable on the admission. She was found to have UTI in addition to small bowel obstruction. Surgery has seen her in consultation with recommendation for conservative management.   Assessment/Plan:    Principal problem: Nausea and vomiting, lower abdominal pain / small bowel obstruction - Likely related to small bowel obstruction due to right paracentral hernia as evidenced on CT scan - From surgery standpoint patient stable for discharge when medically cleared - Monitor for next 24 hours if appetite improves. She is on regular diet.  Active problems: Urinary tract infection secondary to Klebsiella pneumonia - Patient has Klebsiella pneumonia urinary tract infection based on urine culture results - Continue Rocephin while patient in hospital and switched to by mouth antibiotics on discharge.  Acute renal failure superimposed on chronic kidney disease stage III - Baseline creatinine is 1.34 and on this admission 1.63 - Please note a renal ultrasound demonstrated a right renal mass likely adrenal lesion unchanged compared to the studies done 2 years prior to this admission. No renal mass identified on CT scan however. -   Creatinine improved with fluids   Elevated anion gap metabolic acidosis - Likely related to acute infection, UTI - Improved with fluids  Dyslipidemia associated with diabetes mellitus type 2 - Continue Lipitor 10 mg at bedtime, omega 3 and fenofibrate.   Essential hypertension, benign - Blood pressure stable without BP meds  Left lower extremity cellulitis - Seen by wound care, appreciate their assessment - Cellulitis improving with doxycycline  Controlled type 2 diabetes mellitus with peripheral vascular complications with long-term insulin use (HCC) - A1c is 6.4, indicating good glycemic control  - CBGs in past 24 hours: 225, 208, 245 - Patient is on a NovoLog 3 units 3 times daily with meals in addition to sliding scale insulin.  Increase NovoLog to 7 units 3 times daily with meals.  Morbid obesity due to excess calorie - Body mass index is 47.81 kg/(m^2). - Counseled on diet, nutrition    DVT Prophylaxis  - Lovenox subQ  Code Status: Full.  Family Communication:  plan of care discussed with the patient and her husband at the bedside  Disposition Plan: home likely by 01/13/2016  IV access:  Peripheral IV  Procedures and diagnostic studies:    Ct Abdomen Pelvis Wo Contrast 01/06/2016 1. Small bowel obstruction due to a right paracentral hernia containing a loop of small bowel with dilated the efferent component and nondilated efferent component. There is probably some fluid in this hernia sac and strangulation is not excluded. 2. There several left paramedian hernia sacs, 1 containing transverse colon and the lower hernia containing a dilated loop of small bowel. This left-sided hernia is not thought to be the site of obstruction. 3. Bilateral panniculitis left greater than right. 4. Fullness of  the right collecting system and right proximal ureter possibly with slight wall thickening in the right proximal collecting system. Inflammation in this vicinity is not excluded but I do  not see urinary tract calculi 5. Clustered tiny gallstones observed. 6. Poor definition of fat planes along the pancreatic tail, quite likely from chronic scarring although active pancreatitis is not completely excluded. Atrophic pancreatic body and head likely from remote pancreatic injury. 7. Prominent dense calcification of the mitral valve with mild cardiomegaly. 8. Atelectasis in the left lower lobe. 9. Calcification situated between the anterior uterine body in urinary bladder probably a subserosal calcified fibroid. 10. Lower lumbar spondylosis and degenerative disc disease with potential mild impingement at L4-5. 11. Lucencies along the right acetabular roof and posterior acetabulum with surrounding ground-glass matrix bone, query fibrous dysplasia and/or geodes with surrounding reactive findings. Electronically Signed   By: Gaylyn Rong M.D.   On: 01/06/2016 19:20   US Abdomen Complete 01/06/2016  Cholelithiasis. No gallbladder wall thickening or pericholecystic fluid. Fullness of the right renal pelvis and upper pole collecting system. Obstructing lesion not appreciable by ultrasound. This finding may warrant noncontrast enhanced CT to assess for potential ureteral calculus. Probable right renal mass measuring 2.5 x 1.3 x 2.5 cm. CT could also be helpful to further assess this area. Study otherwise unremarkable. Note that portions of pancreas not seen due to overlying gas.   Dg Abd 2 Views 01/08/2016  1. Slight improvement and small bowel distension with persistent left-sided small bowel obstruction. Electronically Signed   By: Marin Roberts M.D.   On: 01/08/2016 08:56   Dg Abd 2 Views 01/07/2016 Findings again consistent with small-bowel obstruction Electronically Signed   By: Esperanza Heir M.D.   On: 01/07/2016 12:17   Dg Abd Acute W/chest 01/06/2016  1. Patchy left lower lobe atelectasis or infiltrate. 2. No acute abdominal findings demonstrated. 3. Right mid and pelvic calcifications  as described. The former could reflect a renal calculus or gallstone. Electronically Signed   By: Carey Bullocks M.D.   On: 01/06/2016 14:56    Medical Consultants:  Surgery  Other Consultants:  PT Nutrition  IAnti-Infectives:   Rocephin 01/06/2016 --> Doxycyline 01/10/2016 -->    Manson Passey, MD  [facility] Pager [phone]  Time spent in minutes: 25 minutes  If 7PM-7AM, please contact night-coverage [email or web address] Password [email or web address] 01/12/2016, 11:45 AM   LOS: 6 days    HPI/Subjective: No acute overnight events. Patient reports pain controlled. Appetite is slowly improving.   Objective: Filed Vitals:   01/11/16 0438 01/11/16 1344 01/11/16 2009 01/12/16 0527  BP: 118/56 147/69 145/70 127/62  Pulse: 91 96 96 91  Temp: 98.7 F (37.1 C) 97.6 F (36.4 C) 98.5 F (36.9 C) 97.9 F (36.6 C)  TempSrc: Oral Oral Oral Oral  Resp: 18 18  20   Height:      Weight:      SpO2: 95% 99% 98% 96%    Intake/Output Summary (Last 24 hours) at 01/12/16 1145 Last data filed at 01/12/16 0500  Gross per 24 hour  Intake 1396.5 ml  Output      2 ml  Net 1394.5 ml    Exam:   General:  Pt is not in acute distress  Cardiovascular: RRR, S1/S2 (+)  Respiratory: Bilateral air entry, no wheezing  Abdomen: 2 large abdominal hernias, bowel sounds appreciated  Extremities: Bilateral lower extremity edema, left lower extremity erythema improving  Neuro: No focal deficit  Data Reviewed: Basic Metabolic Panel:  Recent Labs Lab 01/06/16 1448 01/07/16 0355 01/08/16 0530 01/09/16 0425 01/12/16 0532  NA 135 136 141 140 137  K 4.4 4.3 4.6 3.9 4.2  CL 95* 101 110 107 101  CO2 24 23 23 23 23   GLUCOSE 232* 294* 163* 151* 271*  BUN 79* 58* 35* 19 12  CREATININE 1.37* 1.24* 0.84 0.74 0.69  CALCIUM 9.9 9.2 8.9 9.1 8.8*   Liver Function Tests:  Recent Labs Lab 01/06/16 1448  AST 15  ALT 13*  ALKPHOS 54  BILITOT 0.8  PROT 8.1  ALBUMIN 3.5    Recent Labs Lab  01/06/16 1448  LIPASE 21   No results for input(s): AMMONIA in the last 168 hours. CBC:  Recent Labs Lab 01/06/16 1448 01/08/16 0530 01/09/16 0425 01/12/16 0532  WBC 5.6 5.3 6.0 5.8  HGB 11.7* 9.8* 9.8* 9.5*  HCT 37.2 30.2* 31.5* 30.3*  MCV 91.9 92.1 93.2 92.9  PLT 327 247 266 232   Cardiac Enzymes:  Recent Labs Lab 01/06/16 1448  TROPONINI 0.05*   BNP: Invalid input(s): POCBNP CBG:  Recent Labs Lab 01/11/16 0421 01/11/16 0753 01/11/16 1202 01/11/16 1642 01/11/16 2006  GLUCAP 155* 159* 225* 208* 245*    Urine culture     Status: None   Collection Time: 01/05/16  1:30 PM  Result Value Ref Range Status   Specimen Description URINE, CLEAN CATCH  Final   Special Requests none Normal  Final   Culture   Final   Report Status 01/07/2016 FINAL  Final   Organism ID, Bacteria KLEBSIELLA PNEUMONIAE  Final      Susceptibility   Klebsiella pneumoniae - MIC*    AMPICILLIN >=32 RESISTANT Resistant     CEFAZOLIN <=4 SENSITIVE Sensitive     CEFTRIAXONE <=1 SENSITIVE Sensitive     CIPROFLOXACIN <=0.25 SENSITIVE Sensitive     GENTAMICIN <=1 SENSITIVE Sensitive     IMIPENEM <=0.25 SENSITIVE Sensitive     NITROFURANTOIN 64 INTERMEDIATE Intermediate     TRIMETH/SULFA <=20 SENSITIVE Sensitive     AMPICILLIN/SULBACTAM 8 SENSITIVE Sensitive     PIP/TAZO <=4 SENSITIVE Sensitive     * >=100,000 COLONIES/mL KLEBSIELLA PNEUMONIAE  Blood culture (routine x 2)     Status: None (Preliminary result)   Collection Time: 01/06/16  3:13 PM  Result Value Ref Range Status   Specimen Description BLOOD LEFT ANTECUBITAL  Final   Special Requests BOTTLES DRAWN AEROBIC AND ANAEROBIC  Final   Culture   Final    NO GROWTH 3 DAYS Performed at [facility]    Report Status PENDING  Incomplete  Blood culture (routine x 2)     Status: None (Preliminary result)   Collection Time: 01/06/16  3:14 PM  Result Value Ref Range Status   Specimen Description BLOOD RIGHT FOREARM  Final    Special Requests BOTTLES DRAWN AEROBIC ONLY  Final   Culture   Final    NO GROWTH 3 DAYS Performed at [facility]    Report Status PENDING  Incomplete     Scheduled Meds: . amLODipine  10 mg Oral Daily  . atorvastatin  10 mg Oral Daily  . cefTRIAXone (ROCEPHIN)  IV  1 g Intravenous Q24H  . darifenacin  15 mg Oral Daily  . doxycycline  100 mg Oral Q12H  . enoxaparin (LOVENOX) injection  40 mg Subcutaneous Q24H  . fenofibrate  160 mg Oral Daily  . fluticasone  1 spray Each Nare Daily  . hydrocerin  Topical BID  . insulin aspart  0-20 Units Subcutaneous TID WC  . insulin aspart  0-5 Units Subcutaneous QHS  . insulin aspart  3 Units Subcutaneous TID WC  . mirabegron ER  50 mg Oral Daily  . nystatin  5 mL Oral QID  . omega-3 acid ethyl esters  1 g Oral Daily  . pantoprazole (PROTONIX) IV  40 mg Intravenous Q24H  . sodium chloride  3 mL Intravenous Q12H   Continuous Infusions: . sodium chloride 10 mL/hr at 01/11/16 1236

## 2016-01-12 NOTE — Progress Notes (Signed)
  Subjective: No complaints. Tolerating diet  Objective: Vital signs in last 24 hours: Temp:  [97.6 F (36.4 C)-98.5 F (36.9 C)] 97.9 F (36.6 C) (01/15 0527) Pulse Rate:  [91-96] 91 (01/15 0527) Resp:  [18-20] 20 (01/15 0527) BP: (127-147)/(62-70) 127/62 mmHg (01/15 0527) SpO2:  [96 %-99 %] 96 % (01/15 0527) Last BM Date: 01/10/16  Intake/Output from previous day: 01/14 0701 - 01/15 0700 In: 1636.5 [P.O.:920; I.V.:666.5; IV Piggyback:50] Out: 5 [Urine:4; Stool:1] Intake/Output this shift:    Resp: clear to auscultation bilaterally Cardio: regular rate and rhythm GI: soft, complex hernia. no obstruction  Lab Results:   Recent Labs  01/12/16 0532  WBC 5.8  HGB 9.5*  HCT 30.3*  PLT 232   BMET  Recent Labs  01/12/16 0532  NA 137  K 4.2  CL 101  CO2 23  GLUCOSE 271*  BUN 12  CREATININE 0.69  CALCIUM 8.8*   PT/INR No results for input(s): LABPROT, INR in the last 72 hours. ABG No results for input(s): PHART, HCO3 in the last 72 hours.  Invalid input(s): PCO2, PO2  Studies/Results: No results found.  Anti-infectives: Anti-infectives    Start     Dose/Rate Route Frequency Ordered Stop   01/10/16 1300  doxycycline (VIBRA-TABS) tablet 100 mg     100 mg Oral Every 12 hours 01/10/16 1233     01/07/16 1600  cefTRIAXone (ROCEPHIN) 1 g in dextrose 5 % 50 mL IVPB     1 g 100 mL/hr over 30 Minutes Intravenous Every 24 hours 01/06/16 2125     01/06/16 1645  cefTRIAXone (ROCEPHIN) 1 g in dextrose 5 % 50 mL IVPB     1 g 100 mL/hr over 30 Minutes Intravenous  Once 01/06/16 1638 01/06/16 1733   01/06/16 1645  azithromycin (ZITHROMAX) 500 mg in dextrose 5 % 250 mL IVPB     500 mg 250 mL/hr over 60 Minutes Intravenous  Once 01/06/16 1638 01/06/16 1946      Assessment/Plan: s/p * No surgery found * Advance diet  Ok for discharge when medically ready  LOS: 6 days    TOTH III,PAUL S 01/12/2016

## 2016-01-13 LAB — CBC
HCT: 31.3 % — ABNORMAL LOW (ref 36.0–46.0)
Hemoglobin: 9.9 g/dL — ABNORMAL LOW (ref 12.0–15.0)
MCH: 29.1 pg (ref 26.0–34.0)
MCHC: 31.6 g/dL (ref 30.0–36.0)
MCV: 92.1 fL (ref 78.0–100.0)
PLATELETS: 251 10*3/uL (ref 150–400)
RBC: 3.4 MIL/uL — AB (ref 3.87–5.11)
RDW: 14.8 % (ref 11.5–15.5)
WBC: 7 10*3/uL (ref 4.0–10.5)

## 2016-01-13 LAB — BASIC METABOLIC PANEL
ANION GAP: 12 (ref 5–15)
BUN: 16 mg/dL (ref 6–20)
CALCIUM: 9.1 mg/dL (ref 8.9–10.3)
CO2: 23 mmol/L (ref 22–32)
Chloride: 103 mmol/L (ref 101–111)
Creatinine, Ser: 0.73 mg/dL (ref 0.44–1.00)
GFR calc Af Amer: >60 mL/min (ref >60)
GLUCOSE: 229 mg/dL — AB (ref 65–99)
Potassium: 4.1 mmol/L (ref 3.5–5.1)
SODIUM: 138 mmol/L (ref 135–145)

## 2016-01-13 LAB — GLUCOSE, CAPILLARY: GLUCOSE-CAPILLARY: 206 mg/dL — AB (ref 65–99)

## 2016-01-13 MED ORDER — HYDROCERIN EX CREA: 1.0000 "application " | TOPICAL_CREAM | Freq: Two times a day (BID) | CUTANEOUS | Status: DC

## 2016-01-13 MED ORDER — DOXYCYCLINE HYCLATE 100 MG PO TABS
100.0000 mg | ORAL_TABLET | Freq: Two times a day (BID) | ORAL | Status: DC
Start: 2016-01-13 — End: 2016-01-30

## 2016-01-13 MED ORDER — CIPROFLOXACIN HCL 500 MG PO TABS: 500.0000 mg | ORAL_TABLET | Freq: Two times a day (BID) | ORAL | Status: DC

## 2016-01-13 NOTE — Care Management Important Message (Signed)
Important Message  Patient Details  Name: Valerie [name] MRN: [id number] Date of Birth: 1946/05/10   Medicare Important Message Given:  Yes    Haskell FlirtJamison, Clorinda Wyble 01/13/2016, 10:43 AMImportant Message  Patient Details  Name: Valerie [name] MRN: [id number] Date of Birth: 1946/05/10   Medicare Important Message Given:  Yes    Haskell FlirtJamison, Daily Crate 01/13/2016, 10:43 AM

## 2016-01-13 NOTE — Progress Notes (Signed)
Inpatient Diabetes Program Recommendations  AACE/ADA: New Consensus Statement on Inpatient Glycemic Control (2015)  Target Ranges:  Prepandial:   less than 140 mg/dL      Peak postprandial:   less than 180 mg/dL (1-2 hours)      Critically ill patients:  140 - 180 mg/dL    Results for Vanessa DurhamWYATT, Chrys (MRN [id number]) as of 01/13/2016 09:42  Ref. Range 01/12/2016 07:50 01/12/2016 12:06 01/12/2016 16:37 01/12/2016 21:49  Glucose-Capillary Latest Ref Range: 65-99 mg/dL 409228 (H) 811236 (H) 914200 (H) 202 (H)    Results for Vanessa DurhamWYATT, Sabeen (MRN [id number]) as of 01/13/2016 09:42  Ref. Range 01/13/2016 07:47  Glucose-Capillary Latest Ref Range: 65-99 mg/dL 086206 (H)    Home DM Meds: U-500 concentrated insulin TID AC (20 units CBG >150, 30 units CBG >200, 40 units CBG >300)  Current Insulin Orders: Novolog Resistant SSI (0-20 units) TID AC + HS      Novolog 7 units tidwc     MD- Note Novolog Meal Coverage increased to 7 units tidwc yesterday at 12pm.  Patient still having elevated fasting glucose levels.  If patient not discharged home today, please consider starting basal insulin while patient off her home U-500 insulin regimen-  Lantus 12 units daily (0.1 units/kg dosing)     --Will follow patient during hospitalization--  Ambrose FinlandJeannine Johnston Kitty Cadavid RN, MSN, CDE Diabetes Coordinator Inpatient Glycemic Control Team Team Pager: [phone] (8a-5p)

## 2016-01-13 NOTE — Discharge Summary (Signed)
Physician Discharge Summary  Valerie [name] JXB:[id number] DOB: [birth year] DOA: 01/06/2016  PCP: Garth Schlatter, MD  Admit date: 01/06/2016 Discharge date: 01/13/2016  Recommendations for Outpatient Follow-up:  1. Continue doxycycline for 10 days on discharge for treatment of cellulitis 2. Continue ciprofloxacin twice daily for 3 days on discharge for Klebsiella UTI 3. You may resume insulin regimen per home regimen  Discharge Diagnoses:  Principal Problem:   SBO (small bowel obstruction) (HCC) Active Problems:   Hyperlipidemia   Essential hypertension, benign   Persistent vomiting   Nausea and vomiting   Uncontrolled type 2 diabetes mellitus (HCC)   Acute kidney injury (HCC)   Morbid obesity (HCC)   Thrush   Abdominal pain   Right renal mass    Discharge Condition: stable   Diet recommendation: as tolerated   History of present illness:  70 y.o. female with past medical history significant for overactive bladder, chronic UTIs, necrotizing pancreatitis status post partial pancreatectomy, morbid obesity, type 2 diabetes mellitus who presented to [facility] long hospital with nausea, vomiting and diffuse abdominal pain for 3 days prior to this admission. Patient reported diffuse pain but mostly located in lower abdomen radiating to the lower back. She vomited few times prior to this admission. She also reported poor by mouth intake. No associated fevers or chills. No diarrhea.  She was hemodynamically stable on the admission. She was found to have UTI in addition to small bowel obstruction. Surgery has seen her in consultation with recommendation for conservative management.  Hospital Course:   Assessment/Plan:    Principal problem: Nausea and vomiting, lower abdominal pain / small bowel obstruction - Likely related to small bowel obstruction due to right paracentral hernia as evidenced on CT scan - From surgery standpoint patient stable for discharge when medically  cleared - Tolerates regular diet  Active problems: Urinary tract infection secondary to Klebsiella pneumonia - Patient has Klebsiella pneumonia urinary tract infection based on urine culture results - She was on Rocephin through today and then she will continue Cipro for 3 more days on discharge  Acute renal failure superimposed on chronic kidney disease stage III - Baseline creatinine is 1.34 and on this admission 1.63 - Please note a renal ultrasound demonstrated a right renal mass likely adrenal lesion unchanged compared to the studies done 2 years prior to this admission. No renal mass identified on CT scan however. - Creatinine improved with fluids   Elevated anion gap metabolic acidosis - Likely related to acute infection, UTI - Improved with fluids  Dyslipidemia associated with diabetes mellitus type 2 - Continue Lipitor 10 mg at bedtime, omega 3 and fenofibrate.   Essential hypertension, benign - She can resume her Norvasc on discharge  Left lower extremity cellulitis - Seen by wound care, appreciate their assessment - Cellulitis improving with doxycycline - She will continue doxycycline for 10 days on discharge  Controlled type 2 diabetes mellitus with peripheral vascular complications with long-term insulin use (HCC) - A1c is 6.4, indicating good glycemic control  - Patient may resume insulin regimen per home regimen as she prefers  Morbid obesity due to excess calorie - Body mass index is 47.81 kg/(m^2). - Counseled on diet, nutrition    DVT Prophylaxis  - Lovenox subQ in hospital   Code Status: Full.  Family Communication: plan of care discussed with the patient and her husband at the bedside    IV access:  Peripheral IV  Procedures and diagnostic studies:   Ct Abdomen Pelvis Wo Contrast  01/06/2016 1. Small bowel obstruction due to a right paracentral hernia containing a loop of small bowel with dilated the efferent component and nondilated  efferent component. There is probably some fluid in this hernia sac and strangulation is not excluded. 2. There several left paramedian hernia sacs, 1 containing transverse colon and the lower hernia containing a dilated loop of small bowel. This left-sided hernia is not thought to be the site of obstruction. 3. Bilateral panniculitis left greater than right. 4. Fullness of the right collecting system and right proximal ureter possibly with slight wall thickening in the right proximal collecting system. Inflammation in this vicinity is not excluded but I do not see urinary tract calculi 5. Clustered tiny gallstones observed. 6. Poor definition of fat planes along the pancreatic tail, quite likely from chronic scarring although active pancreatitis is not completely excluded. Atrophic pancreatic body and head likely from remote pancreatic injury. 7. Prominent dense calcification of the mitral valve with mild cardiomegaly. 8. Atelectasis in the left lower lobe. 9. Calcification situated between the anterior uterine body in urinary bladder probably a subserosal calcified fibroid. 10. Lower lumbar spondylosis and degenerative disc disease with potential mild impingement at L4-5. 11. Lucencies along the right acetabular roof and posterior acetabulum with surrounding ground-glass matrix bone, query fibrous dysplasia and/or geodes with surrounding reactive findings. Electronically Signed By: Gaylyn Rong M.D. On: 01/06/2016 19:20   US Abdomen Complete 01/06/2016 Cholelithiasis. No gallbladder wall thickening or pericholecystic fluid. Fullness of the right renal pelvis and upper pole collecting system. Obstructing lesion not appreciable by ultrasound. This finding may warrant noncontrast enhanced CT to assess for potential ureteral calculus. Probable right renal mass measuring 2.5 x 1.3 x 2.5 cm. CT could also be helpful to further assess this area. Study otherwise unremarkable. Note that portions of pancreas not  seen due to overlying gas.   Dg Abd 2 Views 01/08/2016 1. Slight improvement and small bowel distension with persistent left-sided small bowel obstruction. Electronically Signed By: Marin Roberts M.D. On: 01/08/2016 08:56   Dg Abd 2 Views 01/07/2016 Findings again consistent with small-bowel obstruction Electronically Signed By: Esperanza Heir M.D. On: 01/07/2016 12:17   Dg Abd Acute W/chest 01/06/2016 1. Patchy left lower lobe atelectasis or infiltrate. 2. No acute abdominal findings demonstrated. 3. Right mid and pelvic calcifications as described. The former could reflect a renal calculus or gallstone. Electronically Signed By: Carey Bullocks M.D. On: 01/06/2016 14:56    Medical Consultants:  Surgery  Other Consultants:  PT Nutrition  IAnti-Infectives:   Rocephin 01/06/2016 --> 01/13/2016  Doxycyline 01/10/2016 --> for 10 days on discharge  Signed:  Manson Passey, MD  [facility] 01/13/2016, 10:04 AM  Pager #: [phone]  Time spent in minutes: more than 30 minutes   Discharge Exam: Filed Vitals:   01/12/16 2048 01/13/16 0418  BP: 118/56 131/53  Pulse: 91 88  Temp: 98.4 F (36.9 C) 98.4 F (36.9 C)  Resp: 20 18   Filed Vitals:   01/12/16 0527 01/12/16 1439 01/12/16 2048 01/13/16 0418  BP: 127/62 128/60 118/56 131/53  Pulse: 91 88 91 88  Temp: 97.9 F (36.6 C) 98.6 F (37 C) 98.4 F (36.9 C) 98.4 F (36.9 C)  TempSrc: Oral Oral Oral Oral  Resp: 20 20 20 18   Height:      Weight:      SpO2: 96% 99% 100% 95%    General: Pt is alert, follows commands appropriately, not in acute distress Cardiovascular: Regular rate and rhythm, S1/S2 + Respiratory:  Clear to auscultation bilaterally, no wheezing, no crackles, no rhonchi Abdominal: 2 abdominal wall hernias, nontender abdomen, obese Extremities: Cellulitis on left lower extremity improving, pulses palpable Neuro: Grossly nonfocal  Discharge Instructions  Discharge Instructions     Call MD for:  difficulty breathing, headache or visual disturbances    Complete by:  As directed      Call MD for:  persistant dizziness or light-headedness    Complete by:  As directed      Call MD for:  persistant nausea and vomiting    Complete by:  As directed      Call MD for:  severe uncontrolled pain    Complete by:  As directed      Diet - low sodium heart healthy    Complete by:  As directed      Discharge instructions    Complete by:  As directed   1. Continue doxycycline for 10 days on discharge for treatment of cellulitis 2. Continue ciprofloxacin twice daily for 3 days on discharge for Klebsiella UTI 3. You may resume insulin regimen per home regimen     Increase activity slowly    Complete by:  As directed             Medication List    STOP taking these medications        hydrochlorothiazide 12.5 MG capsule  Commonly known as:  MICROZIDE     metFORMIN 500 MG 24 hr tablet  Commonly known as:  GLUCOPHAGE-XR     nitrofurantoin 50 MG capsule  Commonly known as:  MACRODANTIN      TAKE these medications        ALLEGRA PO  Take 1 tablet by mouth daily as needed (allergies).     amLODipine 10 MG tablet  Commonly known as:  NORVASC  TAKE 1 TABLET BY MOUTH EVERY DAY     atorvastatin 10 MG tablet  Commonly known as:  LIPITOR  TAKE 1 TABLET BY MOUTH DAILY     ciprofloxacin 500 MG tablet  Commonly known as:  CIPRO  Take 1 tablet (500 mg total) by mouth 2 (two) times daily.     CRANBERRY PO  Take 1 tablet by mouth 2 (two) times daily.     doxycycline 100 MG tablet  Commonly known as:  VIBRA-TABS  Take 1 tablet (100 mg total) by mouth every 12 (twelve) hours.     fenofibrate 160 MG tablet  TAKE 1 TABLET BY MOUTH DAILY     Fish Oil 1000 MG Caps  Take 1 capsule by mouth daily.     glucose blood test strip  Commonly known as:  ONE TOUCH ULTRA TEST  TEST EIGHT TIMES DAILY     hydrocerin Crea  Apply 1 application topically 2 (two) times daily.      Insulin Pen Needle 32G X 5 MM Misc  Commonly known as:  NOVOTWIST  Use to inject victoza daily     insulin regular human CONCENTRATED 500 UNIT/ML injection  Commonly known as:  HUMULIN R  INJECT 44 UNITS AS MARKED ON THE INSULIN SYRINGE UNDER THE SKIN THREE TIMES DAILY     mometasone 50 MCG/ACT nasal spray  Commonly known as:  NASONEX  Place 2 sprays into the nose daily as needed (allergies).     MYRBETRIQ 50 MG Tb24 tablet  Generic drug:  mirabegron ER  Take 50 mg by mouth daily.     naproxen sodium 220 MG tablet  Commonly known  as:  ANAPROX  Take 440 mg by mouth 2 (two) times daily as needed (pain).     omeprazole 20 MG capsule  Commonly known as:  PRILOSEC  Take 20 mg by mouth daily.     ondansetron 4 MG disintegrating tablet  Commonly known as:  ZOFRAN ODT  Take 1 tablet (4 mg total) by mouth every 8 (eight) hours as needed for nausea.     ONE TOUCH LANCETS Misc  Use to check blood sugars 8 times daily     solifenacin 10 MG tablet  Commonly known as:  VESICARE  Take 10 mg by mouth daily.     trimethoprim 100 MG tablet  Commonly known as:  TRIMPEX  Take 100 mg by mouth daily.     TYLENOL EX ST ARTHRITIS PAIN 500 MG tablet  Generic drug:  acetaminophen  Take 500 mg by mouth every 6 (six) hours as needed for mild pain or moderate pain.           Follow-up Information    Follow up with Ivory Broad, Joylene John, MD In 1 week.   Specialty:  Family Medicine   Contact information:   [facility] [address] [place] [address] [phone]       Follow up with Anner Crete, MD.   Specialty:  Urology   Why:  Feb 15 at 2;30 Pm .   Contact information:   [address] [place] [address] [phone]       Schedule an appointment as soon as possible for a visit with Lajean Saver, MD.   Specialty:  General Surgery   Why:  to evaluate hernias for possible repair   Contact information:   [address] [place]  [place] [address] [phone]       Follow up with Garth Schlatter, MD. Schedule an appointment as soon as possible for a visit in 1 week.   Specialty:  Family Medicine   Why:  Follow up appt after recent hospitalization   Contact information:   [facility] [address] [place] [address] [phone]       Schedule an appointment as soon as possible for a visit with [facility].   Why:  As needed   Contact information:   [address] [place] [address] [place] [address] [phone]       The results of significant diagnostics from this hospitalization (including imaging, microbiology, ancillary and laboratory) are listed below for reference.    Significant Diagnostic Studies: Ct Abdomen Pelvis Wo Contrast  01/06/2016  CLINICAL DATA:  Right-sided abdominal pain. Possible calculi on conventional radiographs. History of necrotizing pancreatitis and diabetes. History pancreatic surgery. EXAM: CT ABDOMEN AND PELVIS WITHOUT CONTRAST TECHNIQUE: Multidetector CT imaging of the abdomen and pelvis was performed following the standard protocol without IV contrast. COMPARISON:  01/06/2016 ultrasounds and conventional radiographs FINDINGS: Body habitus reduces diagnostic sensitivity and specificity. Lower chest: Very dense mitral valve calcification observed. Mosaic attenuation at the left lung base with associated atelectasis. Mild cardiomegaly. Hepatobiliary: Clustered small gallstones measuring up to 6 mm in diameter are settled dependently in the gallbladder. None of these are directly observed to be in the common bile duct or common hepatic duct. Punctate calcification along the capsule of the inferior tip of the right hepatic lobe, likely due to remote inflammation. Pancreas: Atrophic pancreatic body and pancreatic head. Poor definition of fat planes around the pancreatic  tail. Aortoiliac atherosclerotic vascular disease. In the vicinity of the spleen,  possibly from acute inflammation or chronic scarring. Spleen: Unremarkable Adrenals/Urinary Tract: Chronic appearing calcification in the upper portion of the left adrenal gland. Right adrenal mass measuring 3.1 by 2.1 cm has internal density of 14 Hounsfield units, technically nonspecific. Mild thinning of the renal cortex bilaterally with prominence of renal sinus adipose tissue. Prominent right collecting system and proximal right hydroureter. Suspected wall thickening in the right collecting system. Normal left ureter. No urinary tract calculi are identified. Tiny amount of gas in the urinary bladder. Query recent catheterization. Stomach/Bowel: Right periumbilical hernia containing a loop of small bowel with dilated bowel up to about 5.5 cm proximal to this loop, and with the distal small bowel not dilated, shown on image 44 series 3 and image 81 series 6. There also left para umbilical abdominal herniations, 1 containing transverse colon and the other containing a dilated loop of small bowel, but not acting as a focus for obstruction. Vascular/Lymphatic: Aortoiliac atherosclerotic vascular disease. No pathologic adenopathy observed. Reproductive: Calcification along the anterior uterine body measuring 1.6 cm, probably from a subserosal fibroid. Adnexa unremarkable. Other: Suspected prior laparotomy with some separation of the pannus into left and right components. Both demonstrate abnormal subcutaneous edema and skin thickening which is not fully included on today' s exam because of body habitus. On the left side there is a greater degree of stranding in the pannus raising suspicion for panniculitis. Musculoskeletal: Bilateral paramidline hernias, with a right ventral hernia causing obstruction of a loop of small bowel, and several left ventral hernias containing transverse colon and small bowel. There is potentially some fluid in the right hernia. Lumbar spondylosis and degenerative disc disease with grade 1  anterolisthesis at L4-5, loss of disc height at L5-S1, and potential central narrowing of the thecal sac at L4-5 due to degenerative disc disease. There notable lucencies along the acetabular roof and posterior right acetabulum, possibly geodes. Given the hazy surrounding bone density in this vicinity, fibrous dysplasia is not excluded. IMPRESSION: 1. Small bowel obstruction due to a right paracentral hernia containing a loop of small bowel with dilated the efferent component and nondilated efferent component. There is probably some fluid in this hernia sac and strangulation is not excluded. 2. There several left paramedian hernia sacs, 1 containing transverse colon and the lower hernia containing a dilated loop of small bowel. This left-sided hernia is not thought to be the site of obstruction. 3. Bilateral panniculitis left greater than right. 4. Fullness of the right collecting system and right proximal ureter possibly with slight wall thickening in the right proximal collecting system. Inflammation in this vicinity is not excluded but I do not see urinary tract calculi 5. Clustered tiny gallstones observed. 6. Poor definition of fat planes along the pancreatic tail, quite likely from chronic scarring although active pancreatitis is not completely excluded. Atrophic pancreatic body and head likely from remote pancreatic injury. 7. Prominent dense calcification of the mitral valve with mild cardiomegaly. 8. Atelectasis in the left lower lobe. 9. Calcification situated between the anterior uterine body in urinary bladder probably a subserosal calcified fibroid. 10. Lower lumbar spondylosis and degenerative disc disease with potential mild impingement at L4-5. 11. Lucencies along the right acetabular roof and posterior acetabulum with surrounding ground-glass matrix bone, query fibrous dysplasia and/or geodes with surrounding reactive findings. Electronically Signed   By: Gaylyn Rong M.D.   On: 01/06/2016  19:20   US Abdomen Complete  01/06/2016  CLINICAL DATA:  Five day history of right-sided pain EXAM: ABDOMEN ULTRASOUND COMPLETE COMPARISON:  None. FINDINGS: Gallbladder: Within the gallbladder, there are multiple small echogenic foci which move and shadow consistent with cholelithiasis. Largest individual gallstone measures 5 mm in length. There is no gallbladder wall thickening or pericholecystic fluid. No sonographic Murphy sign noted by sonographer. Common bile duct: Diameter: 6 mm. No intrahepatic, common hepatic, or common bile duct dilatation. Liver: No focal lesion identified. Within normal limits in parenchymal echogenicity. IVC: No abnormality visualized. Pancreas: Visualized portion unremarkable. Portions of pancreas obscured by gas. Spleen: Size and appearance within normal limits. Right Kidney: Length: 11.7 cm. Echogenicity within normal limits. No mass or visualized. There is mild fullness of the right renal pelvis and upper pole collecting system. Obstructing focus not seen. Left Kidney: Length: 13.3 cm. Echogenicity within normal limits. No mass or hydronephrosis visualized. Abdominal aorta: No aneurysm visualized. Other findings: No demonstrable ascites. There is a hypoechoic lesion just superior to the right kidney, likely an adrenal lesion on the right. This lesion measures 2.5 x 1.3 x 2.5 cm. IMPRESSION: Cholelithiasis. No gallbladder wall thickening or pericholecystic fluid. Fullness of the right renal pelvis and upper pole collecting system. Obstructing lesion not appreciable by ultrasound. This finding may warrant noncontrast enhanced CT to assess for potential ureteral calculus. Probable right renal mass measuring 2.5 x 1.3 x 2.5 cm. CT could also be helpful to further assess this area. Study otherwise unremarkable. Note that portions of pancreas not seen due to overlying gas. Electronically Signed   By: Bretta Bang III M.D.   On: 01/06/2016 18:02   Dg Abd 2 Views  01/08/2016   CLINICAL DATA:  Abdominal pain.  Small-bowel obstruction. EXAM: ABDOMEN - 2 VIEW COMPARISON:  Abdominal radiographs 01/07/2016. CT of the abdomen and pelvis 01/06/2016. FINDINGS: Dilation of small bowel is slightly improved. There is persistent distention of both large and small bowel in the left lower quadrant. Degenerative changes are present in the lower lumbar spine. Atherosclerotic calcifications are present. There is no free air. IMPRESSION: 1. Slight improvement and small bowel distension with persistent left-sided small bowel obstruction. Electronically Signed   By: Marin Roberts M.D.   On: 01/08/2016 08:56   Dg Abd 2 Views  01/07/2016  CLINICAL DATA:  Small-bowel obstruction EXAM: ABDOMEN - 2 VIEW COMPARISON:  01/06/2016 FINDINGS: There are several loops of mid to left abdominal small bowel which are distended up to 5.4 cm in diameter. There is air in the stomach and there are small bowel air-fluid levels. There is minimal air into the colon. IMPRESSION: Findings again consistent with small-bowel obstruction Electronically Signed   By: Esperanza Heir M.D.   On: 01/07/2016 12:17   Dg Abd Acute W/chest  01/06/2016  CLINICAL DATA:  Right upper quadrant abdominal pain with vomiting. History of hypertension and diabetes. EXAM: DG ABDOMEN ACUTE W/ 1V CHEST COMPARISON:  None. FINDINGS: Examination is limited by body habitus. The heart size is at the upper limits of normal. There is patchy airspace disease in the left lower lobe. The right lung is clear. No pneumothorax or significant pleural effusion identified. The bowel gas pattern is normal. There is no free intraperitoneal air. 12 mm calcification overlies the lower pole of the right kidney. Left pelvic calcifications likely vascular. Mild degenerative changes are present within the lumbar spine and at the right hip. IMPRESSION: 1. Patchy left lower lobe atelectasis or infiltrate. 2. No acute abdominal findings demonstrated. 3. Right mid and  pelvic  calcifications as described. The former could reflect a renal calculus or gallstone. Electronically Signed   By: Carey Bullocks M.D.   On: 01/06/2016 14:56    Microbiology: Recent Results (from the past 240 hour(s))  Urine culture     Status: None   Collection Time: 01/05/16  1:30 PM  Result Value Ref Range Status   Specimen Description URINE, CLEAN CATCH  Final   Special Requests none Normal  Final   Culture   Final    >=100,000 COLONIES/mL KLEBSIELLA PNEUMONIAE Performed at [facility]    Report Status 01/07/2016 FINAL  Final   Organism ID, Bacteria KLEBSIELLA PNEUMONIAE  Final      Susceptibility   Klebsiella pneumoniae - MIC*    AMPICILLIN >=32 RESISTANT Resistant     CEFAZOLIN <=4 SENSITIVE Sensitive     CEFTRIAXONE <=1 SENSITIVE Sensitive     CIPROFLOXACIN <=0.25 SENSITIVE Sensitive     GENTAMICIN <=1 SENSITIVE Sensitive     IMIPENEM <=0.25 SENSITIVE Sensitive     NITROFURANTOIN 64 INTERMEDIATE Intermediate     TRIMETH/SULFA <=20 SENSITIVE Sensitive     AMPICILLIN/SULBACTAM 8 SENSITIVE Sensitive     PIP/TAZO <=4 SENSITIVE Sensitive     * >=100,000 COLONIES/mL KLEBSIELLA PNEUMONIAE  Blood culture (routine x 2)     Status: None   Collection Time: 01/06/16  3:13 PM  Result Value Ref Range Status   Specimen Description BLOOD LEFT ANTECUBITAL  Final   Special Requests BOTTLES DRAWN AEROBIC AND ANAEROBIC  Final   Culture   Final    NO GROWTH 5 DAYS Performed at [facility]    Report Status 01/11/2016 FINAL  Final  Blood culture (routine x 2)     Status: None   Collection Time: 01/06/16  3:14 PM  Result Value Ref Range Status   Specimen Description BLOOD RIGHT FOREARM  Final   Special Requests BOTTLES DRAWN AEROBIC ONLY  Final   Culture   Final    NO GROWTH 5 DAYS Performed at [facility]    Report Status 01/11/2016 FINAL  Final     Labs: Basic Metabolic Panel:  Recent Labs Lab 01/07/16 0355 01/08/16 0530 01/09/16 0425  01/12/16 0532 01/13/16 0415  NA 136 141 140 137 138  K 4.3 4.6 3.9 4.2 4.1  CL 101 110 107 101 103  CO2 23 23 23 23 23   GLUCOSE 294* 163* 151* 271* 229*  BUN 58* 35* 19 12 16   CREATININE 1.24* 0.84 0.74 0.69 0.73  CALCIUM 9.2 8.9 9.1 8.8* 9.1   Liver Function Tests:  Recent Labs Lab 01/06/16 1448  AST 15  ALT 13*  ALKPHOS 54  BILITOT 0.8  PROT 8.1  ALBUMIN 3.5    Recent Labs Lab 01/06/16 1448  LIPASE 21   No results for input(s): AMMONIA in the last 168 hours. CBC:  Recent Labs Lab 01/06/16 1448 01/08/16 0530 01/09/16 0425 01/12/16 0532 01/13/16 0415  WBC 5.6 5.3 6.0 5.8 7.0  HGB 11.7* 9.8* 9.8* 9.5* 9.9*  HCT 37.2 30.2* 31.5* 30.3* 31.3*  MCV 91.9 92.1 93.2 92.9 92.1  PLT 327 247 266 232 251   Cardiac Enzymes:  Recent Labs Lab 01/06/16 1448  TROPONINI 0.05*   BNP: BNP (last 3 results) No results for input(s): BNP in the last 8760 hours.  ProBNP (last 3 results) No results for input(s): PROBNP in the last 8760 hours.  CBG:  Recent Labs Lab 01/12/16 0750 01/12/16 1206 01/12/16 1637 01/12/16 2149  01/13/16 0747  GLUCAP 228* 236* 200* 202* 206*

## 2016-01-13 NOTE — Progress Notes (Signed)
Physical Therapy Treatment Patient Details Name: Valerie DurhamBeverly [name] MRN: [id number] DOB: [birth year] Today's Date: 01/13/2016    History of Present Illness Pt is a 70 yo WF with 4 day history of abdominal pain, poor appetite, nausea, and emesis. Denies fever or chills. Admitted to medical service with abdominal pain and suspected kidney stone. 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    PT Comments    Pt feeling better and present with no ABD pain.  Pt was able to self get OOB and amb a functional distance with walker.  Pt appears at prior level of mobility.    Follow Up Recommendations  Supervision - Intermittent     Equipment Recommendations  None recommended by PT    Recommendations for Other Services       Precautions / Restrictions Precautions Precautions: None Restrictions Weight Bearing Restrictions: No    Mobility  Bed Mobility Overal bed mobility: Modified Independent Bed Mobility: Supine to Sit;Sit to Supine     Supine to sit: Modified independent (Device/Increase time)     General bed mobility comments: increased time and use of B rails and multiple pillows for comfort.  Spouse assisted.    Transfers Overall transfer level: Modified independent Equipment used: Rolling walker (2 wheeled) Transfers: Sit to/from Stand Sit to Stand: Modified independent (Device/Increase time)         General transfer comment: good safety cognition and use of hands to steady self  Ambulation/Gait Ambulation/Gait assistance: Modified independent (Device/Increase time) Ambulation Distance (Feet): 75 Feet Assistive device: Rolling walker (2 wheeled) Gait Pattern/deviations: Step-through pattern Gait velocity: WFL   General Gait Details: good alternating gait with good speed.  Appears at prior level.   Stairs             Wheelchair Mobility    Modified Rankin (Stroke Patients Only)       Balance                                    Cognition Arousal/Alertness: Awake/alert Behavior During Therapy: WFL for tasks assessed/performed Overall Cognitive Status: Within Functional Limits for tasks assessed                      Exercises      General Comments        Pertinent Vitals/Pain Pain Assessment: No/denies pain    Home Living                      Prior Function            PT Goals (current goals can now be found in the care plan section) Progress towards PT goals: Progressing toward goals    Frequency  Min 3X/week    PT Plan Current plan remains appropriate    Co-evaluation             End of Session   Activity Tolerance: Patient tolerated treatment well Patient left: in bed     Time: [id number] PT Time Calculation (min) (ACUTE ONLY): 13 min  Charges:  $Gait Training: 8-22 mins                    G Codes:      [name] FiscalLori  Valerie [name]  PTA [facility]  Acute  Rehab Pager      [phone]

## 2016-01-13 NOTE — Discharge Instructions (Signed)
Urinary Tract Infection Urinary tract infections (UTIs) can develop anywhere along your urinary tract. Your urinary tract is your body's drainage system for removing wastes and extra water. Your urinary tract includes two kidneys, two ureters, a bladder, and a urethra. Your kidneys are a pair of bean-shaped organs. Each kidney is about the size of your fist. They are located below your ribs, one on each side of your spine. CAUSES Infections are caused by microbes, which are microscopic organisms, including fungi, viruses, and bacteria. These organisms are so small that they can only be seen through a microscope. Bacteria are the microbes that most commonly cause UTIs. SYMPTOMS  Symptoms of UTIs may vary by age and gender of the patient and by the location of the infection. Symptoms in young women typically include a frequent and intense urge to urinate and a painful, burning feeling in the bladder or urethra during urination. Older women and men are more likely to be tired, shaky, and weak and have muscle aches and abdominal pain. A fever may mean the infection is in your kidneys. Other symptoms of a kidney infection include pain in your back or sides below the ribs, nausea, and vomiting. DIAGNOSIS To diagnose a UTI, your caregiver will ask you about your symptoms. Your caregiver will also ask you to provide a urine sample. The urine sample will be tested for bacteria and white blood cells. White blood cells are made by your body to help fight infection. TREATMENT  Typically, UTIs can be treated with medication. Because most UTIs are caused by a bacterial infection, they usually can be treated with the use of antibiotics. The choice of antibiotic and length of treatment depend on your symptoms and the type of bacteria causing your infection. HOME CARE INSTRUCTIONS  If you were prescribed antibiotics, take them exactly as your caregiver instructs you. Finish the medication even if you feel better after  you have only taken some of the medication.  Drink enough water and fluids to keep your urine clear or pale yellow.  Avoid caffeine, tea, and carbonated beverages. They tend to irritate your bladder.  Empty your bladder often. Avoid holding urine for long periods of time.  Empty your bladder before and after sexual intercourse.  After a bowel movement, women should cleanse from front to back. Use each tissue only once. SEEK MEDICAL CARE IF:   You have back pain.  You develop a fever.  Your symptoms do not begin to resolve within 3 days. SEEK IMMEDIATE MEDICAL CARE IF:   You have severe back pain or lower abdominal pain.  You develop chills.  You have nausea or vomiting.  You have continued burning or discomfort with urination. MAKE SURE YOU:   Understand these instructions.  Will watch your condition.  Will get help right away if you are not doing well or get worse.   This information is not intended to replace advice given to you by your health care provider. Make sure you discuss any questions you have with your health care provider.   Document Released: 09/23/2005 Document Revised: 09/04/2015 Document Reviewed: 01/22/2012 Elsevier Interactive Patient Education 2016 [employer] Ciprofloxacin tablets What is this medicine? CIPROFLOXACIN (sip roe FLOX a sin) is a quinolone antibiotic. It is used to treat certain kinds of bacterial infections. It will not work for colds, flu, or other viral infections. This medicine may be used for other purposes; ask your health care provider or pharmacist if you have questions. What should I tell my  health care provider before I take this medicine? They need to know if you have any of these conditions: -bone problems -cerebral disease -history of low levels of potassium in the blood -joint problems -irregular heartbeat -kidney disease -myasthenia gravis -seizures -tendon problems -tingling of the fingers or toes, or other  nerve disorder -an unusual or allergic reaction to ciprofloxacin, other antibiotics or medicines, foods, dyes, or preservatives -pregnant or trying to get pregnant -breast-feeding How should I use this medicine? Take this medicine by mouth with a glass of water. Follow the directions on the prescription label. Take your medicine at regular intervals. Do not take your medicine more often than directed. Take all of your medicine as directed even if you think your are better. Do not skip doses or stop your medicine early. You can take this medicine with food or on an empty stomach. It can be taken with a meal that contains dairy or calcium, but do not take it alone with a dairy product, like milk or yogurt or calcium-fortified juice. A special MedGuide will be given to you by the pharmacist with each prescription and refill. Be sure to read this information carefully each time. Talk to your pediatrician regarding the use of this medicine in children. Special care may be needed. Overdosage: If you think you have taken too much of this medicine contact a poison control center or emergency room at once. NOTE: This medicine is only for you. Do not share this medicine with others. What if I miss a dose? If you miss a dose, take it as soon as you can. If it is almost time for your next dose, take only that dose. Do not take double or extra doses. What may interact with this medicine? Do not take this medicine with any of the following medications: -cisapride -droperidol -terfenadine -tizanidine This medicine may also interact with the following medications: -antacids -birth control pills -caffeine -cyclosporin -didanosine (ddI) buffered tablets or powder -medicines for diabetes -medicines for inflammation like ibuprofen, naproxen -methotrexate -multivitamins -omeprazole -phenytoin -probenecid -sucralfate -theophylline -warfarin This list may not describe all possible interactions. Give your  health care provider a list of all the medicines, herbs, non-prescription drugs, or dietary supplements you use. Also tell them if you smoke, drink alcohol, or use illegal drugs. Some items may interact with your medicine. What should I watch for while using this medicine? Tell your doctor or health care professional if your symptoms do not improve. Do not treat diarrhea with over the counter products. Contact your doctor if you have diarrhea that lasts more than 2 days or if it is severe and watery. You may get drowsy or dizzy. Do not drive, use machinery, or do anything that needs mental alertness until you know how this medicine affects you. Do not stand or sit up quickly, especially if you are an older patient. This reduces the risk of dizzy or fainting spells. This medicine can make you more sensitive to the sun. Keep out of the sun. If you cannot avoid being in the sun, wear protective clothing and use sunscreen. Do not use sun lamps or tanning beds/booths. Avoid antacids, aluminum, calcium, iron, magnesium, and zinc products for 6 hours before and 2 hours after taking a dose of this medicine. What side effects may I notice from receiving this medicine? Side effects that you should report to your doctor or health care professional as soon as possible: -allergic reactions like skin rash or hives, swelling of the face, lips, or  tongue -anxious -confusion -depressed mood -diarrhea -fast, irregular heartbeat -hallucination, loss of contact with reality -joint, muscle, or tendon pain or swelling -pain, tingling, numbness in the hands or feet -suicidal thoughts or other mood changes -sunburn -unusually weak or tired Side effects that usually do not require medical attention (report to your doctor or health care professional if they continue or are bothersome): -dry mouth -headache -nausea -trouble sleeping This list may not describe all possible side effects. Call your doctor for medical  advice about side effects. You may report side effects to FDA at 1-800-FDA-1088. Where should I keep my medicine? Keep out of the reach of children. Store at room temperature below 30 degrees C (86 degrees F). Keep container tightly closed. Throw away any unused medicine after the expiration date. NOTE: This sheet is a summary. It may not cover all possible information. If you have questions about this medicine, talk to your doctor, pharmacist, or health care provider.    2016, Elsevier/Gold Standard. (2015-07-25 12:57:02) Doxycycline tablets or capsules What is this medicine? DOXYCYCLINE (dox i SYE kleen) is a tetracycline antibiotic. It kills certain bacteria or stops their growth. It is used to treat many kinds of infections, like dental, skin, respiratory, and urinary tract infections. It also treats acne, Lyme disease, malaria, and certain sexually transmitted infections. This medicine may be used for other purposes; ask your health care provider or pharmacist if you have questions. What should I tell my health care provider before I take this medicine? They need to know if you have any of these conditions: -liver disease -long exposure to sunlight like working outdoors -stomach problems like colitis -an unusual or allergic reaction to doxycycline, tetracycline antibiotics, other medicines, foods, dyes, or preservatives -pregnant or trying to get pregnant -breast-feeding How should I use this medicine? Take this medicine by mouth with a full glass of water. Follow the directions on the prescription label. It is best to take this medicine without food, but if it upsets your stomach take it with food. Take your medicine at regular intervals. Do not take your medicine more often than directed. Take all of your medicine as directed even if you think you are better. Do not skip doses or stop your medicine early. Talk to your pediatrician regarding the use of this medicine in children. While  this drug may be prescribed for selected conditions, precautions do apply. Overdosage: If you think you have taken too much of this medicine contact a poison control center or emergency room at once. NOTE: This medicine is only for you. Do not share this medicine with others. What if I miss a dose? If you miss a dose, take it as soon as you can. If it is almost time for your next dose, take only that dose. Do not take double or extra doses. What may interact with this medicine? -antacids -barbiturates -birth control pills -bismuth subsalicylate -carbamazepine -methoxyflurane -other antibiotics -phenytoin -vitamins that contain iron -warfarin This list may not describe all possible interactions. Give your health care provider a list of all the medicines, herbs, non-prescription drugs, or dietary supplements you use. Also tell them if you smoke, drink alcohol, or use illegal drugs. Some items may interact with your medicine. What should I watch for while using this medicine? Tell your doctor or health care professional if your symptoms do not improve. Do not treat diarrhea with over the counter products. Contact your doctor if you have diarrhea that lasts more than 2 days or if it  is severe and watery. Do not take this medicine just before going to bed. It may not dissolve properly when you lay down and can cause pain in your throat. Drink plenty of fluids while taking this medicine to also help reduce irritation in your throat. This medicine can make you more sensitive to the sun. Keep out of the sun. If you cannot avoid being in the sun, wear protective clothing and use sunscreen. Do not use sun lamps or tanning beds/booths. Birth control pills may not work properly while you are taking this medicine. Talk to your doctor about using an extra method of birth control. If you are being treated for a sexually transmitted infection, avoid sexual contact until you have finished your treatment. Your  sexual partner may also need treatment. Avoid antacids, aluminum, calcium, magnesium, and iron products for 4 hours before and 2 hours after taking a dose of this medicine. If you are using this medicine to prevent malaria, you should still protect yourself from contact with mosquitos. Stay in screened-in areas, use mosquito nets, keep your body covered, and use an insect repellent. What side effects may I notice from receiving this medicine? Side effects that you should report to your doctor or health care professional as soon as possible: -allergic reactions like skin rash, itching or hives, swelling of the face, lips, or tongue -difficulty breathing -fever -itching in the rectal or genital area -pain on swallowing -redness, blistering, peeling or loosening of the skin, including inside the mouth -severe stomach pain or cramps -unusual bleeding or bruising -unusually weak or tired -yellowing of the eyes or skin Side effects that usually do not require medical attention (report to your doctor or health care professional if they continue or are bothersome): -diarrhea -loss of appetite -nausea, vomiting This list may not describe all possible side effects. Call your doctor for medical advice about side effects. You may report side effects to FDA at 1-800-FDA-1088. Where should I keep my medicine? Keep out of the reach of children. Store at room temperature, below 30 degrees C (86 degrees F). Protect from light. Keep container tightly closed. Throw away any unused medicine after the expiration date. Taking this medicine after the expiration date can make you seriously ill. NOTE: This sheet is a summary. It may not cover all possible information. If you have questions about this medicine, talk to your doctor, pharmacist, or health care provider.    2016, Elsevier/Gold Standard. (2015-04-05 12:10:28)

## 2016-01-13 NOTE — Care Management Note (Signed)
Case Management Note  Patient Details  Name: Vanessa DurhamBeverly Duchene MRN: [id number] Date of Birth: 1946-02-20  Subjective/Objective:  [facility]HC ordered[facility] & [facility]                  Action/Plan:d/c home w/HHC.   Expected Discharge Date:   (unknown)               Expected Discharge Plan:  Home w Home Health Services  In-House Referral:     Discharge planning Services  CM Consult  Post Acute Care Choice:    Choice offered to:  Patient  DME Arranged:    DME Agency:     HH Arranged:  RN, PT, OT, Nurse's Aide HH Agency:  [facility]  Status of Service:  Completed, signed off  Medicare Important Message Given:  Yes Date Medicare IM Given:    Medicare IM give by:    Date Additional Medicare IM Given:    Additional Medicare Important Message give by:     If discussed at Long Length of Stay Meetings, dates discussed:    Additional Comments:  Lanier ClamMahabir, Elizabeth Paulsen, RN 01/13/2016, 10:43 AM

## 2016-01-14 ENCOUNTER — Inpatient Hospital Stay (HOSPITAL_COMMUNITY)
Admission: EM | Admit: 2016-01-14 | Discharge: 2016-01-30 | DRG: 335 | Disposition: A | Discharge status: Skilled Nursing Facility | Payer: Medicare Other | Attending: Internal Medicine | Admitting: Internal Medicine

## 2016-01-14 ENCOUNTER — Emergency Department (HOSPITAL_COMMUNITY): Payer: Medicare Other

## 2016-01-14 DIAGNOSIS — E1122 Type 2 diabetes mellitus with diabetic chronic kidney disease: Secondary | ICD-10-CM | POA: Diagnosis present

## 2016-01-14 DIAGNOSIS — Z9842 Cataract extraction status, left eye: Secondary | ICD-10-CM | POA: Diagnosis not present

## 2016-01-14 DIAGNOSIS — E860 Dehydration: Secondary | ICD-10-CM | POA: Diagnosis present

## 2016-01-14 DIAGNOSIS — E1121 Type 2 diabetes mellitus with diabetic nephropathy: Secondary | ICD-10-CM | POA: Diagnosis present

## 2016-01-14 DIAGNOSIS — K219 Gastro-esophageal reflux disease without esophagitis: Secondary | ICD-10-CM | POA: Diagnosis present

## 2016-01-14 DIAGNOSIS — I10 Essential (primary) hypertension: Secondary | ICD-10-CM | POA: Diagnosis present

## 2016-01-14 DIAGNOSIS — D62 Acute posthemorrhagic anemia: Secondary | ICD-10-CM | POA: Diagnosis not present

## 2016-01-14 DIAGNOSIS — M7989 Other specified soft tissue disorders: Secondary | ICD-10-CM | POA: Diagnosis present

## 2016-01-14 DIAGNOSIS — N184 Chronic kidney disease, stage 4 (severe): Secondary | ICD-10-CM | POA: Diagnosis present

## 2016-01-14 DIAGNOSIS — K430 Incisional hernia with obstruction, without gangrene: Secondary | ICD-10-CM | POA: Diagnosis present

## 2016-01-14 DIAGNOSIS — E785 Hyperlipidemia, unspecified: Secondary | ICD-10-CM | POA: Diagnosis present

## 2016-01-14 DIAGNOSIS — Z9841 Cataract extraction status, right eye: Secondary | ICD-10-CM | POA: Diagnosis not present

## 2016-01-14 DIAGNOSIS — Z90411 Acquired partial absence of pancreas: Secondary | ICD-10-CM | POA: Diagnosis not present

## 2016-01-14 DIAGNOSIS — E876 Hypokalemia: Secondary | ICD-10-CM | POA: Diagnosis not present

## 2016-01-14 DIAGNOSIS — J9601 Acute respiratory failure with hypoxia: Secondary | ICD-10-CM | POA: Diagnosis not present

## 2016-01-14 DIAGNOSIS — R0600 Dyspnea, unspecified: Secondary | ICD-10-CM

## 2016-01-14 DIAGNOSIS — I129 Hypertensive chronic kidney disease with stage 1 through stage 4 chronic kidney disease, or unspecified chronic kidney disease: Secondary | ICD-10-CM | POA: Diagnosis present

## 2016-01-14 DIAGNOSIS — Z6841 Body Mass Index (BMI) 40.0 and over, adult: Secondary | ICD-10-CM | POA: Diagnosis not present

## 2016-01-14 DIAGNOSIS — K460 Unspecified abdominal hernia with obstruction, without gangrene: Secondary | ICD-10-CM | POA: Diagnosis present

## 2016-01-14 DIAGNOSIS — D631 Anemia in chronic kidney disease: Secondary | ICD-10-CM | POA: Diagnosis present

## 2016-01-14 DIAGNOSIS — N179 Acute kidney failure, unspecified: Secondary | ICD-10-CM | POA: Diagnosis present

## 2016-01-14 DIAGNOSIS — R109 Unspecified abdominal pain: Secondary | ICD-10-CM

## 2016-01-14 DIAGNOSIS — E1169 Type 2 diabetes mellitus with other specified complication: Secondary | ICD-10-CM | POA: Diagnosis present

## 2016-01-14 DIAGNOSIS — Z794 Long term (current) use of insulin: Secondary | ICD-10-CM

## 2016-01-14 DIAGNOSIS — Z79899 Other long term (current) drug therapy: Secondary | ICD-10-CM

## 2016-01-14 DIAGNOSIS — L03116 Cellulitis of left lower limb: Secondary | ICD-10-CM | POA: Diagnosis present

## 2016-01-14 DIAGNOSIS — Z87891 Personal history of nicotine dependence: Secondary | ICD-10-CM

## 2016-01-14 DIAGNOSIS — K56609 Unspecified intestinal obstruction, unspecified as to partial versus complete obstruction: Secondary | ICD-10-CM | POA: Diagnosis present

## 2016-01-14 DIAGNOSIS — D638 Anemia in other chronic diseases classified elsewhere: Secondary | ICD-10-CM | POA: Diagnosis not present

## 2016-01-14 DIAGNOSIS — I959 Hypotension, unspecified: Secondary | ICD-10-CM | POA: Diagnosis present

## 2016-01-14 DIAGNOSIS — N183 Chronic kidney disease, stage 3 (moderate): Secondary | ICD-10-CM | POA: Diagnosis not present

## 2016-01-14 DIAGNOSIS — Z961 Presence of intraocular lens: Secondary | ICD-10-CM | POA: Diagnosis present

## 2016-01-14 DIAGNOSIS — J690 Pneumonitis due to inhalation of food and vomit: Secondary | ICD-10-CM | POA: Diagnosis not present

## 2016-01-14 DIAGNOSIS — F419 Anxiety disorder, unspecified: Secondary | ICD-10-CM | POA: Diagnosis present

## 2016-01-14 DIAGNOSIS — B3749 Other urogenital candidiasis: Secondary | ICD-10-CM | POA: Diagnosis present

## 2016-01-14 DIAGNOSIS — K5669 Other intestinal obstruction: Secondary | ICD-10-CM | POA: Diagnosis not present

## 2016-01-14 DIAGNOSIS — E1151 Type 2 diabetes mellitus with diabetic peripheral angiopathy without gangrene: Secondary | ICD-10-CM | POA: Diagnosis present

## 2016-01-14 DIAGNOSIS — N390 Urinary tract infection, site not specified: Secondary | ICD-10-CM | POA: Diagnosis not present

## 2016-01-14 DIAGNOSIS — L8990 Pressure ulcer of unspecified site, unspecified stage: Secondary | ICD-10-CM | POA: Insufficient documentation

## 2016-01-14 LAB — CBC WITH DIFFERENTIAL/PLATELET
BASOS ABS: 0 10*3/uL (ref 0.0–0.1)
Basophils Relative: 0 %
Eosinophils Absolute: 0.1 10*3/uL (ref 0.0–0.7)
Eosinophils Relative: 1 %
HCT: 33.2 % — ABNORMAL LOW (ref 36.0–46.0)
HEMOGLOBIN: 10.3 g/dL — AB (ref 12.0–15.0)
LYMPHS ABS: 0.6 10*3/uL — AB (ref 0.7–4.0)
LYMPHS PCT: 6 %
MCH: 28.4 pg (ref 26.0–34.0)
MCHC: 31 g/dL (ref 30.0–36.0)
MCV: 91.5 fL (ref 78.0–100.0)
Monocytes Absolute: 0.4 10*3/uL (ref 0.1–1.0)
Monocytes Relative: 5 %
NEUTROS PCT: 88 %
Neutro Abs: 8 10*3/uL — ABNORMAL HIGH (ref 1.7–7.7)
PLATELETS: 254 10*3/uL (ref 150–400)
RBC: 3.63 MIL/uL — AB (ref 3.87–5.11)
RDW: 14.7 % (ref 11.5–15.5)
WBC: 9.1 10*3/uL (ref 4.0–10.5)

## 2016-01-14 LAB — COMPREHENSIVE METABOLIC PANEL
ALK PHOS: 61 U/L (ref 38–126)
ALT: 13 U/L — AB (ref 14–54)
AST: 19 U/L (ref 15–41)
Albumin: 3 g/dL — ABNORMAL LOW (ref 3.5–5.0)
Anion gap: 16 — ABNORMAL HIGH (ref 5–15)
BUN: 31 mg/dL — AB (ref 6–20)
CALCIUM: 9.8 mg/dL (ref 8.9–10.3)
CHLORIDE: 100 mmol/L — AB (ref 101–111)
CO2: 23 mmol/L (ref 22–32)
CREATININE: 2.15 mg/dL — AB (ref 0.44–1.00)
GFR, EST AFRICAN AMERICAN: 25 mL/min — AB (ref >60)
GFR, EST NON AFRICAN AMERICAN: 22 mL/min — AB (ref >60)
Glucose, Bld: 282 mg/dL — ABNORMAL HIGH (ref 65–99)
Potassium: 4.2 mmol/L (ref 3.5–5.1)
SODIUM: 139 mmol/L (ref 135–145)
Total Bilirubin: 1 mg/dL (ref 0.3–1.2)
Total Protein: 6.8 g/dL (ref 6.5–8.1)

## 2016-01-14 LAB — URINE MICROSCOPIC-ADD ON

## 2016-01-14 LAB — LIPASE, BLOOD: LIPASE: 24 U/L (ref 11–51)

## 2016-01-14 LAB — URINALYSIS, ROUTINE W REFLEX MICROSCOPIC
GLUCOSE, UA: 100 mg/dL — AB
KETONES UR: NEGATIVE mg/dL
Nitrite: POSITIVE — AB
PROTEIN: 100 mg/dL — AB
Specific Gravity, Urine: 1.024 (ref 1.005–1.030)
pH: 5 (ref 5.0–8.0)

## 2016-01-14 LAB — I-STAT CG4 LACTIC ACID, ED: Lactic Acid, Venous: 1.15 mmol/L (ref 0.5–2.0)

## 2016-01-14 MED ORDER — SODIUM CHLORIDE 0.9 % IV BOLUS (SEPSIS): 1000.0000 mL | Freq: Once | INTRAVENOUS | Status: DC

## 2016-01-14 MED ORDER — SODIUM CHLORIDE 0.9 % IV SOLN
1000.0000 mL | Freq: Once | INTRAVENOUS | Status: AC
  Administered 2016-01-14: 1000 mL via INTRAVENOUS

## 2016-01-14 MED ORDER — ONDANSETRON HCL 4 MG/2ML IJ SOLN
4.0000 mg | Freq: Once | INTRAMUSCULAR | Status: AC
  Administered 2016-01-14: 4 mg via INTRAVENOUS
  Filled 2016-01-14: qty 2

## 2016-01-14 MED ORDER — MORPHINE SULFATE (PF) 4 MG/ML IV SOLN
4.0000 mg | Freq: Once | INTRAVENOUS | Status: AC
  Administered 2016-01-14: 4 mg via INTRAVENOUS
  Filled 2016-01-14: qty 1

## 2016-01-14 MED ORDER — DEXTROSE 5 % IV SOLN
1.0000 g | Freq: Once | INTRAVENOUS | Status: AC
  Administered 2016-01-14: 1 g via INTRAVENOUS
  Filled 2016-01-14: qty 10

## 2016-01-14 MED ORDER — SODIUM CHLORIDE 0.9 % IV SOLN
Freq: Once | INTRAVENOUS | Status: AC
  Administered 2016-01-14: via INTRAVENOUS

## 2016-01-14 NOTE — ED Notes (Signed)
Pt c/o abdominal pain RLQ, three episodes of emesis today, recent hospitalization for abdominal related complaints. Denies chest pain, shortness of breath or palpitations.

## 2016-01-14 NOTE — Consult Note (Signed)
Reason for Consult: Small bowel obstruction, ventral hernia  Referring Physician: Howard Pouch  Valerie [name] is an 70 y.o. female.   HPI: patient re-presents tonight after being discharged yesterday from a 1 week hospitalization for small bowel obstruction and a complex ventral hernia. She has a history of pancreatic debridement in 1985 by Dr. Venetia Maxon admitted  On January 9 with abdominal pain and nausea and CT scan showing a partial bowel obstruction resulting from her ventral hernia. She has multiple significant comorbiditiesand was treated conservatively. She seemed to improve while in the hospital but did not have any imaging clearly showing resolution of her bowel obstruction. After discharge yesterday and eating at home she began to develop frequent nausea and vomiting as well as generalized midabdominal pain which persisted today and she called EMS and was brought to the emergency department. Last bowel movement was 5 days ago. She feels somewhat weak. No melena or hematochezia. Denies urinary symptoms.  Past Medical History  Diagnosis Date  . Diabetes mellitus without complication ([facility])   . Hypertension   . Hyperlipidemia   . Seasonal allergies   . Chronic UTI   . GERD (gastroesophageal reflux disease)   . Necrotizing pancreatitis     Past Surgical History  Procedure Laterality Date  . Pancreas surgery  1985  . Cataract extraction w/ intraocular lens  implant, bilateral      Family History  Problem Relation Age of Onset  . Cancer Father     Prostate cancer  . Cancer Mother     Gynecologic    Social History:  reports that she has quit smoking. She does not have any smokeless tobacco history on file. She reports that she does not drink alcohol. Her drug history is not on file.  Allergies: No Known Allergies  Current Facility-Administered Medications  Medication Dose Route Frequency Provider Last Rate Last Dose  . [START ON 01/15/2016] sodium chloride 0.9 % bolus 1,000  mL  1,000 mL Intravenous Once Rise Patience, MD       Current Outpatient Prescriptions  Medication Sig Dispense Refill  . acetaminophen (TYLENOL EX ST ARTHRITIS PAIN) 500 MG tablet Take 500 mg by mouth every 6 (six) hours as needed for mild pain or moderate pain.     Marland Kitchen amLODipine (NORVASC) 10 MG tablet TAKE 1 TABLET BY MOUTH EVERY DAY 90 tablet 0  . atorvastatin (LIPITOR) 10 MG tablet TAKE 1 TABLET BY MOUTH DAILY 90 tablet 0  . ciprofloxacin (CIPRO) 500 MG tablet Take 1 tablet (500 mg total) by mouth 2 (two) times daily. 6 tablet 0  . CRANBERRY PO Take 1 tablet by mouth 2 (two) times daily.    Marland Kitchen doxycycline (VIBRA-TABS) 100 MG tablet Take 1 tablet (100 mg total) by mouth every 12 (twelve) hours. 20 tablet 0  . fenofibrate 160 MG tablet TAKE 1 TABLET BY MOUTH DAILY 90 tablet 0  . Fexofenadine HCl (ALLEGRA PO) Take 1 tablet by mouth daily as needed (allergies).     . hydrocerin (EUCERIN) CREA Apply 1 application topically 2 (two) times daily. 113 g 0  . insulin regular human CONCENTRATED (HUMULIN R) 500 UNIT/ML injection INJECT 44 UNITS AS MARKED ON THE INSULIN SYRINGE UNDER THE SKIN THREE TIMES DAILY (Patient taking differently: Inject 20-40 Units into the skin 3 (three) times daily with meals as needed (for high blood sugar.). 150 or above take 20 units, 200 or above take 30 units, if 300 or above take 40units.) 40 mL 3  .  mirabegron ER (MYRBETRIQ) 50 MG TB24 tablet Take 50 mg by mouth daily.    . mometasone (NASONEX) 50 MCG/ACT nasal spray Place 2 sprays into the nose daily as needed (allergies).    . naproxen sodium (ANAPROX) 220 MG tablet Take 440 mg by mouth 2 (two) times daily as needed (pain).    . Omega-3 Fatty Acids (FISH OIL) 1000 MG CAPS Take 1 capsule by mouth daily.     Marland Kitchen omeprazole (PRILOSEC) 20 MG capsule Take 20 mg by mouth daily.    . ondansetron (ZOFRAN ODT) 4 MG disintegrating tablet Take 1 tablet (4 mg total) by mouth every 8 (eight) hours as needed for nausea. 6 tablet 0   . solifenacin (VESICARE) 10 MG tablet Take 10 mg by mouth daily.    Marland Kitchen trimethoprim (TRIMPEX) 100 MG tablet Take 100 mg by mouth daily.     Marland Kitchen glucose blood (ONE TOUCH ULTRA TEST) test strip TEST EIGHT TIMES DAILY 750 each 1  . Insulin Pen Needle (NOVOTWIST) 32G X 5 MM MISC Use to inject victoza daily 50 each 0  . ONE TOUCH LANCETS MISC Use to check blood sugars 8 times daily 300 each 5     Results for orders placed or performed during the hospital encounter of 01/14/16 (from the past 48 hour(s))  CBC WITH DIFFERENTIAL     Status: Abnormal   Collection Time: 01/14/16  8:29 PM  Result Value Ref Range   WBC 9.1 4.0 - 10.5 K/uL   RBC 3.63 (L) 3.87 - 5.11 MIL/uL   Hemoglobin 10.3 (L) 12.0 - 15.0 g/dL   HCT 33.2 (L) 36.0 - 46.0 %   MCV 91.5 78.0 - 100.0 fL   MCH 28.4 26.0 - 34.0 pg   MCHC 31.0 30.0 - 36.0 g/dL   RDW 14.7 11.5 - 15.5 %   Platelets 254 150 - 400 K/uL   Neutrophils Relative % 88 %   Neutro Abs 8.0 (H) 1.7 - 7.7 K/uL   Lymphocytes Relative 6 %   Lymphs Abs 0.6 (L) 0.7 - 4.0 K/uL   Monocytes Relative 5 %   Monocytes Absolute 0.4 0.1 - 1.0 K/uL   Eosinophils Relative 1 %   Eosinophils Absolute 0.1 0.0 - 0.7 K/uL   Basophils Relative 0 %   Basophils Absolute 0.0 0.0 - 0.1 K/uL  Comprehensive metabolic panel     Status: Abnormal   Collection Time: 01/14/16  8:29 PM  Result Value Ref Range   Sodium 139 135 - 145 mmol/L   Potassium 4.2 3.5 - 5.1 mmol/L   Chloride 100 (L) 101 - 111 mmol/L   CO2 23 22 - 32 mmol/L   Glucose, Bld 282 (H) 65 - 99 mg/dL   BUN 31 (H) 6 - 20 mg/dL   Creatinine, Ser 2.15 (H) 0.44 - 1.00 mg/dL    Comment: DELTA CHECK NOTED REPEATED TO VERIFY    Calcium 9.8 8.9 - 10.3 mg/dL   Total Protein 6.8 6.5 - 8.1 g/dL   Albumin 3.0 (L) 3.5 - 5.0 g/dL   AST 19 15 - 41 U/L   ALT 13 (L) 14 - 54 U/L   Alkaline Phosphatase 61 38 - 126 U/L   Total Bilirubin 1.0 0.3 - 1.2 mg/dL   GFR calc non Af Amer 22 (L) >60 mL/min   GFR calc Af Amer 25 (L) >60 mL/min     Comment: (NOTE) The eGFR has been calculated using the CKD EPI equation. This calculation has not been  validated in all clinical situations. eGFR's persistently <60 mL/min signify possible Chronic Kidney Disease.    Anion gap 16 (H) 5 - 15  Lipase, blood     Status: None   Collection Time: 01/14/16  8:29 PM  Result Value Ref Range   Lipase 24 11 - 51 U/L  I-Stat CG4 Lactic Acid, ED     Status: None   Collection Time: 01/14/16  8:39 PM  Result Value Ref Range   Lactic Acid, Venous 1.15 0.5 - 2.0 mmol/L  Urinalysis, Routine w reflex microscopic (not at [facility])     Status: Abnormal   Collection Time: 01/14/16  9:42 PM  Result Value Ref Range   Color, Urine ORANGE (A) YELLOW    Comment: BIOCHEMICALS MAY BE AFFECTED BY COLOR   APPearance TURBID (A) CLEAR   Specific Gravity, Urine 1.024 1.005 - 1.030   pH 5.0 5.0 - 8.0   Glucose, UA 100 (A) NEGATIVE mg/dL   Hgb urine dipstick LARGE (A) NEGATIVE   Bilirubin Urine SMALL (A) NEGATIVE   Ketones, ur NEGATIVE NEGATIVE mg/dL   Protein, ur 100 (A) NEGATIVE mg/dL   Nitrite POSITIVE (A) NEGATIVE   Leukocytes, UA LARGE (A) NEGATIVE  Urine microscopic-add on     Status: Abnormal   Collection Time: 01/14/16  9:42 PM  Result Value Ref Range   Squamous Epithelial / LPF 0-5 (A) NONE SEEN   WBC, UA TOO NUMEROUS TO COUNT 0 - 5 WBC/hpf   RBC / HPF TOO NUMEROUS TO COUNT 0 - 5 RBC/hpf   Bacteria, UA MANY (A) NONE SEEN    Ct Abdomen Pelvis Wo Contrast  01/14/2016  CLINICAL DATA:  70 year old female with history of abdominal pain most severe in the right lower quadrant. Three episodes of emesis today. EXAM: CT ABDOMEN AND PELVIS WITHOUT CONTRAST TECHNIQUE: Multidetector CT imaging of the abdomen and pelvis was performed following the standard protocol without IV contrast. COMPARISON:  CT the abdomen and pelvis 01/06/2016. FINDINGS: Lower chest: Airspace consolidation in the lung bases bilaterally, most severe in the left lower lobe, concerning for sequela  of recent aspiration. Severe calcifications of the mitral valve and mitral annulus. Atherosclerotic calcifications in the right coronary artery. Hepatobiliary: No definite cystic or solid hepatic lesions are noted on today's noncontrast CT examination. Multiple small calcified gallstones lying dependently in the gallbladder. No evidence to suggest an acute cholecystitis at this time. Pancreas: No definite pancreatic mass or peripancreatic inflammatory changes on today's noncontrast CT examination. Spleen: Unremarkable. Adrenals/Urinary Tract: Left kidney is normal in appearance. Calcification in the medial limb of the left adrenal gland likely related to remote hemorrhage or infection. 2.8 x 1.6 cm indeterminate right adrenal nodule is similar to remote prior study 05/23/2013, favored to represent a benign lesion such as a lipid poor adenoma. Right kidney is remarkable for apparent urothelial thickening and slight hydroureteronephrosis. There are no calcifications identified within the collecting system of the right kidney, along the course of the right ureter or within the lumen of the urinary bladder. No right-sided hydroureter. Urinary bladder is nearly completely decompressed, but otherwise unremarkable in appearance. Stomach/Bowel: The appearance of the stomach is unremarkable. Multiple dilated loops of small bowel are noted measuring up to 5.6 cm in diameter, and there multiple air-fluid levels noted. This affects predominantly the proximal and mid small bowel extending to the level of the proximal ileum, where there is a small ventral hernia containing a short segment of the proximal ileum, beyond which the distal small  bowel appears completely decompressed. Colon is relatively decompressed as well. Numerous colonic diverticulae are noted, without surrounding inflammatory changes to suggest an acute diverticulitis at this time. Vascular/Lymphatic: Atherosclerosis throughout the abdominal and pelvic  vasculature, without evidence of aneurysm or dissection. No lymphadenopathy noted in the abdomen or pelvis on today's noncontrast CT examination. Reproductive: Uterus and ovaries are atrophic. Other: No significant volume of ascites.  No pneumoperitoneum. Musculoskeletal: Extensive skin thickening in the subcutaneous fat of the patient's large pannus, with there multiple dystrophic calcifications. These findings appear to be chronic and are similar to the prior study. There are no aggressive appearing lytic or blastic lesions noted in the visualized portions of the skeleton. IMPRESSION: 1. Small bowel obstruction related to entrapment of proximal ileum in a small ventral hernia. Proximal small bowel is dilated with multiple air-fluid levels measuring up to 5.6 cm in diameter. Distal small bowel is completely decompressed, and colon is largely decompressed as well. Emergent surgical consultation is strongly recommended. 2. Extensive airspace disease in the lung bases bilaterally (left greater than right), concerning for potential aspiration pneumonia. 3. Cholelithiasis without evidence of acute cholecystitis at this time. 4. Chronic mild right hydronephrosis with urothelial thickening in the right renal collecting system, poorly evaluated on today's noncontrast CT examination, but similar in retrospect to prior studies. Nonemergent Urology consultation is recommended for further evaluation. 5. Atherosclerosis, including right coronary artery disease. 6. There are calcifications of the mitral valve and mitral annulus. Echocardiographic correlation for evaluation of potential valvular dysfunction may be warranted if clinically indicated. 7. Additional incidental findings, as above. Electronically Signed   By: Vinnie Langton M.D.   On: 01/14/2016 22:13    Review of Systems  Constitutional: Positive for malaise/fatigue. Negative for fever and chills.  Respiratory: Positive for shortness of breath (with exertion).    Cardiovascular: Negative for chest pain and palpitations.  Gastrointestinal: Positive for nausea, vomiting, abdominal pain and constipation. Negative for blood in stool and melena.  Genitourinary: Negative.    Blood pressure 107/56, pulse 97, temperature 98.6 F (37 C), temperature source Oral, resp. rate 18, SpO2 91 %. Physical Exam General: Alert, morbidly obese Caucasian female in no acute distress Skin: Generally warm and dry.  Monilial rash under a large pannus. HEENT: No palpable masses or thyromegaly. Sclera nonicteric. Pupils equal round and reactive. Oropharynx clear. Lymph nodes: No cervical, supraclavicular, nodes palpable Lungs: Breath sounds clear and equal without increased work of breathing Cardiovascular: Regular rate and rhythm without murmur. Bilateral 2+ lower extremity edema Abdomen: marked obesity. Long midline incision with depressed scar.Large pannus. In the lower abdomen on either side of the midline are palpable hernias with tenderness on the right side and a tender palpable mass to deep palpation. There is bruising over either side of her pannus which is where she injects insulin. Extremities: 2+ edema bilateral lower extremities. Mild to moderate cellulitis involving left lower extremity from upper calf down Neurologic: Alert and fully oriented. No gross motor deficits. Affect normal.  Assessment/Plan: Complex ventral incisional hernia with  Recurrent or persistent bowel obstruction status post discharge yesterday. History of pancreatic debridement. Acute renal insufficiency secondary to this. Marked morbid obesity. Diabetes mellitus. Gallstones. Hypertension. Hyperlipidemia. Stable adrenal mass. Chronic UTI. Lower extremity cellulitis.  Patient is being admitted. I have ordered an NG decompression. IV hydration already ordered. I'm not sure she ever really resolved her small bowel obstruction from her hospitalization one week ago. Surgical issues are very difficult  due to multiple factors as above. She  may however require surgical intervention this hospitalization.  This possibly would have to be a limited procedure just to relieve her obstruction with plans for more extensive repair once her bowel obstruction and other infectious issues could be addressed.  Markell Sciascia T 01/14/2016, 11:39 PM

## 2016-01-14 NOTE — ED Notes (Signed)
Pt stated she is unable to give a urine sample at this time, but will try in a little while.

## 2016-01-15 ENCOUNTER — Encounter (HOSPITAL_COMMUNITY): Payer: Self-pay | Admitting: Internal Medicine

## 2016-01-15 ENCOUNTER — Inpatient Hospital Stay (HOSPITAL_COMMUNITY): Payer: Medicare Other | Admitting: Anesthesiology

## 2016-01-15 ENCOUNTER — Encounter (HOSPITAL_COMMUNITY)
Admission: EM | Disposition: A | Discharge status: Skilled Nursing Facility | Payer: Self-pay | Source: Home / Self Care | Attending: Internal Medicine

## 2016-01-15 DIAGNOSIS — I959 Hypotension, unspecified: Secondary | ICD-10-CM

## 2016-01-15 HISTORY — PX: LAPAROTOMY: SHX154

## 2016-01-15 LAB — CBC WITH DIFFERENTIAL/PLATELET
BASOS ABS: 0 10*3/uL (ref 0.0–0.1)
Basophils Relative: 0 %
EOS ABS: 0.1 10*3/uL (ref 0.0–0.7)
EOS PCT: 1 %
HCT: 31.3 % — ABNORMAL LOW (ref 36.0–46.0)
Hemoglobin: 9.7 g/dL — ABNORMAL LOW (ref 12.0–15.0)
LYMPHS ABS: 0.6 10*3/uL — AB (ref 0.7–4.0)
LYMPHS PCT: 9 %
MCH: 28.8 pg (ref 26.0–34.0)
MCHC: 31 g/dL (ref 30.0–36.0)
MCV: 92.9 fL (ref 78.0–100.0)
MONO ABS: 0.3 10*3/uL (ref 0.1–1.0)
Monocytes Relative: 5 %
Neutro Abs: 6 10*3/uL (ref 1.7–7.7)
Neutrophils Relative %: 85 %
PLATELETS: 225 10*3/uL (ref 150–400)
RBC: 3.37 MIL/uL — AB (ref 3.87–5.11)
RDW: 14.7 % (ref 11.5–15.5)
WBC: 7 10*3/uL (ref 4.0–10.5)

## 2016-01-15 LAB — BASIC METABOLIC PANEL
Anion gap: 12 (ref 5–15)
BUN: 34 mg/dL — AB (ref 6–20)
CHLORIDE: 104 mmol/L (ref 101–111)
CO2: 22 mmol/L (ref 22–32)
CREATININE: 1.56 mg/dL — AB (ref 0.44–1.00)
Calcium: 8.2 mg/dL — ABNORMAL LOW (ref 8.9–10.3)
GFR calc Af Amer: 37 mL/min — ABNORMAL LOW (ref >60)
GFR calc non Af Amer: 32 mL/min — ABNORMAL LOW (ref >60)
GLUCOSE: 281 mg/dL — AB (ref 65–99)
Potassium: 4.1 mmol/L (ref 3.5–5.1)
Sodium: 138 mmol/L (ref 135–145)

## 2016-01-15 LAB — CBC
HEMATOCRIT: 30.6 % — AB (ref 36.0–46.0)
HEMOGLOBIN: 9.5 g/dL — AB (ref 12.0–15.0)
MCH: 28.7 pg (ref 26.0–34.0)
MCHC: 31 g/dL (ref 30.0–36.0)
MCV: 92.4 fL (ref 78.0–100.0)
Platelets: 277 10*3/uL (ref 150–400)
RBC: 3.31 MIL/uL — ABNORMAL LOW (ref 3.87–5.11)
RDW: 14.6 % (ref 11.5–15.5)
WBC: 12.3 10*3/uL — ABNORMAL HIGH (ref 4.0–10.5)

## 2016-01-15 LAB — PROCALCITONIN: PROCALCITONIN: 1.7 ng/mL

## 2016-01-15 LAB — COMPREHENSIVE METABOLIC PANEL
ALBUMIN: 2.7 g/dL — AB (ref 3.5–5.0)
ALT: 13 U/L — ABNORMAL LOW (ref 14–54)
ANION GAP: 16 — AB (ref 5–15)
AST: 16 U/L (ref 15–41)
Alkaline Phosphatase: 61 U/L (ref 38–126)
BUN: 35 mg/dL — ABNORMAL HIGH (ref 6–20)
CHLORIDE: 97 mmol/L — AB (ref 101–111)
CO2: 23 mmol/L (ref 22–32)
Calcium: 8.8 mg/dL — ABNORMAL LOW (ref 8.9–10.3)
Creatinine, Ser: 2.4 mg/dL — ABNORMAL HIGH (ref 0.44–1.00)
GFR calc non Af Amer: 19 mL/min — ABNORMAL LOW (ref >60)
GFR, EST AFRICAN AMERICAN: 22 mL/min — AB (ref >60)
Glucose, Bld: 292 mg/dL — ABNORMAL HIGH (ref 65–99)
POTASSIUM: 3.9 mmol/L (ref 3.5–5.1)
SODIUM: 136 mmol/L (ref 135–145)
TOTAL PROTEIN: 6.5 g/dL (ref 6.5–8.1)
Total Bilirubin: 1.1 mg/dL (ref 0.3–1.2)

## 2016-01-15 LAB — LACTIC ACID, PLASMA: LACTIC ACID, VENOUS: 1.5 mmol/L (ref 0.5–2.0)

## 2016-01-15 LAB — TYPE AND SCREEN
ABO/RH(D): O POS
ANTIBODY SCREEN: NEGATIVE

## 2016-01-15 LAB — GLUCOSE, CAPILLARY
GLUCOSE-CAPILLARY: 244 mg/dL — AB (ref 65–99)
GLUCOSE-CAPILLARY: 228 mg/dL — AB (ref 65–99)
GLUCOSE-CAPILLARY: 290 mg/dL — AB (ref 65–99)
Glucose-Capillary: 186 mg/dL — ABNORMAL HIGH (ref 65–99)
Glucose-Capillary: 202 mg/dL — ABNORMAL HIGH (ref 65–99)
Glucose-Capillary: 222 mg/dL — ABNORMAL HIGH (ref 65–99)
Glucose-Capillary: 223 mg/dL — ABNORMAL HIGH (ref 65–99)
Glucose-Capillary: 237 mg/dL — ABNORMAL HIGH (ref 65–99)

## 2016-01-15 LAB — ABO/RH: ABO/RH(D): O POS

## 2016-01-15 LAB — SURGICAL PCR SCREEN
MRSA, PCR: NEGATIVE
Staphylococcus aureus: NEGATIVE

## 2016-01-15 SURGERY — LAPAROTOMY, EXPLORATORY
Anesthesia: General | Site: Abdomen

## 2016-01-15 MED ORDER — HYDROMORPHONE HCL 1 MG/ML IJ SOLN
0.2500 mg | INTRAMUSCULAR | Status: DC | PRN
  Administered 2016-01-15 (×4): 0.5 mg via INTRAVENOUS

## 2016-01-15 MED ORDER — HYDROMORPHONE HCL 1 MG/ML IJ SOLN
INTRAMUSCULAR | Status: AC
  Filled 2016-01-15: qty 1

## 2016-01-15 MED ORDER — ROCURONIUM BROMIDE 100 MG/10ML IV SOLN
INTRAVENOUS | Status: DC | PRN
  Administered 2016-01-15: 40 mg via INTRAVENOUS
  Administered 2016-01-15 (×3): 10 mg via INTRAVENOUS

## 2016-01-15 MED ORDER — KETAMINE HCL 10 MG/ML IJ SOLN
INTRAMUSCULAR | Status: DC | PRN
  Administered 2016-01-15 (×2): 50 mg via INTRAVENOUS

## 2016-01-15 MED ORDER — DEXAMETHASONE SODIUM PHOSPHATE 10 MG/ML IJ SOLN
INTRAMUSCULAR | Status: AC
  Filled 2016-01-15: qty 1

## 2016-01-15 MED ORDER — SODIUM CHLORIDE 0.9 % IJ SOLN
INTRAMUSCULAR | Status: AC
  Filled 2016-01-15: qty 10

## 2016-01-15 MED ORDER — LIDOCAINE HCL (CARDIAC) 20 MG/ML IV SOLN
INTRAVENOUS | Status: AC
  Filled 2016-01-15: qty 5

## 2016-01-15 MED ORDER — SUGAMMADEX SODIUM 500 MG/5ML IV SOLN
INTRAVENOUS | Status: AC
  Filled 2016-01-15: qty 5

## 2016-01-15 MED ORDER — KETAMINE HCL 10 MG/ML IJ SOLN
INTRAMUSCULAR | Status: AC
  Filled 2016-01-15: qty 1

## 2016-01-15 MED ORDER — DEXTROSE 5 % IV SOLN: 1.0000 g | INTRAVENOUS | Status: DC

## 2016-01-15 MED ORDER — ONDANSETRON HCL 4 MG/2ML IJ SOLN
INTRAMUSCULAR | Status: AC
  Filled 2016-01-15: qty 2

## 2016-01-15 MED ORDER — SUGAMMADEX SODIUM 200 MG/2ML IV SOLN
INTRAVENOUS | Status: DC | PRN
  Administered 2016-01-15: 300 mg via INTRAVENOUS

## 2016-01-15 MED ORDER — PIPERACILLIN-TAZOBACTAM 3.375 G IVPB
3.3750 g | Freq: Three times a day (TID) | INTRAVENOUS | Status: DC
  Administered 2016-01-15 – 2016-01-17 (×7): 3.375 g via INTRAVENOUS
  Filled 2016-01-15 (×7): qty 50

## 2016-01-15 MED ORDER — VITAMINS A & D EX OINT
TOPICAL_OINTMENT | CUTANEOUS | Status: AC
  Administered 2016-01-15: 20:00:00 via TOPICAL
  Filled 2016-01-15: qty 5

## 2016-01-15 MED ORDER — FLUTICASONE PROPIONATE 50 MCG/ACT NA SUSP
2.0000 | Freq: Every day | NASAL | Status: DC
  Administered 2016-01-19 – 2016-01-30 (×11): 2 via NASAL
  Filled 2016-01-15 (×2): qty 16

## 2016-01-15 MED ORDER — PHENOL 1.4 % MT LIQD
1.0000 | OROMUCOSAL | Status: DC | PRN
  Filled 2016-01-15: qty 177

## 2016-01-15 MED ORDER — ACETAMINOPHEN 10 MG/ML IV SOLN
INTRAVENOUS | Status: AC
  Filled 2016-01-15: qty 100

## 2016-01-15 MED ORDER — CEFOTETAN DISODIUM 2 G IJ SOLR: 2.0000 g | INTRAMUSCULAR | Status: DC

## 2016-01-15 MED ORDER — SODIUM CHLORIDE 0.9 % IV BOLUS (SEPSIS)
500.0000 mL | Freq: Once | INTRAVENOUS | Status: AC
  Administered 2016-01-15: 500 mL via INTRAVENOUS

## 2016-01-15 MED ORDER — ONDANSETRON HCL 4 MG PO TABS: 4.0000 mg | ORAL_TABLET | Freq: Four times a day (QID) | ORAL | Status: DC | PRN

## 2016-01-15 MED ORDER — MORPHINE SULFATE (PF) 2 MG/ML IV SOLN
1.0000 mg | INTRAVENOUS | Status: DC | PRN
  Administered 2016-01-15: 1 mg via INTRAVENOUS
  Filled 2016-01-15: qty 1

## 2016-01-15 MED ORDER — SODIUM CHLORIDE 0.9 % IV SOLN
INTRAVENOUS | Status: DC
Start: 2016-01-15 — End: 2016-01-15
  Administered 2016-01-15: 01:00:00 via INTRAVENOUS

## 2016-01-15 MED ORDER — PANTOPRAZOLE SODIUM 40 MG IV SOLR
40.0000 mg | Freq: Every day | INTRAVENOUS | Status: DC
  Administered 2016-01-15 – 2016-01-21 (×7): 40 mg via INTRAVENOUS
  Filled 2016-01-15 (×7): qty 40

## 2016-01-15 MED ORDER — ACETAMINOPHEN 650 MG RE SUPP: 650.0000 mg | Freq: Four times a day (QID) | RECTAL | Status: DC | PRN

## 2016-01-15 MED ORDER — CHLORHEXIDINE GLUCONATE 0.12% ORAL RINSE (MEDLINE KIT)
15.0000 mL | Freq: Four times a day (QID) | OROMUCOSAL | Status: DC
  Administered 2016-01-15 – 2016-01-20 (×16): 15 mL via OROMUCOSAL

## 2016-01-15 MED ORDER — SODIUM CHLORIDE 0.9 % IV SOLN
INTRAVENOUS | Status: DC
  Administered 2016-01-15 (×2): via INTRAVENOUS
  Filled 2016-01-15 (×8): qty 2000

## 2016-01-15 MED ORDER — SODIUM CHLORIDE 0.9 % IV SOLN
INTRAVENOUS | Status: DC
  Administered 2016-01-15: 18:00:00 via INTRAVENOUS
  Administered 2016-01-16: 150 mL/h via INTRAVENOUS
  Administered 2016-01-17 – 2016-01-19 (×4): via INTRAVENOUS
  Administered 2016-01-21: 10 mL/h via INTRAVENOUS

## 2016-01-15 MED ORDER — ONDANSETRON HCL 4 MG/2ML IJ SOLN
4.0000 mg | Freq: Four times a day (QID) | INTRAMUSCULAR | Status: DC | PRN
  Administered 2016-01-15: 4 mg via INTRAVENOUS
  Filled 2016-01-15: qty 2

## 2016-01-15 MED ORDER — ONDANSETRON HCL 4 MG/2ML IJ SOLN: 4.0000 mg | Freq: Four times a day (QID) | INTRAMUSCULAR | Status: DC | PRN

## 2016-01-15 MED ORDER — SUCCINYLCHOLINE CHLORIDE 20 MG/ML IJ SOLN
INTRAMUSCULAR | Status: DC | PRN
  Administered 2016-01-15: 140 mg via INTRAVENOUS

## 2016-01-15 MED ORDER — ONDANSETRON 4 MG PO TBDP
4.0000 mg | ORAL_TABLET | Freq: Four times a day (QID) | ORAL | Status: DC | PRN
  Filled 2016-01-15: qty 1

## 2016-01-15 MED ORDER — PHENYLEPHRINE HCL 10 MG/ML IJ SOLN
INTRAMUSCULAR | Status: AC
  Filled 2016-01-15: qty 1

## 2016-01-15 MED ORDER — SODIUM CHLORIDE 0.9 % IR SOLN
Status: DC | PRN
  Administered 2016-01-15: 1000 mL
  Administered 2016-01-15: 3000 mL

## 2016-01-15 MED ORDER — PROPOFOL 10 MG/ML IV BOLUS
INTRAVENOUS | Status: DC | PRN
  Administered 2016-01-15: 200 mg via INTRAVENOUS

## 2016-01-15 MED ORDER — ROCURONIUM BROMIDE 100 MG/10ML IV SOLN
INTRAVENOUS | Status: AC
Start: 2016-01-15 — End: 2016-01-15
  Filled 2016-01-15: qty 1

## 2016-01-15 MED ORDER — ONDANSETRON HCL 4 MG/2ML IJ SOLN
INTRAMUSCULAR | Status: DC | PRN
  Administered 2016-01-15: 4 mg via INTRAVENOUS

## 2016-01-15 MED ORDER — PIPERACILLIN-TAZOBACTAM 3.375 G IVPB
INTRAVENOUS | Status: AC
  Filled 2016-01-15: qty 50

## 2016-01-15 MED ORDER — LACTATED RINGERS IV SOLN
INTRAVENOUS | Status: DC | PRN
  Administered 2016-01-15 (×3): via INTRAVENOUS

## 2016-01-15 MED ORDER — PROMETHAZINE HCL 25 MG/ML IJ SOLN: 6.2500 mg | INTRAMUSCULAR | Status: DC | PRN

## 2016-01-15 MED ORDER — ACETAMINOPHEN 10 MG/ML IV SOLN
1000.0000 mg | Freq: Once | INTRAVENOUS | Status: AC
  Administered 2016-01-15: 1000 mg via INTRAVENOUS

## 2016-01-15 MED ORDER — FENTANYL CITRATE (PF) 250 MCG/5ML IJ SOLN
INTRAMUSCULAR | Status: AC
  Filled 2016-01-15: qty 5

## 2016-01-15 MED ORDER — FENTANYL CITRATE (PF) 100 MCG/2ML IJ SOLN
INTRAMUSCULAR | Status: DC | PRN
  Administered 2016-01-15 (×2): 100 ug via INTRAVENOUS
  Administered 2016-01-15: 50 ug via INTRAVENOUS

## 2016-01-15 MED ORDER — VANCOMYCIN HCL 10 G IV SOLR: 1750.0000 mg | INTRAVENOUS | Status: DC

## 2016-01-15 MED ORDER — HEPARIN SODIUM (PORCINE) 5000 UNIT/ML IJ SOLN
5000.0000 [IU] | Freq: Three times a day (TID) | INTRAMUSCULAR | Status: DC
  Administered 2016-01-16 – 2016-01-30 (×43): 5000 [IU] via SUBCUTANEOUS
  Filled 2016-01-15 (×44): qty 1

## 2016-01-15 MED ORDER — PHENYLEPHRINE HCL 10 MG/ML IJ SOLN
10.0000 mg | INTRAMUSCULAR | Status: DC | PRN
  Administered 2016-01-15: 25 ug/min via INTRAVENOUS

## 2016-01-15 MED ORDER — VANCOMYCIN HCL 10 G IV SOLR
2500.0000 mg | Freq: Once | INTRAVENOUS | Status: AC
  Administered 2016-01-15: 2500 mg via INTRAVENOUS
  Filled 2016-01-15: qty 2000

## 2016-01-15 MED ORDER — LIDOCAINE HCL (CARDIAC) 20 MG/ML IV SOLN
INTRAVENOUS | Status: DC | PRN
  Administered 2016-01-15: 100 mg via INTRAVENOUS

## 2016-01-15 MED ORDER — PROPOFOL 10 MG/ML IV BOLUS
INTRAVENOUS | Status: AC
  Filled 2016-01-15: qty 20

## 2016-01-15 MED ORDER — HYDRALAZINE HCL 20 MG/ML IJ SOLN
10.0000 mg | INTRAMUSCULAR | Status: DC | PRN
  Administered 2016-01-17 – 2016-01-18 (×2): 10 mg via INTRAVENOUS
  Filled 2016-01-15 (×2): qty 1

## 2016-01-15 MED ORDER — ACETAMINOPHEN 325 MG PO TABS: 650.0000 mg | ORAL_TABLET | Freq: Four times a day (QID) | ORAL | Status: DC | PRN

## 2016-01-15 MED ORDER — HYDROMORPHONE HCL 1 MG/ML IJ SOLN
1.0000 mg | INTRAMUSCULAR | Status: DC | PRN
  Administered 2016-01-15 – 2016-01-22 (×57): 1 mg via INTRAVENOUS
  Filled 2016-01-15 (×57): qty 1

## 2016-01-15 MED ORDER — INSULIN GLARGINE 100 UNIT/ML ~~LOC~~ SOLN
5.0000 [IU] | Freq: Every day | SUBCUTANEOUS | Status: DC
  Administered 2016-01-15 (×2): 5 [IU] via SUBCUTANEOUS
  Filled 2016-01-15 (×3): qty 0.05

## 2016-01-15 MED ORDER — ARTIFICIAL TEARS OP OINT
TOPICAL_OINTMENT | OPHTHALMIC | Status: AC
  Filled 2016-01-15: qty 3.5

## 2016-01-15 MED ORDER — INSULIN ASPART 100 UNIT/ML ~~LOC~~ SOLN
0.0000 [IU] | SUBCUTANEOUS | Status: DC
  Administered 2016-01-15 (×5): 3 [IU] via SUBCUTANEOUS
  Administered 2016-01-15: 5 [IU] via SUBCUTANEOUS
  Administered 2016-01-16 (×2): 3 [IU] via SUBCUTANEOUS

## 2016-01-15 SURGICAL SUPPLY — 33 items
APPLICATOR COTTON TIP 6IN STRL (MISCELLANEOUS) ×3 IMPLANT
BLADE EXTENDED COATED 6.5IN (ELECTRODE) ×3 IMPLANT
BLADE HEX COATED 2.75 (ELECTRODE) ×3 IMPLANT
COVER MAYO STAND STRL (DRAPES) ×3 IMPLANT
COVER SURGICAL LIGHT HANDLE (MISCELLANEOUS) ×3 IMPLANT
DRAPE LAPAROSCOPIC ABDOMINAL (DRAPES) ×3 IMPLANT
DRAPE WARM FLUID 44X44 (DRAPE) ×3 IMPLANT
DRSG VAC ATS LRG SENSATRAC (GAUZE/BANDAGES/DRESSINGS) ×6 IMPLANT
ELECT REM PT RETURN 9FT ADLT (ELECTROSURGICAL) ×3
ELECTRODE REM PT RTRN 9FT ADLT (ELECTROSURGICAL) ×1 IMPLANT
GAUZE SPONGE 4X4 12PLY STRL (GAUZE/BANDAGES/DRESSINGS) ×3 IMPLANT
GLOVE BIOGEL PI IND STRL 7.0 (GLOVE) ×1 IMPLANT
GLOVE BIOGEL PI INDICATOR 7.0 (GLOVE) ×2
GLOVE EUDERMIC 7 POWDERFREE (GLOVE) ×3 IMPLANT
GOWN STRL REUS W/TWL LRG LVL3 (GOWN DISPOSABLE) ×3 IMPLANT
GOWN STRL REUS W/TWL XL LVL3 (GOWN DISPOSABLE) ×6 IMPLANT
GRAFT FLEX HD 20X25 THICK (Tissue Mesh) ×3 IMPLANT
HANDLE SUCTION POOLE (INSTRUMENTS) IMPLANT
KIT BASIN OR (CUSTOM PROCEDURE TRAY) ×3 IMPLANT
NS IRRIG 1000ML POUR BTL (IV SOLUTION) ×3 IMPLANT
PACK GENERAL/GYN (CUSTOM PROCEDURE TRAY) ×3 IMPLANT
RETAINER VISCERA MED (MISCELLANEOUS) ×3 IMPLANT
SPONGE LAP 18X18 X RAY DECT (DISPOSABLE) ×9 IMPLANT
STAPLER VISISTAT 35W (STAPLE) ×3 IMPLANT
SUCTION POOLE HANDLE (INSTRUMENTS)
SUT NOVA 1 T20/GS 25DT (SUTURE) ×15 IMPLANT
SUT SILK 2 0 SH CR/8 (SUTURE) ×3 IMPLANT
SUT SILK 3 0 SH CR/8 (SUTURE) ×3 IMPLANT
SUT VIC AB 2-0 SH 18 (SUTURE) ×3 IMPLANT
TOWEL OR 17X26 10 PK STRL BLUE (TOWEL DISPOSABLE) ×6 IMPLANT
TRAY FOLEY W/METER SILVER 14FR (SET/KITS/TRAYS/PACK) ×3 IMPLANT
TRAY FOLEY W/METER SILVER 16FR (SET/KITS/TRAYS/PACK) IMPLANT
YANKAUER SUCT BULB TIP NO VENT (SUCTIONS) ×3 IMPLANT

## 2016-01-15 NOTE — Progress Notes (Signed)
ANTIBIOTIC CONSULT NOTE - INITIAL  Pharmacy Consult for Zosyn/Vancomycin Indication: Intra-abdominal infection/Cellulitis  No Known Allergies  Patient Measurements:   Wt=126 kg  Vital Signs: Temp: 98.8 F (37.1 C) (01/18 0030) Temp Source: Oral (01/18 0030) BP: 132/49 mmHg (01/18 0030) Pulse Rate: 52 (01/18 0030) Intake/Output from previous day:   Intake/Output from this shift:    Labs:  Recent Labs  01/12/16 0532 01/13/16 0415 01/14/16 2029  WBC 5.8 7.0 9.1  HGB 9.5* 9.9* 10.3*  PLT 232 251 254  CREATININE 0.69 0.73 2.15*   Estimated Creatinine Clearance: 31.6 mL/min (by C-G formula based on Cr of 2.15). No results for input(s): VANCOTROUGH, VANCOPEAK, VANCORANDOM, GENTTROUGH, GENTPEAK, GENTRANDOM, TOBRATROUGH, TOBRAPEAK, TOBRARND, AMIKACINPEAK, AMIKACINTROU, AMIKACIN in the last 72 hours.   Microbiology: Recent Results (from the past 720 hour(s))  Urine culture     Status: None   Collection Time: 01/05/16  1:30 PM  Result Value Ref Range Status   Specimen Description URINE, CLEAN CATCH  Final   Special Requests none Normal  Final   Culture   Final    >=100,000 COLONIES/mL KLEBSIELLA PNEUMONIAE Performed at [facility]    Report Status 01/07/2016 FINAL  Final   Organism ID, Bacteria KLEBSIELLA PNEUMONIAE  Final      Susceptibility   Klebsiella pneumoniae - MIC*    AMPICILLIN >=32 RESISTANT Resistant     CEFAZOLIN <=4 SENSITIVE Sensitive     CEFTRIAXONE <=1 SENSITIVE Sensitive     CIPROFLOXACIN <=0.25 SENSITIVE Sensitive     GENTAMICIN <=1 SENSITIVE Sensitive     IMIPENEM <=0.25 SENSITIVE Sensitive     NITROFURANTOIN 64 INTERMEDIATE Intermediate     TRIMETH/SULFA <=20 SENSITIVE Sensitive     AMPICILLIN/SULBACTAM 8 SENSITIVE Sensitive     PIP/TAZO <=4 SENSITIVE Sensitive     * >=100,000 COLONIES/mL KLEBSIELLA PNEUMONIAE  Blood culture (routine x 2)     Status: None   Collection Time: 01/06/16  3:13 PM  Result Value Ref Range Status   Specimen  Description BLOOD LEFT ANTECUBITAL  Final   Special Requests BOTTLES DRAWN AEROBIC AND ANAEROBIC  Final   Culture   Final    NO GROWTH 5 DAYS Performed at [facility]    Report Status 01/11/2016 FINAL  Final  Blood culture (routine x 2)     Status: None   Collection Time: 01/06/16  3:14 PM  Result Value Ref Range Status   Specimen Description BLOOD RIGHT FOREARM  Final   Special Requests BOTTLES DRAWN AEROBIC ONLY  Final   Culture   Final    NO GROWTH 5 DAYS Performed at [facility]    Report Status 01/11/2016 FINAL  Final    Medical History: Past Medical History  Diagnosis Date  . Diabetes mellitus without complication (H[facility])   . Hypertension   . Hyperlipidemia   . Seasonal allergies   . Chronic UTI   . GERD (gastroesophageal reflux disease)   . Necrotizing pancreatitis     Medications:  Prescriptions prior to admission  Medication Sig Dispense Refill Last Dose  . acetaminophen (TYLENOL EX ST ARTHRITIS PAIN) 500 MG tablet Take 500 mg by mouth every 6 (six) hours as needed for mild pain or moderate pain.    01/14/2016 at Unknown time  . amLODipine (NORVASC) 10 MG tablet TAKE 1 TABLET BY MOUTH EVERY DAY 90 tablet 0 Past Week at Unknown time  . atorvastatin (LIPITOR) 10 MG tablet TAKE 1 TABLET BY MOUTH DAILY 90 tablet  0 Past Week at Unknown time  . ciprofloxacin (CIPRO) 500 MG tablet Take 1 tablet (500 mg total) by mouth 2 (two) times daily. 6 tablet 0 01/14/2016 at Unknown time  . CRANBERRY PO Take 1 tablet by mouth 2 (two) times daily.   Past Week at Unknown time  . doxycycline (VIBRA-TABS) 100 MG tablet Take 1 tablet (100 mg total) by mouth every 12 (twelve) hours. 20 tablet 0 01/14/2016 at Unknown time  . fenofibrate 160 MG tablet TAKE 1 TABLET BY MOUTH DAILY 90 tablet 0 Past Week at Unknown time  . Fexofenadine HCl (ALLEGRA PO) Take 1 tablet by mouth daily as needed (allergies).    Past Week at Unknown time  . hydrocerin (EUCERIN) CREA Apply 1  application topically 2 (two) times daily. 113 g 0 01/14/2016 at Unknown time  . insulin regular human CONCENTRATED (HUMULIN R) 500 UNIT/ML injection INJECT 44 UNITS AS MARKED ON THE INSULIN SYRINGE UNDER THE SKIN THREE TIMES DAILY (Patient taking differently: Inject 20-40 Units into the skin 3 (three) times daily with meals as needed (for high blood sugar.). 150 or above take 20 units, 200 or above take 30 units, if 300 or above take 40units.) 40 mL 3 01/14/2016 at Unknown time  . mirabegron ER (MYRBETRIQ) 50 MG TB24 tablet Take 50 mg by mouth daily.   Past Week at Unknown time  . mometasone (NASONEX) 50 MCG/ACT nasal spray Place 2 sprays into the nose daily as needed (allergies).   Past Week at Unknown time  . naproxen sodium (ANAPROX) 220 MG tablet Take 440 mg by mouth 2 (two) times daily as needed (pain).   01/14/2016 at Unknown time  . Omega-3 Fatty Acids (FISH OIL) 1000 MG CAPS Take 1 capsule by mouth daily.    Past Week at Unknown time  . omeprazole (PRILOSEC) 20 MG capsule Take 20 mg by mouth daily.   Past Week at Unknown time  . ondansetron (ZOFRAN ODT) 4 MG disintegrating tablet Take 1 tablet (4 mg total) by mouth every 8 (eight) hours as needed for nausea. 6 tablet 0 01/14/2016 at Unknown time  . solifenacin (VESICARE) 10 MG tablet Take 10 mg by mouth daily.   Past Week at Unknown time  . trimethoprim (TRIMPEX) 100 MG tablet Take 100 mg by mouth daily.    Past Week at Unknown time  . glucose blood (ONE TOUCH ULTRA TEST) test strip TEST EIGHT TIMES DAILY 750 each 1 Past Week at Unknown time  . Insulin Pen Needle (NOVOTWIST) 32G X 5 MM MISC Use to inject victoza daily 50 each 0 Past Week at Unknown time  . ONE TOUCH LANCETS MISC Use to check blood sugars 8 times daily 300 each 5 Past Week at Unknown time   Scheduled:  . fluticasone  2 spray Each Nare Daily  . insulin aspart  0-9 Units Subcutaneous Q4H  . insulin glargine  5 Units Subcutaneous QHS  . piperacillin-tazobactam (ZOSYN)  IV  3.375 g  Intravenous 3 times per day  . vancomycin  2,500 mg Intravenous Once   Infusions:  . sodium chloride     Assessment: 46 yoF [facility] SBO.  Zosyn per Rx for intra-abdominal infection and Vancomycin for cellulitis.   Goal of Therapy:  Vancomycin trough level 10-15 mcg/ml  Plan:   Zosyn 3.375 Gm IV q8h EI  Vancomycin  x1 then  IV q48h (CrCl~27 (N))  F/u SCr/cultures/levels as needed  Susanne Greenhouse R 01/15/2016,12:58 AM

## 2016-01-15 NOTE — Anesthesia Preprocedure Evaluation (Addendum)
Anesthesia Evaluation  Patient identified by MRN, date of birth, ID band Patient awake    Reviewed: Allergy & Precautions, NPO status , Patient's Chart, lab work & pertinent test results  Airway Mallampati: II  TM Distance: >3 FB Neck ROM: Full    Dental  (+) Dental Advisory Given   Pulmonary former smoker,  Possible aspiration PNA   breath sounds clear to auscultation       Cardiovascular hypertension, Pt. on medications  Rhythm:Regular Rate:Normal  Hypotensive on admission in setting of UTI, SBO, and cellulitis   Neuro/Psych negative neurological ROS     GI/Hepatic GERD  ,SBO   Endo/Other  diabetes, Type 2, Insulin DependentMorbid obesity  Renal/GU ARFRenal disease     Musculoskeletal   Abdominal   Peds  Hematology  (+) anemia ,   Anesthesia Other Findings   Reproductive/Obstetrics                           Lab Results  Component Value Date   WBC 7.0 01/15/2016   HGB 9.7* 01/15/2016   HCT 31.3* 01/15/2016   MCV 92.9 01/15/2016   PLT 225 01/15/2016   Lab Results  Component Value Date   CREATININE 2.40* 01/15/2016   BUN 35* 01/15/2016   NA 136 01/15/2016   K 3.9 01/15/2016   CL 97* 01/15/2016   CO2 23 01/15/2016    Anesthesia Physical Anesthesia Plan  ASA: III and emergent  Anesthesia Plan: General   Post-op Pain Management:    Induction: Intravenous, Rapid sequence and Cricoid pressure planned  Airway Management Planned: Oral ETT  Additional Equipment:   Intra-op Plan:   Post-operative Plan: Extubation in OR and Possible Post-op intubation/ventilation  Informed Consent: I have reviewed the patients History and Physical, chart, labs and discussed the procedure including the risks, benefits and alternatives for the proposed anesthesia with the patient or authorized representative who has indicated his/her understanding and acceptance.   Dental advisory  given  Plan Discussed with: CRNA  Anesthesia Plan Comments:        Anesthesia Quick Evaluation

## 2016-01-15 NOTE — Anesthesia Postprocedure Evaluation (Signed)
Anesthesia Post Note  Patient: Valerie [name]  Procedure(s) Performed: Procedure(s) (LRB): EXPLORATORY LAPAROTOMY, LYSIS OF ADHESIONS, EXTENSIVE DEBRIDEMENT OF SKIN AND SUBCUTANEOUS TISSUE AND MUSCLE, REPAIR OF INCARCERATED INCISION HERNIA WITH BIOLOGIC MESH, APPLICATION OF NEGATIVE PRESSURE DRESSING (N/A)  Patient location during evaluation: PACU Anesthesia Type: General Level of consciousness: awake and alert Pain management: pain level controlled Vital Signs Assessment: post-procedure vital signs reviewed and stable Respiratory status: spontaneous breathing, nonlabored ventilation, respiratory function stable and patient connected to nasal cannula oxygen Cardiovascular status: blood pressure returned to baseline and stable Postop Assessment: no signs of nausea or vomiting Anesthetic complications: no Comments: Pain treated with multimodal therapy of tylenol and dilaudid, ketamine intraop.Marland Kitchen Respiratory status is excellent currently given clinical picture and starting RA sat of mid 80s prior to induction    Last Vitals:  Filed Vitals:   01/15/16 1654 01/15/16 1700  BP: 136/55 136/55  Pulse: 89 93  Temp:    Resp: 18 22    Last Pain:  Filed Vitals:   01/15/16 1704  PainSc: 10-Worst pain ever                 Reino Kent

## 2016-01-15 NOTE — Plan of Care (Signed)
Problem: Pain Managment: Goal: General experience of comfort will improve Outcome: Progressing Patient educated about the pain rating scale.  Pain levels are being monitored.  Orders are in place to provide control of her pain.

## 2016-01-15 NOTE — ED Provider Notes (Signed)
CSN: [id number]     Arrival date & time 01/14/16  1959 History   First MD Initiated Contact with Patient 01/14/16 2012     Chief Complaint  Patient presents with  . Abdominal Pain  . Emesis     (Consider location/radiation/quality/duration/timing/severity/associated sxs/prior Treatment) Patient is a 70 y.o. female presenting with abdominal pain and vomiting. The history is provided by the patient.  Abdominal Pain Pain location:  RLQ and R flank Pain quality: sharp and shooting   Pain radiates to:  Does not radiate Pain severity:  Severe Onset quality:  Gradual Duration:  1 day Timing:  Intermittent Progression:  Worsening Chronicity:  Recurrent Relieved by:  Vomiting Worsened by:  Nothing tried Ineffective treatments:  None tried Associated symptoms: nausea and vomiting   Associated symptoms: no chest pain, no chills, no dysuria, no fever and no shortness of breath   Risk factors: being elderly and multiple surgeries   Emesis Associated symptoms: abdominal pain   Associated symptoms: no arthralgias, no chills, no headaches and no myalgias     70 yo F with cc of abdominal pain.  Patient recently admitted for SBO.  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  Discharged home today able to eat a small meal, but then had persistent n/v.  Unable to keep anything down for past four hours.  Patient with worsening R sided abdominal pain as well. Denies fevers, chills.  Denies bloody or bilious emesis.  No flatus or BM in one week.   Past Medical History  Diagnosis Date  . Diabetes mellitus without complication (HCC)   . Hypertension   . Hyperlipidemia   . Seasonal allergies   . Chronic UTI   . GERD (gastroesophageal reflux disease)   . Necrotizing pancreatitis    Past Surgical History  Procedure Laterality Date  . Pancreas surgery  1985  . Cataract extraction w/ intraocular lens  implant, bilateral     Family History  Problem Relation Age of  Onset  . Cancer Father     Prostate cancer  . Cancer Mother     Gynecologic   Social History  Substance Use Topics  . Smoking status: Former Games developer  . Smokeless tobacco: None  . Alcohol Use: No   OB History    No data available     Review of Systems  Constitutional: Negative for fever and chills.  HENT: Negative for congestion and rhinorrhea.   Eyes: Negative for redness and visual disturbance.  Respiratory: Negative for shortness of breath and wheezing.   Cardiovascular: Negative for chest pain and palpitations.  Gastrointestinal: Positive for nausea, vomiting and abdominal pain.  Genitourinary: Negative for dysuria and urgency.  Musculoskeletal: Negative for myalgias and arthralgias.  Skin: Negative for pallor and wound.  Neurological: Negative for dizziness and headaches.      Allergies  Review of patient's allergies indicates no known allergies.  Home Medications   Prior to Admission medications   Medication Sig Start Date End Date Taking? Authorizing Provider  acetaminophen (TYLENOL EX ST ARTHRITIS PAIN) 500 MG tablet Take 500 mg by mouth every 6 (six) hours as needed for mild pain or moderate pain.    Yes Historical Provider, MD  amLODipine (NORVASC) 10 MG tablet TAKE 1 TABLET BY MOUTH EVERY DAY 10/21/15  Yes Reather Littler, MD  atorvastatin (LIPITOR) 10 MG tablet TAKE 1 TABLET BY MOUTH DAILY 09/12/15  Yes Reather Littler, MD  ciprofloxacin (CIPRO) 500 MG tablet Take 1 tablet (500 mg total)  by mouth 2 (two) times daily. 01/13/16  Yes Alison Murray, MD  CRANBERRY PO Take 1 tablet by mouth 2 (two) times daily.   Yes Historical Provider, MD  doxycycline (VIBRA-TABS) 100 MG tablet Take 1 tablet (100 mg total) by mouth every 12 (twelve) hours. 01/13/16  Yes Alison Murray, MD  fenofibrate 160 MG tablet TAKE 1 TABLET BY MOUTH DAILY 10/21/15  Yes Reather Littler, MD  Fexofenadine HCl (ALLEGRA PO) Take 1 tablet by mouth daily as needed (allergies).    Yes Historical Provider, MD  hydrocerin  (EUCERIN) CREA Apply 1 application topically 2 (two) times daily. 01/13/16  Yes Alison Murray, MD  insulin regular human CONCENTRATED (HUMULIN R) 500 UNIT/ML injection INJECT 44 UNITS AS MARKED ON THE INSULIN SYRINGE UNDER THE SKIN THREE TIMES DAILY Patient taking differently: Inject 20-40 Units into the skin 3 (three) times daily with meals as needed (for high blood sugar.). 150 or above take 20 units, 200 or above take 30 units, if 300 or above take 40units. 07/02/15  Yes Reather Littler, MD  mirabegron ER (MYRBETRIQ) 50 MG TB24 tablet Take 50 mg by mouth daily.   Yes Historical Provider, MD  mometasone (NASONEX) 50 MCG/ACT nasal spray Place 2 sprays into the nose daily as needed (allergies).   Yes Historical Provider, MD  naproxen sodium (ANAPROX) 220 MG tablet Take 440 mg by mouth 2 (two) times daily as needed (pain).   Yes Historical Provider, MD  Omega-3 Fatty Acids (FISH OIL) 1000 MG CAPS Take 1 capsule by mouth daily.    Yes Historical Provider, MD  omeprazole (PRILOSEC) 20 MG capsule Take 20 mg by mouth daily.   Yes Historical Provider, MD  ondansetron (ZOFRAN ODT) 4 MG disintegrating tablet Take 1 tablet (4 mg total) by mouth every 8 (eight) hours as needed for nausea. 01/05/16  Yes Rolland Porter, MD  solifenacin (VESICARE) 10 MG tablet Take 10 mg by mouth daily.   Yes Historical Provider, MD  trimethoprim (TRIMPEX) 100 MG tablet Take 100 mg by mouth daily.    Yes Historical Provider, MD  glucose blood (ONE TOUCH ULTRA TEST) test strip TEST EIGHT TIMES DAILY 11/15/15   Reather Littler, MD  Insulin Pen Needle (NOVOTWIST) 32G X 5 MM MISC Use to inject victoza daily 02/07/15   Reather Littler, MD  ONE TOUCH LANCETS MISC Use to check blood sugars 8 times daily 10/16/13   Reather Littler, MD   BP 136/55 mmHg  Pulse 93  Temp(Src) 97.8 F (36.6 C) (Oral)  Resp 22  Ht 5\' 4"  (1.626 m)  Wt 278 lb 10.6 oz (126.4 kg)  BMI 47.81 kg/m2  SpO2 99% Physical Exam  Constitutional: She is oriented to person, place, and time. She  appears well-developed and well-nourished. No distress.  HENT:  Head: Normocephalic and atraumatic.  Eyes: EOM are normal. Pupils are equal, round, and reactive to light.  Neck: Normal range of motion. Neck supple.  Cardiovascular: Normal rate and regular rhythm.  Exam reveals no gallop and no friction rub.   No murmur heard. Pulmonary/Chest: Effort normal. She has no wheezes. She has no rales.  Abdominal: Soft. She exhibits no distension. There is tenderness. There is no rebound and no guarding.  Large scars noted to abdomen with some dusky appearing areas around the initial location of the umbilicus.  Diffusely tender.   Musculoskeletal: She exhibits no edema or tenderness.  Neurological: She is alert and oriented to person, place, and time.  Skin: Skin is  warm and dry. She is not diaphoretic.  Hot to touch with erythematous area to LLE.  Hx of cellulitis to the area post abx  Psychiatric: She has a normal mood and affect. Her behavior is normal.  Nursing note and vitals reviewed.   ED Course  Procedures (including critical care time) Labs Review Labs Reviewed  CBC WITH DIFFERENTIAL/PLATELET - Abnormal; Notable for the following:    RBC 3.63 (*)    Hemoglobin 10.3 (*)    HCT 33.2 (*)    Neutro Abs 8.0 (*)    Lymphs Abs 0.6 (*)    All other components within normal limits  COMPREHENSIVE METABOLIC PANEL - Abnormal; Notable for the following:    Chloride 100 (*)    Glucose, Bld 282 (*)    BUN 31 (*)    Creatinine, Ser 2.15 (*)    Albumin 3.0 (*)    ALT 13 (*)    GFR calc non Af Amer 22 (*)    GFR calc Af Amer 25 (*)    Anion gap 16 (*)    All other components within normal limits  URINALYSIS, ROUTINE W REFLEX MICROSCOPIC (NOT AT [facility]) - Abnormal; Notable for the following:    Color, Urine ORANGE (*)    APPearance TURBID (*)    Glucose, UA 100 (*)    Hgb urine dipstick LARGE (*)    Bilirubin Urine SMALL (*)    Protein, ur 100 (*)    Nitrite POSITIVE (*)    Leukocytes, UA  LARGE (*)    All other components within normal limits  URINE MICROSCOPIC-ADD ON - Abnormal; Notable for the following:    Squamous Epithelial / LPF 0-5 (*)    Bacteria, UA MANY (*)    All other components within normal limits  COMPREHENSIVE METABOLIC PANEL - Abnormal; Notable for the following:    Chloride 97 (*)    Glucose, Bld 292 (*)    BUN 35 (*)    Creatinine, Ser 2.40 (*)    Calcium 8.8 (*)    Albumin 2.7 (*)    ALT 13 (*)    GFR calc non Af Amer 19 (*)    GFR calc Af Amer 22 (*)    Anion gap 16 (*)    All other components within normal limits  CBC WITH DIFFERENTIAL/PLATELET - Abnormal; Notable for the following:    RBC 3.37 (*)    Hemoglobin 9.7 (*)    HCT 31.3 (*)    Lymphs Abs 0.6 (*)    All other components within normal limits  GLUCOSE, CAPILLARY - Abnormal; Notable for the following:    Glucose-Capillary 244 (*)    All other components within normal limits  GLUCOSE, CAPILLARY - Abnormal; Notable for the following:    Glucose-Capillary 222 (*)    All other components within normal limits  GLUCOSE, CAPILLARY - Abnormal; Notable for the following:    Glucose-Capillary 202 (*)    All other components within normal limits  GLUCOSE, CAPILLARY - Abnormal; Notable for the following:    Glucose-Capillary 186 (*)    All other components within normal limits  CBC - Abnormal; Notable for the following:    WBC 12.3 (*)    RBC 3.31 (*)    Hemoglobin 9.5 (*)    HCT 30.6 (*)    All other components within normal limits  BASIC METABOLIC PANEL - Abnormal; Notable for the following:    Glucose, Bld 281 (*)    BUN 34 (*)  Creatinine, Ser 1.56 (*)    Calcium 8.2 (*)    GFR calc non Af Amer 32 (*)    GFR calc Af Amer 37 (*)    All other components within normal limits  GLUCOSE, CAPILLARY - Abnormal; Notable for the following:    Glucose-Capillary 228 (*)    All other components within normal limits  CULTURE, BLOOD (ROUTINE X 2)  CULTURE, BLOOD (ROUTINE X 2)  SURGICAL  PCR SCREEN  URINE CULTURE  URINE CULTURE  LIPASE, BLOOD  LACTIC ACID, PLASMA  PROCALCITONIN  CBC  BASIC METABOLIC PANEL  I-STAT CG4 LACTIC ACID, ED  TYPE AND SCREEN  ABO/RH  SURGICAL PATHOLOGY    Imaging Review Ct Abdomen Pelvis Wo Contrast  01/14/2016  CLINICAL DATA:  70 year old female with history of abdominal pain most severe in the right lower quadrant. Three episodes of emesis today. EXAM: CT ABDOMEN AND PELVIS WITHOUT CONTRAST TECHNIQUE: Multidetector CT imaging of the abdomen and pelvis was performed following the standard protocol without IV contrast. COMPARISON:  CT the abdomen and pelvis 01/06/2016. FINDINGS: Lower chest: Airspace consolidation in the lung bases bilaterally, most severe in the left lower lobe, concerning for sequela of recent aspiration. Severe calcifications of the mitral valve and mitral annulus. Atherosclerotic calcifications in the right coronary artery. Hepatobiliary: No definite cystic or solid hepatic lesions are noted on today's noncontrast CT examination. Multiple small calcified gallstones lying dependently in the gallbladder. No evidence to suggest an acute cholecystitis at this time. Pancreas: No definite pancreatic mass or peripancreatic inflammatory changes on today's noncontrast CT examination. Spleen: Unremarkable. Adrenals/Urinary Tract: Left kidney is normal in appearance. Calcification in the medial limb of the left adrenal gland likely related to remote hemorrhage or infection. 2.8 x 1.6 cm indeterminate right adrenal nodule is similar to remote prior study 05/23/2013, favored to represent a benign lesion such as a lipid poor adenoma. Right kidney is remarkable for apparent urothelial thickening and slight hydroureteronephrosis. There are no calcifications identified within the collecting system of the right kidney, along the course of the right ureter or within the lumen of the urinary bladder. No right-sided hydroureter. Urinary bladder is nearly  completely decompressed, but otherwise unremarkable in appearance. Stomach/Bowel: The appearance of the stomach is unremarkable. Multiple dilated loops of small bowel are noted measuring up to 5.6 cm in diameter, and there multiple air-fluid levels noted. This affects predominantly the proximal and mid small bowel extending to the level of the proximal ileum, where there is a small ventral hernia containing a short segment of the proximal ileum, beyond which the distal small bowel appears completely decompressed. Colon is relatively decompressed as well. Numerous colonic diverticulae are noted, without surrounding inflammatory changes to suggest an acute diverticulitis at this time. Vascular/Lymphatic: Atherosclerosis throughout the abdominal and pelvic vasculature, without evidence of aneurysm or dissection. No lymphadenopathy noted in the abdomen or pelvis on today's noncontrast CT examination. Reproductive: Uterus and ovaries are atrophic. Other: No significant volume of ascites.  No pneumoperitoneum. Musculoskeletal: Extensive skin thickening in the subcutaneous fat of the patient's large pannus, with there multiple dystrophic calcifications. These findings appear to be chronic and are similar to the prior study. There are no aggressive appearing lytic or blastic lesions noted in the visualized portions of the skeleton. IMPRESSION: 1. Small bowel obstruction related to entrapment of proximal ileum in a small ventral hernia. Proximal small bowel is dilated with multiple air-fluid levels measuring up to 5.6 cm in diameter. Distal small bowel is completely decompressed, and  colon is largely decompressed as well. Emergent surgical consultation is strongly recommended. 2. Extensive airspace disease in the lung bases bilaterally (left greater than right), concerning for potential aspiration pneumonia. 3. Cholelithiasis without evidence of acute cholecystitis at this time. 4. Chronic mild right hydronephrosis with  urothelial thickening in the right renal collecting system, poorly evaluated on today's noncontrast CT examination, but similar in retrospect to prior studies. Nonemergent Urology consultation is recommended for further evaluation. 5. Atherosclerosis, including right coronary artery disease. 6. There are calcifications of the mitral valve and mitral annulus. Echocardiographic correlation for evaluation of potential valvular dysfunction may be warranted if clinically indicated. 7. Additional incidental findings, as above. Electronically Signed   By: Trudie Reed M.D.   On: 01/14/2016 22:13   I have personally reviewed and evaluated these images and lab results as part of my medical decision-making.   EKG Interpretation None      MDM   Final diagnoses:  SBO (small bowel obstruction) (HCC)  Cellulitis of left lower extremity  UTI (lower urinary tract infection)    70 yo F with hx concerning for SBO.  Found on CT.  Discussed with general surgery, Dr. Odie Sera. as patient was recent hospitalist admit, will be readmitted under them with surgery in consult. Also found to have UTI.  Started on rocephin.  Admit.   CRITICAL CARE Performed by: Rae Roam   Total critical care time: 35 minutes  Critical care time was exclusive of separately billable procedures and treating other patients.  Critical care was necessary to treat or prevent imminent or life-threatening deterioration.  Critical care was time spent personally by me on the following activities: development of treatment plan with patient and/or surrogate as well as nursing, discussions with consultants, evaluation of patient's response to treatment, examination of patient, obtaining history from patient or surrogate, ordering and performing treatments and interventions, ordering and review of laboratory studies, ordering and review of radiographic studies, pulse oximetry and re-evaluation of patient's condition.   The  patients results and plan were reviewed and discussed.   Any x-rays performed were independently reviewed by myself.   Differential diagnosis were considered with the presenting HPI.  Medications  fluticasone (FLONASE) 50 MCG/ACT nasal spray 2 spray ( Each Nare MAR [place] 01/15/16 1644)  ondansetron (ZOFRAN) tablet 4 mg ( Oral MAR [place] 01/15/16 1644)  insulin aspart (novoLOG) injection 0-9 Units ( Subcutaneous MAR [place] 01/15/16 1644)  insulin glargine (LANTUS) injection 5 Units ( Subcutaneous MAR [place] 01/15/16 1644)  piperacillin-tazobactam (ZOSYN) IVPB 3.375 g ( Intravenous MAR [place] 01/15/16 1644)  phenol (CHLORASEPTIC) mouth spray 1 spray ( Mouth/Throat MAR [place] 01/15/16 1644)  chlorhexidine gluconate (PERIDEX) 0.12 % solution 15 mL ( Mouth/Throat MAR [place] 01/15/16 1644)  vancomycin (VANCOCIN) 1,750 mg in sodium chloride 0.9 % 500 mL IVPB ( Intravenous MAR [place] 01/15/16 1644)  hydrALAZINE (APRESOLINE) injection 10 mg ( Intravenous MAR [place] 01/15/16 1644)  HYDROmorphone (DILAUDID) 1 MG/ML injection (not administered)  heparin injection 5,000 Units (not administered)  0.9 %  sodium chloride infusion ( Intravenous New Bag/Given 01/15/16 1802)  HYDROmorphone (DILAUDID) injection 1 mg (1 mg Intravenous Given 01/15/16 1710)  ondansetron (ZOFRAN-ODT) disintegrating tablet 4 mg (not administered)    Or  ondansetron (ZOFRAN) injection 4 mg (not administered)  pantoprazole (PROTONIX) injection 40 mg (not administered)  HYDROmorphone (DILAUDID) 1 MG/ML injection (not administered)  acetaminophen (OFIRMEV) 10 MG/ML IV (not administered)  0.9 %  sodium chloride infusion (0 mLs Intravenous Stopped 01/14/16 2136)  morphine  4 MG/ML injection 4 mg (4 mg Intravenous Given 01/14/16 2036)  ondansetron (ZOFRAN) injection 4 mg (4 mg Intravenous Given 01/14/16 2036)  cefTRIAXone (ROCEPHIN) 1 g in dextrose 5 % 50 mL IVPB (0 g Intravenous Stopped 01/14/16 2326)  ondansetron (ZOFRAN) injection 4 mg (4 mg  Intravenous Given 01/14/16 2256)  morphine 4 MG/ML injection 4 mg (4 mg Intravenous Given 01/14/16 2339)  0.9 %  sodium chloride infusion ( Intravenous Transfusing/Transfer 01/15/16 0013)  sodium chloride 0.9 % bolus 500 mL (500 mLs Intravenous New Bag/Given 01/15/16 0020)  vancomycin (VANCOCIN) 2,500 mg in sodium chloride 0.9 % 500 mL IVPB (2,500 mg Intravenous Given 01/15/16 0142)  acetaminophen (OFIRMEV) IV 1,000 mg (1,000 mg Intravenous Given 01/15/16 1619)    Filed Vitals:   01/15/16 1615 01/15/16 1630 01/15/16 1654 01/15/16 1700  BP: 162/76 154/75 136/55 136/55  Pulse: 84 88 89 93  Temp:  97.8 F (36.6 C)    TempSrc:      Resp: Height:      Weight:      SpO2: 100% 100% 99% 99%    Final diagnoses:  Abdominal pain    Admission/ observation were discussed with the admitting physician, patient and/or family and they are comfortable with the plan.      Melene Plan, DO 01/15/16 1812

## 2016-01-15 NOTE — H&P (Signed)
[facility] History and Physical  Valerie [name] ZOX:[id number] DOB: June 24, 1946 DOA: 01/14/2016  Referring physician: Dr. Adela Lank. PCP: Garth Schlatter, MD  Specialists: Dr. Derrell Lolling. General surgeon.  Chief Complaint: Nausea vomiting and abdominal discomfort.  HPI: Valerie [name] is a 70 y.o. female with history of necrotizing pancreatitis, diabetes mellitus, hypertension who was discharged yesterday presents with complaints of nausea vomiting and abdominal discomfort. Patient has had multiple episodes of nausea and vomiting. Patient's last bowel movement was 4 days ago. CT of the abdomen and pelvis shows small bowel obstruction with proximal ileum entrapment in the ventral hernia. Dr. Johna Sheriff has been consulted. Patient is admitted for further management of small bowel obstruction. On exam patient also has left lower extremity cellulitis with UA showing features consistent with UTI. Patient on admission is mildly hypotensive which improved with fluid bolus of 2.5 L. Denies any chest pain or shortness of breath. CT does show features concerning for aspiration pneumonia.   Review of Systems: As presented in the history of presenting illness, rest negative.  Past Medical History  Diagnosis Date  . Diabetes mellitus without complication (HCC)   . Hypertension   . Hyperlipidemia   . Seasonal allergies   . Chronic UTI   . GERD (gastroesophageal reflux disease)   . Necrotizing pancreatitis    Past Surgical History  Procedure Laterality Date  . Pancreas surgery  1985  . Cataract extraction w/ intraocular lens  implant, bilateral     Social History:  reports that she has quit smoking. She does not have any smokeless tobacco history on file. She reports that she does not drink alcohol. Her drug history is not on file. Where does patient live home. Can patient participate in ADLs? Yes.  No Known Allergies  Family History:  Family History  Problem Relation Age of Onset  . Cancer  Father     Prostate cancer  . Cancer Mother     Gynecologic      Prior to Admission medications   Medication Sig Start Date End Date Taking? Authorizing Provider  acetaminophen (TYLENOL EX ST ARTHRITIS PAIN) 500 MG tablet Take 500 mg by mouth every 6 (six) hours as needed for mild pain or moderate pain.    Yes Historical Provider, MD  amLODipine (NORVASC) 10 MG tablet TAKE 1 TABLET BY MOUTH EVERY DAY 10/21/15  Yes Reather Littler, MD  atorvastatin (LIPITOR) 10 MG tablet TAKE 1 TABLET BY MOUTH DAILY 09/12/15  Yes Reather Littler, MD  ciprofloxacin (CIPRO) 500 MG tablet Take 1 tablet (500 mg total) by mouth 2 (two) times daily. 01/13/16  Yes Alison Murray, MD  CRANBERRY PO Take 1 tablet by mouth 2 (two) times daily.   Yes Historical Provider, MD  doxycycline (VIBRA-TABS) 100 MG tablet Take 1 tablet (100 mg total) by mouth every 12 (twelve) hours. 01/13/16  Yes Alison Murray, MD  fenofibrate 160 MG tablet TAKE 1 TABLET BY MOUTH DAILY 10/21/15  Yes Reather Littler, MD  Fexofenadine HCl (ALLEGRA PO) Take 1 tablet by mouth daily as needed (allergies).    Yes Historical Provider, MD  hydrocerin (EUCERIN) CREA Apply 1 application topically 2 (two) times daily. 01/13/16  Yes Alison Murray, MD  insulin regular human CONCENTRATED (HUMULIN R) 500 UNIT/ML injection INJECT 44 UNITS AS MARKED ON THE INSULIN SYRINGE UNDER THE SKIN THREE TIMES DAILY Patient taking differently: Inject 20-40 Units into the skin 3 (three) times daily with meals as needed (for high blood sugar.). 150 or above take 20  units, 200 or above take 30 units, if 300 or above take 40units. 07/02/15  Yes Reather Littler, MD  mirabegron ER (MYRBETRIQ) 50 MG TB24 tablet Take 50 mg by mouth daily.   Yes Historical Provider, MD  mometasone (NASONEX) 50 MCG/ACT nasal spray Place 2 sprays into the nose daily as needed (allergies).   Yes Historical Provider, MD  naproxen sodium (ANAPROX) 220 MG tablet Take 440 mg by mouth 2 (two) times daily as needed (pain).   Yes  Historical Provider, MD  Omega-3 Fatty Acids (FISH OIL) 1000 MG CAPS Take 1 capsule by mouth daily.    Yes Historical Provider, MD  omeprazole (PRILOSEC) 20 MG capsule Take 20 mg by mouth daily.   Yes Historical Provider, MD  ondansetron (ZOFRAN ODT) 4 MG disintegrating tablet Take 1 tablet (4 mg total) by mouth every 8 (eight) hours as needed for nausea. 01/05/16  Yes Rolland Porter, MD  solifenacin (VESICARE) 10 MG tablet Take 10 mg by mouth daily.   Yes Historical Provider, MD  trimethoprim (TRIMPEX) 100 MG tablet Take 100 mg by mouth daily.    Yes Historical Provider, MD  glucose blood (ONE TOUCH ULTRA TEST) test strip TEST EIGHT TIMES DAILY 11/15/15   Reather Littler, MD  Insulin Pen Needle (NOVOTWIST) 32G X 5 MM MISC Use to inject victoza daily 02/07/15   Reather Littler, MD  ONE TOUCH LANCETS MISC Use to check blood sugars 8 times daily 10/16/13   Reather Littler, MD    Physical Exam: Filed Vitals:   01/14/16 2017 01/14/16 2259  BP: 103/53 107/56  Pulse: 91 97  Temp: 98.6 F (37 C)   TempSrc: Oral   Resp: 20 18  SpO2: 94% 91%     General:  Moderately built and nourished.  Eyes: Anicteric no pallor.  ENT: No discharge from the ears eyes nose or mouth.  Neck: No mass felt.  Cardiovascular: S1 and S2 heard.  Respiratory: No rhonchi or crepitations.  Abdomen: Scars from previous surgery seen. Soft nontender. Bowel sounds not appreciated. Distended.  Skin: Multiple scars on the abdomen. Left lower extremity erythema.  Musculoskeletal: Left lower extremity erythema.  Psychiatric: Appears normal.  Neurologic: Alert awake oriented to time place and person. Moves all extremities.  Labs on Admission:  Basic Metabolic Panel:  Recent Labs Lab 01/08/16 0530 01/09/16 0425 01/12/16 0532 01/13/16 0415 01/14/16 2029  NA 141 140 137 138 139  K 4.6 3.9 4.2 4.1 4.2  CL 110 107 101 103 100*  CO2 GLUCOSE 163* 151* 271* 229* 282*  BUN 35* 31*  CREATININE 0.84 0.74  0.69 0.73 2.15*  CALCIUM 8.9 9.1 8.8* 9.1 9.8   Liver Function Tests:  Recent Labs Lab 01/14/16 2029  AST 19  ALT 13*  ALKPHOS 61  BILITOT 1.0  PROT 6.8  ALBUMIN 3.0*    Recent Labs Lab 01/14/16 2029  LIPASE 24   No results for input(s): AMMONIA in the last 168 hours. CBC:  Recent Labs Lab 01/08/16 0530 01/09/16 0425 01/12/16 0532 01/13/16 0415 01/14/16 2029  WBC 5.3 6.0 5.8 7.0 9.1  NEUTROABS  --   --   --   --  8.0*  HGB 9.8* 9.8* 9.5* 9.9* 10.3*  HCT 30.2* 31.5* 30.3* 31.3* 33.2*  MCV 92.1 93.2 92.9 92.1 91.5  PLT 247 266 232 251 254   Cardiac Enzymes: No results for input(s): CKTOTAL, CKMB, CKMBINDEX, TROPONINI in the last 168 hours.  BNP (  last 3 results) No results for input(s): BNP in the last 8760 hours.  ProBNP (last 3 results) No results for input(s): PROBNP in the last 8760 hours.  CBG:  Recent Labs Lab 01/12/16 0750 01/12/16 1206 01/12/16 1637 01/12/16 2149 01/13/16 0747  GLUCAP 228* 236* 200* 202* 206*    Radiological Exams on Admission: Ct Abdomen Pelvis Wo Contrast  01/14/2016  CLINICAL DATA:  70 year old female with history of abdominal pain most severe in the right lower quadrant. Three episodes of emesis today. EXAM: CT ABDOMEN AND PELVIS WITHOUT CONTRAST TECHNIQUE: Multidetector CT imaging of the abdomen and pelvis was performed following the standard protocol without IV contrast. COMPARISON:  CT the abdomen and pelvis 01/06/2016. FINDINGS: Lower chest: Airspace consolidation in the lung bases bilaterally, most severe in the left lower lobe, concerning for sequela of recent aspiration. Severe calcifications of the mitral valve and mitral annulus. Atherosclerotic calcifications in the right coronary artery. Hepatobiliary: No definite cystic or solid hepatic lesions are noted on today's noncontrast CT examination. Multiple small calcified gallstones lying dependently in the gallbladder. No evidence to suggest an acute cholecystitis at this  time. Pancreas: No definite pancreatic mass or peripancreatic inflammatory changes on today's noncontrast CT examination. Spleen: Unremarkable. Adrenals/Urinary Tract: Left kidney is normal in appearance. Calcification in the medial limb of the left adrenal gland likely related to remote hemorrhage or infection. 2.8 x 1.6 cm indeterminate right adrenal nodule is similar to remote prior study 05/23/2013, favored to represent a benign lesion such as a lipid poor adenoma. Right kidney is remarkable for apparent urothelial thickening and slight hydroureteronephrosis. There are no calcifications identified within the collecting system of the right kidney, along the course of the right ureter or within the lumen of the urinary bladder. No right-sided hydroureter. Urinary bladder is nearly completely decompressed, but otherwise unremarkable in appearance. Stomach/Bowel: The appearance of the stomach is unremarkable. Multiple dilated loops of small bowel are noted measuring up to 5.6 cm in diameter, and there multiple air-fluid levels noted. This affects predominantly the proximal and mid small bowel extending to the level of the proximal ileum, where there is a small ventral hernia containing a short segment of the proximal ileum, beyond which the distal small bowel appears completely decompressed. Colon is relatively decompressed as well. Numerous colonic diverticulae are noted, without surrounding inflammatory changes to suggest an acute diverticulitis at this time. Vascular/Lymphatic: Atherosclerosis throughout the abdominal and pelvic vasculature, without evidence of aneurysm or dissection. No lymphadenopathy noted in the abdomen or pelvis on today's noncontrast CT examination. Reproductive: Uterus and ovaries are atrophic. Other: No significant volume of ascites.  No pneumoperitoneum. Musculoskeletal: Extensive skin thickening in the subcutaneous fat of the patient's large pannus, with there multiple dystrophic  calcifications. These findings appear to be chronic and are similar to the prior study. There are no aggressive appearing lytic or blastic lesions noted in the visualized portions of the skeleton. IMPRESSION: 1. Small bowel obstruction related to entrapment of proximal ileum in a small ventral hernia. Proximal small bowel is dilated with multiple air-fluid levels measuring up to 5.6 cm in diameter. Distal small bowel is completely decompressed, and colon is largely decompressed as well. Emergent surgical consultation is strongly recommended. 2. Extensive airspace disease in the lung bases bilaterally (left greater than right), concerning for potential aspiration pneumonia. 3. Cholelithiasis without evidence of acute cholecystitis at this time. 4. Chronic mild right hydronephrosis with urothelial thickening in the right renal collecting system, poorly evaluated on today's noncontrast CT examination,  but similar in retrospect to prior studies. Nonemergent Urology consultation is recommended for further evaluation. 5. Atherosclerosis, including right coronary artery disease. 6. There are calcifications of the mitral valve and mitral annulus. Echocardiographic correlation for evaluation of potential valvular dysfunction may be warranted if clinically indicated. 7. Additional incidental findings, as above. Electronically Signed   By: Trudie Reed M.D.   On: 01/14/2016 22:13     Assessment/Plan Active Problems:   Uncontrolled type 2 diabetes mellitus (HCC)   Acute kidney injury (HCC)   SBO (small bowel obstruction) (HCC)   UTI (lower urinary tract infection)   Hypotension   Cellulitis of left lower extremity   1. Small bowel obstruction - I have discussed with Dr. Johna Sheriff on-call general surgeon. At this time patient will be kept nothing by mouth with G-tube suction. Patient possibly will need surgery for patient's bowel is entrapment in the ventral hernia. 2. Possible aspiration pneumonia with left  lower extremity cellulitis and UTI - I have placed patient on vancomycin and Zosyn for now. Closely follow clinically. 3. Hypotension probably from dehydration improved with fluid bolus - holding of patient's antihypertensives with continuation of IV fluid. Note that patient's CAT scan does show mitral valve calcifications. Closely follow respiratory status. 4. Diabetes mellitus type 2 uncontrolled - I have placed patient on Lantus insulin 5 units with sliding scale coverage. Closely follow CBGs. Patient's Lantus dose may need to be increased. 5. Chronic anemia - follow CBC. 6. Acute renal failure probably from hypotension and dehydration - continue with hydration. Follow metabolic panel.   DVT Prophylaxis SCDs in anticipation of surgery.  Code Status: Full code.  Family Communication: Discussed with patient's husband.  Disposition Plan: Admit to inpatient.    Tomeka Kantner N. [facility] Pager [phone].  If 7PM-7AM, please contact night-coverage [email or web address] Password [email or web address] 01/15/2016, 12:21 AM

## 2016-01-15 NOTE — Op Note (Addendum)
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  Progress note or procedure note with a detailed description of the debridement  procedure.  2.  Tool used for debridement (curette, scapel, etc.)     3.  Frequency of surgical debridement.   Initial debridement  4.  Measurement of total devitalized tissue (wound surface) before and after surgical debridement.   35 cm transversely  X 35 cm vertically X 15 cm deep  5.  Area and depth of devitalized tissue removed from wound.  35 cm transversely by 35 cm vertically.  15 cm deep.  6.  Blood loss and description of tissue removed.  150 mL.  Blood loss.  Thin ischemic skin.  Chronic necrotic subcutaneous fat and muscle fascia.  7.  Evidence of the progress of the wound's response to treatment.  A.  Current wound volume (current dimensions and depth).  initial  B.  Presence (and extent of) of infection.  initial  C.  Presence (and extent of) of non viable tissue.  initial  D.  Other material in the wound that is expected to inhibit healing.    8.  Was there any viable tissue removed (measurements): Small fragments removed in very parts of the wound  Surgeon:                     Angelia Mould. Derrell Lolling, M.D., FACS  Assistant:                    Brett Canales gross, M.D., [facility]  Operative Indications:  This is a 70 year old Caucasian  female who is operated upon emergently for incarcerated incisional hernia with small bowel obstruction.  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  She recovered from that surgery.    She was hospitalized 1 week ago for small bowel obstruction and complex ventral hernia she seemed to improve and was discharged home.  She was readmitted last might with recurrent nausea vomiting abdominal pain.  CT scan shows incarcerated incisional hernia to the right of the midline with a transition point in the hernia.  There is also an incarcerated hernia on the left side of the abdomen containing dilated bowel without a transition point.  There was no evidence of perforation or abscess.  On examination she has multiple ventral hernias, thined out ischemic skin, ecchymoses from insulin injections and the hernias are tender.  The rest of the abdomen was soft.  She is brought to the operating room emergently.  Operative Findings:       The small bowel was obstructed in a incarcerated hernia just to the right of the midline.  There was  an obvious transition point between dilated and collapsed bowel.  The bowel was viable and  I was able to milk small bowel content from proximal to distal and there was no stricture or ischemia.  There was incarcerated transverse colon and small bowel in a hernia on the left side but it did not did not appear obstructed there.  There were football sized pockets of necrotic fat both of the right and left of the midline.  This may have been due to insulin injections or may have been due to ischemia from the hernias.  This all had to be debrided and the wound had to be packed open with a negative pressure dressing.  We could not close the defect primarily and so we chose to repair the defect with a 25 cm x 24 cm piece of Flex HD mesh.  This was done as an inlay and we were able to close the fascia over the top of this  for the most part.  Procedure in Detail:          .  A generous vertical elliptical incision was made excising the old wide scar and the umbilicus.  Dissection was carried down into the subcutaneous tissue.  I ultimately entered the hernia sacs and had to spend over one hour releasing the small bowel and transverse colon adhesions and debriding  necrotic skin, subcutaneous fat, and muscle fascia as described above.  There was no ischemia of the intestine.  There was dead fat in lots of areas but no purulence.  All the debrided material was sent to the lab.  This was a large volume and almost as much as a paniculectomy.     I Carefully examined the small bowel and transverse colon.  The tissues were a little bit edematous but quite viable without any evidence of perforation or ischemia.  We preserved as much of the omentum as we could.  After we dissected out all the intra-abdominal contents I defined  the edges of the true  fascia everywhere.  We undermined the subcutaneous fat away from this tissue.  We found that we could not close this primarily and so I brought a 20 x 25 cm piece of Flex HD biologic mesh to the field.  We made sure that we sutured this in with a smooth side down toward the viscera and the rougher side up toward the fascia.  We sutured this in with interrupted mattress sutures of #1 Novafil as an inlay mesh.  We placed the sutures through the fascia, through the mesh and then back up through the fascia in multiple interrupted sutures tying them one at a time to make sure there was no gaps between the sutures that could entrap the bowel.  This went very well.  After we provided this repair we slowly closed the fascia over the top of this and found that we can at least approximate most of it for coverage.  Hemostasis was excellent.  Wound was irrigated with saline and.  We chose to packed the wound open with a negative pressure dressing.  We used 2 large black sponges and cut them to fit the  wound.  We closed the corners of the wounds on the right side and the left side with skin staples.  We then placed the cellophane clear plastic covering over the top of the sponges.  I cut a small hole in the center and placed the suction plate and connected to suction machine and found that  we had an excellent seal and this applied excellent negative pressure to the wound.  The patient was stable at this point and was taken to the recovery room by the anesthesia staff.  Estimated blood loss 200 mL or less.  Counts correct.  Complications none.     Angelia Mould. Derrell Lolling, M.D., FACS General and Minimally Invasive Surgery Breast and Colorectal Surgery  01/15/2016 3:20 PM

## 2016-01-15 NOTE — Progress Notes (Signed)
Subjective: Stable and alert.  Pain controlled. Afebrile.  Heart rate 95.  BP 123/54. NG has drained about 800 mL.  I have reviewed the CT scans with radiology.  She has a ventral hernia to the right of the midline just above the umbilicus that is the point of obstruction.  No evidence of perforation.  There is another ventral hernia just left of midline in the.  Umbilical area but there is no obstruction there.  There is a small fat-containing ventral hernia in the upper midline that is not an issue.  Hemoglobin 9.7.  White count 7000.  Creatinine 2.4.  Potassium 3.9.  Glucose 292.  Objective: Vital signs in last 24 hours: Temp:  [98.6 F (37 C)-98.8 F (37.1 C)] 98.8 F (37.1 C) (01/18 0518) Pulse Rate:  [52-97] 95 (01/18 0518) Resp:  [18-20] 18 (01/18 0518) BP: (103-132)/(49-56) 123/54 mmHg (01/18 0518) SpO2:  [85 %-94 %] 85 % (01/18 0518) Weight:  [126.4 kg (278 lb 10.6 oz)] 126.4 kg (278 lb 10.6 oz) (01/18 0100) Last BM Date: 01/10/16  Intake/Output from previous day: 01/17 0701 - 01/18 0700 In: 730 [I.V.:730] Out: 800 [Emesis/NG output:800] Intake/Output this shift:    General appearance: Morbidly obese.  Alert and appropriate with normal mental status.  Minimal distress.  NG drainage bilious Resp: clear to auscultation bilaterally GI: Super morbid obesity.  Midline incision healed.  Big defect.  Palpable mass to right of midline and second palpable mass to left of midline consistent with entrapped small bowel.  Mild tenderness.  Skin relatively healthy.  Bruising on either side of pannus due to insulin injections.  Lab Results:   Recent Labs  01/14/16 2029 01/15/16 0142  WBC 9.1 7.0  HGB 10.3* 9.7*  HCT 33.2* 31.3*  PLT 254 225   BMET  Recent Labs  01/14/16 2029 01/15/16 0142  NA 139 136  K 4.2 3.9  CL 100* 97*  CO2 23 23  GLUCOSE 282* 292*  BUN 31* 35*  CREATININE 2.15* 2.40*  CALCIUM 9.8 8.8*   PT/INR No results for input(s): LABPROT, INR in  the last 72 hours. ABG No results for input(s): PHART, HCO3 in the last 72 hours.  Invalid input(s): PCO2, PO2  Studies/Results: Ct Abdomen Pelvis Wo Contrast  01/14/2016  CLINICAL DATA:  70 year old female with history of abdominal pain most severe in the right lower quadrant. Three episodes of emesis today. EXAM: CT ABDOMEN AND PELVIS WITHOUT CONTRAST TECHNIQUE: Multidetector CT imaging of the abdomen and pelvis was performed following the standard protocol without IV contrast. COMPARISON:  CT the abdomen and pelvis 01/06/2016. FINDINGS: Lower chest: Airspace consolidation in the lung bases bilaterally, most severe in the left lower lobe, concerning for sequela of recent aspiration. Severe calcifications of the mitral valve and mitral annulus. Atherosclerotic calcifications in the right coronary artery. Hepatobiliary: No definite cystic or solid hepatic lesions are noted on today's noncontrast CT examination. Multiple small calcified gallstones lying dependently in the gallbladder. No evidence to suggest an acute cholecystitis at this time. Pancreas: No definite pancreatic mass or peripancreatic inflammatory changes on today's noncontrast CT examination. Spleen: Unremarkable. Adrenals/Urinary Tract: Left kidney is normal in appearance. Calcification in the medial limb of the left adrenal gland likely related to remote hemorrhage or infection. 2.8 x 1.6 cm indeterminate right adrenal nodule is similar to remote prior study 05/23/2013, favored to represent a benign lesion such as a lipid poor adenoma. Right kidney is remarkable for apparent urothelial thickening and slight hydroureteronephrosis. There are  no calcifications identified within the collecting system of the right kidney, along the course of the right ureter or within the lumen of the urinary bladder. No right-sided hydroureter. Urinary bladder is nearly completely decompressed, but otherwise unremarkable in appearance. Stomach/Bowel: The  appearance of the stomach is unremarkable. Multiple dilated loops of small bowel are noted measuring up to 5.6 cm in diameter, and there multiple air-fluid levels noted. This affects predominantly the proximal and mid small bowel extending to the level of the proximal ileum, where there is a small ventral hernia containing a short segment of the proximal ileum, beyond which the distal small bowel appears completely decompressed. Colon is relatively decompressed as well. Numerous colonic diverticulae are noted, without surrounding inflammatory changes to suggest an acute diverticulitis at this time. Vascular/Lymphatic: Atherosclerosis throughout the abdominal and pelvic vasculature, without evidence of aneurysm or dissection. No lymphadenopathy noted in the abdomen or pelvis on today's noncontrast CT examination. Reproductive: Uterus and ovaries are atrophic. Other: No significant volume of ascites.  No pneumoperitoneum. Musculoskeletal: Extensive skin thickening in the subcutaneous fat of the patient's large pannus, with there multiple dystrophic calcifications. These findings appear to be chronic and are similar to the prior study. There are no aggressive appearing lytic or blastic lesions noted in the visualized portions of the skeleton. IMPRESSION: 1. Small bowel obstruction related to entrapment of proximal ileum in a small ventral hernia. Proximal small bowel is dilated with multiple air-fluid levels measuring up to 5.6 cm in diameter. Distal small bowel is completely decompressed, and colon is largely decompressed as well. Emergent surgical consultation is strongly recommended. 2. Extensive airspace disease in the lung bases bilaterally (left greater than right), concerning for potential aspiration pneumonia. 3. Cholelithiasis without evidence of acute cholecystitis at this time. 4. Chronic mild right hydronephrosis with urothelial thickening in the right renal collecting system, poorly evaluated on today's  noncontrast CT examination, but similar in retrospect to prior studies. Nonemergent Urology consultation is recommended for further evaluation. 5. Atherosclerosis, including right coronary artery disease. 6. There are calcifications of the mitral valve and mitral annulus. Echocardiographic correlation for evaluation of potential valvular dysfunction may be warranted if clinically indicated. 7. Additional incidental findings, as above. Electronically Signed   By: Trudie Reed M.D.   On: 01/14/2016 22:13    Anti-infectives: Anti-infectives    Start     Dose/Rate Route Frequency Ordered Stop   01/17/16 0600  vancomycin (VANCOCIN) 1,750 mg in sodium chloride 0.9 % 500 mL IVPB     1,750 mg 250 mL/hr over 120 Minutes Intravenous Every 48 hours 01/15/16 0146     01/15/16 0200  piperacillin-tazobactam (ZOSYN) IVPB 3.375 g     3.375 g 12.5 mL/hr over 240 Minutes Intravenous 3 times per day 01/15/16 0049     01/15/16 0130  vancomycin (VANCOCIN) 2,500 mg in sodium chloride 0.9 % 500 mL IVPB     2,500 mg 250 mL/hr over 120 Minutes Intravenous  Once 01/15/16 0055 01/15/16 0342   01/14/16 2230  cefTRIAXone (ROCEPHIN) 1 g in dextrose 5 % 50 mL IVPB     1 g 100 mL/hr over 30 Minutes Intravenous  Once 01/14/16 2226 01/14/16 2326      Assessment/Plan:  Complex ventral hernias with incarceration and small bowel obstruction, at least from the right-sided hernia.  I do not feel that it is safe to treat this nonoperatively and have recommended that we take her to the operating room today to try to relieve her obstruction.  I've  explained to her she is a very high risk patient for recurrence of the hernia, bowel injury, postop cardiac and pulmonary complications etc.  She is aware that if we do not operate on her there is a risk of gangrene and perforation which could be life-threatening.  She is very willing to have the surgery.  I discussed the techniques and risks of the surgery in detail.  She is aware the  risk of bleeding, infection, recurrence of the hernia, bowel injury, colostomy, cardiac pulmonary and thromboembolic problems.  She knows that we cannot do a complex definitive hernia repair and that we are just going to try to relieve her obstruction and get her abdominal wall closed.  All for questions are answered.  She fully agrees with this plan.  She is calling her husband in.   Marked morbid obesity Insulin-dependent diabetes mellitus Gallstones Hypertension Hyperlipidemia Stable adrenal mass Chronic UTI Lower extremity cellulitis Renal insufficiency  LOS: 1 day    Creg Gilmer M 01/15/2016

## 2016-01-15 NOTE — Anesthesia Procedure Notes (Signed)
Procedure Name: Intubation Date/Time: 01/15/2016 12:24 PM Performed by: Orest Dikes Pre-anesthesia Checklist: Patient identified, Emergency Drugs available, Suction available and Patient being monitored Patient Re-evaluated:Patient Re-evaluated prior to inductionOxygen Delivery Method: Circle System Utilized Preoxygenation: Pre-oxygenation with 100% oxygen Intubation Type: IV induction, Rapid sequence and Cricoid Pressure applied Laryngoscope Size: Mac and 4 Grade View: Grade I Tube type: Oral Tube size: 7.5 mm Number of attempts: 1 Airway Equipment and Method: Stylet Placement Confirmation: ETT inserted through vocal cords under direct vision,  positive ETCO2 and breath sounds checked- equal and bilateral Secured at: 21 cm Tube secured with: Tape Dental Injury: Teeth and Oropharynx as per pre-operative assessment

## 2016-01-15 NOTE — Transfer of Care (Signed)
Immediate Anesthesia Transfer of Care Note  Patient: Valerie [name]  Procedure(s) Performed: Procedure(s): EXPLORATORY LAPAROTOMY, LYSIS OF ADHESIONS, EXTENSIVE DEBRIDEMENT OF SKIN AND SUBCUTANEOUS TISSUE AND MUSCLE, REPAIR OF INCARCERATED INCISION HERNIA WITH BIOLOGIC MESH, APPLICATION OF NEGATIVE PRESSURE DRESSING (N/A)  Patient Location: PACU  Anesthesia Type:General  Level of Consciousness:  sedated, patient cooperative and responds to stimulation  Airway & Oxygen Therapy:Patient Spontanous Breathing and Patient connected to face mask oxgen  Post-op Assessment:  Report given to PACU RN and Post -op Vital signs reviewed and stable  Post vital signs:  Reviewed and stable  Last Vitals:  Filed Vitals:   01/15/16 0518 01/15/16 1000  BP: 123/54 118/59  Pulse: 95 89  Temp: 37.1 C 36.7 C  Resp: 18 18    Complications: No apparent anesthesia complications

## 2016-01-15 NOTE — Progress Notes (Signed)
Pt seen and examined at bedside. Admitted after midnight, please see admission note by Dr. Toniann Fail. Admitted with Complex ventral hernias with incarceration and small bowel obstruction, surgery consulted and plan to take to OR this AM. CBC and BMP in AM.   Debbora Presto, MD  [facility] Pager [phone]  If 7PM-7AM, please contact night-coverage [email or web address] Password [email or web address]

## 2016-01-15 NOTE — Progress Notes (Signed)
[facility]  Patient Status: Active (receiving services up to time of hospitalization)  [facility] is providing the following services: RN and PT  If patient discharges after hours, please call [phone].   Valerie [name] 01/15/2016, 9:52 AM

## 2016-01-16 ENCOUNTER — Encounter (HOSPITAL_COMMUNITY): Payer: Self-pay | Admitting: General Surgery

## 2016-01-16 DIAGNOSIS — N390 Urinary tract infection, site not specified: Secondary | ICD-10-CM

## 2016-01-16 LAB — GLUCOSE, CAPILLARY
GLUCOSE-CAPILLARY: 208 mg/dL — AB (ref 65–99)
Glucose-Capillary: 195 mg/dL — ABNORMAL HIGH (ref 65–99)
Glucose-Capillary: 212 mg/dL — ABNORMAL HIGH (ref 65–99)
Glucose-Capillary: 202 mg/dL — ABNORMAL HIGH (ref 65–99)
Glucose-Capillary: 201 mg/dL — ABNORMAL HIGH (ref 65–99)
Glucose-Capillary: 233 mg/dL — ABNORMAL HIGH (ref 65–99)
Glucose-Capillary: 210 mg/dL — ABNORMAL HIGH (ref 65–99)
Glucose-Capillary: 223 mg/dL — ABNORMAL HIGH (ref 65–99)
Glucose-Capillary: 226 mg/dL — ABNORMAL HIGH (ref 65–99)
Glucose-Capillary: 208 mg/dL — ABNORMAL HIGH (ref 65–99)

## 2016-01-16 LAB — URINE CULTURE

## 2016-01-16 LAB — CBC
HCT: 36.9 % (ref 36.0–46.0)
HEMOGLOBIN: 11.3 g/dL — AB (ref 12.0–15.0)
MCH: 28 pg (ref 26.0–34.0)
MCHC: 30.6 g/dL (ref 30.0–36.0)
MCV: 91.3 fL (ref 78.0–100.0)
PLATELETS: 210 10*3/uL (ref 150–400)
RBC: 4.04 MIL/uL (ref 3.87–5.11)
RDW: 14.4 % (ref 11.5–15.5)
WBC: 7.6 10*3/uL (ref 4.0–10.5)

## 2016-01-16 LAB — BASIC METABOLIC PANEL
Anion gap: 11 (ref 5–15)
BUN: 35 mg/dL — AB (ref 6–20)
CALCIUM: 8.4 mg/dL — AB (ref 8.9–10.3)
CO2: 23 mmol/L (ref 22–32)
Chloride: 104 mmol/L (ref 101–111)
Creatinine, Ser: 1.57 mg/dL — ABNORMAL HIGH (ref 0.44–1.00)
GFR calc Af Amer: 37 mL/min — ABNORMAL LOW (ref >60)
GFR, EST NON AFRICAN AMERICAN: 32 mL/min — AB (ref >60)
GLUCOSE: 220 mg/dL — AB (ref 65–99)
Potassium: 4.4 mmol/L (ref 3.5–5.1)
Sodium: 138 mmol/L (ref 135–145)

## 2016-01-16 MED ORDER — INSULIN GLARGINE 100 UNIT/ML ~~LOC~~ SOLN: 10.0000 [IU] | Freq: Every day | SUBCUTANEOUS | Status: DC

## 2016-01-16 MED ORDER — VANCOMYCIN HCL 10 G IV SOLR
1500.0000 mg | INTRAVENOUS | Status: DC
  Administered 2016-01-16: 1500 mg via INTRAVENOUS
  Filled 2016-01-16 (×2): qty 1500

## 2016-01-16 MED ORDER — ACETAMINOPHEN 10 MG/ML IV SOLN
1000.0000 mg | Freq: Once | INTRAVENOUS | Status: AC
  Administered 2016-01-16: 1000 mg via INTRAVENOUS
  Filled 2016-01-16: qty 100

## 2016-01-16 MED ORDER — INSULIN GLARGINE 100 UNIT/ML ~~LOC~~ SOLN
10.0000 [IU] | Freq: Every day | SUBCUTANEOUS | Status: DC
  Administered 2016-01-16 – 2016-01-17 (×2): 10 [IU] via SUBCUTANEOUS
  Filled 2016-01-16 (×3): qty 0.1

## 2016-01-16 MED ORDER — INSULIN ASPART 100 UNIT/ML ~~LOC~~ SOLN
0.0000 [IU] | SUBCUTANEOUS | Status: DC
  Administered 2016-01-16 (×4): 5 [IU] via SUBCUTANEOUS
  Administered 2016-01-17: 3 [IU] via SUBCUTANEOUS
  Administered 2016-01-17: 5 [IU] via SUBCUTANEOUS
  Administered 2016-01-17 (×2): 3 [IU] via SUBCUTANEOUS
  Administered 2016-01-17 – 2016-01-18 (×2): 5 [IU] via SUBCUTANEOUS
  Administered 2016-01-18 (×3): 3 [IU] via SUBCUTANEOUS

## 2016-01-16 NOTE — Progress Notes (Signed)
Patient ID: Valerie [name], female   DOB: 07-16-1946, 70 y.o.   MRN: [id number] [facility] PROGRESS NOTE  Valerie [name] MVH:[id number] DOB: 04/19/1946 DOA: 01/14/2016 PCP: Garth Schlatter, MD  Brief narrative:    70 y.o. female with past medical history significant for overactive bladder, chronic UTIs, necrotizing pancreatitis status post partial pancreatectomy, morbid obesity, type 2 diabetes mellitus, just recently discharged after being hospitalized for small bowel obstruction. Patient presented to [facility] 01/14/2016 with worsening nausea, vomiting and abdominal discomfort for past day prior to this admission. CT abdomen and pelvis on the admission demonstrated small bowel obstruction with proximal ileum entrapment in the ventral hernia.  Patient is status post exploratory laparotomy, LOA, extensive debridement of skin and sq tissue and muscle, repair of incarcerated incisional hernia with biologic mesh, VAC application by Dr. Derrell Lolling 01/15/2016 for Incarcerated incisional hernias, multiple, with small bowel obstruction ischemic fat necrosis of skin and subcutaneous tissue and fascia. She has midline VAC placed and NG tube at this time.  Assessment/Plan:    Principal problem: Exploratory laparotomy / repair of incarcerated incisional hernia / Leukocytosis  - Patient is status post exploratory laparotomy, LOA, extensive debridement of skin and sq tissue and muscle, repair of incarcerated incisional hernia with biologic mesh, VAC application by Dr. Derrell Lolling 01/15/2016 for Incarcerated incisional hernias, multiple, with small bowel obstruction ischemic fat necrosis of skin and subcutaneous tissue and fascia.  - Continue broad-spectrum antibiotics, vancomycin and Zosyn - Blood cultures so far negative - Has midline VAC and NG tube in place  - Appreciate surgery following on their recommendations - Continue supportive care with IV fluids for hydration - Continue nothing by mouth -  Continue pain management  Principal problem: Urinary tract infection - Has had Klebsiella UTI on recent hospitalization but the cultures on this admission so far with insignificant growth - Current antibiotics will cover for UTI  Acute renal failure superimposed on chronic kidney disease stage 4 - Baseline creatinine is 1.34 and on this admission acutely elevated at 2.15 likely in the setting of acute illness - Creatinine is slowly improving towards the baseline value, this morning 1.57   Dyslipidemia associated with diabetes mellitus type 2 - Resume home medications once patient able to tolerate by mouth intake.  - At home she takes Lipitor 10 mg at bedtime, omega 3 and fenofibrate.   Essential hypertension, benign - Use hydralazine as needed for blood pressure control while patient is nothing by mouth  Controlled type 2 diabetes mellitus with peripheral vascular complications with long-term insulin use (HCC) - A1c is 6.4, indicating good glycemic control  - Patient may resume insulin regimen per home regimen as she prefers  Morbid obesity due to excess calorie - Body mass index is 47.81 kg/(m^2). - Now NPO due to acute illness  DVT Prophylaxis  - Heparin subQ ordered   Code Status: Full.  Family Communication:  plan of care discussed with the patient and family at bedside  Disposition Plan: Remains in SDU. Home once cleared by surgery.   IV access:  Peripheral IV  Procedures and diagnostic studies:    Ct Abdomen Pelvis Wo Contrast 01/14/2016  1. Small bowel obstruction related to entrapment of proximal ileum in a small ventral hernia. Proximal small bowel is dilated with multiple air-fluid levels measuring up to 5.6 cm in diameter. Distal small bowel is completely decompressed, and colon is largely decompressed as well. Emergent surgical consultation is strongly recommended. 2. Extensive airspace disease in the lung bases bilaterally (  left greater than right), concerning for  potential aspiration pneumonia. 3. Cholelithiasis without evidence of acute cholecystitis at this time. 4. Chronic mild right hydronephrosis with urothelial thickening in the right renal collecting system, poorly evaluated on today's noncontrast CT examination, but similar in retrospect to prior studies. Nonemergent Urology consultation is recommended for further evaluation. 5. Atherosclerosis, including right coronary artery disease. 6. There are calcifications of the mitral valve and mitral annulus. Echocardiographic correlation for evaluation of potential valvular dysfunction may be warranted if clinically indicated. 7. Additional incidental findings, as above.   Exploratory laparotomy, LOA, extensive debridement of skin and sq tissue and muscle, repair of incarcerated incisional hernia with biologic mesh, VAC application---Dr. Derrell Lolling 01/15/2016 for Incarcerated incisional hernias, multiple, with small bowel obstruction Ischemic fat necrosis of skin and subcutaneous tissue and fascia.  Medical Consultants:  Surgery  Other Consultants:  Diabetic coordinator  IAnti-Infectives:   Vanco and zosyn 01/15/2016 -->  Debbora Presto, MD  [facility] Pager [phone]  If 7PM-7AM, please contact night-coverage [email or web address] Password [email or web address]  Time spent in minutes: 25 minutes   01/16/2016, 12:20 PM   LOS: 2 days    HPI/Subjective: No acute overnight events. Patient reports no vomiting.  Objective: Filed Vitals:   01/16/16 1000 01/16/16 1100 01/16/16 1144 01/16/16 1200  BP: 89/46 141/73  154/74  Pulse: 96 91  95  Temp:   98.5 F (36.9 C)   TempSrc:      Resp: Height:      Weight:      SpO2: 95% 89%  95%    Intake/Output Summary (Last 24 hours) at 01/16/16 1220 Last data filed at 01/16/16 1200  Gross per 24 hour  Intake   4995 ml  Output   1950 ml  Net   3045 ml    Exam:   General:  Pt is alert, follows commands appropriately, not in acute  distress  Cardiovascular: Regular rate and rhythm, S1/S2 (+)  Respiratory: no wheezing, no crackles, no rhonchi  Abdomen: Midline VAC in place, NG in place  Extremities: No edema, pulses palpable bilaterally  Neuro: Grossly nonfocal  Data Reviewed: Basic Metabolic Panel:  Recent Labs Lab 01/13/16 0415 01/14/16 2029 01/15/16 0142 01/15/16 1628 01/16/16 0352  NA 138 139 136 138 138  K 4.1 4.2 3.9 4.1 4.4  CL 103 100* 97* 104 104  CO2 GLUCOSE 229* 282* 292* 281* 220*  BUN 16 31* 35* 34* 35*  CREATININE 0.73 2.15* 2.40* 1.56* 1.57*  CALCIUM 9.1 9.8 8.8* 8.2* 8.4*   Liver Function Tests:  Recent Labs Lab 01/14/16 2029 01/15/16 0142  AST 19 16  ALT 13* 13*  ALKPHOS 61 61  BILITOT 1.0 1.1  PROT 6.8 6.5  ALBUMIN 3.0* 2.7*    Recent Labs Lab 01/14/16 2029  LIPASE 24   CBC:  Recent Labs Lab 01/13/16 0415 01/14/16 2029 01/15/16 0142 01/15/16 1628 01/16/16 0352  WBC 7.0 9.1 7.0 12.3* 7.6  NEUTROABS  --  8.0* 6.0  --   --   HGB 9.9* 10.3* 9.7* 9.5* 11.3*  HCT 31.3* 33.2* 31.3* 30.6* 36.9  MCV 92.1 91.5 92.9 92.4 91.3  PLT 251 254 225 277 210   CBG:  Recent Labs Lab 01/15/16 1817 01/15/16 2052 01/15/16 2353 01/16/16 0407 01/16/16 0746  GLUCAP 290* 237* 223* 202* 201*    Recent Results (from the past 240 hour(s))  Blood culture (routine x 2)     Status:  None   Collection Time: 01/06/16  3:13 PM  Result Value Ref Range Status   Specimen Description BLOOD LEFT ANTECUBITAL  Final   Special Requests BOTTLES DRAWN AEROBIC AND ANAEROBIC  Final   Culture   Final    NO GROWTH 5 DAYS Performed at [facility]    Report Status 01/11/2016 FINAL  Final  Blood culture (routine x 2)     Status: None   Collection Time: 01/06/16  3:14 PM  Result Value Ref Range Status   Specimen Description BLOOD RIGHT FOREARM  Final   Special Requests BOTTLES DRAWN AEROBIC ONLY  Final   Culture   Final    NO GROWTH 5 DAYS Performed at  [facility]    Report Status 01/11/2016 FINAL  Final  Culture, blood (routine x 2)     Status: None (Preliminary result)   Collection Time: 01/15/16  1:40 AM  Result Value Ref Range Status   Specimen Description   Final    BLOOD RIGHT HAND Performed at [facility]    Special Requests BOTTLES DRAWN AEROBIC ONLY 5CC  Final   Culture PENDING  Incomplete   Report Status PENDING  Incomplete  Culture, blood (routine x 2)     Status: None (Preliminary result)   Collection Time: 01/15/16  1:49 AM  Result Value Ref Range Status   Specimen Description   Final    BLOOD LEFT HAND Performed at [facility]    Special Requests BOTTLES DRAWN AEROBIC ONLY 5CC  Final   Culture PENDING  Incomplete   Report Status PENDING  Incomplete  Urine culture     Status: None   Collection Time: 01/15/16 10:21 AM  Result Value Ref Range Status   Specimen Description URINE, CLEAN CATCH  Final   Special Requests NONE  Final   Culture   Final    50,000 COLONIES/mL YEAST Performed at [facility]    Report Status 01/16/2016 FINAL  Final  Surgical pcr screen     Status: None   Collection Time: 01/15/16 10:21 AM  Result Value Ref Range Status   MRSA, PCR NEGATIVE NEGATIVE Final   Staphylococcus aureus NEGATIVE NEGATIVE Final     Scheduled Meds: . heparin  5,000 Units Subcutaneous 3 times per day  . insulin aspart  0-15 Units Subcutaneous 6 times per day  . insulin glargine  10 Units Subcutaneous QHS  . pantoprazole (PROTONIX) IV  40 mg Intravenous QHS  . piperacillin-tazobactam (ZOSYN)  IV  3.375 g Intravenous 3 times per day  . vancomycin  1,500 mg Intravenous Q24H   Continuous Infusions: . sodium chloride 150 mL/hr (01/16/16 0506)

## 2016-01-16 NOTE — Progress Notes (Signed)
Patient's bed has been in the chair position since about 1000 this morning.  Patient refuses to reposition while in the chair position.  She states she is able to tell when she has pressure on one spot and can reposition as needed.   Attempts were made to get patient to stand up at the side of the bed today, she refused.  She stated "I cannot do it today, but I will do it tomorrow."  Education was provided about the importance of repositioning and getting up after surgery, and the risks with not doing so.   Patient was educated about the [employer] and the Flutter Valve.  She was observed demonstrating these interventions successfully, and she was educated on how often to perform them.    Will continue to reinforce importance of interventions, and will continue to monitor.

## 2016-01-16 NOTE — Consult Note (Addendum)
WOC wound consult note Reason for Consult: Consult requested for Vac dressing change on 1/20.  Pt went to the OR yesterday, Vac dressing intact with good seal, small amt pink drainage in the cannister.  WOC team will plan to perform first post-op dressing on Fri with CCS PA.   Cammie Mcgee MSN, RN, CWOCN, [place], CNS [phone]

## 2016-01-16 NOTE — Progress Notes (Signed)
Patient ID: Valerie [name], female   DOB: 1946/10/13, 70 y.o.   MRN: [id number]     [facility]      [address] [place], [place] [address]    Phone: [phone] FAX: [phone]     Subjective: Tearful, nervous about getting OOB.  Talked about pain control and importance of early mobilization. VSS.  Afebrile.  sCr stable, UOP okay.   Objective:  Vital signs:  Filed Vitals:   01/16/16 0500 01/16/16 0600 01/16/16 0700 01/16/16 0748  BP: 158/66 154/76 152/64   Pulse: 91 93 94   Temp:    97.6 F (36.4 C)  TempSrc:    Oral  Resp: '11 15 15   '$ Height:      Weight:      SpO2: 99% 100% 100%     Last BM Date: 01/10/16  Intake/Output   Yesterday:  01/18 0701 - 01/19 0700 In: 3335 [I.V.:3795; IV Piggyback:150] Out: 2050 [Urine:990; Emesis/NG output:605; Drains:405; Blood:50] This shift: I/O last 3 completed shifts: In: 4675 [I.V.:4525; IV Piggyback:150] Out: 2850 [Urine:990; Emesis/NG output:1405; Drains:405; Blood:50]    Physical Exam: General: Pt awake/alert/oriented x4 in no acute distress  Abdomen: Soft.  Nondistended.  Tender.  Midline VAC in place, large defect, serosanguinous output, staples are intact, no surrounding erythema.    Problem List:   Principal Problem:   SBO (small bowel obstruction) (HCC) Active Problems:   Uncontrolled type 2 diabetes mellitus ([place])   Acute kidney injury ([facility])   UTI (lower urinary tract infection)   Hypotension   Cellulitis of left lower extremity    Results:   Labs: Results for orders placed or performed during the hospital encounter of 01/14/16 (from the past 48 hour(s))  CBC WITH DIFFERENTIAL     Status: Abnormal   Collection Time: 01/14/16  8:29 PM  Result Value Ref Range   WBC 9.1 4.0 - 10.5 K/uL   RBC 3.63 (L) 3.87 - 5.11 MIL/uL   Hemoglobin 10.3 (L) 12.0 - 15.0 g/dL   HCT 33.2 (L) 36.0 - 46.0 %   MCV 91.5 78.0 - 100.0 fL   MCH 28.4 26.0 - 34.0 pg   MCHC 31.0  30.0 - 36.0 g/dL   RDW 14.7 11.5 - 15.5 %   Platelets 254 150 - 400 K/uL   Neutrophils Relative % 88 %   Neutro Abs 8.0 (H) 1.7 - 7.7 K/uL   Lymphocytes Relative 6 %   Lymphs Abs 0.6 (L) 0.7 - 4.0 K/uL   Monocytes Relative 5 %   Monocytes Absolute 0.4 0.1 - 1.0 K/uL   Eosinophils Relative 1 %   Eosinophils Absolute 0.1 0.0 - 0.7 K/uL   Basophils Relative 0 %   Basophils Absolute 0.0 0.0 - 0.1 K/uL  Comprehensive metabolic panel     Status: Abnormal   Collection Time: 01/14/16  8:29 PM  Result Value Ref Range   Sodium 139 135 - 145 mmol/L   Potassium 4.2 3.5 - 5.1 mmol/L   Chloride 100 (L) 101 - 111 mmol/L   CO2 23 22 - 32 mmol/L   Glucose, Bld 282 (H) 65 - 99 mg/dL   BUN 31 (H) 6 - 20 mg/dL   Creatinine, Ser 2.15 (H) 0.44 - 1.00 mg/dL    Comment: DELTA CHECK NOTED REPEATED TO VERIFY    Calcium 9.8 8.9 - 10.3 mg/dL   Total Protein 6.8 6.5 - 8.1 g/dL   Albumin 3.0 (L) 3.5 - 5.0 g/dL  AST 19 15 - 41 U/L   ALT 13 (L) 14 - 54 U/L   Alkaline Phosphatase 61 38 - 126 U/L   Total Bilirubin 1.0 0.3 - 1.2 mg/dL   GFR calc non Af Amer 22 (L) >60 mL/min   GFR calc Af Amer 25 (L) >60 mL/min    Comment: (NOTE) The eGFR has been calculated using the CKD EPI equation. This calculation has not been validated in all clinical situations. eGFR's persistently <60 mL/min signify possible Chronic Kidney Disease.    Anion gap 16 (H) 5 - 15  Lipase, blood     Status: None   Collection Time: 01/14/16  8:29 PM  Result Value Ref Range   Lipase 24 11 - 51 U/L  I-Stat CG4 Lactic Acid, ED     Status: None   Collection Time: 01/14/16  8:39 PM  Result Value Ref Range   Lactic Acid, Venous 1.15 0.5 - 2.0 mmol/L  Urinalysis, Routine w reflex microscopic (not at [facility])     Status: Abnormal   Collection Time: 01/14/16  9:42 PM  Result Value Ref Range   Color, Urine ORANGE (A) YELLOW    Comment: BIOCHEMICALS MAY BE AFFECTED BY COLOR   APPearance TURBID (A) CLEAR   Specific Gravity, Urine 1.024 1.005  - 1.030   pH 5.0 5.0 - 8.0   Glucose, UA 100 (A) NEGATIVE mg/dL   Hgb urine dipstick LARGE (A) NEGATIVE   Bilirubin Urine SMALL (A) NEGATIVE   Ketones, ur NEGATIVE NEGATIVE mg/dL   Protein, ur 100 (A) NEGATIVE mg/dL   Nitrite POSITIVE (A) NEGATIVE   Leukocytes, UA LARGE (A) NEGATIVE  Urine microscopic-add on     Status: Abnormal   Collection Time: 01/14/16  9:42 PM  Result Value Ref Range   Squamous Epithelial / LPF 0-5 (A) NONE SEEN   WBC, UA TOO NUMEROUS TO COUNT 0 - 5 WBC/hpf   RBC / HPF TOO NUMEROUS TO COUNT 0 - 5 RBC/hpf   Bacteria, UA MANY (A) NONE SEEN  Lactic acid, plasma     Status: None   Collection Time: 01/15/16 12:46 AM  Result Value Ref Range   Lactic Acid, Venous 1.5 0.5 - 2.0 mmol/L  Glucose, capillary     Status: Abnormal   Collection Time: 01/15/16  1:29 AM  Result Value Ref Range   Glucose-Capillary 244 (H) 65 - 99 mg/dL  Culture, blood (routine x 2)     Status: None (Preliminary result)   Collection Time: 01/15/16  1:40 AM  Result Value Ref Range   Specimen Description      BLOOD RIGHT HAND Performed at [facility]    Special Requests BOTTLES DRAWN AEROBIC ONLY 5CC    Culture PENDING    Report Status PENDING   Procalcitonin - Baseline     Status: None   Collection Time: 01/15/16  1:42 AM  Result Value Ref Range   Procalcitonin 1.70 ng/mL    Comment:        Interpretation: PCT > 0.5 ng/mL and <= 2 ng/mL: Systemic infection (sepsis) is possible, but other conditions are known to elevate PCT as well. (NOTE)         ICU PCT Algorithm               Non ICU PCT Algorithm    ----------------------------     ------------------------------         PCT < 0.25 ng/mL  PCT < 0.1 ng/mL     Stopping of antibiotics            Stopping of antibiotics       strongly encouraged.               strongly encouraged.    ----------------------------     ------------------------------       PCT level decrease by               PCT < 0.25 ng/mL        >= 80% from peak PCT       OR PCT 0.25 - 0.5 ng/mL          Stopping of antibiotics                                             encouraged.     Stopping of antibiotics           encouraged.    ----------------------------     ------------------------------       PCT level decrease by              PCT >= 0.25 ng/mL       < 80% from peak PCT        AND PCT >= 0.5 ng/mL             Continuing antibiotics                                              encouraged.       Continuing antibiotics            encouraged.    ----------------------------     ------------------------------     PCT level increase compared          PCT > 0.5 ng/mL         with peak PCT AND          PCT >= 0.5 ng/mL             Escalation of antibiotics                                          strongly encouraged.      Escalation of antibiotics        strongly encouraged.   Comprehensive metabolic panel     Status: Abnormal   Collection Time: 01/15/16  1:42 AM  Result Value Ref Range   Sodium 136 135 - 145 mmol/L   Potassium 3.9 3.5 - 5.1 mmol/L   Chloride 97 (L) 101 - 111 mmol/L   CO2 23 22 - 32 mmol/L   Glucose, Bld 292 (H) 65 - 99 mg/dL   BUN 35 (H) 6 - 20 mg/dL   Creatinine, Ser 2.40 (H) 0.44 - 1.00 mg/dL   Calcium 8.8 (L) 8.9 - 10.3 mg/dL   Total Protein 6.5 6.5 - 8.1 g/dL   Albumin 2.7 (L) 3.5 - 5.0 g/dL   AST 16 15 - 41 U/L   ALT 13 (L) 14 - 54 U/L   Alkaline Phosphatase 61 38 - 126 U/L   Total Bilirubin 1.1 0.3 -  1.2 mg/dL   GFR calc non Af Amer 19 (L) >60 mL/min   GFR calc Af Amer 22 (L) >60 mL/min    Comment: (NOTE) The eGFR has been calculated using the CKD EPI equation. This calculation has not been validated in all clinical situations. eGFR's persistently <60 mL/min signify possible Chronic Kidney Disease.    Anion gap 16 (H) 5 - 15  CBC WITH DIFFERENTIAL     Status: Abnormal   Collection Time: 01/15/16  1:42 AM  Result Value Ref Range   WBC 7.0 4.0 - 10.5 K/uL   RBC 3.37 (L) 3.87 - 5.11  MIL/uL   Hemoglobin 9.7 (L) 12.0 - 15.0 g/dL   HCT 31.3 (L) 36.0 - 46.0 %   MCV 92.9 78.0 - 100.0 fL   MCH 28.8 26.0 - 34.0 pg   MCHC 31.0 30.0 - 36.0 g/dL   RDW 14.7 11.5 - 15.5 %   Platelets 225 150 - 400 K/uL   Neutrophils Relative % 85 %   Neutro Abs 6.0 1.7 - 7.7 K/uL   Lymphocytes Relative 9 %   Lymphs Abs 0.6 (L) 0.7 - 4.0 K/uL   Monocytes Relative 5 %   Monocytes Absolute 0.3 0.1 - 1.0 K/uL   Eosinophils Relative 1 %   Eosinophils Absolute 0.1 0.0 - 0.7 K/uL   Basophils Relative 0 %   Basophils Absolute 0.0 0.0 - 0.1 K/uL  Culture, blood (routine x 2)     Status: None (Preliminary result)   Collection Time: 01/15/16  1:49 AM  Result Value Ref Range   Specimen Description      BLOOD LEFT HAND Performed at [facility]    Special Requests BOTTLES DRAWN AEROBIC ONLY 5CC    Culture PENDING    Report Status PENDING   Glucose, capillary     Status: Abnormal   Collection Time: 01/15/16  4:22 AM  Result Value Ref Range   Glucose-Capillary 222 (H) 65 - 99 mg/dL  Glucose, capillary     Status: Abnormal   Collection Time: 01/15/16  7:53 AM  Result Value Ref Range   Glucose-Capillary 202 (H) 65 - 99 mg/dL  Surgical pcr screen     Status: None   Collection Time: 01/15/16 10:21 AM  Result Value Ref Range   MRSA, PCR NEGATIVE NEGATIVE   Staphylococcus aureus NEGATIVE NEGATIVE    Comment:        The Xpert SA Assay (FDA approved for NASAL specimens in patients over 36 years of age), is one component of a comprehensive surveillance program.  Test performance has been validated by [facility] for patients greater than or equal to 22 year old. It is not intended to diagnose infection nor to guide or monitor treatment.   Glucose, capillary     Status: Abnormal   Collection Time: 01/15/16 11:48 AM  Result Value Ref Range   Glucose-Capillary 186 (H) 65 - 99 mg/dL   Comment 1 Document in Chart   Type and screen [place]     Status: None    Collection Time: 01/15/16 11:50 AM  Result Value Ref Range   ABO/RH(D) O POS    Antibody Screen NEG    Sample Expiration 01/18/2016   ABO/Rh     Status: None   Collection Time: 01/15/16 11:50 AM  Result Value Ref Range   ABO/RH(D) O POS   Glucose, capillary     Status: Abnormal   Collection Time: 01/15/16 12:53 PM  Result  Value Ref Range   Glucose-Capillary 195 (H) 65 - 99 mg/dL  Glucose, capillary     Status: Abnormal   Collection Time: 01/15/16  2:26 PM  Result Value Ref Range   Glucose-Capillary 212 (H) 65 - 99 mg/dL  Glucose, capillary     Status: Abnormal   Collection Time: 01/15/16  4:16 PM  Result Value Ref Range   Glucose-Capillary 228 (H) 65 - 99 mg/dL  CBC     Status: Abnormal   Collection Time: 01/15/16  4:28 PM  Result Value Ref Range   WBC 12.3 (H) 4.0 - 10.5 K/uL   RBC 3.31 (L) 3.87 - 5.11 MIL/uL   Hemoglobin 9.5 (L) 12.0 - 15.0 g/dL   HCT 30.6 (L) 36.0 - 46.0 %   MCV 92.4 78.0 - 100.0 fL   MCH 28.7 26.0 - 34.0 pg   MCHC 31.0 30.0 - 36.0 g/dL   RDW 14.6 11.5 - 15.5 %   Platelets 277 150 - 400 K/uL  Basic metabolic panel     Status: Abnormal   Collection Time: 01/15/16  4:28 PM  Result Value Ref Range   Sodium 138 135 - 145 mmol/L   Potassium 4.1 3.5 - 5.1 mmol/L   Chloride 104 101 - 111 mmol/L   CO2 22 22 - 32 mmol/L   Glucose, Bld 281 (H) 65 - 99 mg/dL   BUN 34 (H) 6 - 20 mg/dL   Creatinine, Ser 1.56 (H) 0.44 - 1.00 mg/dL   Calcium 8.2 (L) 8.9 - 10.3 mg/dL   GFR calc non Af Amer 32 (L) >60 mL/min   GFR calc Af Amer 37 (L) >60 mL/min    Comment: (NOTE) The eGFR has been calculated using the CKD EPI equation. This calculation has not been validated in all clinical situations. eGFR's persistently <60 mL/min signify possible Chronic Kidney Disease.    Anion gap 12 5 - 15  Glucose, capillary     Status: Abnormal   Collection Time: 01/15/16  6:17 PM  Result Value Ref Range   Glucose-Capillary 290 (H) 65 - 99 mg/dL  Glucose, capillary     Status:  Abnormal   Collection Time: 01/15/16  8:52 PM  Result Value Ref Range   Glucose-Capillary 237 (H) 65 - 99 mg/dL  Glucose, capillary     Status: Abnormal   Collection Time: 01/15/16 11:53 PM  Result Value Ref Range   Glucose-Capillary 223 (H) 65 - 99 mg/dL  CBC     Status: Abnormal   Collection Time: 01/16/16  3:52 AM  Result Value Ref Range   WBC 7.6 4.0 - 10.5 K/uL   RBC 4.04 3.87 - 5.11 MIL/uL   Hemoglobin 11.3 (L) 12.0 - 15.0 g/dL   HCT 36.9 36.0 - 46.0 %   MCV 91.3 78.0 - 100.0 fL   MCH 28.0 26.0 - 34.0 pg   MCHC 30.6 30.0 - 36.0 g/dL   RDW 14.4 11.5 - 15.5 %   Platelets 210 150 - 400 K/uL  Basic metabolic panel     Status: Abnormal   Collection Time: 01/16/16  3:52 AM  Result Value Ref Range   Sodium 138 135 - 145 mmol/L   Potassium 4.4 3.5 - 5.1 mmol/L   Chloride 104 101 - 111 mmol/L   CO2 23 22 - 32 mmol/L   Glucose, Bld 220 (H) 65 - 99 mg/dL   BUN 35 (H) 6 - 20 mg/dL   Creatinine, Ser 1.57 (H) 0.44 - 1.00 mg/dL  Calcium 8.4 (L) 8.9 - 10.3 mg/dL   GFR calc non Af Amer 32 (L) >60 mL/min   GFR calc Af Amer 37 (L) >60 mL/min    Comment: (NOTE) The eGFR has been calculated using the CKD EPI equation. This calculation has not been validated in all clinical situations. eGFR's persistently <60 mL/min signify possible Chronic Kidney Disease.    Anion gap 11 5 - 15  Glucose, capillary     Status: Abnormal   Collection Time: 01/16/16  4:07 AM  Result Value Ref Range   Glucose-Capillary 202 (H) 65 - 99 mg/dL   Comment 1 Notify RN   Glucose, capillary     Status: Abnormal   Collection Time: 01/16/16  7:46 AM  Result Value Ref Range   Glucose-Capillary 201 (H) 65 - 99 mg/dL    Imaging / Studies: Ct Abdomen Pelvis Wo Contrast  01/14/2016  CLINICAL DATA:  70 year old female with history of abdominal pain most severe in the right lower quadrant. Three episodes of emesis today. EXAM: CT ABDOMEN AND PELVIS WITHOUT CONTRAST TECHNIQUE: Multidetector CT imaging of the abdomen  and pelvis was performed following the standard protocol without IV contrast. COMPARISON:  CT the abdomen and pelvis 01/06/2016. FINDINGS: Lower chest: Airspace consolidation in the lung bases bilaterally, most severe in the left lower lobe, concerning for sequela of recent aspiration. Severe calcifications of the mitral valve and mitral annulus. Atherosclerotic calcifications in the right coronary artery. Hepatobiliary: No definite cystic or solid hepatic lesions are noted on today's noncontrast CT examination. Multiple small calcified gallstones lying dependently in the gallbladder. No evidence to suggest an acute cholecystitis at this time. Pancreas: No definite pancreatic mass or peripancreatic inflammatory changes on today's noncontrast CT examination. Spleen: Unremarkable. Adrenals/Urinary Tract: Left kidney is normal in appearance. Calcification in the medial limb of the left adrenal gland likely related to remote hemorrhage or infection. 2.8 x 1.6 cm indeterminate right adrenal nodule is similar to remote prior study 05/23/2013, favored to represent a benign lesion such as a lipid poor adenoma. Right kidney is remarkable for apparent urothelial thickening and slight hydroureteronephrosis. There are no calcifications identified within the collecting system of the right kidney, along the course of the right ureter or within the lumen of the urinary bladder. No right-sided hydroureter. Urinary bladder is nearly completely decompressed, but otherwise unremarkable in appearance. Stomach/Bowel: The appearance of the stomach is unremarkable. Multiple dilated loops of small bowel are noted measuring up to 5.6 cm in diameter, and there multiple air-fluid levels noted. This affects predominantly the proximal and mid small bowel extending to the level of the proximal ileum, where there is a small ventral hernia containing a short segment of the proximal ileum, beyond which the distal small bowel appears completely  decompressed. Colon is relatively decompressed as well. Numerous colonic diverticulae are noted, without surrounding inflammatory changes to suggest an acute diverticulitis at this time. Vascular/Lymphatic: Atherosclerosis throughout the abdominal and pelvic vasculature, without evidence of aneurysm or dissection. No lymphadenopathy noted in the abdomen or pelvis on today's noncontrast CT examination. Reproductive: Uterus and ovaries are atrophic. Other: No significant volume of ascites.  No pneumoperitoneum. Musculoskeletal: Extensive skin thickening in the subcutaneous fat of the patient's large pannus, with there multiple dystrophic calcifications. These findings appear to be chronic and are similar to the prior study. There are no aggressive appearing lytic or blastic lesions noted in the visualized portions of the skeleton. IMPRESSION: 1. Small bowel obstruction related to entrapment of proximal ileum in  a small ventral hernia. Proximal small bowel is dilated with multiple air-fluid levels measuring up to 5.6 cm in diameter. Distal small bowel is completely decompressed, and colon is largely decompressed as well. Emergent surgical consultation is strongly recommended. 2. Extensive airspace disease in the lung bases bilaterally (left greater than right), concerning for potential aspiration pneumonia. 3. Cholelithiasis without evidence of acute cholecystitis at this time. 4. Chronic mild right hydronephrosis with urothelial thickening in the right renal collecting system, poorly evaluated on today's noncontrast CT examination, but similar in retrospect to prior studies. Nonemergent Urology consultation is recommended for further evaluation. 5. Atherosclerosis, including right coronary artery disease. 6. There are calcifications of the mitral valve and mitral annulus. Echocardiographic correlation for evaluation of potential valvular dysfunction may be warranted if clinically indicated. 7. Additional incidental  findings, as above. Electronically Signed   By: Vinnie Langton M.D.   On: 01/14/2016 22:13    Medications / Allergies:  Scheduled Meds: . chlorhexidine gluconate  15 mL Mouth/Throat QID  . fluticasone  2 spray Each Nare Daily  . heparin  5,000 Units Subcutaneous 3 times per day  . insulin aspart  0-9 Units Subcutaneous Q4H  . insulin glargine  5 Units Subcutaneous QHS  . pantoprazole (PROTONIX) IV  40 mg Intravenous QHS  . piperacillin-tazobactam (ZOSYN)  IV  3.375 g Intravenous 3 times per day  . [START ON 01/17/2016] vancomycin  1,750 mg Intravenous Q48H   Continuous Infusions: . sodium chloride 150 mL/hr (01/16/16 0506)   PRN Meds:.hydrALAZINE, HYDROmorphone (DILAUDID) injection, ondansetron **OR** ondansetron (ZOFRAN) IV, ondansetron **OR** [DISCONTINUED] ondansetron (ZOFRAN) IV, phenol  Antibiotics: Anti-infectives    Start     Dose/Rate Route Frequency Ordered Stop   01/17/16 0600  vancomycin (VANCOCIN) 1,750 mg in sodium chloride 0.9 % 500 mL IVPB     1,750 mg 250 mL/hr over 120 Minutes Intravenous Every 48 hours 01/15/16 0146     01/16/16 0600  cefoTEtan (CEFOTAN) 2 g in dextrose 5 % 50 mL IVPB  Status:  Discontinued     2 g 100 mL/hr over 30 Minutes Intravenous On call to O.R. 01/15/16 1700 01/15/16 1709   01/15/16 1130  cefTRIAXone (ROCEPHIN) 1 g in dextrose 5 % 50 mL IVPB  Status:  Discontinued     1 g 100 mL/hr over 30 Minutes Intravenous Every 24 hours 01/15/16 1123 01/15/16 1124   01/15/16 0200  piperacillin-tazobactam (ZOSYN) IVPB 3.375 g     3.375 g 12.5 mL/hr over 240 Minutes Intravenous 3 times per day 01/15/16 0049     01/15/16 0130  vancomycin (VANCOCIN) 2,500 mg in sodium chloride 0.9 % 500 mL IVPB     2,500 mg 250 mL/hr over 120 Minutes Intravenous  Once 01/15/16 0055 01/15/16 0342   01/14/16 2230  cefTRIAXone (ROCEPHIN) 1 g in dextrose 5 % 50 mL IVPB     1 g 100 mL/hr over 30 Minutes Intravenous  Once 01/14/16 2226 01/14/16 2326         Assessment/Plan POD#1 exploratory laparotomy, LOA, extensive debridement of skin and sq tissue and muscle, repair of incarcerated incisional hernia with biologic mesh, VAC application---Dr. Dalbert Batman -continue NGT decompression, bowel rest -PT eval, OOB as tolerated -VAC changes MWF Renal insufficiency-stable  DM II-CBGs 200s, SSI, consider increase from sensitive.  On lantus 5u FEN-NPO x ice chips.  Dilaudid, renew tylenol VTE prophylaxis-SCD/heparin ID-zosyn/Vanc Dispo-SDU   Erby Pian, [facility] Surgery Pager [phone])   01/16/2016 8:19 AM

## 2016-01-16 NOTE — Progress Notes (Signed)
Inpatient Diabetes Program Recommendations  AACE/ADA: New Consensus Statement on Inpatient Glycemic Control (2015)  Target Ranges:  Prepandial:   less than 140 mg/dL      Peak postprandial:   less than 180 mg/dL (1-2 hours)      Critically ill patients:  140 - 180 mg/dL  Results for Valerie [name], Valerie [name] (MRN [id number]) as of 01/16/2016 08:59  Ref. Range 01/15/2016 07:53 01/15/2016 11:48 01/15/2016 12:53 01/15/2016 14:26 01/15/2016 16:16 01/15/2016 18:17 01/15/2016 20:52 01/15/2016 23:53 01/16/2016 04:07 01/16/2016 07:46  Glucose-Capillary Latest Ref Range: 65-99 mg/dL 562 (H) 130 (H) 865 (H) 212 (H) 228 (H) 290 (H) 237 (H) 223 (H) 202 (H) 201 (H)   Review of Glycemic Control  Diabetes history: DM2 Outpatient Diabetes medications: Humulin R U500 20-40 units TID(CBG of 150 or above take 20 units, 200 or above take 30 units, if 300 or above take 40units) Current orders for Inpatient glycemic control: Lantus 5 units QHS, Novolog 0-9 units Q4H  Inpatient Diabetes Program Recommendations: Insulin - Basal: Please consider increasing Lantus to 13 units QHS (based on 126 kg x 0.1 units) Correction (SSI): Please consider increasing Novolog correction to MODERATE scale Q4H.  Thanks, Orlando Penner, RN, MSN, CDE Diabetes Coordinator Inpatient Diabetes Program [phone] (Team Pager from 8am to 5pm) [phone] ([facility] office) [phone] [facility] office) [phone] [facility] office)

## 2016-01-16 NOTE — Progress Notes (Signed)
ANTIBIOTIC CONSULT NOTE - Follow-Up  Pharmacy Consult for Zosyn/Vancomycin Indication: Intra-abdominal infection/Cellulitis  No Known Allergies  Patient Measurements: Height:  (162.6 cm) Weight: 278 lb 10.6 oz (126.4 kg) IBW/kg (Calculated) : 54.7 Wt=126 kg  Vital Signs: Temp: 97.6 F (36.4 C) (01/19 0748) Temp Source: Oral (01/19 0748) BP: 89/46 mmHg (01/19 1000) Pulse Rate: 96 (01/19 1000) Intake/Output from previous day: 01/18 0701 - 01/19 0700 In: 4245 [I.V.:4095; IV Piggyback:150] Out: 2050 [Urine:990; Emesis/NG output:605; Drains:405; Blood:50] Intake/Output from this shift: Total I/O In: 300 [I.V.:300] Out: -   Labs:  Recent Labs  01/15/16 0142 01/15/16 1628 01/16/16 0352  WBC 7.0 12.3* 7.6  HGB 9.7* 9.5* 11.3*  PLT 225 277 210  CREATININE 2.40* 1.56* 1.57*   Estimated Creatinine Clearance: 43.3 mL/min (by C-G formula based on Cr of 1.57). No results for input(s): VANCOTROUGH, VANCOPEAK, VANCORANDOM, GENTTROUGH, GENTPEAK, GENTRANDOM, TOBRATROUGH, TOBRAPEAK, TOBRARND, AMIKACINPEAK, AMIKACINTROU, AMIKACIN in the last 72 hours.   Microbiology: Recent Results (from the past 720 hour(s))  Urine culture     Status: None   Collection Time: 01/05/16  1:30 PM  Result Value Ref Range Status   Specimen Description URINE, CLEAN CATCH  Final   Special Requests none Normal  Final   Culture   Final    >=100,000 COLONIES/mL KLEBSIELLA PNEUMONIAE Performed at [facility]    Report Status 01/07/2016 FINAL  Final   Organism ID, Bacteria KLEBSIELLA PNEUMONIAE  Final      Susceptibility   Klebsiella pneumoniae - MIC*    AMPICILLIN >=32 RESISTANT Resistant     CEFAZOLIN <=4 SENSITIVE Sensitive     CEFTRIAXONE <=1 SENSITIVE Sensitive     CIPROFLOXACIN <=0.25 SENSITIVE Sensitive     GENTAMICIN <=1 SENSITIVE Sensitive     IMIPENEM <=0.25 SENSITIVE Sensitive     NITROFURANTOIN 64 INTERMEDIATE Intermediate     TRIMETH/SULFA <=20 SENSITIVE Sensitive    AMPICILLIN/SULBACTAM 8 SENSITIVE Sensitive     PIP/TAZO <=4 SENSITIVE Sensitive     * >=100,000 COLONIES/mL KLEBSIELLA PNEUMONIAE  Blood culture (routine x 2)     Status: None   Collection Time: 01/06/16  3:13 PM  Result Value Ref Range Status   Specimen Description BLOOD LEFT ANTECUBITAL  Final   Special Requests BOTTLES DRAWN AEROBIC AND ANAEROBIC  Final   Culture   Final    NO GROWTH 5 DAYS Performed at [facility]    Report Status 01/11/2016 FINAL  Final  Blood culture (routine x 2)     Status: None   Collection Time: 01/06/16  3:14 PM  Result Value Ref Range Status   Specimen Description BLOOD RIGHT FOREARM  Final   Special Requests BOTTLES DRAWN AEROBIC ONLY  Final   Culture   Final    NO GROWTH 5 DAYS Performed at [facility]    Report Status 01/11/2016 FINAL  Final  Culture, blood (routine x 2)     Status: None (Preliminary result)   Collection Time: 01/15/16  1:40 AM  Result Value Ref Range Status   Specimen Description   Final    BLOOD RIGHT HAND Performed at [facility]    Special Requests BOTTLES DRAWN AEROBIC ONLY 5CC  Final   Culture PENDING  Incomplete   Report Status PENDING  Incomplete  Culture, blood (routine x 2)     Status: None (Preliminary result)   Collection Time: 01/15/16  1:49 AM  Result Value Ref Range Status   Specimen Description  Final    BLOOD LEFT HAND Performed at [facility]    Special Requests BOTTLES DRAWN AEROBIC ONLY 5CC  Final   Culture PENDING  Incomplete   Report Status PENDING  Incomplete  Urine culture     Status: None   Collection Time: 01/15/16 10:21 AM  Result Value Ref Range Status   Specimen Description URINE, CLEAN CATCH  Final   Special Requests NONE  Final   Culture   Final    50,000 COLONIES/mL YEAST Performed at [facility]    Report Status 01/16/2016 FINAL  Final  Surgical pcr screen     Status: None   Collection Time: 01/15/16 10:21 AM  Result Value Ref Range  Status   MRSA, PCR NEGATIVE NEGATIVE Final   Staphylococcus aureus NEGATIVE NEGATIVE Final    Comment:        The Xpert SA Assay (FDA approved for NASAL specimens in patients over 39 years of age), is one component of a comprehensive surveillance program.  Test performance has been validated by [facility] for patients greater than or equal to 64 year old. It is not intended to diagnose infection nor to guide or monitor treatment.     Medical History: Past Medical History  Diagnosis Date  . Diabetes mellitus without complication (HCC)   . Hypertension   . Hyperlipidemia   . Seasonal allergies   . Chronic UTI   . GERD (gastroesophageal reflux disease)   . Necrotizing pancreatitis     Medications:  Prescriptions prior to admission  Medication Sig Dispense Refill Last Dose  . acetaminophen (TYLENOL EX ST ARTHRITIS PAIN) 500 MG tablet Take 500 mg by mouth every 6 (six) hours as needed for mild pain or moderate pain.    01/14/2016 at Unknown time  . amLODipine (NORVASC) 10 MG tablet TAKE 1 TABLET BY MOUTH EVERY DAY 90 tablet 0 Past Week at Unknown time  . atorvastatin (LIPITOR) 10 MG tablet TAKE 1 TABLET BY MOUTH DAILY 90 tablet 0 Past Week at Unknown time  . ciprofloxacin (CIPRO) 500 MG tablet Take 1 tablet (500 mg total) by mouth 2 (two) times daily. 6 tablet 0 01/14/2016 at Unknown time  . CRANBERRY PO Take 1 tablet by mouth 2 (two) times daily.   Past Week at Unknown time  . doxycycline (VIBRA-TABS) 100 MG tablet Take 1 tablet (100 mg total) by mouth every 12 (twelve) hours. 20 tablet 0 01/14/2016 at Unknown time  . fenofibrate 160 MG tablet TAKE 1 TABLET BY MOUTH DAILY 90 tablet 0 Past Week at Unknown time  . Fexofenadine HCl (ALLEGRA PO) Take 1 tablet by mouth daily as needed (allergies).    Past Week at Unknown time  . hydrocerin (EUCERIN) CREA Apply 1 application topically 2 (two) times daily. 113 g 0 01/14/2016 at Unknown time  . insulin regular human CONCENTRATED (HUMULIN  R) 500 UNIT/ML injection INJECT 44 UNITS AS MARKED ON THE INSULIN SYRINGE UNDER THE SKIN THREE TIMES DAILY (Patient taking differently: Inject 20-40 Units into the skin 3 (three) times daily with meals as needed (for high blood sugar.). 150 or above take 20 units, 200 or above take 30 units, if 300 or above take 40units.) 40 mL 3 01/14/2016 at Unknown time  . mirabegron ER (MYRBETRIQ) 50 MG TB24 tablet Take 50 mg by mouth daily.   Past Week at Unknown time  . mometasone (NASONEX) 50 MCG/ACT nasal spray Place 2 sprays into the nose daily as needed (allergies).  Past Week at Unknown time  . naproxen sodium (ANAPROX) 220 MG tablet Take 440 mg by mouth 2 (two) times daily as needed (pain).   01/14/2016 at Unknown time  . Omega-3 Fatty Acids (FISH OIL) 1000 MG CAPS Take 1 capsule by mouth daily.    Past Week at Unknown time  . omeprazole (PRILOSEC) 20 MG capsule Take 20 mg by mouth daily.   Past Week at Unknown time  . ondansetron (ZOFRAN ODT) 4 MG disintegrating tablet Take 1 tablet (4 mg total) by mouth every 8 (eight) hours as needed for nausea. 6 tablet 0 01/14/2016 at Unknown time  . solifenacin (VESICARE) 10 MG tablet Take 10 mg by mouth daily.   Past Week at Unknown time  . trimethoprim (TRIMPEX) 100 MG tablet Take 100 mg by mouth daily.    Past Week at Unknown time  . glucose blood (ONE TOUCH ULTRA TEST) test strip TEST EIGHT TIMES DAILY 750 each 1 Past Week at Unknown time  . Insulin Pen Needle (NOVOTWIST) 32G X 5 MM MISC Use to inject victoza daily 50 each 0 Past Week at Unknown time  . ONE TOUCH LANCETS MISC Use to check blood sugars 8 times daily 300 each 5 Past Week at Unknown time   Scheduled:  . chlorhexidine gluconate  15 mL Mouth/Throat QID  . fluticasone  2 spray Each Nare Daily  . heparin  5,000 Units Subcutaneous 3 times per day  . insulin aspart  0-15 Units Subcutaneous 6 times per day  . insulin glargine  10 Units Subcutaneous QHS  . pantoprazole (PROTONIX) IV  40 mg Intravenous QHS   . piperacillin-tazobactam (ZOSYN)  IV  3.375 g Intravenous 3 times per day  . vancomycin  1,500 mg Intravenous Q24H   Infusions:  . sodium chloride 150 mL/hr (01/16/16 0506)   Assessment: 24 yoF [facility] hospitilization for SBO.  Zosyn per Rx for intra-abdominal infection and Vancomycin for cellulitis.     1/18 >>zosyn  >> 1/18 >>vancomycin  >>    1/18 blood: NGTD 1/18 urine: 04540 colonies of yeast  1/18 MRSA PCR negative    Goal of Therapy:  Vancomycin trough level 15-20 mcg/ml  Plan:   Zosyn 3.375 Gm IV q8h EI  Vancomycin  x1 in ED  Due to improvement in SCr, will adjust dose to vancomycin 1500 mg IV q24h  F/u SCr/cultures/levels as needed   Adalberto Cole, PharmD, BCPS Pager [phone] 01/16/2016 11:36 AM

## 2016-01-17 ENCOUNTER — Encounter (HOSPITAL_COMMUNITY): Payer: Self-pay | Admitting: Internal Medicine

## 2016-01-17 DIAGNOSIS — N184 Chronic kidney disease, stage 4 (severe): Secondary | ICD-10-CM

## 2016-01-17 DIAGNOSIS — E1169 Type 2 diabetes mellitus with other specified complication: Secondary | ICD-10-CM | POA: Diagnosis present

## 2016-01-17 DIAGNOSIS — E1121 Type 2 diabetes mellitus with diabetic nephropathy: Secondary | ICD-10-CM | POA: Diagnosis present

## 2016-01-17 DIAGNOSIS — E785 Hyperlipidemia, unspecified: Secondary | ICD-10-CM

## 2016-01-17 DIAGNOSIS — N179 Acute kidney failure, unspecified: Secondary | ICD-10-CM | POA: Diagnosis present

## 2016-01-17 DIAGNOSIS — D638 Anemia in other chronic diseases classified elsewhere: Secondary | ICD-10-CM | POA: Diagnosis present

## 2016-01-17 DIAGNOSIS — K56609 Unspecified intestinal obstruction, unspecified as to partial versus complete obstruction: Secondary | ICD-10-CM | POA: Diagnosis present

## 2016-01-17 DIAGNOSIS — K460 Unspecified abdominal hernia with obstruction, without gangrene: Secondary | ICD-10-CM | POA: Diagnosis present

## 2016-01-17 DIAGNOSIS — D62 Acute posthemorrhagic anemia: Secondary | ICD-10-CM | POA: Diagnosis present

## 2016-01-17 DIAGNOSIS — Z794 Long term (current) use of insulin: Secondary | ICD-10-CM

## 2016-01-17 DIAGNOSIS — B3749 Other urogenital candidiasis: Secondary | ICD-10-CM | POA: Diagnosis present

## 2016-01-17 LAB — PROCALCITONIN: Procalcitonin: 0.48 ng/mL

## 2016-01-17 LAB — GLUCOSE, CAPILLARY
GLUCOSE-CAPILLARY: 208 mg/dL — AB (ref 65–99)
GLUCOSE-CAPILLARY: 199 mg/dL — AB (ref 65–99)
Glucose-Capillary: 178 mg/dL — ABNORMAL HIGH (ref 65–99)
Glucose-Capillary: 215 mg/dL — ABNORMAL HIGH (ref 65–99)

## 2016-01-17 LAB — CBC
HEMATOCRIT: 28.6 % — AB (ref 36.0–46.0)
Hemoglobin: 8.7 g/dL — ABNORMAL LOW (ref 12.0–15.0)
MCH: 28.6 pg (ref 26.0–34.0)
MCHC: 30.4 g/dL (ref 30.0–36.0)
MCV: 94.1 fL (ref 78.0–100.0)
Platelets: 250 10*3/uL (ref 150–400)
RBC: 3.04 MIL/uL — AB (ref 3.87–5.11)
RDW: 14.8 % (ref 11.5–15.5)
WBC: 8.9 10*3/uL (ref 4.0–10.5)

## 2016-01-17 LAB — BASIC METABOLIC PANEL
ANION GAP: 14 (ref 5–15)
BUN: 33 mg/dL — ABNORMAL HIGH (ref 6–20)
CO2: 21 mmol/L — AB (ref 22–32)
Calcium: 8.8 mg/dL — ABNORMAL LOW (ref 8.9–10.3)
Chloride: 106 mmol/L (ref 101–111)
Creatinine, Ser: 0.89 mg/dL (ref 0.44–1.00)
GFR calc Af Amer: >60 mL/min (ref >60)
GFR calc non Af Amer: >60 mL/min (ref >60)
GLUCOSE: 227 mg/dL — AB (ref 65–99)
POTASSIUM: 4.2 mmol/L (ref 3.5–5.1)
Sodium: 141 mmol/L (ref 135–145)

## 2016-01-17 MED ORDER — DEXTROSE 5 % IV SOLN
2.0000 g | INTRAVENOUS | Status: AC
  Administered 2016-01-17 – 2016-01-18 (×2): 2 g via INTRAVENOUS
  Filled 2016-01-17 (×2): qty 2

## 2016-01-17 MED ORDER — VITAMINS A & D EX OINT
TOPICAL_OINTMENT | CUTANEOUS | Status: AC
  Administered 2016-01-17: 21:00:00
  Filled 2016-01-17: qty 5

## 2016-01-17 MED ORDER — FLUCONAZOLE IN SODIUM CHLORIDE 100-0.9 MG/50ML-% IV SOLN
100.0000 mg | INTRAVENOUS | Status: DC
  Administered 2016-01-17 – 2016-01-23 (×7): 100 mg via INTRAVENOUS
  Filled 2016-01-17 (×7): qty 50

## 2016-01-17 MED ORDER — ACETAMINOPHEN 10 MG/ML IV SOLN
1000.0000 mg | Freq: Once | INTRAVENOUS | Status: AC
  Administered 2016-01-17: 1000 mg via INTRAVENOUS
  Filled 2016-01-17 (×2): qty 100

## 2016-01-17 NOTE — Progress Notes (Signed)
Pharmacy Antibiotic Follow-up Note  Valerie [name] is a 70 y.o. year-old female admitted on 01/14/2016.  The patient is currently on day 3 of Vancomycin and Zosyn for cellulitis and intra-abdominal infection.  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  Assessment/Plan:  After discussion with Valerie Norris, NP, the current antibiotic(s) Vancomycin and Zosyn will be narrowed to Zosyn alone.  She anticipates a duration of 5 days and a stop date will be entered when appropriate.  D/C Vancomycin  Continue Zosyn 3.375g IV Q8H infused over 4hrs.   Follow up renal fxn, culture results, and clinical course.    Temp (24hrs), Avg:98.1 F (36.7 C), Min:97.5 F (36.4 C), Max:99.1 F (37.3 C)   Recent Labs Lab 01/14/16 2029 01/15/16 0142 01/15/16 1628 01/16/16 0352 01/17/16 0510  WBC 9.1 7.0 12.3* 7.6 8.9    Recent Labs Lab 01/14/16 2029 01/15/16 0142 01/15/16 1628 01/16/16 0352 01/17/16 0510  CREATININE 2.15* 2.40* 1.56* 1.57* 0.89   Estimated Creatinine Clearance: 78 mL/min (by C-G formula based on Cr of 0.89).    No Known Allergies  Antimicrobials this admission: 1/18 >>zosyn  >> 1/18 >>vancomycin  >>  1/20 1/20 >> fluconazole >>  Levels/dose changes this admission: 1/19 SCr improved, vanc dose increased to 1500 mg IV q24h per nomogram.  Microbiology results: 1/18 blood: ngtd 1/18 urine: 50,000 colonies of yeast  1/18 MRSA PCR negative  Thank you for allowing pharmacy to be a part of this patient's care. Valerie [name] PharmD, BCPS Pager [phone] 01/17/2016 12:04 PM

## 2016-01-17 NOTE — Progress Notes (Signed)
Inpatient Diabetes Program Recommendations  AACE/ADA: New Consensus Statement on Inpatient Glycemic Control (2015)  Target Ranges:  Prepandial:   less than 140 mg/dL      Peak postprandial:   less than 180 mg/dL (1-2 hours)      Critically ill patients:  140 - 180 mg/dL   Results for TIFANIE, GARDINER (MRN [id number]) as of 01/17/2016 13:16  Ref. Range 01/15/2016 23:53 01/16/2016 04:07 01/16/2016 07:46 01/16/2016 11:43 01/16/2016 13:20 01/16/2016 15:46 01/16/2016 17:31 01/16/2016 20:01  Glucose-Capillary Latest Ref Range: 65-99 mg/dL 409 (H) 811 (H) 914 (H) 233 (H) 208 (H) 210 (H) 223 (H) 226 (H)    Results for GIAVANNA, KANG (MRN [id number]) as of 01/17/2016 13:16  Ref. Range 01/16/2016 23:06 01/17/2016 03:54 01/17/2016 07:53  Glucose-Capillary Latest Ref Range: 65-99 mg/dL 086 (H) 578 (H) 469 (H)    Review of Glycemic Control  Diabetes history: DM2  Home DM meds: Humulin R U-500 20-40 units TID (CBG of 150 or above take 20 units, 200 or above take 30 units, if 300 or above take 40units)  Current Insulin Orders: Lantus 10 units QHS      Novolog Moderate SSI (0-15 units) Q4H    MD- Please consider the following in-hospital insulin adjustments:  1. Increase Lantus to 20 units QHS (0.15 units/kg dosing)  2. Continue Novolog Moderate SSI Q4 hours- May consider increasing to Resistant scale (0-20 units) if patient continues to have hyperglycemia     --Will follow patient during hospitalization--  Ambrose Finland RN, MSN, CDE Diabetes Coordinator Inpatient Glycemic Control Team Team Pager: [phone] (8a-5p)

## 2016-01-17 NOTE — Progress Notes (Signed)
Patient ID: Valerie [name], female   DOB: January 31, 1946, 70 y.o.   MRN: [id number]     [facility]      [address] [place], [place] [address]    Phone: [phone] FAX: [phone]     Subjective: Only sat up yesterday. Pulling 1039m on IS Afebrile.  Mild tachycardia and hypertensive.  Likely due to pain. Good UOP Labs reviewed, WBC normal.  No nausea or vomiting, minimal NGT output with ice chips.   Objective:  Vital signs:  Filed Vitals:   01/17/16 0317 01/17/16 0356 01/17/16 0400 01/17/16 0500  BP: 180/88  150/60 159/64  Pulse:   107 109  Temp:  97.6 F (36.4 C)    TempSrc:  Oral    Resp:   18 19  Height:      Weight:    130.9 kg (288 lb 9.3 oz)  SpO2:   100% 100%    Last BM Date: 01/10/16  Intake/Output   Yesterday:  01/19 0701 - 01/20 0700 In: 2825 [I.V.:2175; IV Piggyback:650] Out: 16948[Urine:1025; Emesis/NG output:145; Drains:155] This shift:     Physical Exam: General: Pt awake/alert/oriented x4 in no acute distress Lungs: cta bilaterally.  IS 10069m Cards: S1S2 rrr, no murmurs gallops or rubs. No edema.  Abdomen: Soft. Nondistended. Tender. Midline VAC in place, large defect, serosanguinous output, staples are intact, no surrounding erythema.  Ext: no edema.  bil SCDs.   Problem List:   Principal Problem:   SBO (small bowel obstruction) (HCC) Active Problems:   Uncontrolled type 2 diabetes mellitus ([facility]  Acute kidney injury ([facility]  UTI (lower urinary tract infection)   Hypotension   Cellulitis of left lower extremity    Results:   Labs: Results for orders placed or performed during the hospital encounter of 01/14/16 (from the past 48 hour(s))  Glucose, capillary     Status: Abnormal   Collection Time: 01/15/16  7:53 AM  Result Value Ref Range   Glucose-Capillary 202 (H) 65 - 99 mg/dL  Urine culture     Status: None   Collection Time: 01/15/16 10:21 AM  Result Value Ref Range   Specimen Description URINE, CLEAN CATCH    Special Requests NONE    Culture      50,000 COLONIES/mL YEAST [facility]  Report Status 01/16/2016 FINAL   Surgical pcr screen     Status: None   Collection Time: 01/15/16 10:21 AM  Result Value Ref Range   MRSA, PCR NEGATIVE NEGATIVE   Staphylococcus aureus NEGATIVE NEGATIVE    Comment:        The Xpert SA Assay (FDA approved for NASAL specimens in patients over 216ears of age), is one component of a comprehensive surveillance program.  Test performance has been validated by [facility] greater than or equal to 1 78ear old. It is not intended to diagnose infection nor to guide or monitor treatment.   Glucose, capillary     Status: Abnormal   Collection Time: 01/15/16 11:48 AM  Result Value Ref Range   Glucose-Capillary 186 (H) 65 - 99 mg/dL   Comment 1 Document in Chart   Type and screen [facility]   Status: None   Collection Time: 01/15/16 11:50 AM  Result Value Ref Range   ABO/RH(D) O POS    Antibody Screen NEG    Sample Expiration 01/18/2016   ABO/Rh  Status: None   Collection Time: 01/15/16 11:50 AM  Result Value Ref Range   ABO/RH(D) O POS   Glucose, capillary     Status: Abnormal   Collection Time: 01/15/16 12:53 PM  Result Value Ref Range   Glucose-Capillary 195 (H) 65 - 99 mg/dL  Glucose, capillary     Status: Abnormal   Collection Time: 01/15/16  2:26 PM  Result Value Ref Range   Glucose-Capillary 212 (H) 65 - 99 mg/dL  Glucose, capillary     Status: Abnormal   Collection Time: 01/15/16  4:16 PM  Result Value Ref Range   Glucose-Capillary 228 (H) 65 - 99 mg/dL  CBC     Status: Abnormal   Collection Time: 01/15/16  4:28 PM  Result Value Ref Range   WBC 12.3 (H) 4.0 - 10.5 K/uL   RBC 3.31 (L) 3.87 - 5.11 MIL/uL   Hemoglobin 9.5 (L) 12.0 - 15.0 g/dL   HCT 30.6 (L) 36.0 - 46.0 %   MCV 92.4 78.0 - 100.0 fL   MCH 28.7 26.0 - 34.0 pg   MCHC 31.0  30.0 - 36.0 g/dL   RDW 14.6 11.5 - 15.5 %   Platelets 277 150 - 400 K/uL  Basic metabolic panel     Status: Abnormal   Collection Time: 01/15/16  4:28 PM  Result Value Ref Range   Sodium 138 135 - 145 mmol/L   Potassium 4.1 3.5 - 5.1 mmol/L   Chloride 104 101 - 111 mmol/L   CO2 22 22 - 32 mmol/L   Glucose, Bld 281 (H) 65 - 99 mg/dL   BUN 34 (H) 6 - 20 mg/dL   Creatinine, Ser 1.56 (H) 0.44 - 1.00 mg/dL   Calcium 8.2 (L) 8.9 - 10.3 mg/dL   GFR calc non Af Amer 32 (L) >60 mL/min   GFR calc Af Amer 37 (L) >60 mL/min    Comment: (NOTE) The eGFR has been calculated using the CKD EPI equation. This calculation has not been validated in all clinical situations. eGFR's persistently <60 mL/min signify possible Chronic Kidney Disease.    Anion gap 12 5 - 15  Glucose, capillary     Status: Abnormal   Collection Time: 01/15/16  6:17 PM  Result Value Ref Range   Glucose-Capillary 290 (H) 65 - 99 mg/dL  Glucose, capillary     Status: Abnormal   Collection Time: 01/15/16  8:52 PM  Result Value Ref Range   Glucose-Capillary 237 (H) 65 - 99 mg/dL  Glucose, capillary     Status: Abnormal   Collection Time: 01/15/16 11:53 PM  Result Value Ref Range   Glucose-Capillary 223 (H) 65 - 99 mg/dL  CBC     Status: Abnormal   Collection Time: 01/16/16  3:52 AM  Result Value Ref Range   WBC 7.6 4.0 - 10.5 K/uL   RBC 4.04 3.87 - 5.11 MIL/uL   Hemoglobin 11.3 (L) 12.0 - 15.0 g/dL   HCT 36.9 36.0 - 46.0 %   MCV 91.3 78.0 - 100.0 fL   MCH 28.0 26.0 - 34.0 pg   MCHC 30.6 30.0 - 36.0 g/dL   RDW 14.4 11.5 - 15.5 %   Platelets 210 150 - 400 K/uL  Basic metabolic panel     Status: Abnormal   Collection Time: 01/16/16  3:52 AM  Result Value Ref Range   Sodium 138 135 - 145 mmol/L   Potassium 4.4 3.5 - 5.1 mmol/L   Chloride 104 101 - 111 mmol/L  CO2 23 22 - 32 mmol/L   Glucose, Bld 220 (H) 65 - 99 mg/dL   BUN 35 (H) 6 - 20 mg/dL   Creatinine, Ser 1.57 (H) 0.44 - 1.00 mg/dL   Calcium 8.4 (L) 8.9 -  10.3 mg/dL   GFR calc non Af Amer 32 (L) >60 mL/min   GFR calc Af Amer 37 (L) >60 mL/min    Comment: (NOTE) The eGFR has been calculated using the CKD EPI equation. This calculation has not been validated in all clinical situations. eGFR's persistently <60 mL/min signify possible Chronic Kidney Disease.    Anion gap 11 5 - 15  Glucose, capillary     Status: Abnormal   Collection Time: 01/16/16  4:07 AM  Result Value Ref Range   Glucose-Capillary 202 (H) 65 - 99 mg/dL   Comment 1 Notify RN   Glucose, capillary     Status: Abnormal   Collection Time: 01/16/16  7:46 AM  Result Value Ref Range   Glucose-Capillary 201 (H) 65 - 99 mg/dL  Glucose, capillary     Status: Abnormal   Collection Time: 01/16/16 11:43 AM  Result Value Ref Range   Glucose-Capillary 233 (H) 65 - 99 mg/dL  Glucose, capillary     Status: Abnormal   Collection Time: 01/16/16  1:20 PM  Result Value Ref Range   Glucose-Capillary 208 (H) 65 - 99 mg/dL  Glucose, capillary     Status: Abnormal   Collection Time: 01/16/16  3:46 PM  Result Value Ref Range   Glucose-Capillary 210 (H) 65 - 99 mg/dL  Glucose, capillary     Status: Abnormal   Collection Time: 01/16/16  5:31 PM  Result Value Ref Range   Glucose-Capillary 223 (H) 65 - 99 mg/dL  Glucose, capillary     Status: Abnormal   Collection Time: 01/16/16  8:01 PM  Result Value Ref Range   Glucose-Capillary 226 (H) 65 - 99 mg/dL   Comment 1 Notify RN   Glucose, capillary     Status: Abnormal   Collection Time: 01/16/16 11:06 PM  Result Value Ref Range   Glucose-Capillary 208 (H) 65 - 99 mg/dL  Glucose, capillary     Status: Abnormal   Collection Time: 01/17/16  3:54 AM  Result Value Ref Range   Glucose-Capillary 178 (H) 65 - 99 mg/dL   Comment 1 Notify RN   Procalcitonin     Status: None   Collection Time: 01/17/16  5:10 AM  Result Value Ref Range   Procalcitonin 0.48 ng/mL    Comment:        Interpretation: PCT (Procalcitonin) <= 0.5 ng/mL: Systemic  infection (sepsis) is not likely. Local bacterial infection is possible. (NOTE)         ICU PCT Algorithm               Non ICU PCT Algorithm    ----------------------------     ------------------------------         PCT < 0.25 ng/mL                 PCT < 0.1 ng/mL     Stopping of antibiotics            Stopping of antibiotics       strongly encouraged.               strongly encouraged.    ----------------------------     ------------------------------       PCT level decrease by  PCT < 0.25 ng/mL       >= 80% from peak PCT       OR PCT 0.25 - 0.5 ng/mL          Stopping of antibiotics                                             encouraged.     Stopping of antibiotics           encouraged.    ----------------------------     ------------------------------       PCT level decrease by              PCT >= 0.25 ng/mL       < 80% from peak PCT        AND PCT >= 0.5 ng/mL            Continuin g antibiotics                                              encouraged.       Continuing antibiotics            encouraged.    ----------------------------     ------------------------------     PCT level increase compared          PCT > 0.5 ng/mL         with peak PCT AND          PCT >= 0.5 ng/mL             Escalation of antibiotics                                          strongly encouraged.      Escalation of antibiotics        strongly encouraged.   CBC     Status: Abnormal   Collection Time: 01/17/16  5:10 AM  Result Value Ref Range   WBC 8.9 4.0 - 10.5 K/uL   RBC 3.04 (L) 3.87 - 5.11 MIL/uL   Hemoglobin 8.7 (L) 12.0 - 15.0 g/dL    Comment: DELTA CHECK NOTED REPEATED TO VERIFY    HCT 28.6 (L) 36.0 - 46.0 %   MCV 94.1 78.0 - 100.0 fL   MCH 28.6 26.0 - 34.0 pg   MCHC 30.4 30.0 - 36.0 g/dL   RDW 14.8 11.5 - 15.5 %   Platelets 250 150 - 400 K/uL  Basic metabolic panel     Status: Abnormal   Collection Time: 01/17/16  5:10 AM  Result Value Ref Range   Sodium 141 135 -  145 mmol/L   Potassium 4.2 3.5 - 5.1 mmol/L   Chloride 106 101 - 111 mmol/L   CO2 21 (L) 22 - 32 mmol/L   Glucose, Bld 227 (H) 65 - 99 mg/dL   BUN 33 (H) 6 - 20 mg/dL   Creatinine, Ser 0.89 0.44 - 1.00 mg/dL   Calcium 8.8 (L) 8.9 - 10.3 mg/dL   GFR calc non Af Amer >60 >60 mL/min   GFR calc Af Amer >60 >60 mL/min    Comment: (NOTE) The eGFR has  been calculated using the CKD EPI equation. This calculation has not been validated in all clinical situations. eGFR's persistently <60 mL/min signify possible Chronic Kidney Disease.    Anion gap 14 5 - 15    Imaging / Studies: No results found.  Medications / Allergies:  Scheduled Meds: . chlorhexidine gluconate  15 mL Mouth/Throat QID  . fluticasone  2 spray Each Nare Daily  . heparin  5,000 Units Subcutaneous 3 times per day  . insulin aspart  0-15 Units Subcutaneous 6 times per day  . insulin glargine  10 Units Subcutaneous QHS  . pantoprazole (PROTONIX) IV  40 mg Intravenous QHS  . piperacillin-tazobactam (ZOSYN)  IV  3.375 g Intravenous 3 times per day  . vancomycin  1,500 mg Intravenous Q24H   Continuous Infusions: . sodium chloride 75 mL/hr at 01/16/16 1257   PRN Meds:.hydrALAZINE, HYDROmorphone (DILAUDID) injection, ondansetron **OR** ondansetron (ZOFRAN) IV, phenol  Antibiotics: Anti-infectives    Start     Dose/Rate Route Frequency Ordered Stop   01/17/16 0600  vancomycin (VANCOCIN) 1,750 mg in sodium chloride 0.9 % 500 mL IVPB  Status:  Discontinued     1,750 mg 250 mL/hr over 120 Minutes Intravenous Every 48 hours 01/15/16 0146 01/16/16 1126   01/16/16 1300  vancomycin (VANCOCIN) 1,500 mg in sodium chloride 0.9 % 500 mL IVPB     1,500 mg 250 mL/hr over 120 Minutes Intravenous Every 24 hours 01/16/16 1125     01/16/16 0600  cefoTEtan (CEFOTAN) 2 g in dextrose 5 % 50 mL IVPB  Status:  Discontinued     2 g 100 mL/hr over 30 Minutes Intravenous On call to O.R. 01/15/16 1700 01/15/16 1709   01/15/16 1130  cefTRIAXone  (ROCEPHIN) 1 g in dextrose 5 % 50 mL IVPB  Status:  Discontinued     1 g 100 mL/hr over 30 Minutes Intravenous Every 24 hours 01/15/16 1123 01/15/16 1124   01/15/16 0200  piperacillin-tazobactam (ZOSYN) IVPB 3.375 g     3.375 g 12.5 mL/hr over 240 Minutes Intravenous 3 times per day 01/15/16 0049     01/15/16 0130  vancomycin (VANCOCIN) 2,500 mg in sodium chloride 0.9 % 500 mL IVPB     2,500 mg 250 mL/hr over 120 Minutes Intravenous  Once 01/15/16 0055 01/15/16 0342   01/14/16 2230  cefTRIAXone (ROCEPHIN) 1 g in dextrose 5 % 50 mL IVPB     1 g 100 mL/hr over 30 Minutes Intravenous  Once 01/14/16 2226 01/14/16 2326       Assessment/Plan POD#2 exploratory laparotomy, LOA, extensive debridement of skin and sq tissue and muscle, repair of incarcerated incisional hernia with biologic mesh, VAC application---Dr. Dalbert Batman -continue NGT decompression, bowel rest -PT eval, OOB as tolerated -VAC changes MWF, will eval today with WOC Renal insufficiency-stable  DM II-CBGs improving.  SSI/CBG/lantus  FEN-NPO x ice chips. Dilaudid, renew tylenol VTE prophylaxis-SCD/heparin ID-zosyn/Vanc D#2 Dispo-may transfer to floor from a surgical standpoint    Erby Pian, [facility] Pager [phone])   01/17/2016 7:31 AM

## 2016-01-17 NOTE — Consult Note (Signed)
WOC wound consult note Reason for Consult: Placement of NPWT dressing. Dr. Derrell Lolling in today with NP Anette Riedel, NP prior to my visit.  Dr. Derrell Lolling back in this afternoon immediately following dressing change.  Wound type: Surgical Pressure Ulcer POA: No Measurement:15cm x 24cm x 6cm with deepest depths from 5-8 o'clock. Wound bed:Red, moist with circumferential elevation at 12'o'clock.  This area is protected with mepitel silicone wound contact layer prior to foam placement. Drainage (amount, consistency, odor) Serosanguinous Periwound:intact with minor maceration located at 5 o'clock. Dressing procedure/placement/frequency: NPWT dressing applied after NS cleanse.  Seven (7) pieces of black foam and one (1) piece of Mepitel (placed at 12 o'clock) used as wound contact layers, drape applied and attached to NPWT at continuous negative pressure.  A seal is achieved on the second attempt.  Next dressing change is due on Monday, 01/20/16. WOC nursing team will follow at intervals, but will remain available to this patient, the nursing and medical teams. Please call if needed between visits. Thanks, Ladona Mow, MSN, RN, GNP, [facility] [place]  Pager# [phone]

## 2016-01-17 NOTE — Progress Notes (Signed)
PT Cancellation Note  Patient Details Name: Valerie [name] MRN: [id number] DOB: 10-26-1946   Cancelled Treatment:    Reason Eval/Treat Not Completed: Other (comment) Pt refused PT this am d/t wanting to take  "a call from [facility], I need to have my privacy"; offered to allow pt her call and begin room set up/ask husband questions and she continued to refuse; unable to return this date d/t caseload; will attempt again as soon as schedule permits.    [facility] 01/17/2016, 2:49 PM

## 2016-01-17 NOTE — Progress Notes (Signed)
[id number]/FAOZHY Fiza Nation,BSN,CCM,RN  [id number]: Chart review for u.r. And dc needs/POD-2/npo, iv flds, wound vac.  o2 at 4l./min [place]

## 2016-01-17 NOTE — Progress Notes (Signed)
Patient ID: Valerie [name], female   DOB: [birth year], 70 y.o.   MRN: [id number] [facility] PROGRESS NOTE  Valerie [name] WUJ:[id number] DOB: 1946/11/03 DOA: 01/14/2016 PCP: Garth Schlatter, MD  Brief narrative:    70 y.o. female with past medical history significant for overactive bladder, chronic UTIs, necrotizing pancreatitis status post partial pancreatectomy, morbid obesity, type 2 diabetes mellitus, just recently discharged after being hospitalized for small bowel obstruction. Patient presented to [facility] long hospital 01/14/2016 with worsening nausea, vomiting and abdominal discomfort for past day prior to this admission. CT abdomen and pelvis on the admission demonstrated small bowel obstruction with proximal ileum entrapment in the ventral hernia.  Patient is status post exploratory laparotomy, LOA, extensive debridement of skin and sq tissue and muscle, repair of incarcerated incisional hernia with biologic mesh, VAC application by Dr. Derrell Lolling 01/15/2016 for Incarcerated incisional hernias, multiple, with small bowel obstruction ischemic fat necrosis of skin and subcutaneous tissue and fascia. She has midline VAC placed and NG tube, managed by surgery.  Assessment/Plan:     Principal problem: Exploratory laparotomy / repair of incarcerated incisional hernia / Leukocytosis  - CT abdomen and pelvis on the admission demonstrated small bowel obstruction with proximal ileum entrapment in the ventral hernia - Status post exploratory laparotomy, LOA, extensive debridement of skin and sq tissue and muscle, repair of incarcerated incisional hernia with biologic mesh, VAC application by Dr. Derrell Lolling 01/15/2016 for incarcerated incisional hernias, multiple, with small bowel obstruction ischemic fat necrosis of skin and subcutaneous tissue and fascia.  - Management per surgery - She is on broad spectrum abx, vanco and zosyn. WBC count now WNL. - Blood cultures so far negative - Continue VAC  dressing changes MWF - Continue NG tube until SO resolves, keep NPO - Continue supportive care with IV fluids for hydration  Principal problem: Urinary tract infection, yeast  - Has had Klebsiella UTI during recent hospitalization. Urine culture on this admission growing yeast so will add fluconazole  Acute renal failure superimposed on chronic kidney disease stage 4 - Baseline creatinine is 1.34 and on this admission acutely elevated at 2.15 likely in the setting of acute illness - Creatinine now WNL, improved likely with hydration   Anemia of chronic disease / Acute blood loss anemia  - Due to combination of CKD and postoperative blood loss anemia  - Hemoglobin 8.7 today, down from 11.3 yesterday, possible blood loss anemia postoperatively - Check CBC in am - Transfuse if Hgb less than 8  Dyslipidemia associated with diabetes mellitus type 2 - Resume home meds Lipitor 10 mg at bedtime, omega 3 and fenofibrate when able to tolerate solids   Essential hypertension, benign - Hydralazine 10 mg IV every 4 hours as needed for BP above 150/90   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 - Has NG tube, NPO  DVT Prophylaxis  - Heparin subQ while pt in hospital   Code Status: Full.  Family Communication:  plan of care discussed with the patient Disposition Plan: Remains in SDU, home once cleared by surgery    IV access:  Peripheral IV  Procedures and diagnostic studies:    Exploratory laparotomy, LOA, extensive debridement of skin and sq tissue and muscle, repair of  incarcerated incisional hernia with biologic mesh, VAC application---Dr. Derrell Lolling 01/15/2016 for Incarcerated incisional hernias, multiple, with small  bowel obstruction Ischemic fat necrosis of skin and subcutaneous tissue and fascia.  Ct Abdomen Pelvis Wo Contrast 01/14/2016  1. Small bowel obstruction related to entrapment of proximal ileum in a small ventral hernia. Proximal small bowel is dilated with multiple air-fluid levels measuring up to 5.6 cm in diameter. Distal small bowel is completely decompressed, and colon is largely decompressed as well. Emergent surgical consultation is strongly recommended. 2. Extensive airspace disease in the lung bases bilaterally (left greater than right), concerning for potential aspiration pneumonia. 3. Cholelithiasis without evidence of acute cholecystitis at this time. 4. Chronic mild right hydronephrosis with urothelial thickening in the right renal collecting system, poorly evaluated on today's noncontrast CT examination, but similar in retrospect to prior studies. Nonemergent Urology consultation is recommended for further evaluation. 5. Atherosclerosis, including right coronary artery disease. 6. There are calcifications of the mitral valve and mitral annulus. Echocardiographic correlation for evaluation of potential valvular dysfunction may be warranted if clinically indicated. 7. Additional incidental findings, as above. Electronically Signed   By: Trudie Reed M.D.   On: 01/14/2016 22:13   Medical Consultants:  Surgery, Dr. Claud Kelp  Other Consultants:  DM coordinator  IAnti-Infectives:   Vanco ans zosyn 01/15/2016 -->  Debbora Presto, MD [facility] Pager [phone]  Time spent in minutes: 25 minutes  If 7PM-7AM, please contact night-coverage [email or web address] Password [email or web address] 01/17/2016, 10:02 AM   LOS: 3 days    HPI/Subjective: No acute overnight events. Patient reports feeling tired.   Objective: Filed Vitals:   01/17/16 0500 01/17/16 0700 01/17/16 0800 01/17/16 1000  BP: 159/64 177/66 151/71   Pulse: 109 106 109 101  Temp:   98.1 F (36.7 C)   TempSrc:    Oral   Resp: Height:      Weight: 288 lb 9.3 oz (130.9 kg)     SpO2: 100% 97% 98% 100%    Intake/Output Summary (Last 24 hours) at 01/17/16 1002 Last data filed at 01/17/16 0815  Gross per 24 hour  Intake   2450 ml  Output   1525 ml  Net    925 ml    Exam:   General:  Pt is not in acute distress  Cardiovascular: Rate controlled, appreciate S1, S2   Respiratory: Clear to auscultation bilaterally, no wheezing, no crackles, no rhonchi  Abdomen: VACin place, NG in place, tender in mid abdomen with voluntary guarding  Extremities: No leg swelling, pulses palpable   Neuro: Nonfocal  Data Reviewed: Basic Metabolic Panel:  Recent Labs Lab 01/14/16 2029 01/15/16 0142 01/15/16 1628 01/16/16 0352 01/17/16 0510  NA 139 136 138 138 141  K 4.2 3.9 4.1 4.4 4.2  CL 100* 97* 104 104 106  CO2 21*  GLUCOSE 282* 292* 281* 220* 227*  BUN 31* 35* 34* 35* 33*  CREATININE 2.15* 2.40* 1.56* 1.57* 0.89  CALCIUM 9.8 8.8* 8.2* 8.4* 8.8*   Liver Function Tests:  Recent Labs Lab 01/14/16 2029 01/15/16 0142  AST 19 16  ALT 13* 13*  ALKPHOS 61 61  BILITOT 1.0 1.1  PROT 6.8 6.5  ALBUMIN 3.0* 2.7*    Recent Labs Lab 01/14/16 2029  LIPASE 24   No results for input(s): AMMONIA in the last 168 hours. CBC:  Recent Labs Lab 01/14/16 2029 01/15/16 0142 01/15/16 1628 01/16/16 0352 01/17/16 0510  WBC 9.1 7.0 12.3*  7.6 8.9  NEUTROABS 8.0* 6.0  --   --   --   HGB 10.3* 9.7* 9.5* 11.3* 8.7*  HCT 33.2* 31.3* 30.6* 36.9 28.6*  MCV 91.5 92.9 92.4 91.3 94.1  PLT 254 225 277 210 250   Cardiac Enzymes: No results for input(s): CKTOTAL, CKMB, CKMBINDEX, TROPONINI in the last 168 hours. BNP: Invalid input(s): POCBNP CBG:  Recent Labs Lab 01/16/16 1731 01/16/16 2001 01/16/16 2306 01/17/16 0354 01/17/16 0753  GLUCAP 223* 226* 208* 178* 208*    Recent Results (from the past 240 hour(s))  Culture, blood (routine x 2)     Status: None (Preliminary  result)   Collection Time: 01/15/16  1:40 AM  Result Value Ref Range Status   Specimen Description BLOOD RIGHT HAND  Final   Special Requests BOTTLES DRAWN AEROBIC ONLY 5CC  Final   Culture   Final    NO GROWTH 1 DAY Performed at [facility]    Report Status PENDING  Incomplete  Culture, blood (routine x 2)     Status: None (Preliminary result)   Collection Time: 01/15/16  1:49 AM  Result Value Ref Range Status   Specimen Description BLOOD LEFT HAND  Final   Special Requests BOTTLES DRAWN AEROBIC ONLY 5CC  Final   Culture   Final    NO GROWTH 1 DAY Performed at [facility]    Report Status PENDING  Incomplete  Urine culture     Status: None   Collection Time: 01/15/16 10:21 AM  Result Value Ref Range Status   Specimen Description URINE, CLEAN CATCH  Final   Special Requests NONE  Final   Culture   Final    50,000 COLONIES/mL YEAST Performed at [facility]    Report Status 01/16/2016 FINAL  Final  Surgical pcr screen     Status: None   Collection Time: 01/15/16 10:21 AM  Result Value Ref Range Status   MRSA, PCR NEGATIVE NEGATIVE Final   Staphylococcus aureus NEGATIVE NEGATIVE Final    Comment:        The Xpert SA Assay (FDA approved for NASAL specimens in patients over 78 years of age), is one component of a comprehensive surveillance program.  Test performance has been validated by [facility] for patients greater than or equal to 89 year old. It is not intended to diagnose infection nor to guide or monitor treatment.      Scheduled Meds: . chlorhexidine gluconate  15 mL Mouth/Throat QID  . fluticasone  2 spray Each Nare Daily  . heparin  5,000 Units Subcutaneous 3 times per day  . insulin aspart  0-15 Units Subcutaneous 6 times per day  . insulin glargine  10 Units Subcutaneous QHS  . pantoprazole (PROTONIX) IV  40 mg Intravenous QHS  . piperacillin-tazobactam (ZOSYN)  IV  3.375 g Intravenous 3 times per day  . vancomycin  1,500 mg  Intravenous Q24H   Continuous Infusions: . sodium chloride 75 mL/hr at 01/17/16 [phone]

## 2016-01-18 LAB — CBC
HEMATOCRIT: 26.1 % — AB (ref 36.0–46.0)
Hemoglobin: 7.9 g/dL — ABNORMAL LOW (ref 12.0–15.0)
MCH: 27.9 pg (ref 26.0–34.0)
MCHC: 30.3 g/dL (ref 30.0–36.0)
MCV: 92.2 fL (ref 78.0–100.0)
Platelets: 241 10*3/uL (ref 150–400)
RBC: 2.83 MIL/uL — AB (ref 3.87–5.11)
RDW: 14.5 % (ref 11.5–15.5)
WBC: 9.4 10*3/uL (ref 4.0–10.5)

## 2016-01-18 LAB — GLUCOSE, CAPILLARY
GLUCOSE-CAPILLARY: 191 mg/dL — AB (ref 65–99)
GLUCOSE-CAPILLARY: 170 mg/dL — AB (ref 65–99)
GLUCOSE-CAPILLARY: 201 mg/dL — AB (ref 65–99)
GLUCOSE-CAPILLARY: 183 mg/dL — AB (ref 65–99)
GLUCOSE-CAPILLARY: 172 mg/dL — AB (ref 65–99)
Glucose-Capillary: 194 mg/dL — ABNORMAL HIGH (ref 65–99)
Glucose-Capillary: 169 mg/dL — ABNORMAL HIGH (ref 65–99)

## 2016-01-18 LAB — BASIC METABOLIC PANEL
ANION GAP: 11 (ref 5–15)
BUN: 29 mg/dL — AB (ref 6–20)
CHLORIDE: 110 mmol/L (ref 101–111)
CO2: 24 mmol/L (ref 22–32)
Calcium: 9.1 mg/dL (ref 8.9–10.3)
Creatinine, Ser: 0.79 mg/dL (ref 0.44–1.00)
GFR calc Af Amer: >60 mL/min (ref >60)
GLUCOSE: 200 mg/dL — AB (ref 65–99)
POTASSIUM: 4.3 mmol/L (ref 3.5–5.1)
Sodium: 145 mmol/L (ref 135–145)

## 2016-01-18 LAB — MAGNESIUM: Magnesium: 1.7 mg/dL (ref 1.7–2.4)

## 2016-01-18 MED ORDER — INSULIN ASPART 100 UNIT/ML ~~LOC~~ SOLN
0.0000 [IU] | Freq: Three times a day (TID) | SUBCUTANEOUS | Status: DC
  Administered 2016-01-18 – 2016-01-19 (×2): 4 [IU] via SUBCUTANEOUS
  Administered 2016-01-19: 7 [IU] via SUBCUTANEOUS
  Administered 2016-01-19 – 2016-01-20 (×2): 4 [IU] via SUBCUTANEOUS
  Administered 2016-01-20 – 2016-01-21 (×4): 7 [IU] via SUBCUTANEOUS
  Administered 2016-01-21: 4 [IU] via SUBCUTANEOUS
  Administered 2016-01-22: 7 [IU] via SUBCUTANEOUS
  Administered 2016-01-22: 4 [IU] via SUBCUTANEOUS
  Administered 2016-01-22 – 2016-01-23 (×2): 11 [IU] via SUBCUTANEOUS
  Administered 2016-01-23: 15 [IU] via SUBCUTANEOUS
  Administered 2016-01-23: 7 [IU] via SUBCUTANEOUS
  Administered 2016-01-24: 4 [IU] via SUBCUTANEOUS
  Administered 2016-01-24: 7 [IU] via SUBCUTANEOUS
  Administered 2016-01-24: 4 [IU] via SUBCUTANEOUS
  Administered 2016-01-25 (×2): 7 [IU] via SUBCUTANEOUS
  Administered 2016-01-25: 11 [IU] via SUBCUTANEOUS
  Administered 2016-01-26: 4 [IU] via SUBCUTANEOUS
  Administered 2016-01-26 (×2): 7 [IU] via SUBCUTANEOUS
  Administered 2016-01-27 – 2016-01-28 (×5): 4 [IU] via SUBCUTANEOUS
  Administered 2016-01-28: 3 [IU] via SUBCUTANEOUS
  Administered 2016-01-29: 4 [IU] via SUBCUTANEOUS
  Administered 2016-01-29: 7 [IU] via SUBCUTANEOUS
  Administered 2016-01-29: 4 [IU] via SUBCUTANEOUS
  Administered 2016-01-30: 7 [IU] via SUBCUTANEOUS

## 2016-01-18 MED ORDER — CHLORHEXIDINE GLUCONATE 0.12 % MT SOLN
OROMUCOSAL | Status: AC
Start: 2016-01-18 — End: 2016-01-18
  Filled 2016-01-18: qty 15

## 2016-01-18 MED ORDER — INSULIN GLARGINE 100 UNIT/ML ~~LOC~~ SOLN
20.0000 [IU] | Freq: Every day | SUBCUTANEOUS | Status: DC
  Administered 2016-01-18 – 2016-01-29 (×12): 20 [IU] via SUBCUTANEOUS
  Filled 2016-01-18 (×12): qty 0.2

## 2016-01-18 NOTE — Evaluation (Signed)
Physical Therapy Evaluation Patient Details Name: Valerie [name] MRN: [id number] DOB: 06/20/1946 Today's Date: 01/18/2016   History of Present Illness  patient is 70 yo female admitted 01/14/16 is status post exploratory laparotomy, extensive debridement of skin and sq tissue and muscle, repair of incarcerated incisional hernia with biologic mesh, VAC application by Dr. Derrell Lolling 01/15/2016 for Incarcerated incisional hernias, multiple, with small bowel obstruction ischemic fat necrosis of skin and subcutaneous tissue and fascia. She has midline VAC placed and NG tube.  Clinical Impression  Patient was able to slowly mobilize with 2 persons to recliner with pivot transfer. Patient may benefit from SNF rehab at [place]. Mobility is much more difficult and painful than Preop. Pt admitted with above diagnosis. Pt currently with functional limitations due to the deficits listed below (see PT Problem List).  Pt will benefit from skilled PT to increase their independence and safety with mobility to allow discharge to the venue listed below.       Follow Up Recommendations SNF;Supervision/Assistance - 24 hour    Equipment Recommendations  None recommended by PT    Recommendations for Other Services       Precautions / Restrictions Precautions Precautions: Fall Precaution Comments: NGS, VAC      Mobility  Bed Mobility Overal bed mobility: Needs Assistance Bed Mobility: Rolling;Sidelying to Sit Rolling: Max assist;+2 for physical assistance;+2 for safety/equipment Sidelying to sit: +2 for physical assistance;+2 for safety/equipment;HOB elevated;Max assist       General bed mobility comments: increased time and use of B rails Assist for trunk to upright position  Transfers Overall transfer level: Needs assistance Equipment used: 2 person hand held assist Transfers: Sit to/from Stand Sit to Stand: Mod assist;+2 safety/equipment;+2 physical assistance         General transfer comment: extra  time , multimodal cues for reaching to armrest, small shuffle pivot steps to turn to the recliner, able to self reposition with partial stand.  Ambulation/Gait                Stairs            Wheelchair Mobility    Modified Rankin (Stroke Patients Only)       Balance                                             Pertinent Vitals/Pain Pain Assessment: 0-10 Pain Score: 7  Pain Location: abdomen Pain Descriptors / Indicators: Cramping;Discomfort;Dull;Grimacing;Guarding Pain Intervention(s): Limited activity within patient's tolerance;Premedicated before session;Repositioned    Home Living Family/patient expects to be discharged to:: Private residence   Available Help at Discharge: Family;Available 24 hours/day Type of Home: House Home Access: Stairs to enter   [employer] of Steps: 1 Home Layout: Multi-level Home Equipment: Walker - 4 wheels;Shower seat - built in      Prior Function Level of Independence: Independent;Independent with assistive device(s)         Comments: Will take rollator for shopping and longer distances in case she has to sit     Hand Dominance   Dominant Hand: Right    Extremity/Trunk Assessment   Upper Extremity Assessment: Generalized weakness           Lower Extremity Assessment: Generalized weakness         Communication   Communication: No difficulties  Cognition Arousal/Alertness: Awake/alert Behavior During Therapy: Anxious Overall Cognitive Status: Within  Functional Limits for tasks assessed                      General Comments      Exercises        Assessment/Plan    PT Assessment Patient needs continued PT services  PT Diagnosis Difficulty walking;Generalized weakness;Acute pain   PT Problem List Decreased activity tolerance;Decreased balance;Decreased mobility;Decreased strength  PT Treatment Interventions Gait training;Functional mobility  training;Therapeutic activities;Therapeutic exercise   PT Goals (Current goals can be found in the Care Plan section) Acute Rehab PT Goals Patient Stated Goal: feel better PT Goal Formulation: With patient/family Time For Goal Achievement: 02/01/16 Potential to Achieve Goals: Good    Frequency Min 3X/week   Barriers to discharge Inaccessible home environment split level    Co-evaluation               End of Session   Activity Tolerance: Patient limited by pain Patient left: in chair;with call bell/phone within reach;with family/visitor present;with nursing/sitter in room Nurse Communication: Mobility status         Time: 0826-0850 PT Time Calculation (min) (ACUTE ONLY): 24 min   Charges:   PT Evaluation $PT Eval Moderate Complexity: 1 Procedure PT Treatments $Therapeutic Activity: 8-22 mins   PT G Codes:        Rada Hay 01/18/2016, 9:45 AM  Blanchard Kelch PT [phone]

## 2016-01-18 NOTE — Progress Notes (Addendum)
Patient ID: Calianne Larue, female   DOB: 31-Jul-1946, 70 y.o.   MRN: [id number] [facility] PROGRESS NOTE  Jim Philemon [id number]:[id number] DOB: 1946-03-27 DOA: 01/14/2016 PCP: Garth Schlatter, MD  Brief narrative:    70 y.o. female with past medical history significant for overactive bladder, chronic UTIs, necrotizing pancreatitis status post partial pancreatectomy, morbid obesity, type 2 diabetes mellitus, just recently discharged after being hospitalized for small bowel obstruction. Patient presented to [facility] 01/14/2016 with worsening nausea, vomiting and abdominal discomfort for past day prior to this admission. CT abdomen and pelvis on the admission demonstrated small bowel obstruction with proximal ileum entrapment in the ventral hernia.  Patient is status post exploratory laparotomy, LOA, extensive debridement of skin and sq tissue and muscle, repair of incarcerated incisional hernia with biologic mesh, VAC application by Dr. Derrell Lolling 01/15/2016 for Incarcerated incisional hernias, multiple, with small bowel obstruction ischemic fat necrosis of skin and subcutaneous tissue and fascia. She has midline VAC placed and NG tube, managed by surgery.  Major events since admission: 1/18 - ex lap by Dr. Derrell Lolling, SDU post op 1/21 - improving slowly, NGT in place   Assessment/Plan:    Principal problem: Exploratory laparotomy / repair of incarcerated incisional hernia / Leukocytosis  - CT abdomen and pelvis on the admission demonstrated small bowel obstruction with proximal ileum entrapment in the ventral hernia - Status post exploratory laparotomy, LOA, extensive debridement of skin and sq tissue and muscle, repair of incarcerated incisional hernia with biologic mesh, VAC application by Dr. Derrell Lolling 01/15/2016 for incarcerated incisional hernias, multiple, with small bowel obstruction ischemic fat necrosis of skin and subcutaneous tissue and fascia.  - Management per surgery - She was  initially on broad spectrum abx, vanco and zosyn. Stopped 1/20 and transitioned to Rocephin IV 1/20 - Blood cultures so far negative - Continue VAC dressing changes MWF - Continue NG tube, keep NPO - Continue supportive care with analgesia and antiemetics as needed  - KUB in AM  Principal problem: Urinary tract infection, yeast  - Has had Klebsiella UTI during recent hospitalization. Urine culture on this admission growing yeast - continue Fluconzole day #2 (started 1/20)  Acute renal failure superimposed on chronic kidney disease stage 4 - Baseline creatinine is 1.34 and on this admission acutely elevated at 2.15 likely in the setting of acute illness - Creatinine now WNL - BMP in AM  Anemia of chronic disease / Acute post operative blood loss anemia  - Due to combination of CKD and postoperative blood loss anemia  - Hemoglobin 7.9 today, hold off on transfusion for now and transfuse if Hg < 7 - Check CBC in am  Dyslipidemia associated with diabetes mellitus type 2 - Resume home meds Lipitor 10 mg at bedtime, omega 3 and fenofibrate when able to tolerate solids   Essential hypertension, benign - Hydralazine 10 mg IV every 4 hours as needed for BP above 150/90   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 - Has NG tube, NPO  DVT Prophylaxis  - Heparin subQ while pt in hospital   Code Status: Full.  Family Communication:  plan of care discussed with the patient and family at bedside  Disposition Plan:  Remains in SDU, home once cleared by surgery    IV access:  Peripheral IV  Procedures and diagnostic studies:    Exploratory laparotomy, LOA, extensive debridement of skin and sq  tissue and muscle, repair of incarcerated incisional hernia with biologic mesh, VAC application---Dr. Derrell Lolling 01/15/2016 for Incarcerated incisional hernias, multiple, with small bowel obstruction Ischemic fat necrosis of skin and subcutaneous tissue and fascia.  Ct Abdomen Pelvis Wo Contrast 01/14/2016  1. Small bowel obstruction related to entrapment of proximal ileum in a small ventral hernia. Proximal small bowel is dilated with multiple air-fluid levels measuring up to 5.6 cm in diameter. Distal small bowel is completely decompressed, and colon is largely decompressed as well. Emergent surgical consultation is strongly recommended. 2. Extensive airspace disease in the lung bases bilaterally (left greater than right), concerning for potential aspiration pneumonia. 3. Cholelithiasis without evidence of acute cholecystitis at this time. 4. Chronic mild right hydronephrosis with urothelial thickening in the right renal collecting system, poorly evaluated on today's noncontrast CT examination, but similar in retrospect to prior studies. Nonemergent Urology consultation is recommended for further evaluation. 5. Atherosclerosis, including right coronary artery disease. 6. There are calcifications of the mitral valve and mitral annulus. Echocardiographic correlation for evaluation of potential valvular dysfunction may be warranted if clinically indicated. 7. Additional incidental findings, as above. Electronically Signed   By: Trudie Reed M.D.   On: 01/14/2016 22:13   Medical Consultants:  Surgery, Dr. Claud Kelp  Other Consultants:  DM coordinator PT  IAnti-Infectives:   Vanco ans zosyn 01/15/2016 -->  Debbora Presto, MD [facility] Pager [phone]  Time spent in minutes: 25 minutes  If 7PM-7AM, please contact night-coverage [email or web address] Password [email or web address] 01/18/2016, 2:26 PM   LOS: 4 days   HPI/Subjective: No acute overnight events. Patient reports feeling tired,  intermittent ab pain, 5/10 in severity.  Objective: Filed Vitals:   01/18/16 1130 01/18/16 1200 01/18/16 1300 01/18/16 1400  BP: 102/86     Pulse: 101 97 99 101  Temp:  98.3 F (36.8 C)    TempSrc:  Oral    Resp: Height:      Weight:      SpO2: 98% 96% 99% 99%    Intake/Output Summary (Last 24 hours) at 01/18/16 1426 Last data filed at 01/18/16 1400  Gross per 24 hour  Intake   1825 ml  Output   1805 ml  Net     20 ml    Exam:   General:  Pt is not in acute distress, sitting in chair   Cardiovascular: Rate controlled, appreciate S1, S2   Respiratory: Clear to auscultation bilaterally, diminished breath sounds at bases   Abdomen: VAC in place, NGT in place, tender in mid abdomen with voluntary guarding  Extremities: pulses palpable   Neuro: Nonfocal  Data Reviewed: Basic Metabolic Panel:  Recent Labs Lab 01/15/16 0142 01/15/16 1628 01/16/16 0352 01/17/16 0510 01/18/16 0355  NA 136 138 138 141 145  K 3.9 4.1 4.4 4.2 4.3  CL 97* 104 104 106 110  CO2 21* 24  GLUCOSE 292* 281* 220* 227* 200*  BUN 35* 34* 35* 33* 29*  CREATININE 2.40* 1.56* 1.57* 0.89 0.79  CALCIUM 8.8* 8.2* 8.4* 8.8* 9.1  MG  --   --   --   --  1.7   Liver Function Tests:  Recent Labs Lab 01/14/16 2029 01/15/16 0142  AST 19 16  ALT 13* 13*  ALKPHOS 61  61  BILITOT 1.0 1.1  PROT 6.8 6.5  ALBUMIN 3.0* 2.7*    Recent Labs Lab 01/14/16 2029  LIPASE 24   CBC:  Recent Labs Lab 01/14/16 2029 01/15/16 0142 01/15/16 1628 01/16/16 0352 01/17/16 0510 01/18/16 0355  WBC 9.1 7.0 12.3* 7.6 8.9 9.4  NEUTROABS 8.0* 6.0  --   --   --   --   HGB 10.3* 9.7* 9.5* 11.3* 8.7* 7.9*  HCT 33.2* 31.3* 30.6* 36.9 28.6* 26.1*  MCV 91.5 92.9 92.4 91.3 94.1 92.2  PLT 254 225 277 210 250 241   CBG:  Recent Labs Lab 01/17/16 2017 01/18/16 0036 01/18/16 0431 01/18/16 0801 01/18/16 1243  GLUCAP 191* 194* 170* 201* 183*    Recent Results (from the past 240  hour(s))  Culture, blood (routine x 2)     Status: None (Preliminary result)   Collection Time: 01/15/16  1:40 AM  Result Value Ref Range Status   Specimen Description BLOOD RIGHT HAND  Final   Special Requests BOTTLES DRAWN AEROBIC ONLY 5CC  Final   Culture   Final    NO GROWTH 3 DAYS Performed at [facility]    Report Status PENDING  Incomplete  Culture, blood (routine x 2)     Status: None (Preliminary result)   Collection Time: 01/15/16  1:49 AM  Result Value Ref Range Status   Specimen Description BLOOD LEFT HAND  Final   Special Requests BOTTLES DRAWN AEROBIC ONLY 5CC  Final   Culture   Final    NO GROWTH 3 DAYS Performed at [facility]    Report Status PENDING  Incomplete  Urine culture     Status: None   Collection Time: 01/15/16 10:21 AM  Result Value Ref Range Status   Specimen Description URINE, CLEAN CATCH  Final   Special Requests NONE  Final   Culture   Final    50,000 COLONIES/mL YEAST Performed at [facility]    Report Status 01/16/2016 FINAL  Final  Surgical pcr screen     Status: None   Collection Time: 01/15/16 10:21 AM  Result Value Ref Range Status   MRSA, PCR NEGATIVE NEGATIVE Final   Staphylococcus aureus NEGATIVE NEGATIVE Final    Scheduled Meds: . cefTRIAXone (ROCEPHIN)  IV  2 g Intravenous Q24H  . chlorhexidine gluconate  15 mL Mouth/Throat QID  . fluconazole (DIFLUCAN) IV  100 mg Intravenous Q24H  . fluticasone  2 spray Each Nare Daily  . heparin  5,000 Units Subcutaneous 3 times per day  . insulin aspart  0-15 Units Subcutaneous 6 times per day  . insulin glargine  10 Units Subcutaneous QHS  . pantoprazole (PROTONIX) IV  40 mg Intravenous QHS   Continuous Infusions: . sodium chloride 75 mL/hr at 01/17/16 [phone]

## 2016-01-18 NOTE — Progress Notes (Addendum)
3 Days Post-Op  Subjective: Pt with no complaints this AM No bowel function NGT in place  Objective: Vital signs in last 24 hours: Temp:  [97.5 F (36.4 C)-98.8 F (37.1 C)] 98.4 F (36.9 C) (01/21 0456) Pulse Rate:  [97-109] 99 (01/21 0500) Resp:  [13-25] 18 (01/21 0500) BP: (114-164)/(38-106) 128/53 mmHg (01/21 0400) SpO2:  [94 %-100 %] 97 % (01/21 0500) Weight:  [132 kg (291 lb 0.1 oz)] 132 kg (291 lb 0.1 oz) (01/21 0500) Last BM Date: 01/10/16  Intake/Output from previous day: 01/20 0701 - 01/21 0700 In: 1775 [I.V.:1725; IV Piggyback:50] Out: 1825 [Urine:1300; Emesis/NG output:350; Drains:175] Intake/Output this shift:    General appearance: alert and cooperative Cardio: tachy, RR GI: soft, non-tender; bowel sounds normal; no masses,  no organomegaly, VAC in place, incision c/d/i   Lab Results:   Recent Labs  01/17/16 0510 01/18/16 0355  WBC 8.9 9.4  HGB 8.7* 7.9*  HCT 28.6* 26.1*  PLT 250 241   BMET  Recent Labs  01/17/16 0510 01/18/16 0355  NA 141 145  K 4.2 4.3  CL 106 110  CO2 21* 24  GLUCOSE 227* 200*  BUN 33* 29*  CREATININE 0.89 0.79  CALCIUM 8.8* 9.1   PT/INR No results for input(s): LABPROT, INR in the last 72 hours. ABG No results for input(s): PHART, HCO3 in the last 72 hours.  Invalid input(s): PCO2, PO2  Studies/Results: No results found.  Anti-infectives: Anti-infectives    Start     Dose/Rate Route Frequency Ordered Stop   01/17/16 1700  cefTRIAXone (ROCEPHIN) 2 g in dextrose 5 % 50 mL IVPB    Comments:  Pharmacy may adjust dosing strength / duration / interval for maximal efficacy   2 g 100 mL/hr over 30 Minutes Intravenous Every 24 hours 01/17/16 1625 01/19/16 1659   01/17/16 1200  fluconazole (DIFLUCAN) IVPB 100 mg     100 mg 50 mL/hr over 60 Minutes Intravenous Every 24 hours 01/17/16 1019     01/17/16 0600  vancomycin (VANCOCIN) 1,750 mg in sodium chloride 0.9 % 500 mL IVPB  Status:  Discontinued     1,750 mg 250  mL/hr over 120 Minutes Intravenous Every 48 hours 01/15/16 0146 01/16/16 1126   01/16/16 1300  vancomycin (VANCOCIN) 1,500 mg in sodium chloride 0.9 % 500 mL IVPB  Status:  Discontinued     1,500 mg 250 mL/hr over 120 Minutes Intravenous Every 24 hours 01/16/16 1125 01/17/16 1208   01/16/16 0600  cefoTEtan (CEFOTAN) 2 g in dextrose 5 % 50 mL IVPB  Status:  Discontinued     2 g 100 mL/hr over 30 Minutes Intravenous On call to O.R. 01/15/16 1700 01/15/16 1709   01/15/16 1130  cefTRIAXone (ROCEPHIN) 1 g in dextrose 5 % 50 mL IVPB  Status:  Discontinued     1 g 100 mL/hr over 30 Minutes Intravenous Every 24 hours 01/15/16 1123 01/15/16 1124   01/15/16 0200  piperacillin-tazobactam (ZOSYN) IVPB 3.375 g  Status:  Discontinued     3.375 g 12.5 mL/hr over 240 Minutes Intravenous 3 times per day 01/15/16 0049 01/17/16 1625   01/15/16 0130  vancomycin (VANCOCIN) 2,500 mg in sodium chloride 0.9 % 500 mL IVPB     2,500 mg 250 mL/hr over 120 Minutes Intravenous  Once 01/15/16 0055 01/15/16 0342   01/14/16 2230  cefTRIAXone (ROCEPHIN) 1 g in dextrose 5 % 50 mL IVPB     1 g 100 mL/hr over 30 Minutes Intravenous  Once 01/14/16 2226 01/14/16 2326      Assessment/Plan: s/p Procedure(s): EXPLORATORY LAPAROTOMY, LYSIS OF ADHESIONS, EXTENSIVE DEBRIDEMENT OF SKIN AND SUBCUTANEOUS TISSUE AND MUSCLE, REPAIR OF INCARCERATED INCISION HERNIA WITH BIOLOGIC MESH, APPLICATION OF NEGATIVE PRESSURE DRESSING (N/A) Mobilize Con't NGT until bowel function Will check KUB in AM Pulm toilet   LOS: 4 days    Marigene Ehlers., [facility] 01/18/2016   Addendum: No need for abx from surgical standpoint.

## 2016-01-19 ENCOUNTER — Inpatient Hospital Stay (HOSPITAL_COMMUNITY): Payer: Medicare Other

## 2016-01-19 DIAGNOSIS — K460 Unspecified abdominal hernia with obstruction, without gangrene: Secondary | ICD-10-CM

## 2016-01-19 LAB — BASIC METABOLIC PANEL
ANION GAP: 13 (ref 5–15)
BUN: 25 mg/dL — ABNORMAL HIGH (ref 6–20)
CHLORIDE: 108 mmol/L (ref 101–111)
CO2: 21 mmol/L — AB (ref 22–32)
Calcium: 8.9 mg/dL (ref 8.9–10.3)
Creatinine, Ser: 0.73 mg/dL (ref 0.44–1.00)
GFR calc Af Amer: >60 mL/min (ref >60)
GFR calc non Af Amer: >60 mL/min (ref >60)
GLUCOSE: 233 mg/dL — AB (ref 65–99)
POTASSIUM: 4.4 mmol/L (ref 3.5–5.1)
Sodium: 142 mmol/L (ref 135–145)

## 2016-01-19 LAB — GLUCOSE, CAPILLARY
GLUCOSE-CAPILLARY: 226 mg/dL — AB (ref 65–99)
GLUCOSE-CAPILLARY: 222 mg/dL — AB (ref 65–99)
Glucose-Capillary: 200 mg/dL — ABNORMAL HIGH (ref 65–99)
Glucose-Capillary: 182 mg/dL — ABNORMAL HIGH (ref 65–99)
Glucose-Capillary: 177 mg/dL — ABNORMAL HIGH (ref 65–99)
Glucose-Capillary: 193 mg/dL — ABNORMAL HIGH (ref 65–99)
Glucose-Capillary: 196 mg/dL — ABNORMAL HIGH (ref 65–99)

## 2016-01-19 LAB — CBC
HCT: 26.5 % — ABNORMAL LOW (ref 36.0–46.0)
Hemoglobin: 8 g/dL — ABNORMAL LOW (ref 12.0–15.0)
MCH: 28.6 pg (ref 26.0–34.0)
MCHC: 30.2 g/dL (ref 30.0–36.0)
MCV: 94.6 fL (ref 78.0–100.0)
Platelets: 254 10*3/uL (ref 150–400)
RBC: 2.8 MIL/uL — ABNORMAL LOW (ref 3.87–5.11)
RDW: 14.6 % (ref 11.5–15.5)
WBC: 6.6 10*3/uL (ref 4.0–10.5)

## 2016-01-19 LAB — PROCALCITONIN: Procalcitonin: 0.14 ng/mL

## 2016-01-19 LAB — MAGNESIUM: Magnesium: 1.6 mg/dL — ABNORMAL LOW (ref 1.7–2.4)

## 2016-01-19 MED ORDER — MAGNESIUM SULFATE 2 GM/50ML IV SOLN
2.0000 g | Freq: Once | INTRAVENOUS | Status: AC
  Administered 2016-01-19: 2 g via INTRAVENOUS
  Filled 2016-01-19: qty 50

## 2016-01-19 NOTE — Progress Notes (Signed)
4 Days Post-Op  Subjective: Pt with no issues overnight. No BM /Flatus Sat in  Chair yesterday    Objective: Vital signs in last 24 hours: Temp:  [97.8 F (36.6 C)-99.1 F (37.3 C)] 97.8 F (36.6 C) (01/22 0451) Pulse Rate:  [96-112] 100 (01/22 0700) Resp:  [15-26] 26 (01/22 0700) BP: (102-181)/(49-86) 132/65 mmHg (01/22 0523) SpO2:  [92 %-100 %] 97 % (01/22 0700) Weight:  [132.8 kg (292 lb 12.3 oz)] 132.8 kg (292 lb 12.3 oz) (01/22 0500) Last BM Date: 01/10/16  Intake/Output from previous day: 01/21 0701 - 01/22 0700 In: 1345.8 [I.V.:1245.8; IV Piggyback:100] Out: 2380 [Urine:1580; Emesis/NG output:550; Drains:250] Intake/Output this shift:    General appearance: alert and cooperative GI: soft, non-tender; bowel sounds normal; no masses,  no organomegaly and vac in place  Lab Results:   Recent Labs  01/18/16 0355 01/19/16 0408  WBC 9.4 6.6  HGB 7.9* 8.0*  HCT 26.1* 26.5*  PLT 241 254   BMET  Recent Labs  01/18/16 0355 01/19/16 0408  NA 145 142  K 4.3 4.4  CL 110 108  CO2 24 21*  GLUCOSE 200* 233*  BUN 29* 25*  CREATININE 0.79 0.73  CALCIUM 9.1 8.9   PT/INR No results for input(s): LABPROT, INR in the last 72 hours. ABG No results for input(s): PHART, HCO3 in the last 72 hours.  Invalid input(s): PCO2, PO2  Studies/Results: No results found.  Anti-infectives: Anti-infectives    Start     Dose/Rate Route Frequency Ordered Stop   01/17/16 1700  cefTRIAXone (ROCEPHIN) 2 g in dextrose 5 % 50 mL IVPB    Comments:  Pharmacy may adjust dosing strength / duration / interval for maximal efficacy   2 g 100 mL/hr over 30 Minutes Intravenous Every 24 hours 01/17/16 1625 01/18/16 1703   01/17/16 1200  fluconazole (DIFLUCAN) IVPB 100 mg     100 mg 50 mL/hr over 60 Minutes Intravenous Every 24 hours 01/17/16 1019     01/17/16 0600  vancomycin (VANCOCIN) 1,750 mg in sodium chloride 0.9 % 500 mL IVPB  Status:  Discontinued     1,750 mg 250 mL/hr over 120  Minutes Intravenous Every 48 hours 01/15/16 0146 01/16/16 1126   01/16/16 1300  vancomycin (VANCOCIN) 1,500 mg in sodium chloride 0.9 % 500 mL IVPB  Status:  Discontinued     1,500 mg 250 mL/hr over 120 Minutes Intravenous Every 24 hours 01/16/16 1125 01/17/16 1208   01/16/16 0600  cefoTEtan (CEFOTAN) 2 g in dextrose 5 % 50 mL IVPB  Status:  Discontinued     2 g 100 mL/hr over 30 Minutes Intravenous On call to O.R. 01/15/16 1700 01/15/16 1709   01/15/16 1130  cefTRIAXone (ROCEPHIN) 1 g in dextrose 5 % 50 mL IVPB  Status:  Discontinued     1 g 100 mL/hr over 30 Minutes Intravenous Every 24 hours 01/15/16 1123 01/15/16 1124   01/15/16 0200  piperacillin-tazobactam (ZOSYN) IVPB 3.375 g  Status:  Discontinued     3.375 g 12.5 mL/hr over 240 Minutes Intravenous 3 times per day 01/15/16 0049 01/17/16 1625   01/15/16 0130  vancomycin (VANCOCIN) 2,500 mg in sodium chloride 0.9 % 500 mL IVPB     2,500 mg 250 mL/hr over 120 Minutes Intravenous  Once 01/15/16 0055 01/15/16 0342   01/14/16 2230  cefTRIAXone (ROCEPHIN) 1 g in dextrose 5 % 50 mL IVPB     1 g 100 mL/hr over 30 Minutes Intravenous  Once 01/14/16  2226 01/14/16 2326      Assessment/Plan: s/p Procedure(s): EXPLORATORY LAPAROTOMY, LYSIS OF ADHESIONS, EXTENSIVE DEBRIDEMENT OF SKIN AND SUBCUTANEOUS TISSUE AND MUSCLE, REPAIR OF INCARCERATED INCISION HERNIA WITH BIOLOGIC MESH, APPLICATION OF NEGATIVE PRESSURE DRESSING (N/A) Con't NGT Mobilize Await bowel function Vac change Monday   LOS: 5 days    Marigene Ehlers., [facility] 01/19/2016

## 2016-01-19 NOTE — Plan of Care (Signed)
Problem: Tissue Perfusion: Goal: Risk factors for ineffective tissue perfusion will decrease Outcome: Progressing Risk factors for VTE have been assessed.  Measures are being taken to prevent VTE.  Those measures include subcutaneous Heparin injections and bilateral SCDs.

## 2016-01-19 NOTE — Progress Notes (Addendum)
Attempted to get patient to stand up beside the bed.  Patient refused.  Education was provided to the patient about the importance of getting up out of bed and the risks with staying in bed; patient still refused. She states she is aware of the risks of not getting up, and she states she knows she needs to get up, and she will try in the morning, but she's scared.  Will continue to reinforce the importance of getting out of bed.  Will continue to monitor.

## 2016-01-19 NOTE — Progress Notes (Signed)
Patient ID: Valerie [name], female   DOB: 1946-10-05, 70 y.o.   MRN: [id number] [facility] PROGRESS NOTE  Valerie [name] WUJ:[id number] DOB: 1946/02/26 DOA: 01/14/2016 PCP: Garth Schlatter, MD  Brief narrative:    70 y.o. female with past medical history significant for overactive bladder, chronic UTIs, necrotizing pancreatitis status post partial pancreatectomy, morbid obesity, type 2 diabetes mellitus, just recently discharged after being hospitalized for small bowel obstruction. Patient presented to [facility] long hospital 01/14/2016 with worsening nausea, vomiting and abdominal discomfort for past day prior to this admission. CT abdomen and pelvis on the admission demonstrated small bowel obstruction with proximal ileum entrapment in the ventral hernia.  Patient is status post exploratory laparotomy, LOA, extensive debridement of skin and sq tissue and muscle, repair of incarcerated incisional hernia with biologic mesh, VAC application by Dr. Derrell Lolling 01/15/2016 for Incarcerated incisional hernias, multiple, with small bowel obstruction ischemic fat necrosis of skin and subcutaneous tissue and fascia. She has midline VAC placed and NG tube, managed by surgery.  Major events since admission: 1/18 - ex lap by Dr. Derrell Lolling, SDU post op 1/21 - improving slowly, NGT in place   Assessment/Plan:    Principal problem: Exploratory laparotomy / repair of incarcerated incisional hernia / Leukocytosis  - CT abdomen and pelvis on the admission demonstrated small bowel obstruction with proximal ileum entrapment in the ventral hernia - Status post exploratory laparotomy, LOA, extensive debridement of skin and sq tissue and muscle, repair of incarcerated incisional hernia with biologic mesh, VAC application by Dr. Derrell Lolling 01/15/2016 for incarcerated incisional hernias, multiple, with small bowel obstruction ischemic fat necrosis of skin and subcutaneous tissue and fascia.  - Management per surgery - She was  initially on broad spectrum abx, vanco and zosyn. Stopped 1/20 and transitioned to Rocephin IV 1/20 - Blood cultures so far negative - Continue VAC dressing changes MWF - Continue NG tube, keep NPO until bowel function  - Continue supportive care with analgesia and antiemetics as needed  - KUB in AM  Principal problem: Urinary tract infection, yeast  - Has had Klebsiella UTI during recent hospitalization. Urine culture on this admission growing yeast - continue Fluconzole day #3 (started 1/20)  Acute renal failure superimposed on chronic kidney disease stage 4 - Baseline creatinine is 1.34 and on this admission acutely elevated at 2.15 likely in the setting of acute illness - Creatinine remains WNL - BMP in AM  Hypomagnesemia  - supplement - repeat Mg level in AM  Anemia of chronic disease / Acute post operative blood loss anemia  - Due to combination of CKD and postoperative blood loss anemia  - Hemoglobin 8 today, hold off on transfusion for now and transfuse if Hg < 7 - Check CBC in am  Dyslipidemia associated with diabetes mellitus type 2 - Resume home meds Lipitor 10 mg at bedtime, omega 3 and fenofibrate when able to tolerate solids   Essential hypertension, benign - Hydralazine 10 mg IV every 4 hours as needed for BP above 150/90   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 - Has NG tube, NPO  DVT Prophylaxis  - Heparin subQ while pt in hospital   Code Status: Full.  Family Communication:  plan of care discussed with the patient and family at bedside  Disposition Plan: Remains in SDU, home once cleared by surgery    IV access:  Peripheral IV  Procedures and  diagnostic studies:    Exploratory laparotomy, LOA, extensive debridement of skin and sq tissue and muscle, repair of incarcerated incisional hernia with biologic mesh, VAC application---Dr. Derrell Lolling 01/15/2016 for Incarcerated incisional hernias, multiple, with small bowel obstruction Ischemic fat necrosis of skin and subcutaneous tissue and fascia.  Ct Abdomen Pelvis Wo Contrast 01/14/2016  1. Small bowel obstruction related to entrapment of proximal ileum in a small ventral hernia. Proximal small bowel is dilated with multiple air-fluid levels measuring up to 5.6 cm in diameter. Distal small bowel is completely decompressed, and colon is largely decompressed as well. Emergent surgical consultation is strongly recommended. 2. Extensive airspace disease in the lung bases bilaterally (left greater than right), concerning for potential aspiration pneumonia. 3. Cholelithiasis without evidence of acute cholecystitis at this time. 4. Chronic mild right hydronephrosis with urothelial thickening in the right renal collecting system, poorly evaluated on today's noncontrast CT examination, but similar in retrospect to prior studies. Nonemergent Urology consultation is recommended for further evaluation. 5. Atherosclerosis, including right coronary artery disease. 6. There are calcifications of the mitral valve and mitral annulus. Echocardiographic correlation for evaluation of potential valvular dysfunction may be warranted if clinically indicated. 7. Additional incidental findings, as above. Electronically Signed   By: Trudie Reed M.D.   On: 01/14/2016 22:13   Medical Consultants:  Surgery, Dr. Claud Kelp  Other Consultants:  DM coordinator PT  IAnti-Infectives:   Vanco ans zosyn 01/15/2016 --> 1/20 Rocephin 1/20 -->  Debbora Presto, MD [facility] Pager [phone]  If 7PM-7AM, please contact night-coverage [email or web address] Password [facility] 01/19/2016, 9:35 AM   LOS: 5 days    HPI/Subjective: No acute overnight events. Patient reports feeling tired, intermittent ab pain, 5/10 in severity.  Objective: Filed Vitals:   01/19/16 0523 01/19/16 0600 01/19/16 0700 01/19/16 0825  BP: 132/65   150/70  Pulse: 104 101 100 104  Temp:      TempSrc:      Resp: Height:      Weight:      SpO2: 97% 97% 97% 99%    Intake/Output Summary (Last 24 hours) at 01/19/16 0935 Last data filed at 01/19/16 0900  Gross per 24 hour  Intake 1375.83 ml  Output   2380 ml  Net -1004.17 ml    Exam:   General:  Pt is not in acute distress, sitting in chair   Cardiovascular: Rate controlled, appreciate S1, S2   Respiratory: Clear to auscultation bilaterally, diminished breath sounds at bases   Abdomen: VAC in place, NGT in place, tender in mid abdomen with voluntary guarding  Extremities: pulses palpable   Neuro: Nonfocal  Data Reviewed: Basic Metabolic Panel:  Recent Labs Lab 01/15/16 1628 01/16/16 0352 01/17/16 0510 01/18/16 0355 01/19/16 0408  NA 138 138 141 145 142  K 4.1 4.4 4.2 4.3 4.4  CL 104 104 106 110 108  CO2 22 23 21* 24 21*  GLUCOSE 281* 220* 227* 200* 233*  BUN 34* 35* 33* 29* 25*  CREATININE 1.56* 1.57* 0.89 0.79 0.73  CALCIUM 8.2* 8.4* 8.8* 9.1 8.9  MG  --   --   --  1.7 1.6*   Liver Function Tests:  Recent Labs Lab 01/14/16 2029 01/15/16 0142  AST 19 16  ALT 13* 13*  ALKPHOS 61  61  BILITOT 1.0 1.1  PROT 6.8 6.5  ALBUMIN 3.0* 2.7*    Recent Labs Lab 01/14/16 2029  LIPASE 24   CBC:  Recent Labs Lab 01/14/16 2029 01/15/16 0142 01/15/16 1628 01/16/16 0352 01/17/16 0510 01/18/16 0355 01/19/16 0408  WBC 9.1 7.0 12.3* 7.6 8.9 9.4 6.6  NEUTROABS 8.0* 6.0  --   --   --   --   --   HGB 10.3* 9.7* 9.5* 11.3* 8.7* 7.9* 8.0*  HCT 33.2* 31.3* 30.6* 36.9 28.6* 26.1* 26.5*  MCV 91.5 92.9 92.4 91.3 94.1 92.2 94.6  PLT 254 225 277 210 250 241 254   CBG:  Recent Labs Lab 01/18/16 1243 01/18/16 1604  01/18/16 2031 01/19/16 0017 01/19/16 0431  GLUCAP 183* 172* 169* 200* 226*    Recent Results (from the past 240 hour(s))  Culture, blood (routine x 2)     Status: None (Preliminary result)   Collection Time: 01/15/16  1:40 AM  Result Value Ref Range Status   Specimen Description BLOOD RIGHT HAND  Final   Special Requests BOTTLES DRAWN AEROBIC ONLY 5CC  Final   Culture   Final    NO GROWTH 3 DAYS Performed at [facility]    Report Status PENDING  Incomplete  Culture, blood (routine x 2)     Status: None (Preliminary result)   Collection Time: 01/15/16  1:49 AM  Result Value Ref Range Status   Specimen Description BLOOD LEFT HAND  Final   Special Requests BOTTLES DRAWN AEROBIC ONLY 5CC  Final   Culture   Final    NO GROWTH 3 DAYS Performed at [facility]    Report Status PENDING  Incomplete  Urine culture     Status: None   Collection Time: 01/15/16 10:21 AM  Result Value Ref Range Status   Specimen Description URINE, CLEAN CATCH  Final   Special Requests NONE  Final   Culture   Final    50,000 COLONIES/mL YEAST Performed at [facility]    Report Status 01/16/2016 FINAL  Final  Surgical pcr screen     Status: None   Collection Time: 01/15/16 10:21 AM  Result Value Ref Range Status   MRSA, PCR NEGATIVE NEGATIVE Final   Staphylococcus aureus NEGATIVE NEGATIVE Final    Scheduled Meds: . chlorhexidine gluconate  15 mL Mouth/Throat QID  . fluconazole (DIFLUCAN) IV  100 mg Intravenous Q24H  . fluticasone  2 spray Each Nare Daily  . heparin  5,000 Units Subcutaneous 3 times per day  . insulin aspart  0-20 Units Subcutaneous TID WC  . insulin glargine  20 Units Subcutaneous QHS  . pantoprazole (PROTONIX) IV  40 mg Intravenous QHS   Continuous Infusions: . sodium chloride 50 mL/hr at 01/19/16 0900

## 2016-01-20 ENCOUNTER — Ambulatory Visit: Payer: Medicare Other | Admitting: Endocrinology

## 2016-01-20 ENCOUNTER — Inpatient Hospital Stay (HOSPITAL_COMMUNITY): Payer: Medicare Other

## 2016-01-20 ENCOUNTER — Telehealth: Payer: Self-pay | Admitting: Endocrinology

## 2016-01-20 ENCOUNTER — Encounter: Payer: Self-pay | Admitting: *Deleted

## 2016-01-20 LAB — BASIC METABOLIC PANEL
ANION GAP: 13 (ref 5–15)
BUN: 21 mg/dL — ABNORMAL HIGH (ref 6–20)
CALCIUM: 8.6 mg/dL — AB (ref 8.9–10.3)
CO2: 21 mmol/L — AB (ref 22–32)
Chloride: 107 mmol/L (ref 101–111)
Creatinine, Ser: 0.63 mg/dL (ref 0.44–1.00)
Glucose, Bld: 242 mg/dL — ABNORMAL HIGH (ref 65–99)
Potassium: 4.4 mmol/L (ref 3.5–5.1)
SODIUM: 141 mmol/L (ref 135–145)

## 2016-01-20 LAB — CBC
HEMATOCRIT: 26.1 % — AB (ref 36.0–46.0)
Hemoglobin: 7.7 g/dL — ABNORMAL LOW (ref 12.0–15.0)
MCH: 27.1 pg (ref 26.0–34.0)
MCHC: 29.5 g/dL — ABNORMAL LOW (ref 30.0–36.0)
MCV: 91.9 fL (ref 78.0–100.0)
Platelets: 222 10*3/uL (ref 150–400)
RBC: 2.84 MIL/uL — ABNORMAL LOW (ref 3.87–5.11)
RDW: 14.5 % (ref 11.5–15.5)
WBC: 6.3 10*3/uL (ref 4.0–10.5)

## 2016-01-20 LAB — GLUCOSE, CAPILLARY
GLUCOSE-CAPILLARY: 228 mg/dL — AB (ref 65–99)
GLUCOSE-CAPILLARY: 211 mg/dL — AB (ref 65–99)
GLUCOSE-CAPILLARY: 204 mg/dL — AB (ref 65–99)
Glucose-Capillary: 199 mg/dL — ABNORMAL HIGH (ref 65–99)

## 2016-01-20 LAB — CULTURE, BLOOD (ROUTINE X 2)
Culture: NO GROWTH
Culture: NO GROWTH

## 2016-01-20 LAB — MAGNESIUM: MAGNESIUM: 1.7 mg/dL (ref 1.7–2.4)

## 2016-01-20 MED ORDER — CHLORHEXIDINE GLUCONATE 0.12 % MT SOLN
OROMUCOSAL | Status: AC
  Administered 2016-01-20: 15 mL via OROMUCOSAL
  Filled 2016-01-20: qty 15

## 2016-01-20 MED ORDER — FUROSEMIDE 10 MG/ML IJ SOLN
40.0000 mg | Freq: Once | INTRAMUSCULAR | Status: AC
  Administered 2016-01-20: 40 mg via INTRAVENOUS
  Filled 2016-01-20: qty 4

## 2016-01-20 MED ORDER — MAGNESIUM SULFATE 2 GM/50ML IV SOLN
2.0000 g | Freq: Once | INTRAVENOUS | Status: AC
  Administered 2016-01-20: 2 g via INTRAVENOUS
  Filled 2016-01-20: qty 50

## 2016-01-20 MED ORDER — METOPROLOL TARTRATE 1 MG/ML IV SOLN: 5.0000 mg | Freq: Four times a day (QID) | INTRAVENOUS | Status: DC | PRN

## 2016-01-20 NOTE — Progress Notes (Signed)
Inpatient Diabetes Program Recommendations  AACE/ADA: New Consensus Statement on Inpatient Glycemic Control (2015)  Target Ranges:  Prepandial:   less than 140 mg/dL      Peak postprandial:   less than 180 mg/dL (1-2 hours)      Critically ill patients:  140 - 180 mg/dL   Review of Glycemic Control  Results for Valerie [name], Valerie [name] (MRN [id number]) as of 01/20/2016 16:46  Ref. Range 01/19/2016 15:41 01/19/2016 20:32 01/19/2016 21:40 01/20/2016 09:04 01/20/2016 12:56  Glucose-Capillary Latest Ref Range: 65-99 mg/dL 409 (H) 811 (H) 914 (H) 228 (H) 211 (H)   Blood sugars > 180 mg/dL  Inpatient Diabetes Program Recommendations:   Consider increasing Lantus to 25 units QHS.  Will continue to follow. Thank you. Ailene Ards, RD, LDN, CDE Inpatient Diabetes Coordinator [phone]

## 2016-01-20 NOTE — Telephone Encounter (Signed)
Please reschedule at the earliest possible appointment

## 2016-01-20 NOTE — Telephone Encounter (Signed)
Letter mailed

## 2016-01-20 NOTE — Progress Notes (Signed)
Pt converted to afib.  Rate around 90-110.  Informed Dr. Izola Price.  See orders.  Erick Blinks, RN

## 2016-01-20 NOTE — Progress Notes (Addendum)
Physical Therapy Treatment Patient Details Name: Valerie [name] MRN: [id number] DOB: October 22, 1946 Today's Date: 01/20/2016    History of Present Illness patient is 70 yo female admitted 01/14/16 is status post exploratory laparotomy, extensive debridement of skin and sq tissue and muscle, repair of incarcerated incisional hernia with biologic mesh, VAC application by Dr. Derrell Lolling 01/15/2016 for Incarcerated incisional hernias, multiple, with small bowel obstruction ischemic fat necrosis of skin and subcutaneous tissue and fascia. She has midline VAC placed and NG tube, managed by surgery.    PT Comments    Pt  Progressing slowly,  She has  limited motivation to work with PT but with much encouragement she is willing to get up to chair;  Much work done this session on decr anxiety with regard to mobility, proper posture and controlled breathing--pt was able to bring her RR down to 19 by the end of PT session VS: HR  111-->128-->118         Sats 96%-->88-->92% on 3L         RR  28-->44-->19  Follow Up Recommendations  SNF;Supervision/Assistance - 24 hour     Equipment Recommendations  None recommended by PT    Recommendations for Other Services       Precautions / Restrictions Precautions Precautions: Fall Precaution Comments: NGT, VAC Restrictions Weight Bearing Restrictions: No    Mobility  Bed Mobility Overal bed mobility: Needs Assistance Bed Mobility: Rolling;Sidelying to Sit Rolling: Min assist;+2 for physical assistance;+2 for safety/equipment Sidelying to sit: HOB elevated;+2 for physical assistance;+2 for safety/equipment;Min assist       General bed mobility comments: increased time and use of B rails Assist for trunk to upright position  Transfers Overall transfer level: Needs assistance Equipment used: Rolling walker (2 wheeled) Transfers: Sit to/from [employer] Sit to Stand: Mod assist;+2 safety/equipment;+2 physical assistance Stand pivot  transfers: Mod assist;+2 physical assistance;+2 safety/equipment       General transfer comment: extra time , multimodal cues for reaching to armrest, for participation and safety; small pivotal steps to recliner;   Ambulation/Gait             General Gait Details: pt is unwilling to attempt amb   Stairs            Wheelchair Mobility    Modified Rankin (Stroke Patients Only)       Balance Overall balance assessment: Needs assistance   Sitting balance-Leahy Scale: Fair       Standing balance-Leahy Scale: Poor                      Cognition Arousal/Alertness: Awake/alert Behavior During Therapy: Anxious Overall Cognitive Status: Within Functional Limits for tasks assessed                      Exercises      General Comments        Pertinent Vitals/Pain Pain Assessment: 0-10 Pain Score: 8  Pain Location: abdomen Pain Descriptors / Indicators: Constant Pain Intervention(s): Limited activity within patient's tolerance;Monitored during session;Repositioned;Patient requesting pain meds-RN notified    Home Living                      Prior Function            PT Goals (current goals can now be found in the care plan section) Acute Rehab PT Goals Patient Stated Goal: feel better PT Goal Formulation: With patient/family Time For Goal Achievement:  02/01/16 Potential to Achieve Goals: Good Progress towards PT goals: Progressing toward goals    Frequency  Min 3X/week    PT Plan Current plan remains appropriate    Co-evaluation             End of Session Equipment Utilized During Treatment: Gait belt Activity Tolerance: Patient limited by pain Patient left: in chair;with call bell/phone within reach;with nursing/sitter in room     Time: 1136-1202 PT Time Calculation (min) (ACUTE ONLY): 26 min  Charges:  $Therapeutic Activity: 23-37 mins                    G Codes:      Shylin Keizer January 22, 2016, 12:57  PM

## 2016-01-20 NOTE — Progress Notes (Signed)
Patient ID: Valerie [name], female   DOB: 05/28/1946, 70 y.o.   MRN: [id number] [facility] PROGRESS NOTE  Tiffancy Moger WUJ:[id number] DOB: September 03, 1946 DOA: 01/14/2016 PCP: Garth Schlatter, MD  Brief narrative:    70 y.o. female with past medical history significant for overactive bladder, chronic UTIs, necrotizing pancreatitis status post partial pancreatectomy, morbid obesity, type 2 diabetes mellitus, just recently discharged after being hospitalized for small bowel obstruction. Patient presented to [facility] 01/14/2016 with worsening nausea, vomiting and abdominal discomfort for past day prior to this admission. CT abdomen and pelvis on the admission demonstrated small bowel obstruction with proximal ileum entrapment in the ventral hernia.  Patient is status post exploratory laparotomy, LOA, extensive debridement of skin and sq tissue and muscle, repair of incarcerated incisional hernia with biologic mesh, VAC application by Dr. Derrell Lolling 01/15/2016 for Incarcerated incisional hernias, multiple, with small bowel obstruction ischemic fat necrosis of skin and subcutaneous tissue and fascia. She has midline VAC placed and NG tube, managed by surgery.  Major events since admission: 1/18 - ex lap by Dr. Derrell Lolling, SDU post op 1/21 - improving slowly, NGT in place  1/23 - allowing sips   Assessment/Plan:    Principal problem: Exploratory laparotomy / repair of incarcerated incisional hernia / Leukocytosis  - CT abdomen and pelvis on the admission demonstrated small bowel obstruction with proximal ileum entrapment in the ventral hernia - Status post exploratory laparotomy, LOA, extensive debridement of skin and sq tissue and muscle, repair of incarcerated incisional hernia with biologic mesh, VAC application by Dr. Derrell Lolling 01/15/2016 for incarcerated incisional hernias, multiple, with small bowel obstruction ischemic fat necrosis of skin and subcutaneous tissue and fascia.  - post op day  #5 - Management per surgery - She was initially on broad spectrum abx, vanco and zosyn. Stopped 1/20 and transitioned to Rocephin IV 1/20 - Blood cultures so far negative - Continue VAC dressing changes MWF, appreciate wound care team assistance  - Continue NG tube, allowing sips per surgery team  - Continue supportive care with analgesia and antiemetics as needed   Principal problem: Urinary tract infection, yeast  - Has had Klebsiella UTI during recent hospitalization. Urine culture on this admission growing yeast - continue Fluconzole day #4/7 (started 1/20)  Acute renal failure superimposed on chronic kidney disease stage 4 - Baseline creatinine is 1.34 and on this admission acutely elevated at 2.15 likely in the setting of acute illness - Creatinine remains WNL - BMP in AM  Hypomagnesemia  - supplemented, still on low end of normal, will give one more dose of Mg today  - repeat Mg level in AM  Anemia of chronic disease / Acute post operative blood loss anemia  - Due to combination of CKD and postoperative blood loss anemia  - Hemoglobin 7.7 today, hold off on transfusion for now and transfuse if Hg < 7 - Check CBC in am  Dyslipidemia associated with diabetes mellitus type 2 - Resume home meds Lipitor 10 mg at bedtime, omega 3 and fenofibrate when able to tolerate solids   Essential hypertension, benign - Hydralazine 10 mg IV every 4 hours as needed for BP above 150/90  - for now, BP has been reasonably stable   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 - Has NG tube, allowing sips today   DVT Prophylaxis  - Heparin subQ while pt in hospital   Code Status: Full.  Family Communication:  plan of care discussed with the  patient and family at bedside  Disposition Plan: Remains in SDU, home once cleared by surgery    IV access:  Peripheral IV  Procedures and diagnostic studies:    Exploratory laparotomy, LOA, extensive debridement of skin and sq tissue and muscle, repair of incarcerated incisional hernia with biologic mesh, VAC application---Dr. Derrell Lolling 01/15/2016 for Incarcerated incisional hernias, multiple, with small bowel obstruction Ischemic fat necrosis of skin and subcutaneous tissue and fascia.  Ct Abdomen Pelvis Wo Contrast 01/14/2016  1. Small bowel obstruction related to entrapment of proximal ileum in a small ventral hernia. Proximal small bowel is dilated with multiple air-fluid levels measuring up to 5.6 cm in diameter. Distal small bowel is completely decompressed, and colon is largely decompressed as well. Emergent surgical consultation is strongly recommended. 2. Extensive airspace disease in the lung bases bilaterally (left greater than right), concerning for potential aspiration pneumonia. 3. Cholelithiasis without evidence of acute cholecystitis at this time. 4. Chronic mild right hydronephrosis with urothelial thickening in the right renal collecting system, poorly evaluated on today's noncontrast CT examination, but similar in retrospect to prior studies. Nonemergent Urology consultation is recommended for further evaluation. 5. Atherosclerosis, including right coronary artery disease. 6. There are calcifications of the mitral valve and mitral annulus. Echocardiographic correlation for evaluation of potential valvular dysfunction may be warranted if clinically indicated. 7. Additional incidental findings, as above. Electronically Signed   By: Trudie Reed M.D.   On: 01/14/2016 22:13   Medical Consultants:  Surgery, Dr. Claud Kelp  Other Consultants:  DM coordinator PT  IAnti-Infectives:   Vanco ans zosyn 01/15/2016 --> 1/20 Rocephin 1/20 -->  Debbora Presto, MD [facility] Pager [phone]  If 7PM-7AM, please contact night-coverage [email or web address] Password [facility] 01/20/2016, 10:53 AM   LOS: 6 days   HPI/Subjective: No acute overnight events. Patient reports feeling better, anxious about trying to get out of the bed.   Objective: Filed Vitals:   01/20/16 0500 01/20/16 0600 01/20/16 0618 01/20/16 0805  BP:  162/55 146/60 136/69  Pulse: 102 99 109 108  Temp:    97.7 F (36.5 C)  TempSrc:      Resp: Height:      Weight:      SpO2: 97% 96% 96% 96%    Intake/Output Summary (Last 24 hours) at 01/20/16 1053 Last data filed at 01/20/16 0900  Gross per 24 hour  Intake   1450 ml  Output   2330 ml  Net   -880 ml    Exam:   General:  Pt is not in acute distress, sitting in chair   Cardiovascular: Rate controlled, appreciate S1, S2   Respiratory: Clear to auscultation bilaterally, diminished breath sounds at bases   Abdomen: VAC in place, NGT in place, tender in mid abdomen with voluntary guarding  Extremities: pulses palpable   Neuro: Nonfocal  Data Reviewed: Basic Metabolic Panel:  Recent Labs Lab 01/16/16 0352 01/17/16 0510 01/18/16 0355 01/19/16 0408 01/20/16 0337  NA 138 141 145 142 141  K 4.4 4.2 4.3 4.4 4.4  CL 104 106 110 108 107  CO2 23 21* 24 21* 21*  GLUCOSE 220* 227* 200* 233* 242*  BUN 35* 33* 29* 25* 21*  CREATININE 1.57* 0.89 0.79 0.73 0.63  CALCIUM 8.4*  8.8* 9.1 8.9 8.6*  MG  --   --  1.7 1.6* 1.7   Liver Function Tests:  Recent Labs Lab 01/14/16 2029 01/15/16 0142  AST 19 16  ALT 13* 13*  ALKPHOS 61 61  BILITOT 1.0 1.1  PROT 6.8 6.5  ALBUMIN 3.0* 2.7*    Recent Labs Lab 01/14/16 2029  LIPASE 24   CBC:  Recent Labs Lab 01/14/16 2029 01/15/16 0142  01/16/16 0352 01/17/16 0510 01/18/16 0355 01/19/16 0408 01/20/16 0337  WBC 9.1 7.0  < > 7.6 8.9 9.4 6.6 6.3  NEUTROABS 8.0* 6.0  --   --   --   --   --   --   HGB 10.3* 9.7*  < > 11.3* 8.7* 7.9* 8.0* 7.7*  HCT 33.2*  31.3*  < > 36.9 28.6* 26.1* 26.5* 26.1*  MCV 91.5 92.9  < > 91.3 94.1 92.2 94.6 91.9  PLT 254 225  < > 210 250 241 254 222  < > = values in this interval not displayed. CBG:  Recent Labs Lab 01/19/16 1156 01/19/16 1541 01/19/16 2032 01/19/16 2140 01/20/16 0904  GLUCAP 182* 177* 193* 196* 228*    Recent Results (from the past 240 hour(s))  Culture, blood (routine x 2)     Status: None (Preliminary result)   Collection Time: 01/15/16  1:40 AM  Result Value Ref Range Status   Specimen Description BLOOD RIGHT HAND  Final   Special Requests BOTTLES DRAWN AEROBIC ONLY 5CC  Final   Culture   Final    NO GROWTH 3 DAYS Performed at [facility]    Report Status PENDING  Incomplete  Culture, blood (routine x 2)     Status: None (Preliminary result)   Collection Time: 01/15/16  1:49 AM  Result Value Ref Range Status   Specimen Description BLOOD LEFT HAND  Final   Special Requests BOTTLES DRAWN AEROBIC ONLY 5CC  Final   Culture   Final    NO GROWTH 3 DAYS Performed at [facility]    Report Status PENDING  Incomplete  Urine culture     Status: None   Collection Time: 01/15/16 10:21 AM  Result Value Ref Range Status   Specimen Description URINE, CLEAN CATCH  Final   Special Requests NONE  Final   Culture   Final    50,000 COLONIES/mL YEAST Performed at [facility]    Report Status 01/16/2016 FINAL  Final  Surgical pcr screen     Status: None   Collection Time: 01/15/16 10:21 AM  Result Value Ref Range Status   MRSA, PCR NEGATIVE NEGATIVE Final   Staphylococcus aureus NEGATIVE NEGATIVE Final    Scheduled Meds: . chlorhexidine gluconate  15 mL Mouth/Throat QID  . fluconazole (DIFLUCAN) IV  100 mg Intravenous Q24H  . fluticasone  2 spray Each Nare Daily  . heparin  5,000 Units Subcutaneous 3 times per day  . insulin aspart  0-20 Units Subcutaneous TID WC  . insulin glargine  20 Units Subcutaneous QHS  . pantoprazole IV  40 mg Intravenous QHS    Continuous Infusions: . sodium chloride 50 mL/hr at 01/19/16 2158

## 2016-01-20 NOTE — Progress Notes (Signed)
Date: January 20, 2016 Chart reviewed for concurrent status and case management needs. Will continue to follow patient for changes and needs: complexwound caare,-pt is over 300 lbs, npo with ng tube and persistent ileus Marcelle Smiling, BSN, CCM, RN,   [phone]

## 2016-01-20 NOTE — Consult Note (Signed)
WOC wound follow up Wound type: Open abdominal wound Measurement: per Friday Wound bed:red, moist with new area in center measuring 5cm x 8cm of sloughing granulation to reveal pale tissue. Undermining persists from 3-9 o'clock Drainage (amount, consistency, odor) moderate amount serosanguinous Periwound:intact Dressing procedure/placement/frequency: NPWT reapplied using 3 pieces of Mepitel and 4 pieces of black GranuFoam.  Drape applied and an immediate seal is achieved at 125 mmHg continuous negative pressure. Patient tolerated procedure well (had been premedicated).  Assisted in this bedside dressing change by Bedside RN, Onalee Hua. CCS MD had seen wound prior to dressing replacement. Tubing labeled with number and type of wound contact layers used today. WOC nursing team will follow along with you and will remain available to this patient, the nursing, surgical  and medical teams.  Supplies at bedside; next dressing change is scheduled for Wednesday, 01/22/16. Thanks, Ladona Mow, MSN, RN, GNP, [facility] [place]  Pager# [phone]

## 2016-01-20 NOTE — Telephone Encounter (Signed)
Patient no showed today's appt. Please advise on how to follow up. °A. No follow up necessary. °B. Follow up urgent. Contact patient immediately. °C. Follow up necessary. Contact patient and schedule visit in ___ days. °D. Follow up advised. Contact patient and schedule visit in ____weeks. ° °

## 2016-01-20 NOTE — Progress Notes (Signed)
Patient ID: Valerie [name], female   DOB: [birth year], 70 y.o.   MRN: [id number]  General Surgery [facility]  POD#: 5  Subjective: Patient in ICU, awake, responsive.  States passing flatus overnight.  No nausea, NG with bilious.  Objective: Vital signs in last 24 hours: Temp:  [98.7 F (37.1 C)-99.6 F (37.6 C)] 99.4 F (37.4 C) (01/23 0349) Pulse Rate:  [98-111] 109 (01/23 0618) Resp:  [20-34] 29 (01/23 0618) BP: (131-168)/(55-76) 146/60 mmHg (01/23 0618) SpO2:  [94 %-100 %] 96 % (01/23 0618) Weight:  [132.9 kg (292 lb 15.9 oz)] 132.9 kg (292 lb 15.9 oz) (01/23 0441) Last BM Date:  (prior to admission)  Intake/Output from previous day: 01/22 0701 - 01/23 0700 In: 1230 [I.V.:1150; NG/GT:30; IV Piggyback:50] Out: 2625 [Urine:2300; Emesis/NG output:100; Drains:225] Intake/Output this shift:    Physical Exam: HEENT - sclerae clear, mucous membranes moist Neck - soft Chest - clear bilaterally Cor - mild tachycardia Abdomen - obese; wound with VAC in midline, partial closure laterally on both sides; skin integrity good; VAC removed - clean wound with early granulation, no drainage, no necrosis, no visible bowel Neuro - alert & oriented, no focal deficits  Lab Results:   Recent Labs  01/19/16 0408 01/20/16 0337  WBC 6.6 6.3  HGB 8.0* 7.7*  HCT 26.5* 26.1*  PLT 254 222   BMET  Recent Labs  01/19/16 0408 01/20/16 0337  NA 142 141  K 4.4 4.4  CL 108 107  CO2 21* 21*  GLUCOSE 233* 242*  BUN 25* 21*  CREATININE 0.73 0.63  CALCIUM 8.9 8.6*   PT/INR No results for input(s): LABPROT, INR in the last 72 hours. Comprehensive Metabolic Panel:    Component Value Date/Time   NA 141 01/20/2016 0337   NA 142 01/19/2016 0408   K 4.4 01/20/2016 0337   K 4.4 01/19/2016 0408   CL 107 01/20/2016 0337   CL 108 01/19/2016 0408   CO2 21* 01/20/2016 0337   CO2 21* 01/19/2016 0408   BUN 21* 01/20/2016 0337   BUN 25* 01/19/2016 0408   CREATININE 0.63  01/20/2016 0337   CREATININE 0.73 01/19/2016 0408   CREATININE 1.27* 03/07/2014 0945   GLUCOSE 242* 01/20/2016 0337   GLUCOSE 233* 01/19/2016 0408   CALCIUM 8.6* 01/20/2016 0337   CALCIUM 8.9 01/19/2016 0408   AST 16 01/15/2016 0142   AST 19 01/14/2016 2029   ALT 13* 01/15/2016 0142   ALT 13* 01/14/2016 2029   ALKPHOS 61 01/15/2016 0142   ALKPHOS 61 01/14/2016 2029   BILITOT 1.1 01/15/2016 0142   BILITOT 1.0 01/14/2016 2029   PROT 6.5 01/15/2016 0142   PROT 6.8 01/14/2016 2029   ALBUMIN 2.7* 01/15/2016 0142   ALBUMIN 3.0* 01/14/2016 2029    Studies/Results: Dg Abd Portable 1v  01/19/2016  CLINICAL DATA:  Small bowel obstruction. EXAM: PORTABLE ABDOMEN - 1 VIEW COMPARISON:  CT of the abdomen pelvis 01/14/2016 FINDINGS: There is persistent small bowel obstruction with dilated small bowel loops measuring up to 4.3 cm in cross-section. Enteric catheter is likely transpyloric. There is no evidence of free intra-abdominal gas or intestinal pneumatosis on these supine radiographs. No radiographic evidence of organomegaly. Left anterior rib fractures are again seen. There is patchy left lung base airspace consolidation. IMPRESSION: Persistent small bowel obstruction with maximum diameter of small bowels of 4.3 cm. Electronically Signed   By: Ted Mcalpine M.D.   On: 01/19/2016 08:13    Anti-infectives: Anti-infectives  Start     Dose/Rate Route Frequency Ordered Stop   01/17/16 1700  cefTRIAXone (ROCEPHIN) 2 g in dextrose 5 % 50 mL IVPB    Comments:  Pharmacy may adjust dosing strength / duration / interval for maximal efficacy   2 g 100 mL/hr over 30 Minutes Intravenous Every 24 hours 01/17/16 1625 01/18/16 1703   01/17/16 1200  fluconazole (DIFLUCAN) IVPB 100 mg     100 mg 50 mL/hr over 60 Minutes Intravenous Every 24 hours 01/17/16 1019     01/17/16 0600  vancomycin (VANCOCIN) 1,750 mg in sodium chloride 0.9 % 500 mL IVPB  Status:  Discontinued     1,750 mg 250 mL/hr over 120  Minutes Intravenous Every 48 hours 01/15/16 0146 01/16/16 1126   01/16/16 1300  vancomycin (VANCOCIN) 1,500 mg in sodium chloride 0.9 % 500 mL IVPB  Status:  Discontinued     1,500 mg 250 mL/hr over 120 Minutes Intravenous Every 24 hours 01/16/16 1125 01/17/16 1208   01/16/16 0600  cefoTEtan (CEFOTAN) 2 g in dextrose 5 % 50 mL IVPB  Status:  Discontinued     2 g 100 mL/hr over 30 Minutes Intravenous On call to O.R. 01/15/16 1700 01/15/16 1709   01/15/16 1130  cefTRIAXone (ROCEPHIN) 1 g in dextrose 5 % 50 mL IVPB  Status:  Discontinued     1 g 100 mL/hr over 30 Minutes Intravenous Every 24 hours 01/15/16 1123 01/15/16 1124   01/15/16 0200  piperacillin-tazobactam (ZOSYN) IVPB 3.375 g  Status:  Discontinued     3.375 g 12.5 mL/hr over 240 Minutes Intravenous 3 times per day 01/15/16 0049 01/17/16 1625   01/15/16 0130  vancomycin (VANCOCIN) 2,500 mg in sodium chloride 0.9 % 500 mL IVPB     2,500 mg 250 mL/hr over 120 Minutes Intravenous  Once 01/15/16 0055 01/15/16 0342   01/14/16 2230  cefTRIAXone (ROCEPHIN) 1 g in dextrose 5 % 50 mL IVPB     1 g 100 mL/hr over 30 Minutes Intravenous  Once 01/14/16 2226 01/14/16 2326      Assessment & Plans: EXPLORATORY LAPAROTOMY, LYSIS OF ADHESIONS, EXTENSIVE DEBRIDEMENT OF SKIN AND SUBCUTANEOUS TISSUE AND MUSCLE, REPAIR OF INCARCERATED INCISION HERNIA WITH BIOLOGIC MESH, APPLICATION OF NEGATIVE PRESSURE DRESSING  Continue NG, IVF  Allow sips, chips  Mobilize - up to chair today  VAC dressing change today per WOC nurse - wet to dry dressing placed this AM  Velora Heckler, MD, [facility] Office: [phone]   Jenel Gierke Judie Petit 01/20/2016

## 2016-01-20 NOTE — Progress Notes (Signed)
Patient ID: Valerie [name], female   DOB: [birth year], 70 y.o.   MRN: [id number] [facility] PROGRESS NOTE  Valerie [name] [id number]:[id number] DOB: [birth year] DOA: 01/14/2016 PCP: Garth Schlatter, MD  Brief narrative:    70 y.o. female with past medical history significant for overactive bladder, chronic UTIs, necrotizing pancreatitis status post partial pancreatectomy, morbid obesity, type 2 diabetes mellitus, just recently discharged after being hospitalized for small bowel obstruction. Patient presented to [facility] long hospital 01/14/2016 with worsening nausea, vomiting and abdominal discomfort for past day prior to this admission. CT abdomen and pelvis on the admission demonstrated small bowel obstruction with proximal ileum entrapment in the ventral hernia.  Patient is status post exploratory laparotomy, LOA, extensive debridement of skin and sq tissue and muscle, repair of incarcerated incisional hernia with biologic mesh, VAC application by Dr. Derrell Lolling 01/15/2016 for Incarcerated incisional hernias, multiple, with small bowel obstruction ischemic fat necrosis of skin and subcutaneous tissue and fascia. She has midline VAC placed and NG tube, managed by surgery.  Major events since admission: 1/18 - ex lap by Dr. Derrell Lolling, SDU post op 1/21 - improving slowly, NGT in place  1/23 - allowing sips   Assessment/Plan:    Principal problem: Exploratory laparotomy / repair of incarcerated incisional hernia / Leukocytosis  - CT abdomen and pelvis on the admission demonstrated small bowel obstruction with proximal ileum entrapment in the ventral hernia - Status post exploratory laparotomy, LOA, extensive debridement of skin and sq tissue and muscle, repair of incarcerated incisional hernia with biologic mesh, VAC application by Dr. Derrell Lolling 01/15/2016 for incarcerated incisional hernias, multiple, with small bowel obstruction ischemic fat necrosis of skin and subcutaneous tissue and fascia.  - post op day  #5 - Management per surgery - She was initially on broad spectrum abx, vanco and zosyn. Stopped 1/20 and transitioned to Rocephin IV 1/20 - Blood cultures so far negative - Continue VAC dressing changes MWF, appreciate wound care team assistance  - Continue NG tube, allowing sips per surgery team  - Continue supportive care with analgesia and antiemetics as needed   Principal problem: Urinary tract infection, yeast  - Has had Klebsiella UTI during recent hospitalization. Urine culture on this admission growing yeast - continue Fluconzole day #4/7 (started 1/20)  Acute dyspnea - crackles on exam this AM - weight up from 278 --> 292 lbs this AM - will lower down fluid to maintenance fluids and will place on lasix - CXR requested   Acute renal failure superimposed on chronic kidney disease stage 4 - Baseline creatinine is 1.34 and on this admission acutely elevated at 2.15 likely in the setting of acute illness - Creatinine remains WNL - BMP in AM  Hypomagnesemia  - supplemented, still on low end of normal, will give one more dose of Mg today  - repeat Mg level in AM  Anemia of chronic disease / Acute post operative blood loss anemia  - Due to combination of CKD and postoperative blood loss anemia  - Hemoglobin 7.7 today, hold off on transfusion for now and transfuse if Hg < 7 - Check CBC in am  Dyslipidemia associated with diabetes mellitus type 2 - Resume home meds Lipitor 10 mg at bedtime, omega 3 and fenofibrate when able to tolerate solids   Essential hypertension, benign - Hydralazine 10 mg IV every 4 hours as needed for BP above 150/90  - for now, BP has been reasonably stable   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 -  Has NG tube, allowing sips today   DVT Prophylaxis  - Heparin subQ while pt in hospital   Code Status: Full.  Family Communication:  plan of care discussed with the patient and family at bedside  Disposition Plan: Remains in SDU, home once cleared by surgery    IV access:  Peripheral IV  Procedures and diagnostic studies:    Exploratory laparotomy, LOA, extensive debridement of skin and sq tissue and muscle, repair of incarcerated incisional hernia with biologic mesh, VAC application---Dr. Derrell Lolling 01/15/2016 for Incarcerated incisional hernias, multiple, with small bowel obstruction Ischemic fat necrosis of skin and subcutaneous tissue and fascia.  Ct Abdomen Pelvis Wo Contrast 01/14/2016  1. Small bowel obstruction related to entrapment of proximal ileum in a small ventral hernia. Proximal small bowel is dilated with multiple air-fluid levels measuring up to 5.6 cm in diameter. Distal small bowel is completely decompressed, and colon is largely decompressed as well. Emergent surgical consultation is strongly recommended. 2. Extensive airspace disease in the lung bases bilaterally (left greater than right), concerning for potential aspiration pneumonia. 3. Cholelithiasis without evidence of acute cholecystitis at this time. 4. Chronic mild right hydronephrosis with urothelial thickening in the right renal collecting system, poorly evaluated on today's noncontrast CT examination, but similar in retrospect to prior studies. Nonemergent Urology consultation is recommended for further evaluation. 5. Atherosclerosis, including right coronary artery disease. 6. There are calcifications of the mitral valve and mitral annulus. Echocardiographic correlation for evaluation of potential valvular dysfunction may be warranted if clinically indicated. 7. Additional incidental findings, as above. Electronically Signed   By: Trudie Reed M.D.   On: 01/14/2016 22:13   Medical Consultants:  Surgery, Dr. Claud Kelp  Other Consultants:  DM coordinator PT  IAnti-Infectives:   Vanco ans zosyn 01/15/2016 --> 1/20 Rocephin 1/20 -->  Debbora Presto, MD [facility] Pager [phone]  If 7PM-7AM, please contact night-coverage [email or web address] Password [email or web address] 01/20/2016, 11:01 AM   LOS: 6 days   HPI/Subjective: No acute overnight events. Patient reports feeling better, anxious about trying to get out of the bed.   Objective: Filed Vitals:   01/20/16 0500 01/20/16 0600 01/20/16 0618 01/20/16 0805  BP:  162/55 146/60 136/69  Pulse: 102 99 109 108  Temp:    97.7 F (36.5 C)  TempSrc:      Resp: Height:      Weight:      SpO2: 97% 96% 96% 96%    Intake/Output Summary (Last 24 hours) at 01/20/16 1101 Last data filed at 01/20/16 0900  Gross per 24 hour  Intake   1400 ml  Output   2330 ml  Net   -930 ml    Exam:   General:  Pt is not in acute distress, sitting in chair   Cardiovascular: Rate controlled, appreciate S1, S2   Respiratory: Crackles at bases   Abdomen: VAC in place, NGT in place, tender in mid abdomen with voluntary guarding  Extremities: pulses palpable, + 1 bilateral LE edema   Neuro: Nonfocal  Data Reviewed: Basic Metabolic Panel:  Recent Labs Lab 01/16/16 0352 01/17/16 0510 01/18/16 0355 01/19/16 0408 01/20/16 0337  NA 138 141 145 142 141  K 4.4 4.2 4.3 4.4 4.4  CL 104  106 110 108 107  CO2 23 21* 24 21* 21*  GLUCOSE 220* 227* 200* 233* 242*  BUN 35* 33* 29* 25* 21*  CREATININE 1.57* 0.89 0.79 0.73 0.63  CALCIUM 8.4* 8.8* 9.1 8.9 8.6*  MG  --   --  1.7 1.6* 1.7   Liver Function Tests:  Recent Labs Lab 01/14/16 2029 01/15/16 0142  AST 19 16  ALT 13* 13*  ALKPHOS 61 61  BILITOT 1.0 1.1  PROT 6.8 6.5  ALBUMIN 3.0* 2.7*    Recent Labs Lab 01/14/16 2029  LIPASE 24   CBC:  Recent Labs Lab 01/14/16 2029 01/15/16 0142  01/16/16 0352 01/17/16 0510 01/18/16 0355 01/19/16 0408 01/20/16 0337  WBC  9.1 7.0  < > 7.6 8.9 9.4 6.6 6.3  NEUTROABS 8.0* 6.0  --   --   --   --   --   --   HGB 10.3* 9.7*  < > 11.3* 8.7* 7.9* 8.0* 7.7*  HCT 33.2* 31.3*  < > 36.9 28.6* 26.1* 26.5* 26.1*  MCV 91.5 92.9  < > 91.3 94.1 92.2 94.6 91.9  PLT 254 225  < > 210 250 241 254 222  < > = values in this interval not displayed. CBG:  Recent Labs Lab 01/19/16 1156 01/19/16 1541 01/19/16 2032 01/19/16 2140 01/20/16 0904  GLUCAP 182* 177* 193* 196* 228*    Recent Results (from the past 240 hour(s))  Culture, blood (routine x 2)     Status: None (Preliminary result)   Collection Time: 01/15/16  1:40 AM  Result Value Ref Range Status   Specimen Description BLOOD RIGHT HAND  Final   Special Requests BOTTLES DRAWN AEROBIC ONLY 5CC  Final   Culture   Final    NO GROWTH 3 DAYS Performed at [facility]    Report Status PENDING  Incomplete  Culture, blood (routine x 2)     Status: None (Preliminary result)   Collection Time: 01/15/16  1:49 AM  Result Value Ref Range Status   Specimen Description BLOOD LEFT HAND  Final   Special Requests BOTTLES DRAWN AEROBIC ONLY 5CC  Final   Culture   Final    NO GROWTH 3 DAYS Performed at [facility]    Report Status PENDING  Incomplete  Urine culture     Status: None   Collection Time: 01/15/16 10:21 AM  Result Value Ref Range Status   Specimen Description URINE, CLEAN CATCH  Final   Special Requests NONE  Final   Culture   Final    50,000 COLONIES/mL YEAST Performed at [facility]    Report Status 01/16/2016 FINAL  Final  Surgical pcr screen     Status: None   Collection Time: 01/15/16 10:21 AM  Result Value Ref Range Status   MRSA, PCR NEGATIVE NEGATIVE Final   Staphylococcus aureus NEGATIVE NEGATIVE Final    Scheduled Meds: . chlorhexidine gluconate  15 mL Mouth/Throat QID  . fluconazole (DIFLUCAN) IV  100 mg Intravenous Q24H  . fluticasone  2 spray Each Nare Daily  . heparin  5,000 Units Subcutaneous 3 times per day  .  insulin aspart  0-20 Units Subcutaneous TID WC  . insulin glargine  20 Units Subcutaneous QHS  . pantoprazole IV  40 mg Intravenous QHS   Continuous Infusions: . sodium chloride 50 mL/hr at 01/19/16 2158

## 2016-01-21 ENCOUNTER — Inpatient Hospital Stay (HOSPITAL_COMMUNITY): Payer: Medicare Other

## 2016-01-21 DIAGNOSIS — R0600 Dyspnea, unspecified: Secondary | ICD-10-CM

## 2016-01-21 LAB — BASIC METABOLIC PANEL
ANION GAP: 11 (ref 5–15)
BUN: 17 mg/dL (ref 6–20)
CO2: 27 mmol/L (ref 22–32)
Calcium: 8.8 mg/dL — ABNORMAL LOW (ref 8.9–10.3)
Chloride: 106 mmol/L (ref 101–111)
Creatinine, Ser: 0.59 mg/dL (ref 0.44–1.00)
GFR calc non Af Amer: >60 mL/min (ref >60)
GLUCOSE: 247 mg/dL — AB (ref 65–99)
POTASSIUM: 4 mmol/L (ref 3.5–5.1)
Sodium: 144 mmol/L (ref 135–145)

## 2016-01-21 LAB — CBC
HEMATOCRIT: 27.1 % — AB (ref 36.0–46.0)
HEMOGLOBIN: 8.1 g/dL — AB (ref 12.0–15.0)
MCH: 27.9 pg (ref 26.0–34.0)
MCHC: 29.9 g/dL — ABNORMAL LOW (ref 30.0–36.0)
MCV: 93.4 fL (ref 78.0–100.0)
Platelets: 258 10*3/uL (ref 150–400)
RBC: 2.9 MIL/uL — AB (ref 3.87–5.11)
RDW: 14.5 % (ref 11.5–15.5)
WBC: 4.9 10*3/uL (ref 4.0–10.5)

## 2016-01-21 LAB — MAGNESIUM: Magnesium: 1.7 mg/dL (ref 1.7–2.4)

## 2016-01-21 LAB — GLUCOSE, CAPILLARY
GLUCOSE-CAPILLARY: 240 mg/dL — AB (ref 65–99)
Glucose-Capillary: 245 mg/dL — ABNORMAL HIGH (ref 65–99)
Glucose-Capillary: 202 mg/dL — ABNORMAL HIGH (ref 65–99)
Glucose-Capillary: 171 mg/dL — ABNORMAL HIGH (ref 65–99)

## 2016-01-21 MED ORDER — MAGNESIUM SULFATE 2 GM/50ML IV SOLN
2.0000 g | Freq: Once | INTRAVENOUS | Status: AC
  Administered 2016-01-21: 2 g via INTRAVENOUS
  Filled 2016-01-21: qty 50

## 2016-01-21 MED ORDER — CHLORHEXIDINE GLUCONATE 0.12 % MT SOLN
15.0000 mL | Freq: Two times a day (BID) | OROMUCOSAL | Status: DC
  Administered 2016-01-22 – 2016-01-30 (×14): 15 mL via OROMUCOSAL
  Filled 2016-01-21 (×17): qty 15

## 2016-01-21 MED ORDER — CETYLPYRIDINIUM CHLORIDE 0.05 % MT LIQD
7.0000 mL | Freq: Two times a day (BID) | OROMUCOSAL | Status: DC
  Administered 2016-01-22 – 2016-01-28 (×10): 7 mL via OROMUCOSAL

## 2016-01-21 NOTE — Progress Notes (Signed)
Pt was taking breathing treatment when I arrived, but wanted to know purpose of my visit. Upon introduction, and offer to return for a visit after her treatment, pt said she was fine and would not need a visit later. Pt was thankful for [facility] stopping by. Chaplain Marjory Lies, M.Div.   01/20/16 1912  Clinical Encounter Type  Visited With Patient

## 2016-01-21 NOTE — Progress Notes (Signed)
Patient was dangled at bedside.  After dangling, she was successful at ambulating approximately 10 feet in her room with 2 person assist and a front wheeled walker.  She tolerated the ambulation well.  She was assisted back into bed, and is resting now.    Will continue to monitor and provide encouragement for ambulation.

## 2016-01-21 NOTE — Clinical Documentation Improvement (Addendum)
Hospitalists  (Query responses must be documented in the current medical record, not on the CDI BPA form.)  "Afib" is documented in a nursing progress note 01/20/16 but is not documented by the medical attending.  Please clarify if the patient has actually had "Afib" this admission, including the type ie: paroxsymal, chronic and also including any treatment and/or associated cause(s) or condition(s).  Clinical Information: "Pt converted to afib. Rate around 90-110. Informed Dr. Izola Price. See orders" documented by Erick Blinks, RN 01/20/16 at 2:30 pm   Order in [facility] for Metoprolol.  However, cannot locate that any doses have been given since it was ordered.   metoprolol (LOPRESSOR) injection 5 mg  [[id number]]     Ordered Dose: 5 mg Route: Intravenous Frequency: Every 6 hours PRN for for HR > 115    Administration Dose: 5 mg       Start: 01/20/16 1342 End: --          Madlyn Frankel Instructions:   (BETA BLOCKER)                    Order Status: Active    Ordering User: Dorothea Ogle, MD Ordering Date/Time: Mon Jan 20, 2016 1342    Ordering Provider: Dorothea Ogle, MD Authorizing Provider: Dorothea Ogle, MD       Please exercise your independent, professional judgment when responding. A specific answer is not anticipated or expected.   Thank You, Jerral Ralph  RN BSN [facility]S [phone] [facility] Information Management [place]

## 2016-01-21 NOTE — Progress Notes (Addendum)
Patient ID: Valerie [name], female   DOB: 12-05-1946, 70 y.o.   MRN: [id number] [facility] PROGRESS NOTE  Brenya Taulbee WUJ:[id number] DOB: 01-21-1946 DOA: 01/14/2016 PCP: Garth Schlatter, MD  Brief narrative:    70 y.o. female with past medical history significant for overactive bladder, chronic UTIs, necrotizing pancreatitis status post partial pancreatectomy, morbid obesity, type 2 diabetes mellitus, just recently discharged after being hospitalized for small bowel obstruction. Patient presented to [facility] hospital 01/14/2016 with worsening nausea, vomiting and abdominal discomfort for past day prior to this admission. CT abdomen and pelvis on the admission demonstrated small bowel obstruction with proximal ileum entrapment in the ventral hernia.  Patient is status post exploratory laparotomy, LOA, extensive debridement of skin and sq tissue and muscle, repair of incarcerated incisional hernia with biologic mesh, VAC application by Dr. Derrell Lolling 01/15/2016 for Incarcerated incisional hernias, multiple, with small bowel obstruction ischemic fat necrosis of skin and subcutaneous tissue and fascia. She has midline VAC placed and NG tube, managed by surgery.  Major events since admission: 1/18 - ex lap by Dr. Derrell Lolling, SDU post op 1/21 - improving slowly, NGT in place  1/23 - allowing sips   Assessment/Plan:    Principal problem: Exploratory laparotomy / repair of incarcerated incisional hernia / Leukocytosis  - CT abdomen and pelvis on the admission demonstrated small bowel obstruction with proximal ileum entrapment in the ventral hernia - Status post exploratory laparotomy, LOA, extensive debridement of skin and sq tissue and muscle, repair of incarcerated incisional hernia with biologic mesh, VAC application by Dr. Derrell Lolling 01/15/2016 for incarcerated incisional hernias, multiple, with small bowel obstruction ischemic fat necrosis of skin and subcutaneous tissue and fascia.  - post op day  #6 - Management per surgery - She was initially on broad spectrum abx, vanco and zosyn. Stopped 1/20 and transitioned to Rocephin IV 1/20 - Blood cultures so far negative - Continue VAC dressing changes MWF, appreciate wound care team assistance  - remove NGT, advance to clears today, mobilize  - Continue supportive care with analgesia and antiemetics as needed   Principal problem: Urinary tract infection, yeast  - Has had Klebsiella UTI during recent hospitalization. Urine culture on this admission growing yeast - continue Fluconzole day #5/7 (started 1/20)  Acute dyspnea - crackles on exam this AM - weight up from 278 --> 292 --> 286 lbs after one dose of Lasix 40 mg IV given on 1/23, no need for additional lasix - monitor volume status closely  - CXR with ? PNA, CT chest for clearer evaluation requested   Acute renal failure superimposed on chronic kidney disease stage 4 - Baseline creatinine is 1.34 and on this admission acutely elevated at 2.15 likely in the setting of acute illness - Creatinine remains WNL - BMP in AM  Hypomagnesemia  - supplemented, still on low end of normal, give additional 2 gm Mg IV - repeat Mg level in AM  Anemia of chronic disease / Acute post operative blood loss anemia  - Due to combination of CKD and postoperative blood loss anemia  - Hemoglobin 8.1 today, hold off on transfusion for now and transfuse if Hg < 7 - Check CBC in am  Dyslipidemia associated with diabetes mellitus type 2 - Resume home meds Lipitor 10 mg at bedtime, omega 3 and fenofibrate when able to tolerate solids   Essential hypertension, benign - Hydralazine 10 mg IV every 4 hours as needed for BP above 150/90  - for now, BP has been reasonably  stable   Controlled type 2 diabetes mellitus with diabetic nephropathy with long-term insulin use (HCC) - A1c is 6.4, indicating good glycemic control  - Continue Lantus 20 units at bedtime - Continue SSI, resistant scale  Morbid  obesity due to excess calorie - Body mass index is 47.81 kg/(m^2). - Has NG tube, allowing sips today   DVT Prophylaxis  - Heparin subQ while pt in hospital   Code Status: Full.  Family Communication:  plan of care discussed with the patient and family at bedside  Disposition Plan: Remains in SDU, home once cleared by surgery    IV access:  Peripheral IV  Procedures and diagnostic studies:    Exploratory laparotomy, LOA, extensive debridement of skin and sq tissue and muscle, repair of incarcerated incisional hernia with biologic mesh, VAC application---Dr. Derrell Lolling 01/15/2016 for Incarcerated incisional hernias, multiple, with small bowel obstruction Ischemic fat necrosis of skin and subcutaneous tissue and fascia.  Ct Abdomen Pelvis Wo Contrast 01/14/2016  1. Small bowel obstruction related to entrapment of proximal ileum in a small ventral hernia. Proximal small bowel is dilated with multiple air-fluid levels measuring up to 5.6 cm in diameter. Distal small bowel is completely decompressed, and colon is largely decompressed as well. Emergent surgical consultation is strongly recommended. 2. Extensive airspace disease in the lung bases bilaterally (left greater than right), concerning for potential aspiration pneumonia. 3. Cholelithiasis without evidence of acute cholecystitis at this time. 4. Chronic mild right hydronephrosis with urothelial thickening in the right renal collecting system, poorly evaluated on today's noncontrast CT examination, but similar in retrospect to prior studies. Nonemergent Urology consultation is recommended for further evaluation. 5. Atherosclerosis, including right coronary artery disease. 6. There are calcifications of the mitral valve and mitral annulus. Echocardiographic correlation for evaluation of potential valvular dysfunction may be warranted if clinically indicated. 7. Additional incidental findings, as above. Electronically Signed   By: Trudie Reed M.D.    On: 01/14/2016 22:13   Medical Consultants:  Surgery, Dr. Claud Kelp  Other Consultants:  DM coordinator PT  IAnti-Infectives:   Vanco ans zosyn 01/15/2016 --> 1/20 Rocephin 1/20 -->  Debbora Presto, MD [facility] Pager [phone]  If 7PM-7AM, please contact night-coverage [email or web address] Password [facility] 01/21/2016, 2:47 PM   LOS: 7 days   HPI/Subjective: No acute overnight events. Patient reports feeling better, anxious about trying to get out of the bed.   Objective: Filed Vitals:   01/21/16 0837 01/21/16 1000 01/21/16 1012 01/21/16 1200  BP:  130/70  150/57  Pulse: 98 94 90 99  Temp:    98.8 F (37.1 C)  TempSrc:    Oral  Resp: Height:      Weight:      SpO2: 96% 98% 98% 96%    Intake/Output Summary (Last 24 hours) at 01/21/16 1447 Last data filed at 01/21/16 1000  Gross per 24 hour  Intake    350 ml  Output   1910 ml  Net  -1560 ml    Exam:   General:  Pt is not in acute distress, sitting in chair   Cardiovascular: Rate controlled, appreciate S1, S2   Respiratory: Crackles at bases   Abdomen: VAC in place, NGT in place, tender in mid abdomen with voluntary guarding  Extremities: pulses palpable, + 1 bilateral LE edema   Neuro: Nonfocal  Data Reviewed: Basic Metabolic Panel:  Recent Labs Lab 01/17/16 0510 01/18/16 0355 01/19/16 0408 01/20/16 0337 01/21/16 0340  NA 141 145  142 141 144  K 4.2 4.3 4.4 4.4 4.0  CL 106 110 108 107 106  CO2 21* 24 21* 21* 27  GLUCOSE 227* 200* 233* 242* 247*  BUN 33* 29* 25* 21* 17  CREATININE 0.89 0.79 0.73 0.63 0.59  CALCIUM 8.8* 9.1 8.9 8.6* 8.8*  MG  --  1.7 1.6* 1.7 1.7   Liver Function Tests:  Recent Labs Lab 01/14/16 2029 01/15/16 0142  AST 19 16  ALT 13* 13*  ALKPHOS 61 61  BILITOT 1.0 1.1  PROT 6.8 6.5  ALBUMIN 3.0* 2.7*    Recent Labs Lab 01/14/16 2029  LIPASE 24   CBC:  Recent Labs Lab 01/14/16 2029 01/15/16 0142  01/17/16 0510  01/18/16 0355 01/19/16 0408 01/20/16 0337 01/21/16 0340  WBC 9.1 7.0  < > 8.9 9.4 6.6 6.3 4.9  NEUTROABS 8.0* 6.0  --   --   --   --   --   --   HGB 10.3* 9.7*  < > 8.7* 7.9* 8.0* 7.7* 8.1*  HCT 33.2* 31.3*  < > 28.6* 26.1* 26.5* 26.1* 27.1*  MCV 91.5 92.9  < > 94.1 92.2 94.6 91.9 93.4  PLT 254 225  < > 250 241 254 222 258  < > = values in this interval not displayed. CBG:  Recent Labs Lab 01/20/16 1256 01/20/16 1706 01/20/16 2113 01/21/16 0811 01/21/16 1224  GLUCAP 211* 199* 204* 240* 245*    Recent Results (from the past 240 hour(s))  Culture, blood (routine x 2)     Status: None (Preliminary result)   Collection Time: 01/15/16  1:40 AM  Result Value Ref Range Status   Specimen Description BLOOD RIGHT HAND  Final   Special Requests BOTTLES DRAWN AEROBIC ONLY 5CC  Final   Culture   Final    NO GROWTH 3 DAYS Performed at [facility]    Report Status PENDING  Incomplete  Culture, blood (routine x 2)     Status: None (Preliminary result)   Collection Time: 01/15/16  1:49 AM  Result Value Ref Range Status   Specimen Description BLOOD LEFT HAND  Final   Special Requests BOTTLES DRAWN AEROBIC ONLY 5CC  Final   Culture   Final    NO GROWTH 3 DAYS Performed at [facility]    Report Status PENDING  Incomplete  Urine culture     Status: None   Collection Time: 01/15/16 10:21 AM  Result Value Ref Range Status   Specimen Description URINE, CLEAN CATCH  Final   Special Requests NONE  Final   Culture   Final    50,000 COLONIES/mL YEAST Performed at [facility]    Report Status 01/16/2016 FINAL  Final  Surgical pcr screen     Status: None   Collection Time: 01/15/16 10:21 AM  Result Value Ref Range Status   MRSA, PCR NEGATIVE NEGATIVE Final   Staphylococcus aureus NEGATIVE NEGATIVE Final    Scheduled Meds: . chlorhexidine gluconate  15 mL Mouth/Throat QID  . fluconazole (DIFLUCAN) IV  100 mg Intravenous Q24H  . fluticasone  2 spray Each Nare  Daily  . heparin  5,000 Units Subcutaneous 3 times per day  . insulin aspart  0-20 Units Subcutaneous TID WC  . insulin glargine  20 Units Subcutaneous QHS  . pantoprazole IV  40 mg Intravenous QHS   Continuous Infusions: . sodium chloride 10 mL/hr (01/21/16 1003)

## 2016-01-21 NOTE — Progress Notes (Signed)
Patient ID: Valerie [name], female   DOB: 17-Nov-1946, 70 y.o.   MRN: [id number]  General Surgery [facility]  POD#: 6  Subjective: Patient in bed, ambulated in room early this AM.  Passing flatus, on bed pan now.  Denies nausea or emesis.  Objective: Vital signs in last 24 hours: Temp:  [97.6 F (36.4 C)-98.7 F (37.1 C)] 98.7 F (37.1 C) (01/24 0342) Pulse Rate:  [84-103] 103 (01/24 0600) Resp:  [15-34] 19 (01/24 0600) BP: (101-153)/(44-88) 144/44 mmHg (01/24 0200) SpO2:  [95 %-100 %] 98 % (01/24 0600) Weight:  [130.1 kg (286 lb 13.1 oz)] 130.1 kg (286 lb 13.1 oz) (01/24 0500) Last BM Date:  (prior to admission)  Intake/Output from previous day: 01/23 0701 - 01/24 0700 In: 484 [I.V.:434; IV Piggyback:50] Out: 2215 [Urine:1775; Emesis/NG output:280; Drains:160] Intake/Output this shift:    Physical Exam: HEENT - sclerae clear, mucous membranes moist Neck - soft Chest - clear bilaterally Cor - RRR Abdomen - soft, obese; VAC dressing intact; active BS present Neuro - alert & oriented, no focal deficits  Lab Results:   Recent Labs  01/20/16 0337 01/21/16 0340  WBC 6.3 4.9  HGB 7.7* 8.1*  HCT 26.1* 27.1*  PLT 222 258   BMET  Recent Labs  01/20/16 0337 01/21/16 0340  NA 141 144  K 4.4 4.0  CL 107 106  CO2 21* 27  GLUCOSE 242* 247*  BUN 21* 17  CREATININE 0.63 0.59  CALCIUM 8.6* 8.8*   PT/INR No results for input(s): LABPROT, INR in the last 72 hours. Comprehensive Metabolic Panel:    Component Value Date/Time   NA 144 01/21/2016 0340   NA 141 01/20/2016 0337   K 4.0 01/21/2016 0340   K 4.4 01/20/2016 0337   CL 106 01/21/2016 0340   CL 107 01/20/2016 0337   CO2 27 01/21/2016 0340   CO2 21* 01/20/2016 0337   BUN 17 01/21/2016 0340   BUN 21* 01/20/2016 0337   CREATININE 0.59 01/21/2016 0340   CREATININE 0.63 01/20/2016 0337   CREATININE 1.27* 03/07/2014 0945   GLUCOSE 247* 01/21/2016 0340   GLUCOSE 242* 01/20/2016 0337   CALCIUM 8.8* 01/21/2016 0340   CALCIUM 8.6* 01/20/2016 0337   AST 16 01/15/2016 0142   AST 19 01/14/2016 2029   ALT 13* 01/15/2016 0142   ALT 13* 01/14/2016 2029   ALKPHOS 61 01/15/2016 0142   ALKPHOS 61 01/14/2016 2029   BILITOT 1.1 01/15/2016 0142   BILITOT 1.0 01/14/2016 2029   PROT 6.5 01/15/2016 0142   PROT 6.8 01/14/2016 2029   ALBUMIN 2.7* 01/15/2016 0142   ALBUMIN 3.0* 01/14/2016 2029    Studies/Results: Dg Chest 1 View  01/20/2016  CLINICAL DATA:  Shortness of breath for 2 days.  Former smoker. EXAM: CHEST 1 VIEW COMPARISON:  01/06/2016 FINDINGS: There is a 2.7 cm nodular opacity in the right midlung. There is bilateral interstitial thickening. There is no pleural effusion or pneumothorax. There is stable cardiomegaly. There is a nasogastric tube coursing below the diaphragm. The osseous structures are unremarkable. IMPRESSION: 1. Cardiomegaly with mild pulmonary vascular congestion. 2. There is a 2.7 cm nodular opacity in the right midlung most concerning for pneumonia. Electronically Signed   By: Elige Ko   On: 01/20/2016 13:47    Anti-infectives: Anti-infectives    Start     Dose/Rate Route Frequency Ordered Stop   01/17/16 1700  cefTRIAXone (ROCEPHIN) 2 g in dextrose 5 % 50 mL IVPB  Comments:  Pharmacy may adjust dosing strength / duration / interval for maximal efficacy   2 g 100 mL/hr over 30 Minutes Intravenous Every 24 hours 01/17/16 1625 01/18/16 1703   01/17/16 1200  fluconazole (DIFLUCAN) IVPB 100 mg     100 mg 50 mL/hr over 60 Minutes Intravenous Every 24 hours 01/17/16 1019     01/17/16 0600  vancomycin (VANCOCIN) 1,750 mg in sodium chloride 0.9 % 500 mL IVPB  Status:  Discontinued     1,750 mg 250 mL/hr over 120 Minutes Intravenous Every 48 hours 01/15/16 0146 01/16/16 1126   01/16/16 1300  vancomycin (VANCOCIN) 1,500 mg in sodium chloride 0.9 % 500 mL IVPB  Status:  Discontinued     1,500 mg 250 mL/hr over 120 Minutes Intravenous Every 24 hours  01/16/16 1125 01/17/16 1208   01/16/16 0600  cefoTEtan (CEFOTAN) 2 g in dextrose 5 % 50 mL IVPB  Status:  Discontinued     2 g 100 mL/hr over 30 Minutes Intravenous On call to O.R. 01/15/16 1700 01/15/16 1709   01/15/16 1130  cefTRIAXone (ROCEPHIN) 1 g in dextrose 5 % 50 mL IVPB  Status:  Discontinued     1 g 100 mL/hr over 30 Minutes Intravenous Every 24 hours 01/15/16 1123 01/15/16 1124   01/15/16 0200  piperacillin-tazobactam (ZOSYN) IVPB 3.375 g  Status:  Discontinued     3.375 g 12.5 mL/hr over 240 Minutes Intravenous 3 times per day 01/15/16 0049 01/17/16 1625   01/15/16 0130  vancomycin (VANCOCIN) 2,500 mg in sodium chloride 0.9 % 500 mL IVPB     2,500 mg 250 mL/hr over 120 Minutes Intravenous  Once 01/15/16 0055 01/15/16 0342   01/14/16 2230  cefTRIAXone (ROCEPHIN) 1 g in dextrose 5 % 50 mL IVPB     1 g 100 mL/hr over 30 Minutes Intravenous  Once 01/14/16 2226 01/14/16 2326      Assessment & Plans: EXPLORATORY LAPAROTOMY, LYSIS OF ADHESIONS, EXTENSIVE DEBRIDEMENT OF SKIN AND SUBCUTANEOUS TISSUE AND MUSCLE, REPAIR OF INCARCERATED INCISION HERNIA WITH BIOLOGIC MESH, APPLICATION OF NEGATIVE PRESSURE DRESSING Discontinue NG tube this AM Begin clear liquid diet Mobilize - up to chair today, ambulate with assist - will order PT consultation VAC dressing  Velora Heckler, MD, [facility] Office: [phone]   Tajee Savant Judie Petit 01/21/2016

## 2016-01-21 NOTE — Plan of Care (Signed)
Problem: Activity: Goal: Risk for activity intolerance will decrease Outcome: Progressing Patient got up to the chair during day shift.  Education is being provided to encourage patient to increase activity.  Patient states she is aware that she needs to increase her activity.

## 2016-01-22 ENCOUNTER — Other Ambulatory Visit: Payer: Self-pay | Admitting: Endocrinology

## 2016-01-22 LAB — CBC
HEMATOCRIT: 26.9 % — AB (ref 36.0–46.0)
HEMOGLOBIN: 8.2 g/dL — AB (ref 12.0–15.0)
MCH: 28.3 pg (ref 26.0–34.0)
MCHC: 30.5 g/dL (ref 30.0–36.0)
MCV: 92.8 fL (ref 78.0–100.0)
PLATELETS: 249 10*3/uL (ref 150–400)
RBC: 2.9 MIL/uL — AB (ref 3.87–5.11)
RDW: 14.3 % (ref 11.5–15.5)
WBC: 4.9 10*3/uL (ref 4.0–10.5)

## 2016-01-22 LAB — BASIC METABOLIC PANEL
ANION GAP: 10 (ref 5–15)
BUN: 16 mg/dL (ref 6–20)
CHLORIDE: 103 mmol/L (ref 101–111)
CO2: 27 mmol/L (ref 22–32)
Calcium: 8.4 mg/dL — ABNORMAL LOW (ref 8.9–10.3)
Creatinine, Ser: 0.52 mg/dL (ref 0.44–1.00)
GFR calc Af Amer: >60 mL/min (ref >60)
GLUCOSE: 212 mg/dL — AB (ref 65–99)
POTASSIUM: 3.6 mmol/L (ref 3.5–5.1)
Sodium: 140 mmol/L (ref 135–145)

## 2016-01-22 LAB — GLUCOSE, CAPILLARY
GLUCOSE-CAPILLARY: 174 mg/dL — AB (ref 65–99)
Glucose-Capillary: 212 mg/dL — ABNORMAL HIGH (ref 65–99)
Glucose-Capillary: 274 mg/dL — ABNORMAL HIGH (ref 65–99)
Glucose-Capillary: 267 mg/dL — ABNORMAL HIGH (ref 65–99)

## 2016-01-22 LAB — MAGNESIUM: Magnesium: 1.6 mg/dL — ABNORMAL LOW (ref 1.7–2.4)

## 2016-01-22 LAB — STREP PNEUMONIAE URINARY ANTIGEN: Strep Pneumo Urinary Antigen: NEGATIVE

## 2016-01-22 MED ORDER — HYDROMORPHONE HCL 1 MG/ML IJ SOLN
1.0000 mg | INTRAMUSCULAR | Status: DC | PRN
  Administered 2016-01-22 (×3): 1 mg via INTRAVENOUS
  Filled 2016-01-22 (×3): qty 1

## 2016-01-22 MED ORDER — VANCOMYCIN HCL 10 G IV SOLR
1250.0000 mg | Freq: Two times a day (BID) | INTRAVENOUS | Status: DC
  Administered 2016-01-23 – 2016-01-26 (×7): 1250 mg via INTRAVENOUS
  Filled 2016-01-22 (×5): qty 1250
  Filled 2016-01-22: qty 1000
  Filled 2016-01-22: qty 1250
  Filled 2016-01-22: qty 1000

## 2016-01-22 MED ORDER — PANTOPRAZOLE SODIUM 40 MG PO TBEC
40.0000 mg | DELAYED_RELEASE_TABLET | Freq: Every day | ORAL | Status: DC
  Administered 2016-01-22 – 2016-01-30 (×9): 40 mg via ORAL
  Filled 2016-01-22 (×10): qty 1

## 2016-01-22 MED ORDER — LORAZEPAM 1 MG PO TABS
1.0000 mg | ORAL_TABLET | Freq: Three times a day (TID) | ORAL | Status: DC | PRN
  Administered 2016-01-22 – 2016-01-30 (×13): 1 mg via ORAL
  Filled 2016-01-22 (×13): qty 1

## 2016-01-22 MED ORDER — MAGNESIUM SULFATE 2 GM/50ML IV SOLN
2.0000 g | Freq: Once | INTRAVENOUS | Status: AC
  Administered 2016-01-22: 2 g via INTRAVENOUS
  Filled 2016-01-22 (×2): qty 50

## 2016-01-22 MED ORDER — CEFEPIME HCL 1 G IJ SOLR
1.0000 g | Freq: Three times a day (TID) | INTRAMUSCULAR | Status: DC
  Filled 2016-01-22: qty 1

## 2016-01-22 MED ORDER — SENNOSIDES-DOCUSATE SODIUM 8.6-50 MG PO TABS
1.0000 | ORAL_TABLET | Freq: Two times a day (BID) | ORAL | Status: DC
  Administered 2016-01-22 – 2016-01-29 (×12): 1 via ORAL
  Filled 2016-01-22 (×14): qty 1

## 2016-01-22 MED ORDER — POLYETHYLENE GLYCOL 3350 17 G PO PACK
17.0000 g | PACK | Freq: Every day | ORAL | Status: DC
  Administered 2016-01-22 – 2016-01-30 (×9): 17 g via ORAL
  Filled 2016-01-22 (×10): qty 1

## 2016-01-22 MED ORDER — FUROSEMIDE 10 MG/ML IJ SOLN
40.0000 mg | Freq: Once | INTRAMUSCULAR | Status: AC
  Administered 2016-01-22: 40 mg via INTRAVENOUS
  Filled 2016-01-22: qty 4

## 2016-01-22 MED ORDER — DEXTROSE 5 % IV SOLN
2.0000 g | Freq: Three times a day (TID) | INTRAVENOUS | Status: DC
  Administered 2016-01-22 – 2016-01-28 (×18): 2 g via INTRAVENOUS
  Filled 2016-01-22 (×20): qty 2

## 2016-01-22 MED ORDER — VANCOMYCIN HCL 10 G IV SOLR
2500.0000 mg | Freq: Once | INTRAVENOUS | Status: AC
  Administered 2016-01-22: 2500 mg via INTRAVENOUS
  Filled 2016-01-22: qty 2500

## 2016-01-22 NOTE — Progress Notes (Signed)
Patient ID: Valerie [name], female   DOB: 1946-07-12, 70 y.o.   MRN: [id number]  General Surgery [facility]  Subjective: Patient in bed, family at bedside, nurse in room.  Tolerated limited clear liquids with NG out yesterday.  Not getting out of bed.  Refuses to have Foley cath removed.  Anxious.  Worries about pain.  Objective: Vital signs in last 24 hours: Temp:  [97.6 F (36.4 C)-98.8 F (37.1 C)] 97.6 F (36.4 C) (01/25 0330) Pulse Rate:  [85-103] 85 (01/25 0400) Resp:  [13-30] 21 (01/25 0400) BP: (130-153)/(52-72) 150/62 mmHg (01/25 0400) SpO2:  [95 %-98 %] 96 % (01/25 0400) Last BM Date: 01/10/16  Intake/Output from previous day: 01/24 0701 - 01/25 0700 In: 490 [P.O.:250; I.V.:80; IV Piggyback:50] Out: 1100 [Urine:975; Emesis/NG output:50; Drains:75] Intake/Output this shift:    Physical Exam: HEENT - sclerae clear, mucous membranes moist Neck - soft Chest - coarse bilaterally Cor - RRR Abdomen - soft, obese; active BS present; VAC dressing intact  Lab Results:   Recent Labs  01/21/16 0340 01/22/16 0330  WBC 4.9 4.9  HGB 8.1* 8.2*  HCT 27.1* 26.9*  PLT 258 249   BMET  Recent Labs  01/21/16 0340 01/22/16 0330  NA 144 140  K 4.0 3.6  CL 106 103  CO2 27 27  GLUCOSE 247* 212*  BUN 17 16  CREATININE 0.59 0.52  CALCIUM 8.8* 8.4*   PT/INR No results for input(s): LABPROT, INR in the last 72 hours. Comprehensive Metabolic Panel:    Component Value Date/Time   NA 140 01/22/2016 0330   NA 144 01/21/2016 0340   K 3.6 01/22/2016 0330   K 4.0 01/21/2016 0340   CL 103 01/22/2016 0330   CL 106 01/21/2016 0340   CO2 27 01/22/2016 0330   CO2 27 01/21/2016 0340   BUN 16 01/22/2016 0330   BUN 17 01/21/2016 0340   CREATININE 0.52 01/22/2016 0330   CREATININE 0.59 01/21/2016 0340   CREATININE 1.27* 03/07/2014 0945   GLUCOSE 212* 01/22/2016 0330   GLUCOSE 247* 01/21/2016 0340   CALCIUM 8.4* 01/22/2016 0330   CALCIUM 8.8* 01/21/2016  0340   AST 16 01/15/2016 0142   AST 19 01/14/2016 2029   ALT 13* 01/15/2016 0142   ALT 13* 01/14/2016 2029   ALKPHOS 61 01/15/2016 0142   ALKPHOS 61 01/14/2016 2029   BILITOT 1.1 01/15/2016 0142   BILITOT 1.0 01/14/2016 2029   PROT 6.5 01/15/2016 0142   PROT 6.8 01/14/2016 2029   ALBUMIN 2.7* 01/15/2016 0142   ALBUMIN 3.0* 01/14/2016 2029    Studies/Results: Dg Chest 1 View  01/20/2016  CLINICAL DATA:  Shortness of breath for 2 days.  Former smoker. EXAM: CHEST 1 VIEW COMPARISON:  01/06/2016 FINDINGS: There is a 2.7 cm nodular opacity in the right midlung. There is bilateral interstitial thickening. There is no pleural effusion or pneumothorax. There is stable cardiomegaly. There is a nasogastric tube coursing below the diaphragm. The osseous structures are unremarkable. IMPRESSION: 1. Cardiomegaly with mild pulmonary vascular congestion. 2. There is a 2.7 cm nodular opacity in the right midlung most concerning for pneumonia. Electronically Signed   By: Elige Ko   On: 01/20/2016 13:47   Ct Chest Wo Contrast  01/21/2016  CLINICAL DATA:  Dyspnea.  Follow-up for pneumonia. EXAM: CT CHEST WITHOUT CONTRAST TECHNIQUE: Multidetector CT imaging of the chest was performed following the standard protocol without IV contrast. COMPARISON:  Chest x-ray 01/20/2016, 01/06/2016 as well as abdominal  pelvic CT 01/14/2016, 01/06/2016 and 05/23/2013 FINDINGS: Lungs are adequately inflated demonstrate patchy bilateral airspace process most prominent over the right upper lobe and left lower lobe. Small left pleural effusion. Right mid to upper lung airspace disease is new compared to the prior chest x-ray 01/06/2016. Consolidation in the lung bases is not significantly changed compared to CT 01/14/2016, although the small left pleural effusion is new. Mild bibasilar bronchiectatic change. Small amount of likely aspirated material along the left posterior aspect of the mid to distal trachea. There is mild stable  cardiomegaly with severe calcification over the mitral valve annulus. Calcified plaque right coronary artery. Calcified plaque over the thoracic aorta. 1.4 cm precarinal lymph node likely reactive. Few other smaller mediastinal lymph nodes. Remaining mediastinal structures are within normal. 8-9 mm hypodense nodule over the right lobe of the thyroid likely benign. Images over the upper abdomen are unremarkable. Mild degenerate change of the spine. IMPRESSION: Multifocal airspace process worse over the right upper lobe and left base likely multifocal pneumonia and likely secondary to aspiration with small amount of aspirate material noted within the left posterior aspect of the mid to distal trachea. Small left pleural effusion. Minimal reactive mediastinal adenopathy. Stable cardiomegaly with severe calcification of the mitral valve annulus. 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 Electronically Signed   By: Elberta Fortis M.D.   On: 01/21/2016 16:25    Assessment & Plans: EXPLORATORY LAPAROTOMY, LYSIS OF ADHESIONS, EXTENSIVE DEBRIDEMENT OF SKIN AND SUBCUTANEOUS TISSUE AND MUSCLE, REPAIR OF INCARCERATED INCISION HERNIA WITH BIOLOGIC MESH, APPLICATION OF NEGATIVE PRESSURE DRESSING Advance to full liquid diet Mobilize - up to chair today, ambulate with assist - PT consultation requested VAC dressing per WOC nurse Pneumonia  CT scan reviewed  Pulmonary toilet, mobilize  Abx per medical service Anxiety, pain control  Will ask medical service to address - ?anxiolytic  Will need to convert from IV to po as diet improves  Likely needs to remain in stepdown for now due to immobility and requirements for nursing care.    Velora Heckler, MD, [facility] Office: [phone]   Betti Goodenow Judie Petit 01/22/2016

## 2016-01-22 NOTE — Telephone Encounter (Signed)
LM for pt to call back to schedule °

## 2016-01-22 NOTE — Consult Note (Signed)
WOC wound follow up Wound type:Open abdominal wound Measurement:(positional)  16.5cm x 20cm x 2.5cm.  Undermining from 5-8 o'clock measures 3.5cm Wound bed:red, moist with 5 x 7cm area of exposed mesh v. pale tissue in center Drainage (amount, consistency, odor) Large amounts of serosanguinous (cannister changed today) Periwound:intact Dressing procedure/placement/frequency:NPWT dressing changed after wound cleansed with NS. Two (2) pieces of non-adherent silicone dressing used to protect wound bed (Mepitel).  Three (3) pieces of black foam used to obliterate dead space and drape applied.  Attached to negative continues pressure and an immediate seal is achieved.  Patient was premedicated prior to dressing change and is anxious today.  Reports some relief from familiar person and voices while dressing is changed today.  Next dressing change is due on Friday, 01/24/16. WOC nursing team will follow, and will remain available to this patient, the nursing, surgical and medical teams.   Thanks, Ladona Mow, MSN, RN, GNP, [place]  Pager# [phone]

## 2016-01-22 NOTE — Progress Notes (Signed)
ANTIBIOTIC CONSULT NOTE - INITIAL  Pharmacy Consult for vancomycin/cefepime Indication: HCAP vs aspiration, multifocal PNA  No Known Allergies  Patient Measurements: Height:  (162.6 cm) Weight: 286 lb 13.1 oz (130.1 kg) IBW/kg (Calculated) : 54.7 Adjusted Body Weight:   Vital Signs: Temp: 97.9 F (36.6 C) (01/25 1135) Temp Source: Oral (01/25 1135) BP: 140/74 mmHg (01/25 1200) Pulse Rate: 86 (01/25 1200) Intake/Output from previous day: 01/24 0701 - 01/25 0700 In: 490 [P.O.:250; I.V.:80; IV Piggyback:50] Out: 1100 [Urine:975; Emesis/NG output:50; Drains:75] Intake/Output from this shift: Total I/O In: 410 [P.O.:300; Other:60; IV Piggyback:50] Out: 300 [Urine:300]  Labs:  Recent Labs  01/20/16 0337 01/21/16 0340 01/22/16 0330  WBC 6.3 4.9 4.9  HGB 7.7* 8.1* 8.2*  PLT 222 258 249  CREATININE 0.63 0.59 0.52   Estimated Creatinine Clearance: 86.4 mL/min (by C-G formula based on Cr of 0.52). No results for input(s): VANCOTROUGH, VANCOPEAK, VANCORANDOM, GENTTROUGH, GENTPEAK, GENTRANDOM, TOBRATROUGH, TOBRAPEAK, TOBRARND, AMIKACINPEAK, AMIKACINTROU, AMIKACIN in the last 72 hours.   Microbiology: Recent Results (from the past 720 hour(s))  Urine culture     Status: None   Collection Time: 01/05/16  1:30 PM  Result Value Ref Range Status   Specimen Description URINE, CLEAN CATCH  Final   Special Requests none Normal  Final   Culture   Final    >=100,000 COLONIES/mL KLEBSIELLA PNEUMONIAE Performed at [facility]    Report Status 01/07/2016 FINAL  Final   Organism ID, Bacteria KLEBSIELLA PNEUMONIAE  Final      Susceptibility   Klebsiella pneumoniae - MIC*    AMPICILLIN >=32 RESISTANT Resistant     CEFAZOLIN <=4 SENSITIVE Sensitive     CEFTRIAXONE <=1 SENSITIVE Sensitive     CIPROFLOXACIN <=0.25 SENSITIVE Sensitive     GENTAMICIN <=1 SENSITIVE Sensitive     IMIPENEM <=0.25 SENSITIVE Sensitive     NITROFURANTOIN 64 INTERMEDIATE Intermediate      TRIMETH/SULFA <=20 SENSITIVE Sensitive     AMPICILLIN/SULBACTAM 8 SENSITIVE Sensitive     PIP/TAZO <=4 SENSITIVE Sensitive     * >=100,000 COLONIES/mL KLEBSIELLA PNEUMONIAE  Blood culture (routine x 2)     Status: None   Collection Time: 01/06/16  3:13 PM  Result Value Ref Range Status   Specimen Description BLOOD LEFT ANTECUBITAL  Final   Special Requests BOTTLES DRAWN AEROBIC AND ANAEROBIC  Final   Culture   Final    NO GROWTH 5 DAYS Performed at [facility]    Report Status 01/11/2016 FINAL  Final  Blood culture (routine x 2)     Status: None   Collection Time: 01/06/16  3:14 PM  Result Value Ref Range Status   Specimen Description BLOOD RIGHT FOREARM  Final   Special Requests BOTTLES DRAWN AEROBIC ONLY  Final   Culture   Final    NO GROWTH 5 DAYS Performed at [facility]    Report Status 01/11/2016 FINAL  Final  Culture, blood (routine x 2)     Status: None   Collection Time: 01/15/16  1:40 AM  Result Value Ref Range Status   Specimen Description BLOOD RIGHT HAND  Final   Special Requests BOTTLES DRAWN AEROBIC ONLY 5CC  Final   Culture   Final    NO GROWTH 5 DAYS Performed at [facility]    Report Status 01/20/2016 FINAL  Final  Culture, blood (routine x 2)     Status: None   Collection Time: 01/15/16  1:49 AM  Result Value Ref Range Status   Specimen Description BLOOD LEFT HAND  Final   Special Requests BOTTLES DRAWN AEROBIC ONLY 5CC  Final   Culture   Final    NO GROWTH 5 DAYS Performed at [facility]    Report Status 01/20/2016 FINAL  Final  Urine culture     Status: None   Collection Time: 01/15/16 10:21 AM  Result Value Ref Range Status   Specimen Description URINE, CLEAN CATCH  Final   Special Requests NONE  Final   Culture   Final    50,000 COLONIES/mL YEAST Performed at [facility]    Report Status 01/16/2016 FINAL  Final  Surgical pcr screen     Status: None   Collection Time: 01/15/16 10:21 AM   Result Value Ref Range Status   MRSA, PCR NEGATIVE NEGATIVE Final   Staphylococcus aureus NEGATIVE NEGATIVE Final    Comment:        The Xpert SA Assay (FDA approved for NASAL specimens in patients over 39 years of age), is one component of a comprehensive surveillance program.  Test performance has been validated by [facility] for patients greater than or equal to 35 year old. It is not intended to diagnose infection nor to guide or monitor treatment.     Medical History: History reviewed. No pertinent past medical history.  Medications:  Infusions:   Assessment: Pt is a 70 y.o. female with past medical history significant for overactive bladder, chronic UTIs, necrotizing pancreatitis status post partial pancreatectomy, morbid obesity, type 2 diabetes mellitus, just recently discharged after being hospitalized for small bowel obstruction. Patient presented to [facility] 01/14/2016 with worsening nausea, vomiting and abdominal discomfort for past day prior to this admission.  On 1/25 pt CT chest with developing HCAP vs aspiration, multifocal PNA and pharmacy was consulted to dose vancomycin and cefepime. 1/25: Wt: 130.1 Kg, SCr 0.52, CrCl: 72 ml/min ( N - using SCr of 0.8)    Antimicrobials this admission: 1/18 >>zosyn  >> 1/20  1/18 >>vancomycin  >>  1/20 1/25>> 1/20 >> fluconazole >>  1/20 >> ceftriaxone >> 1/21 1/25 >> cefepime >>  Microbiology Results: (Past cxt from 1/10 Urine = Klebsiella, (R to ampicillin, I to nitrofurantoin, S to rest) 1/18 blood: ngtd 1/18 urine: 50,000 colonies of yeast  1/18 MRSA PCR negative 1/25 sputum :  1/25 Strep pneumo antigen  Goal of Therapy:   Vancomycin trough level 15-20 mcg/ml   Eradication of infection  Plan:   Follow up culture results   Monitor clinical course  Vancomycin 2500 mg IV X 1, then vancomycin 1250 mg IV q12h. X 8 days  as per pharmacy consult  Cefepime 2 gr IV q8h x 8 days due to pt good  renal function, pt has been in   ICU for several days and  pt wt. Adalberto Cole, PharmD, BCPS Pager [phone] 01/22/2016 1:52 PM

## 2016-01-22 NOTE — Progress Notes (Addendum)
Patient ID: Valerie [name], female   DOB: [birth year], 70 y.o.   MRN: [id number] [facility] PROGRESS NOTE  Valerie [name] WUJ:[id number] DOB: 1946/04/17 DOA: 01/14/2016 PCP: Garth Schlatter, MD  Brief narrative:    70 y.o. female with past medical history significant for overactive bladder, chronic UTIs, necrotizing pancreatitis status post partial pancreatectomy, morbid obesity, type 2 diabetes mellitus, just recently discharged after being hospitalized for small bowel obstruction. Patient presented to [facility] 01/14/2016 with worsening nausea, vomiting and abdominal discomfort for past day prior to this admission. CT abdomen and pelvis on the admission demonstrated small bowel obstruction with proximal ileum entrapment in the ventral hernia.  Patient is status post exploratory laparotomy, LOA, extensive debridement of skin and sq tissue and muscle, repair of incarcerated incisional hernia with biologic mesh, VAC application by Dr. Derrell Lolling 01/15/2016 for Incarcerated incisional hernias, multiple, with small bowel obstruction ischemic fat necrosis of skin and subcutaneous tissue and fascia. She has midline VAC placed and NG tube, managed by surgery.  Major events since admission: 1/18 - ex lap by Dr. Derrell Lolling, SDU post op 1/21 - improving slowly, NGT in place  1/23 - allowing sips  1/24 - NGT out, advancing diet slowly  1/25 - CT chest with developing HCAP vs aspiration, multifocal PNA, added bread spectrum ABX   Assessment/Plan:    Principal problem: Exploratory laparotomy / repair of incarcerated incisional hernia / Leukocytosis  - CT abdomen and pelvis on the admission demonstrated small bowel obstruction with proximal ileum entrapment in the ventral hernia - Status post exploratory laparotomy, LOA, extensive debridement of skin and sq tissue and muscle, repair of incarcerated incisional hernia with biologic mesh, VAC application by Dr. Derrell Lolling 01/15/2016 for incarcerated  incisional hernias, multiple, with small bowel obstruction ischemic fat necrosis of skin and subcutaneous tissue and fascia.  - post op day #7 - Management per surgery - She was initially on broad spectrum abx, vanco and zosyn. Stopped 1/20 and transitioned to Rocephin IV 1/20 - Blood cultures so far negative - Continue VAC dressing changes MWF, appreciate wound care team assistance  - Continue supportive care with analgesia and antiemetics as needed  - minimize narcotic as possible   Principal problem: ? A-fib - reported by nursing staff - please note that this was reviewed and discussed with cardiology on call, no A-FIB - repeat EKG with NSR  Urinary tract infection, yeast  - Has had Klebsiella UTI during recent hospitalization. Urine culture on this admission growing yeast - continue Fluconzole day #6/7 (started 1/20)  Acute dyspnea - crackles on exam, CXR with pulmonary vascular congestion and possibly developing multifocal PNA vs HCAP and aspiration  - weight up from 278 --> 292 --> 286 lbs after one dose of Lasix 40 mg IV given on 1/23 - give one dose of Lasix today 1/25 40 mg IV  - monitor volume status closely  - broaden the ABX coverage for HCAP and aspiration   Acute renal failure superimposed on chronic kidney disease stage 4 - Baseline creatinine is 1.34 and on this admission acutely elevated at 2.15 likely in the setting of acute illness - Creatinine remains WNL - BMP in AM  Hypomagnesemia  - low this AM 1.7 , will give additional 2 gm Mg IV - repeat Mg level in AM  Anemia of chronic disease / Acute post operative blood loss anemia  - Due to combination of CKD and postoperative blood loss anemia  - Hemoglobin 8.2 today, hold off on transfusion  for now and transfuse if Hg < 7 - Check CBC in am  Dyslipidemia associated with diabetes mellitus type 2 - Resume home meds Lipitor 10 mg at bedtime, omega 3 and fenofibrate when able to tolerate solids   Essential  hypertension, benign - Hydralazine 10 mg IV every 4 hours as needed for BP above 150/90  - for now, BP has been reasonably stable   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 - Has NG tube, allowing sips today   DVT Prophylaxis  - Heparin subQ while pt in hospital   Code Status: Full.  Family Communication:  plan of care discussed with the patient and family at bedside  Disposition Plan: Remains in SDU, home once tolerating diet   IV access:  Peripheral IV  Procedures and diagnostic studies:    Exploratory laparotomy, LOA, extensive debridement of skin and sq tissue and muscle, repair of incarcerated incisional hernia with biologic mesh, VAC application---Dr. Derrell Lolling 01/15/2016 for Incarcerated incisional hernias, multiple, with small bowel obstruction Ischemic fat necrosis of skin and subcutaneous tissue and fascia.  Ct Abdomen Pelvis Wo Contrast 01/14/2016  1. Small bowel obstruction related to entrapment of proximal ileum in a small ventral hernia. Proximal small bowel is dilated with multiple air-fluid levels measuring up to 5.6 cm in diameter. Distal small bowel is completely decompressed, and colon is largely decompressed as well. Emergent surgical consultation is strongly recommended. 2. Extensive airspace disease in the lung bases bilaterally (left greater than right), concerning for potential aspiration pneumonia. 3. Cholelithiasis without evidence of acute cholecystitis at this time. 4. Chronic mild right hydronephrosis with urothelial thickening in the right renal collecting system, poorly evaluated on today's noncontrast CT examination, but similar in retrospect to prior studies. Nonemergent Urology consultation is recommended for further  evaluation. 5. Atherosclerosis, including right coronary artery disease. 6. There are calcifications of the mitral valve and mitral annulus. Echocardiographic correlation for evaluation of potential valvular dysfunction may be warranted if clinically indicated. 7. Additional incidental findings, as above. Electronically Signed   By: Trudie Reed M.D.   On: 01/14/2016 22:13   Dg Chest 1 View 01/20/2016  1. Cardiomegaly with mild pulmonary vascular congestion. 2. There is a 2.7 cm nodular opacity in the right midlung most concerning for pneumonia.   Ct Chest Wo Contrast 01/21/2016  Multifocal airspace process worse over the right upper lobe and left base likely multifocal pneumonia and likely secondary to aspiration with small amount of aspirate material noted within the left posterior aspect of the mid to distal trachea. Small left pleural effusion. Minimal reactive mediastinal adenopathy. Stable cardiomegaly with severe calcification of the mitral valve annulus. 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   Medical Consultants:  Surgery, Dr. Claud Kelp  Other Consultants:  DM coordinator PT  IAnti-Infectives:   Vanco ans zosyn 01/15/2016 --> 1/20 Rocephin 1/20 -->  Debbora Presto, MD [facility] Pager [phone]  If 7PM-7AM, please contact night-coverage [email or web address] Password [facility] 01/22/2016, 12:53 PM   LOS: 8 days   HPI/Subjective: No acute overnight events. Patient reports feeling better, anxious   Objective: Filed Vitals:   01/22/16 0800 01/22/16 0830 01/22/16 1135 01/22/16 1200  BP: 118/74   140/74  Pulse: 79  86  Temp:  98.2 F (36.8 C) 97.9 F (36.6 C)   TempSrc:  Oral Oral   Resp: 27   19  Height:      Weight:      SpO2: 93%   97%    Intake/Output Summary (Last 24 hours) at 01/22/16 1253 Last data filed at 01/22/16 1200  Gross per 24 hour  Intake    660 ml  Output   1175 ml  Net    -515 ml    Exam:   General:  Pt is not in acute distress, sitting in chair   Cardiovascular: Rate controlled, appreciate S1, S2   Respiratory: Crackles at bases with scattered rhonchi   Abdomen: VAC in place, NGT in place, tender in mid abdomen with voluntary guarding  Extremities: pulses palpable, + 1 bilateral LE edema   Neuro: Nonfocal  Data Reviewed: Basic Metabolic Panel:  Recent Labs Lab 01/18/16 0355 01/19/16 0408 01/20/16 0337 01/21/16 0340 01/22/16 0330  NA 145 142 141 144 140  K 4.3 4.4 4.4 4.0 3.6  CL 110 108 107 106 103  CO2 24 21* 21* 27 27  GLUCOSE 200* 233* 242* 247* 212*  BUN 29* 25* 21* 17 16  CREATININE 0.79 0.73 0.63 0.59 0.52  CALCIUM 9.1 8.9 8.6* 8.8* 8.4*  MG 1.7 1.6* 1.7 1.7 1.6*   CBC:  Recent Labs Lab 01/18/16 0355 01/19/16 0408 01/20/16 0337 01/21/16 0340 01/22/16 0330  WBC 9.4 6.6 6.3 4.9 4.9  HGB 7.9* 8.0* 7.7* 8.1* 8.2*  HCT 26.1* 26.5* 26.1* 27.1* 26.9*  MCV 92.2 94.6 91.9 93.4 92.8  PLT 241 254 222 258 249   CBG:  Recent Labs Lab 01/21/16 1224 01/21/16 1609 01/21/16 2121 01/22/16 0834 01/22/16 1152  GLUCAP 245* 202* 171* 212* 174*    Recent Results (from the past 240 hour(s))  Culture, blood (routine x 2)     Status: None (Preliminary result)   Collection Time: 01/15/16  1:40 AM  Result Value Ref Range Status   Specimen Description BLOOD RIGHT HAND  Final   Special Requests BOTTLES DRAWN AEROBIC ONLY 5CC  Final   Culture   Final    NO GROWTH 3 DAYS Performed at [facility]    Report Status PENDING  Incomplete  Culture, blood (routine x 2)     Status: None (Preliminary result)   Collection Time: 01/15/16  1:49 AM  Result Value Ref Range Status   Specimen Description BLOOD LEFT HAND  Final   Special Requests BOTTLES DRAWN AEROBIC ONLY 5CC  Final   Culture   Final    NO GROWTH 3 DAYS Performed at [facility]    Report Status PENDING  Incomplete  Urine culture     Status: None    Collection Time: 01/15/16 10:21 AM  Result Value Ref Range Status   Specimen Description URINE, CLEAN CATCH  Final   Special Requests NONE  Final   Culture   Final    50,000 COLONIES/mL YEAST Performed at [facility]    Report Status 01/16/2016 FINAL  Final  Surgical pcr screen     Status: None   Collection Time: 01/15/16 10:21 AM  Result Value Ref Range Status   MRSA, PCR NEGATIVE NEGATIVE Final   Staphylococcus aureus NEGATIVE NEGATIVE Final    Scheduled Meds: . chlorhexidine gluconate  15 mL Mouth/Throat QID  . fluconazole (DIFLUCAN) IV  100 mg Intravenous Q24H  . fluticasone  2 spray Each Nare Daily  .  heparin  5,000 Units Subcutaneous 3 times per day  . insulin aspart  0-20 Units Subcutaneous TID WC  . insulin glargine  20 Units Subcutaneous QHS  . pantoprazole IV  40 mg Intravenous QHS   Continuous Infusions:

## 2016-01-22 NOTE — Progress Notes (Signed)
Dr Gerrit Friends in and talked with patient. She refuses to have foley removed, UTI has been discissed with patient.

## 2016-01-23 LAB — BASIC METABOLIC PANEL
Anion gap: 13 (ref 5–15)
BUN: 12 mg/dL (ref 6–20)
CHLORIDE: 100 mmol/L — AB (ref 101–111)
CO2: 25 mmol/L (ref 22–32)
CREATININE: 0.71 mg/dL (ref 0.44–1.00)
Calcium: 8.5 mg/dL — ABNORMAL LOW (ref 8.9–10.3)
GFR calc Af Amer: >60 mL/min (ref >60)
GFR calc non Af Amer: >60 mL/min (ref >60)
GLUCOSE: 323 mg/dL — AB (ref 65–99)
POTASSIUM: 3.6 mmol/L (ref 3.5–5.1)
Sodium: 138 mmol/L (ref 135–145)

## 2016-01-23 LAB — GLUCOSE, CAPILLARY
GLUCOSE-CAPILLARY: 329 mg/dL — AB (ref 65–99)
GLUCOSE-CAPILLARY: 216 mg/dL — AB (ref 65–99)
GLUCOSE-CAPILLARY: 247 mg/dL — AB (ref 65–99)
Glucose-Capillary: 292 mg/dL — ABNORMAL HIGH (ref 65–99)
Glucose-Capillary: 185 mg/dL — ABNORMAL HIGH (ref 65–99)

## 2016-01-23 LAB — CBC
HCT: 28.1 % — ABNORMAL LOW (ref 36.0–46.0)
HEMOGLOBIN: 8.4 g/dL — AB (ref 12.0–15.0)
MCH: 27.6 pg (ref 26.0–34.0)
MCHC: 29.9 g/dL — AB (ref 30.0–36.0)
MCV: 92.4 fL (ref 78.0–100.0)
Platelets: 233 10*3/uL (ref 150–400)
RBC: 3.04 MIL/uL — AB (ref 3.87–5.11)
RDW: 14.4 % (ref 11.5–15.5)
WBC: 4.4 10*3/uL (ref 4.0–10.5)

## 2016-01-23 LAB — MAGNESIUM: Magnesium: 1.7 mg/dL (ref 1.7–2.4)

## 2016-01-23 MED ORDER — OXYCODONE HCL 5 MG PO TABS
5.0000 mg | ORAL_TABLET | ORAL | Status: DC | PRN
  Administered 2016-01-23: 5 mg via ORAL
  Administered 2016-01-23: 10 mg via ORAL
  Administered 2016-01-23: 5 mg via ORAL
  Administered 2016-01-24 (×2): 10 mg via ORAL
  Administered 2016-01-25: 5 mg via ORAL
  Administered 2016-01-25 – 2016-01-27 (×7): 10 mg via ORAL
  Administered 2016-01-28 (×2): 5 mg via ORAL
  Administered 2016-01-28: 10 mg via ORAL
  Administered 2016-01-28 – 2016-01-30 (×7): 5 mg via ORAL
  Filled 2016-01-23 (×2): qty 2
  Filled 2016-01-23: qty 1
  Filled 2016-01-23: qty 2
  Filled 2016-01-23: qty 1
  Filled 2016-01-23 (×2): qty 2
  Filled 2016-01-23 (×2): qty 1
  Filled 2016-01-23 (×2): qty 2
  Filled 2016-01-23: qty 1
  Filled 2016-01-23: qty 2
  Filled 2016-01-23: qty 1
  Filled 2016-01-23: qty 2
  Filled 2016-01-23: qty 1
  Filled 2016-01-23: qty 2
  Filled 2016-01-23: qty 1
  Filled 2016-01-23: qty 2
  Filled 2016-01-23: qty 1
  Filled 2016-01-23: qty 2
  Filled 2016-01-23 (×2): qty 1

## 2016-01-23 MED ORDER — BISACODYL 10 MG RE SUPP
10.0000 mg | Freq: Every day | RECTAL | Status: DC | PRN
  Filled 2016-01-23: qty 1

## 2016-01-23 MED ORDER — HYDROMORPHONE HCL 1 MG/ML IJ SOLN
1.0000 mg | INTRAMUSCULAR | Status: DC | PRN
  Administered 2016-01-24 – 2016-01-28 (×14): 1 mg via INTRAVENOUS
  Filled 2016-01-23 (×17): qty 1

## 2016-01-23 MED ORDER — METHOCARBAMOL 500 MG PO TABS
1000.0000 mg | ORAL_TABLET | Freq: Three times a day (TID) | ORAL | Status: DC
  Administered 2016-01-23 – 2016-01-30 (×22): 1000 mg via ORAL
  Filled 2016-01-23 (×27): qty 2

## 2016-01-23 MED ORDER — FUROSEMIDE 10 MG/ML IJ SOLN
20.0000 mg | Freq: Every day | INTRAMUSCULAR | Status: DC
  Administered 2016-01-23 – 2016-01-28 (×6): 20 mg via INTRAVENOUS
  Filled 2016-01-23 (×6): qty 2

## 2016-01-23 MED ORDER — MAGNESIUM SULFATE 2 GM/50ML IV SOLN
2.0000 g | Freq: Once | INTRAVENOUS | Status: AC
  Administered 2016-01-23: 2 g via INTRAVENOUS
  Filled 2016-01-23: qty 50

## 2016-01-23 NOTE — Progress Notes (Signed)
Physical Therapy Treatment Patient Details Name: Valerie [name] MRN: [id number] DOB: 1946-03-24 Today's Date: 01/23/2016    History of Present Illness patient is 70 yo female admitted 01/14/16 is status post exploratory laparotomy, extensive debridement of skin and sq tissue and muscle, repair of incarcerated incisional hernia with biologic mesh, VAC application by Dr. Derrell Lolling 01/15/2016 for Incarcerated incisional hernias, multiple, with small bowel obstruction ischemic fat necrosis of skin and subcutaneous tissue and fascia. She has midline VAC placed and NG tube, managed by surgery.    PT Comments    Patient did not want to get up but needed to due to incontinence. Is able to pivot with RW, weak this visit.  Follow Up Recommendations  SNF;Supervision/Assistance - 24 hour     Equipment Recommendations  None recommended by PT    Recommendations for Other Services       Precautions / Restrictions Precautions Precautions: Fall Precaution Comments: , VAC, incontinent, on lasix    Mobility  Bed Mobility   Bed Mobility: Rolling;Sidelying to Sit Rolling: Mod assist Sidelying to sit: HOB elevated;+2 for physical assistance;+2 for safety/equipment;Max assist       General bed mobility comments: increased time and use of B rails Assist for trunk to upright position, much more effort  this visit, patient  not wanting to get up, had been incontinent of BM/urine.  Transfers Overall transfer level: Needs assistance Equipment used: Rolling walker (2 wheeled) Transfers: Sit to/from [employer] Sit to Stand: Mod assist;+2 physical assistance;+2 safety/equipment Stand pivot transfers: Mod assist;+2 physical assistance;+2 safety/equipment       General transfer comment: extra time , multimodal cues for reaching to armrest, for participation and safety; small pivotal steps to recliner;   Ambulation/Gait                 Stairs            Wheelchair  Mobility    Modified Rankin (Stroke Patients Only)       Balance                                    Cognition Arousal/Alertness: Awake/alert Behavior During Therapy: Anxious                        Exercises      General Comments        Pertinent Vitals/Pain Pain Assessment: Faces Faces Pain Scale: Hurts whole lot Pain Descriptors / Indicators: Crying;Discomfort;Grimacing Pain Intervention(s): Patient requesting pain meds-RN notified;Limited activity within patient's tolerance;Monitored during session    Home Living                      Prior Function            PT Goals (current goals can now be found in the care plan section) Progress towards PT goals: Progressing toward goals    Frequency  Min 3X/week    PT Plan Current plan remains appropriate    Co-evaluation             End of Session Equipment Utilized During Treatment: Gait belt Activity Tolerance: Patient limited by pain Patient left: in chair;with call bell/phone within reach;with nursing/sitter in room;with family/visitor present     Time: 4098-1191 PT Time Calculation (min) (ACUTE ONLY): 22 min  Charges:  $Therapeutic Activity: 8-22 mins  G Codes:      Rada Hay 01/23/2016, 4:56 PM Blanchard Kelch PT [phone]

## 2016-01-23 NOTE — Progress Notes (Signed)
[facility] Progress Note  8 Days Post-Op  Subjective: Pt still c/o pain, but wiling to try orals and muscle relaxer.  She c/o being anxious.  Having BM's and flatus.  Tolerating full liquids without N/V.  Mobilizing with therapy.    Objective: Vital signs in last 24 hours: Temp:  [97.9 F (36.6 C)-98.3 F (36.8 C)] 97.9 F (36.6 C) (01/26 0200) Pulse Rate:  [79-134] 104 (01/26 0600) Resp:  [15-34] 34 (01/26 0600) BP: (118-148)/(55-74) 148/55 mmHg (01/25 2100) SpO2:  [93 %-100 %] 96 % (01/26 0600) Weight:  [127 kg (279 lb 15.8 oz)] 127 kg (279 lb 15.8 oz) (01/26 0500) Last BM Date: 01/09/16  Intake/Output from previous day: 01/25 0701 - 01/26 0700 In: 1790 [P.O.:780; IV Piggyback:950] Out: 2250 [Urine:2250] Intake/Output this shift:    PE: Gen:  Alert, NAD, pleasant Card:  RRR, no M/G/R heard Pulm:  CTA, no W/R/R Abd: Soft, mild distension, mild tenderness, great BS, no HSM, wound vac in place, Vac canister with serosanguinous drainage, large triangular open wound with staples closing laterally, wound is clean and dry Ext:  Left leg more edematous and slightly more erythematous than right   01/23/16 vac change   Lab Results:   Recent Labs  01/22/16 0330 01/23/16 0350  WBC 4.9 4.4  HGB 8.2* 8.4*  HCT 26.9* 28.1*  PLT 249 233   BMET  Recent Labs  01/22/16 0330 01/23/16 0350  NA 140 138  K 3.6 3.6  CL 103 100*  CO2 27 25  GLUCOSE 212* 323*  BUN 16 12  CREATININE 0.52 0.71  CALCIUM 8.4* 8.5*   PT/INR No results for input(s): LABPROT, INR in the last 72 hours. CMP     Component Value Date/Time   NA 138 01/23/2016 0350   K 3.6 01/23/2016 0350   CL 100* 01/23/2016 0350   CO2 25 01/23/2016 0350   GLUCOSE 323* 01/23/2016 0350   BUN 12 01/23/2016 0350   CREATININE 0.71 01/23/2016 0350   CREATININE 1.27* 03/07/2014 0945   CALCIUM 8.5* 01/23/2016 0350   PROT 6.5 01/15/2016 0142   ALBUMIN 2.7* 01/15/2016 0142   AST 16 01/15/2016 0142   ALT 13* 01/15/2016 0142   ALKPHOS 61 01/15/2016 0142   BILITOT 1.1 01/15/2016 0142   GFRNONAA >60 01/23/2016 0350   GFRAA >60 01/23/2016 0350   Lipase     Component Value Date/Time   LIPASE 24 01/14/2016 2029       Studies/Results: Ct Chest Wo Contrast  01/21/2016  CLINICAL DATA:  Dyspnea.  Follow-up for pneumonia. EXAM: CT CHEST WITHOUT CONTRAST TECHNIQUE: Multidetector CT imaging of the chest was performed following the standard protocol without IV contrast. COMPARISON:  Chest x-ray 01/20/2016, 01/06/2016 as well as abdominal pelvic CT 01/14/2016, 01/06/2016 and 05/23/2013 FINDINGS: Lungs are adequately inflated demonstrate patchy bilateral airspace process most prominent over the right upper lobe and left lower lobe. Small left pleural effusion. Right mid to upper lung airspace disease is new compared to the prior chest x-ray 01/06/2016. Consolidation in the lung bases is not significantly changed compared to CT 01/14/2016, although the small left pleural effusion is new. Mild bibasilar bronchiectatic change. Small amount of likely aspirated material along the left posterior aspect of the mid to distal trachea. There is mild stable cardiomegaly with severe calcification over the mitral valve annulus. Calcified plaque right coronary artery. Calcified plaque over the thoracic aorta. 1.4 cm precarinal lymph node likely reactive. Few other smaller mediastinal lymph nodes. Remaining mediastinal structures  are within normal. 8-9 mm hypodense nodule over the right lobe of the thyroid likely benign. Images over the upper abdomen are unremarkable. Mild degenerate change of the spine. IMPRESSION: Multifocal airspace process worse over the right upper lobe and left base likely multifocal pneumonia and likely secondary to aspiration with small amount of aspirate material noted within the left posterior aspect of the mid to distal trachea. Small left pleural effusion. Minimal reactive mediastinal adenopathy.  Stable cardiomegaly with severe calcification of the mitral valve annulus. 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 Electronically Signed   By: Elberta Fortis M.D.   On: 01/21/2016 16:25    Anti-infectives: Anti-infectives    Start     Dose/Rate Route Frequency Ordered Stop   01/23/16 0200  vancomycin (VANCOCIN) 1,250 mg in sodium chloride 0.9 % 250 mL IVPB     1,250 mg 166.7 mL/hr over 90 Minutes Intravenous Every 12 hours 01/22/16 1323 01/31/16 0159   01/22/16 1400  ceFEPIme (MAXIPIME) 1 g in dextrose 5 % 50 mL IVPB  Status:  Discontinued     1 g 100 mL/hr over 30 Minutes Intravenous 3 times per day 01/22/16 1303 01/22/16 1344   01/22/16 1400  ceFEPIme (MAXIPIME) 2 g in dextrose 5 % 50 mL IVPB     2 g 100 mL/hr over 30 Minutes Intravenous 3 times per day 01/22/16 1344 01/30/16 1359   01/22/16 1330  vancomycin (VANCOCIN) 2,500 mg in sodium chloride 0.9 % 500 mL IVPB     2,500 mg 250 mL/hr over 120 Minutes Intravenous  Once 01/22/16 1323 01/22/16 1543   01/17/16 1700  cefTRIAXone (ROCEPHIN) 2 g in dextrose 5 % 50 mL IVPB    Comments:  Pharmacy may adjust dosing strength / duration / interval for maximal efficacy   2 g 100 mL/hr over 30 Minutes Intravenous Every 24 hours 01/17/16 1625 01/18/16 1703   01/17/16 1200  fluconazole (DIFLUCAN) IVPB 100 mg     100 mg 50 mL/hr over 60 Minutes Intravenous Every 24 hours 01/17/16 1019     01/17/16 0600  vancomycin (VANCOCIN) 1,750 mg in sodium chloride 0.9 % 500 mL IVPB  Status:  Discontinued     1,750 mg 250 mL/hr over 120 Minutes Intravenous Every 48 hours 01/15/16 0146 01/16/16 1126   01/16/16 1300  vancomycin (VANCOCIN) 1,500 mg in sodium chloride 0.9 % 500 mL IVPB  Status:  Discontinued     1,500 mg 250 mL/hr over 120 Minutes Intravenous Every 24 hours 01/16/16 1125 01/17/16 1208   01/16/16 0600  cefoTEtan (CEFOTAN) 2 g in dextrose 5 % 50 mL IVPB  Status:  Discontinued     2 g 100 mL/hr  over 30 Minutes Intravenous On call to O.R. 01/15/16 1700 01/15/16 1709   01/15/16 1130  cefTRIAXone (ROCEPHIN) 1 g in dextrose 5 % 50 mL IVPB  Status:  Discontinued     1 g 100 mL/hr over 30 Minutes Intravenous Every 24 hours 01/15/16 1123 01/15/16 1124   01/15/16 0200  piperacillin-tazobactam (ZOSYN) IVPB 3.375 g  Status:  Discontinued     3.375 g 12.5 mL/hr over 240 Minutes Intravenous 3 times per day 01/15/16 0049 01/17/16 1625   01/15/16 0130  vancomycin (VANCOCIN) 2,500 mg in sodium chloride 0.9 % 500 mL IVPB     2,500 mg 250 mL/hr over 120 Minutes Intravenous  Once 01/15/16 0055 01/15/16 0342   01/14/16 2230  cefTRIAXone (ROCEPHIN) 1 g in dextrose 5 %  50 mL IVPB     1 g 100 mL/hr over 30 Minutes Intravenous  Once 01/14/16 2226 01/14/16 2326     Major events since admission: 1/18 - ex lap by Dr. Derrell Lolling, SDU post op 1/21 - improving slowly, NGT in place  1/23 - allowing sips  1/24 - NGT out, advancing diet slowly  1/25 - CT chest with developing HCAP vs aspiration, multifocal PNA, added broad spectrum ABX    Assessment/Plan Incarcerated incisional hernias, multiple, with small bowel obstruction Ischemic fat necrosis of skin and subcutaneous tissue and fascia POD #8 Exploratory laparotomy with extensive lysis of adhesions for release of mechanical small bowel obstruction, Extensive debridement of skin, subcutaneous tissue, and muscle fascia (see below), Repair complex incisional hernia with Flex HD biologic mesh, 20 x 25 cm, Application of negative pressure dressing Advance to soft diet             Add Oxy IR, robaxin, decrease dilaudid Mobilize - up to chair today, ambulate with assist - PT following Complex VAC dressing per WOC nurse MWF             Staples out POD #14 Pneumonia CT scan reviewed             On Cefepime for HCAP Pulmonary toilet, mobilize Abx per medical service Anxiety, pain  control Quite anxious - on ativan Convert from IV to PO pain meds, add robaxin  Likely needs to remain in stepdown for now due to immobility and requirements for nursing care.Work towards Engineering geologist.  Hopefully with orals she'll have better pain control and be able to mobilize more.  Try to d/c foley.  Placed OT consult, may need SNF at discharge.    LOS: 9 days    Nonie Hoyer 01/23/2016, 7:32 AM Pager: [phone]

## 2016-01-23 NOTE — Progress Notes (Signed)
Date: January 23, 2016 Chart reviewed for concurrent status and case management needs. Will continue to follow patient for changes and needs: wound vac changed q Monday,wednesday and Friday/ Marcelle Smiling, BSN, CCM, RN,   [phone]

## 2016-01-23 NOTE — Progress Notes (Signed)
Up to chair with assistance of standard walker and standby 2. Tolerated fairly well with c/o "can't breathe," sats down to 86% with activity however recovers quickly after sitting down. Encouraged use of IS which she was able to get to 700 max x 2 then 500 after that. Use of flutter valve also- keeping both at hands reach with encouragement to use. Every activity requires much encouragement to get patient initiated. Foley d/c'ed - will use BSC from this point on. Okay with surgery to transfer to med surg today - will check with [facility].

## 2016-01-23 NOTE — Progress Notes (Signed)
Patient ID: Valerie [name], female   DOB: 03/25/1946, 70 y.o.   MRN: [id number] [facility] PROGRESS NOTE  Valerie [name] WUJ:[id number] DOB: 08-05-1946 DOA: 01/14/2016 PCP: Garth Schlatter, MD  Brief narrative:    70 y.o. female with past medical history significant for overactive bladder, chronic UTIs, necrotizing pancreatitis status post partial pancreatectomy, morbid obesity, type 2 diabetes mellitus, just recently discharged after being hospitalized for small bowel obstruction. Patient presented to [facility] 01/14/2016 with worsening nausea, vomiting and abdominal discomfort for past day prior to this admission. CT abdomen and pelvis on the admission demonstrated small bowel obstruction with proximal ileum entrapment in the ventral hernia.  Patient is status post exploratory laparotomy, LOA, extensive debridement of skin and sq tissue and muscle, repair of incarcerated incisional hernia with biologic mesh, VAC application by Dr. Derrell Lolling 01/15/2016 for Incarcerated incisional hernias, multiple, with small bowel obstruction ischemic fat necrosis of skin and subcutaneous tissue and fascia. She has midline VAC placed and NG tube, managed by surgery.  Major events since admission: 1/18 - ex lap by Dr. Derrell Lolling, SDU post op 1/21 - improving slowly, NGT in place  1/23 - allowing sips  1/24 - NGT out, advancing diet slowly  1/25 - CT chest with developing HCAP vs aspiration, multifocal PNA, added bread spectrum ABX   Assessment/Plan:    Principal problem: Exploratory laparotomy / repair of incarcerated incisional hernia / Leukocytosis  - CT abdomen and pelvis on the admission demonstrated small bowel obstruction with proximal ileum entrapment in the ventral hernia - Status post exploratory laparotomy, LOA, extensive debridement of skin and sq tissue and muscle, repair of incarcerated incisional hernia with biologic mesh, VAC application by Dr. Derrell Lolling 01/15/2016 for incarcerated  incisional hernias, multiple, with small bowel obstruction ischemic fat necrosis of skin and subcutaneous tissue and fascia.  - post op day #8, pt clinically improving, + flatus and small BM's  - Continue VAC dressing changes MWF, appreciate wound care team assistance  - Continue supportive care with analgesia and antiemetics as needed, bowel regimen  - minimize narcotic as possible   Principal problem: ? A-fib - reported by nursing staff - please note that this was reviewed and discussed with cardiology on call, no A-FIB - repeat EKG with NSR  Urinary tract infection, yeast  - Has had Klebsiella UTI during recent hospitalization. Urine culture on this admission growing yeast - continue Fluconzole day #7/7 (started 1/20)  Acute dyspnea - 1/23 CXR with pulmonary vascular congestion and possibly developing multifocal PNA vs HCAP and aspiration  - weight up from 278 --> 292 --> 286 lbs after dose of Lasix 40 mg IV given on 1/23 --> 279 (after 2nd dose of Lasix 1/25) - monitor volume status closely  - broadened the ABX coverage for HCAP and aspiration vanc and cefepime 1/25, today is day #2 of ABX  - place on lasix daily until volume status stabilizes 20 mg IV QD (1/26)  Acute renal failure superimposed on chronic kidney disease stage 4 - Baseline creatinine is 1.34 and on this admission acutely elevated at 2.15 likely in the setting of acute illness - Creatinine remains WNL, no further IVF to avoid volume overload  - BMP in AM  Hypomagnesemia  - low this AM 1.7 , will give additional 2 gm Mg IV - repeat Mg level in AM  Anemia of chronic disease / Acute post operative blood loss anemia  - Due to combination of CKD and postoperative blood loss anemia  -  Hemoglobin 8.4 today, hold off on transfusion for now and transfuse if Hg < 7 - Check CBC in am  Dyslipidemia associated with diabetes mellitus type 2 - Resume home meds Lipitor 10 mg at bedtime, omega 3 and fenofibrate when able to  tolerate solids   Essential hypertension, benign - Hydralazine 10 mg IV every 4 hours as needed for BP above 150/90  - for now, BP has been reasonably stable   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 - Has NG tube, allowing sips today   DVT Prophylaxis  - Heparin subQ while pt in hospital   Code Status: Full.  Family Communication:  plan of care discussed with the patient and family at bedside  Disposition Plan: Remains in SDU, home once tolerating diet   IV access:  Peripheral IV  Procedures and diagnostic studies:    Exploratory laparotomy, LOA, extensive debridement of skin and sq tissue and muscle, repair of incarcerated incisional hernia with biologic mesh, VAC application---Dr. Derrell Lolling 01/15/2016 for Incarcerated incisional hernias, multiple, with small bowel obstruction Ischemic fat necrosis of skin and subcutaneous tissue and fascia.  Ct Abdomen Pelvis Wo Contrast 01/14/2016  1. Small bowel obstruction related to entrapment of proximal ileum in a small ventral hernia. Proximal small bowel is dilated with multiple air-fluid levels measuring up to 5.6 cm in diameter. Distal small bowel is completely decompressed, and colon is largely decompressed as well. Emergent surgical consultation is strongly recommended. 2. Extensive airspace disease in the lung bases bilaterally (left greater than right), concerning for potential aspiration pneumonia. 3. Cholelithiasis without evidence of acute cholecystitis at this time. 4. Chronic mild right hydronephrosis with urothelial thickening in the right renal collecting system, poorly evaluated on today's noncontrast CT examination, but similar in retrospect to prior studies. Nonemergent Urology  consultation is recommended for further evaluation. 5. Atherosclerosis, including right coronary artery disease. 6. There are calcifications of the mitral valve and mitral annulus. Echocardiographic correlation for evaluation of potential valvular dysfunction may be warranted if clinically indicated. 7. Additional incidental findings, as above. Electronically Signed   By: Trudie Reed M.D.   On: 01/14/2016 22:13   Dg Chest 1 View 01/20/2016  1. Cardiomegaly with mild pulmonary vascular congestion. 2. There is a 2.7 cm nodular opacity in the right midlung most concerning for pneumonia.   Ct Chest Wo Contrast 01/21/2016  Multifocal airspace process worse over the right upper lobe and left base likely multifocal pneumonia and likely secondary to aspiration with small amount of aspirate material noted within the left posterior aspect of the mid to distal trachea. Small left pleural effusion. Minimal reactive mediastinal adenopathy. Stable cardiomegaly with severe calcification of the mitral valve annulus. 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   Medical Consultants:  Surgery, Dr. Claud Kelp  Other Consultants:  DM coordinator PT  IAnti-Infectives:   Vanco ans zosyn 01/15/2016 --> 1/20 Rocephin 1/20 --> 1/25 Vanc resumed 1/25 for HCAP coverage -->  Maxipime 1/25 for HCAP coverage -->  Debbora Presto, MD [facility] Pager [phone]  If 7PM-7AM, please contact night-coverage [email or web address] Password [facility] 01/23/2016, 12:51 PM   LOS: 9 days   HPI/Subjective: No acute overnight events. Patient reports feeling better, anxious   Objective: Filed  Vitals:   01/23/16 0800 01/23/16 0900 01/23/16 1135 01/23/16 1200  BP: 160/82  148/62   Pulse: 91  87   Temp: 98.3 F (36.8 C)   97.8 F (36.6 C)  TempSrc: Oral   Oral  Resp: 25 29 33   Height:      Weight:      SpO2: 97%  99%     Intake/Output Summary (Last 24  hours) at 01/23/16 1251 Last data filed at 01/23/16 1154  Gross per 24 hour  Intake   1670 ml  Output   2155 ml  Net   -485 ml    Exam:   General:  Pt is not in acute distress, sitting in chair   Cardiovascular: Rate controlled, appreciate S1, S2   Respiratory: Crackles at bases with scattered rhonchi   Abdomen: VAC in place, NGT in place, tender in mid abdomen with voluntary guarding  Extremities: pulses palpable, + 1 bilateral LE edema   Neuro: Nonfocal  Data Reviewed: Basic Metabolic Panel:  Recent Labs Lab 01/19/16 0408 01/20/16 0337 01/21/16 0340 01/22/16 0330 01/23/16 0350  NA 142 141 144 140 138  K 4.4 4.4 4.0 3.6 3.6  CL 108 107 106 103 100*  CO2 21* 21* GLUCOSE 233* 242* 247* 212* 323*  BUN 25* 21* CREATININE 0.73 0.63 0.59 0.52 0.71  CALCIUM 8.9 8.6* 8.8* 8.4* 8.5*  MG 1.6* 1.7 1.7 1.6* 1.7   CBC:  Recent Labs Lab 01/19/16 0408 01/20/16 0337 01/21/16 0340 01/22/16 0330 01/23/16 0350  WBC 6.6 6.3 4.9 4.9 4.4  HGB 8.0* 7.7* 8.1* 8.2* 8.4*  HCT 26.5* 26.1* 27.1* 26.9* 28.1*  MCV 94.6 91.9 93.4 92.8 92.4  PLT 254 222 258 249 233   CBG:  Recent Labs Lab 01/22/16 1152 01/22/16 1637 01/22/16 2234 01/23/16 0755 01/23/16 1127  GLUCAP 174* 274* 267* 292* 329*    Recent Results (from the past 240 hour(s))  Culture, blood (routine x 2)     Status: None (Preliminary result)   Collection Time: 01/15/16  1:40 AM  Result Value Ref Range Status   Specimen Description BLOOD RIGHT HAND  Final   Special Requests BOTTLES DRAWN AEROBIC ONLY 5CC  Final   Culture   Final    NO GROWTH 3 DAYS Performed at [facility]    Report Status PENDING  Incomplete  Culture, blood (routine x 2)     Status: None (Preliminary result)   Collection Time: 01/15/16  1:49 AM  Result Value Ref Range Status   Specimen Description BLOOD LEFT HAND  Final   Special Requests BOTTLES DRAWN AEROBIC ONLY 5CC  Final   Culture   Final    NO GROWTH  3 DAYS Performed at [facility]    Report Status PENDING  Incomplete  Urine culture     Status: None   Collection Time: 01/15/16 10:21 AM  Result Value Ref Range Status   Specimen Description URINE, CLEAN CATCH  Final   Special Requests NONE  Final   Culture   Final    50,000 COLONIES/mL YEAST Performed at [facility]    Report Status 01/16/2016 FINAL  Final  Surgical pcr screen     Status: None   Collection Time: 01/15/16 10:21 AM  Result Value Ref Range Status   MRSA, PCR NEGATIVE NEGATIVE Final   Staphylococcus aureus NEGATIVE NEGATIVE Final    Scheduled meds:  . antiseptic oral rinse  7  mL Mouth Rinse q12n4p  . ceFEPime (MAXIPIME) IV  2 g Intravenous 3 times per day  . chlorhexidine  15 mL Mouth Rinse BID  . fluconazole (DIFLUCAN) IV  100 mg Intravenous Q24H  . fluticasone  2 spray Each Nare Daily  . heparin  5,000 Units Subcutaneous 3 times per day  . insulin aspart  0-20 Units Subcutaneous TID WC  . insulin glargine  20 Units Subcutaneous QHS  . methocarbamol  1,000 mg Oral TID  . pantoprazole  40 mg Oral Daily  . polyethylene glycol  17 g Oral Daily  . senna-docusate  1 tablet Oral BID  . vancomycin  1,250 mg Intravenous Q12H

## 2016-01-23 NOTE — Progress Notes (Signed)
Inpatient Diabetes Program Recommendations  AACE/ADA: New Consensus Statement on Inpatient Glycemic Control (2015)  Target Ranges:  Prepandial:   less than 140 mg/dL      Peak postprandial:   less than 180 mg/dL (1-2 hours)      Critically ill patients:  140 - 180 mg/dL   Review of Glycemic Control  Results for DORETTA, REMMERT (MRN [id number]) as of 01/23/2016 14:40  Ref. Range 01/22/2016 16:37 01/22/2016 22:34 01/23/2016 07:55 01/23/2016 11:27  Glucose-Capillary Latest Ref Range: 65-99 mg/dL 409 (H) 811 (H) 914 (H) 329 (H)   Increase Lantus to 30 units QHS. Add HS correction Add Novolog 6 units tidwc for meal coverage insulin if pt eats > 50% meal Change diet to CHO mod med.  Will continue to follow. Thank you. Ailene Ards, RD, LDN, CDE Inpatient Diabetes Coordinator [phone]

## 2016-01-23 NOTE — Progress Notes (Signed)
Initial Nutrition Assessment  DOCUMENTATION CODES:   Morbid obesity  INTERVENTION:  -RD will order snacks to encourage pt to eat -Continue to monitor for diet advancement, needs   NUTRITION DIAGNOSIS:   Inadequate oral intake related to nausea, vomiting, poor appetite as evidenced by per patient/family report.  GOAL:   Patient will meet greater than or equal to 90% of their needs  MONITOR:   PO intake, Diet advancement, Labs, I & O's, Skin, Weight trends  REASON FOR ASSESSMENT:   Malnutrition Screening Tool    ASSESSMENT:   Valerie [name] is a 70 y.o. female with history of necrotizing pancreatitis, diabetes mellitus, hypertension who was discharged yesterday presents with complaints of nausea vomiting and abdominal discomfort. Patient has had multiple episodes of nausea and vomiting  Spoke with pt at bedside. Pt complains of poor appetite at this point in time. She was discharged on 1/17, before presenting back here the following day with nausea/vomitting/abd discomfort.  She is s/p exploratory laparotomy, LOA, debridgement of skin, repair of incarcerated incisional hernia and VAC application. Currently, they have removed her NGT, but it is possible she is developing HCAP vs aspiration PNA. Pt is now on broad spectrum ABX.  Currently she is on full liquid diet. She is not really hungry, but said she will eat when she is ready. She is having BM's and flatus.  Nutrition-Focused physical exam completed. Findings are no fat depletion, no muscle depletion, and no edema.   Labs reviewed. Anti-Hypertensives: Apresoline PRN, Lopressor, PRN   Diet Order:  DIET SOFT Room service appropriate?: Yes; Fluid consistency:: Thin  Skin:  Wound (see comment) (Diabetic Ulcers to leg, foot, wound to abdomen.)  Last BM:  1/26  Height:   Ht Readings from Last 1 Encounters:  01/15/16  (1.626 m)    Weight:   Wt Readings from Last 1 Encounters:  01/23/16 279 lb 15.8 oz (127 kg)     Ideal Body Weight:  54.54 kg  BMI:  Body mass index is 48.04 kg/(m^2).  Estimated Nutritional Needs:   Kcal:  1400-1600  Protein:  70-85 grams  Fluid:  >/= 2L  EDUCATION NEEDS:   No education needs identified at this time  Dionne Ano. Thornton Dohrmann, MS, RD LDN After Hours/Weekend Pager [phone]

## 2016-01-24 LAB — CBC
HCT: 27.7 % — ABNORMAL LOW (ref 36.0–46.0)
HEMOGLOBIN: 8.3 g/dL — AB (ref 12.0–15.0)
MCH: 27.6 pg (ref 26.0–34.0)
MCHC: 30 g/dL (ref 30.0–36.0)
MCV: 92 fL (ref 78.0–100.0)
Platelets: 226 10*3/uL (ref 150–400)
RBC: 3.01 MIL/uL — ABNORMAL LOW (ref 3.87–5.11)
RDW: 14.5 % (ref 11.5–15.5)
WBC: 5.2 10*3/uL (ref 4.0–10.5)

## 2016-01-24 LAB — BASIC METABOLIC PANEL
Anion gap: 9 (ref 5–15)
BUN: 10 mg/dL (ref 6–20)
CHLORIDE: 101 mmol/L (ref 101–111)
CO2: 27 mmol/L (ref 22–32)
CREATININE: 0.58 mg/dL (ref 0.44–1.00)
Calcium: 8.3 mg/dL — ABNORMAL LOW (ref 8.9–10.3)
GFR calc Af Amer: >60 mL/min (ref >60)
GFR calc non Af Amer: >60 mL/min (ref >60)
GLUCOSE: 215 mg/dL — AB (ref 65–99)
POTASSIUM: 3.4 mmol/L — AB (ref 3.5–5.1)
Sodium: 137 mmol/L (ref 135–145)

## 2016-01-24 LAB — GLUCOSE, CAPILLARY
GLUCOSE-CAPILLARY: 162 mg/dL — AB (ref 65–99)
GLUCOSE-CAPILLARY: 154 mg/dL — AB (ref 65–99)
Glucose-Capillary: 201 mg/dL — ABNORMAL HIGH (ref 65–99)
Glucose-Capillary: 138 mg/dL — ABNORMAL HIGH (ref 65–99)

## 2016-01-24 LAB — MAGNESIUM: Magnesium: 1.8 mg/dL (ref 1.7–2.4)

## 2016-01-24 MED ORDER — POTASSIUM CHLORIDE CRYS ER 20 MEQ PO TBCR
40.0000 meq | EXTENDED_RELEASE_TABLET | Freq: Once | ORAL | Status: AC
  Administered 2016-01-24: 40 meq via ORAL
  Filled 2016-01-24: qty 2

## 2016-01-24 NOTE — Evaluation (Signed)
Occupational Therapy Evaluation Patient Details Name: Valerie [name] MRN: [id number] DOB: 04-08-1946 Today's Date: 01/24/2016    History of Present Illness patient is 70 yo female admitted 01/14/16 is status post exploratory laparotomy, extensive debridement of skin and sq tissue and muscle, repair of incarcerated incisional hernia with biologic mesh, VAC application by Dr. Derrell Lolling 01/15/2016 for Incarcerated incisional hernias, multiple, with small bowel obstruction ischemic fat necrosis of skin and subcutaneous tissue and fascia. She has midline VAC placed and NG tube, managed by surgery.   Clinical Impression   Pt admitted with above. She demonstrates the below listed deficits and will benefit from continued OT to maximize safety and independence with BADLs.  Pt presents to OT with generalized weakness, obesity, pain.  She requires max A - total A overall for ADLs.  She lives with spouse and son, but doubt they can provide necessary level of assistance at discharge.  Recommend SNF level rehab.       Follow Up Recommendations  SNF    Equipment Recommendations  None recommended by OT    Recommendations for Other Services       Precautions / Restrictions Precautions Precautions: Fall Precaution Comments: , VAC, incontinent, on lasix      Mobility Bed Mobility Overal bed mobility: Needs Assistance Bed Mobility: Rolling Rolling: Max assist         General bed mobility comments: Mod A to roll to Lt and max A to roll to Rt.  requires encouragement for full participation   Transfers                 General transfer comment: Pt refused OOB activity citing fatigue     Balance                                            ADL Overall ADL's : Needs assistance/impaired Eating/Feeding: Modified independent;Bed level   Grooming: Wash/dry hands;Oral care;Wash/dry face;Set up;Bed level   Upper Body Bathing: Moderate assistance;Bed level   Lower Body  Bathing: Total assistance;Bed level   Upper Body Dressing : Maximal assistance;Bed level   Lower Body Dressing: Total assistance;Bed level   Toilet Transfer: Total assistance Toilet Transfer Details (indicate cue type and reason): Pt refused OOB activity due to fatigue  Toileting- Clothing Manipulation and Hygiene: Total assistance;Bed level Toileting - Clothing Manipulation Details (indicate cue type and reason): Pt incontinent of urine.  Assisted with clean up      Functional mobility during ADLs: Total assistance       Vision     Perception     Praxis      Pertinent Vitals/Pain Pain Assessment: Faces Faces Pain Scale: Hurts little more Pain Location: peri area and abdomen Pain Descriptors / Indicators: Crying;Grimacing Pain Intervention(s): Repositioned     Hand Dominance Right   Extremity/Trunk Assessment Upper Extremity Assessment Upper Extremity Assessment: Generalized weakness   Lower Extremity Assessment Lower Extremity Assessment: Defer to PT evaluation       Communication Communication Communication: No difficulties   Cognition Arousal/Alertness: Awake/alert Behavior During Therapy: Anxious Overall Cognitive Status: No family/caregiver present to determine baseline cognitive functioning (conversation difficult to follow )                     General Comments       Exercises Exercises: General Upper Extremity     Shoulder Instructions  Home Living Family/patient expects to be discharged to:: Skilled nursing facility Living Arrangements: Spouse/significant other;Children Available Help at Discharge: Family;Available 24 hours/day Type of Home: House Home Access: Stairs to enter [employer] of Steps: 1   Home Layout: Multi-level Alternate Level Stairs-Number of Steps: 6 to get up bedroom 10 down to family room Alternate Level Stairs-Rails: Right           Home Equipment: Walker - 4 wheels;Shower seat - built in           Prior Functioning/Environment Level of Independence: Independent;Independent with assistive device(s)        Comments: Will take rollator for shopping and longer distances in case she has to sit    OT Diagnosis: Generalized weakness;Acute pain   OT Problem List: Decreased strength;Decreased activity tolerance;Impaired balance (sitting and/or standing);Decreased safety awareness;Decreased knowledge of use of DME or AE;Obesity;Pain   OT Treatment/Interventions: Self-care/ADL training;Therapeutic exercise;Energy conservation;DME and/or AE instruction;Therapeutic activities;Patient/family education;Balance training    OT Goals(Current goals can be found in the care plan section) Acute Rehab OT Goals Patient Stated Goal: did not state  OT Goal Formulation: With patient Time For Goal Achievement: 02/07/16 Potential to Achieve Goals: Good ADL Goals Pt Will Perform Grooming: with min assist;sitting Pt Will Perform Upper Body Bathing: with min assist;sitting Pt Will Transfer to Toilet: with min assist;stand pivot transfer;bedside commode Pt Will Perform Toileting - Clothing Manipulation and hygiene: with mod assist;sit to/from stand  OT Frequency: Min 2X/week   Barriers to D/C: Decreased caregiver support  unsure family can provide necessary level of assistance        Co-evaluation              End of Session Nurse Communication: Mobility status  Activity Tolerance: Patient limited by fatigue Patient left: in bed;with call bell/phone within reach;with bed alarm set   Time: 7829-5621 OT Time Calculation (min): 17 min Charges:  OT General Charges $OT Visit: 1 Procedure OT Evaluation $OT Eval Moderate Complexity: 1 Procedure G-Codes:    Jeani Hawking M 01/30/16, 12:47 PM

## 2016-01-24 NOTE — Progress Notes (Signed)
Pharmacy Antibiotic Follow-up Note  Valerie [name] is a 70 y.o. year-old female admitted on 01/14/2016.  The patient is currently on day #3 of vancomycin and cefepime for suspected PNA.  Assessment/Plan: - continue vancomycin 1250 mg IV q12h and cefepime 2gm IV q8h - please indicate plan for abx.  Pharmacy will plan on checking vancomycin level on 01/25/16 if to continue with vancomycin.   Temp (24hrs), Avg:97.8 F (36.6 C), Min:97.2 F (36.2 C), Max:98.3 F (36.8 C)   Recent Labs Lab 01/20/16 0337 01/21/16 0340 01/22/16 0330 01/23/16 0350 01/24/16 0520  WBC 6.3 4.9 4.9 4.4 5.2    Recent Labs Lab 01/20/16 0337 01/21/16 0340 01/22/16 0330 01/23/16 0350 01/24/16 0520  CREATININE 0.63 0.59 0.52 0.71 0.58   Estimated Creatinine Clearance: 85.1 mL/min (by C-G formula based on Cr of 0.58).    No Known Allergies  Antimicrobials this admission: 1/18 >> Zosyn >> 1/20  1/18 >> Vanc >> 1/20, 1/25 >> Vanc >> 1/20 >> Fluconazole >> 1/26 1/20 >> Ceftriaxone >> 1/21 1/25 >> Cefepime>>   Microbiology results: Past urine cx from 1/8 = Klebsiella, (R to ampicillin, I to nitrofurantoin, S to rest) 1/18 blood x 2: NGF 1/18 urine: 50,000 colonies of yeast  1/18 MRSA PCR: neg 1/25 Strep pneumo antigen: neg   Thank you for allowing pharmacy to be a part of this patient's care.  Lucia Gaskins PharmD 01/24/2016 9:20 AM

## 2016-01-24 NOTE — Care Management Note (Signed)
Case Management Note  Patient Details  Name: Valerie [name] MRN: [id number] Date of Birth: 10/17/1946  Subjective/Objective: [facility] active w/[facility]/PT/OT/aide rep Clydie Braun aware & following. Noted patient has wound vac. If home w/wound vac will need home wound vac orders([facility] or [facility]). PT- recc SNF, declines SNF wants home w/HHC.Await HHC orders.                  Action/Plan:d/c home w/HHC.   Expected Discharge Date:                  Expected Discharge Plan:  Home w Home Health Services  In-House Referral:  NA  Discharge planning Services  CM Consult  Post Acute Care Choice:    Choice offered to:     DME Arranged:    DME Agency:     HH Arranged:  RN, OT, Nurse's Aide, PT HH Agency:  [facility]  Status of Service:  In process, will continue to follow  Medicare Important Message Given:  Yes Date Medicare IM Given:    Medicare IM give by:    Date Additional Medicare IM Given:    Additional Medicare Important Message give by:     If discussed at Long Length of Stay Meetings, dates discussed:    Additional Comments:  Lanier Clam, RN 01/24/2016, 4:23 PM

## 2016-01-24 NOTE — Progress Notes (Signed)
CSW spoke with patient re: discharge planning. Patient has adamantly declined SNF - states that she will be returning home with her husband & son at discharge. RNCM, Kathy aware.   No further CSW needs identified - CSW signing off.   Lincoln Maxin, LCSW [facility] Clinical Social Worker cell #: [phone]

## 2016-01-24 NOTE — Progress Notes (Addendum)
Patient ID: Valerie [name], female   DOB: February 07, 1946, 70 y.o.   MRN: [id number] [facility] PROGRESS NOTE  Valerie [name] QIO:[id number] DOB: [birth year] DOA: 01/14/2016 PCP: Garth Schlatter, MD  Brief narrative:    70 y.o. female with past medical history significant for overactive bladder, chronic UTIs, necrotizing pancreatitis status post partial pancreatectomy, morbid obesity, type 2 diabetes mellitus, just recently discharged after being hospitalized for small bowel obstruction. Patient presented to [facility] 01/14/2016 with worsening nausea, vomiting and abdominal discomfort for past day prior to this admission. CT abdomen and pelvis on the admission demonstrated small bowel obstruction with proximal ileum entrapment in the ventral hernia.  Patient is status post exploratory laparotomy, LOA, extensive debridement of skin and sq tissue and muscle, repair of incarcerated incisional hernia with biologic mesh, VAC application by Dr. Derrell Lolling 01/15/2016 for Incarcerated incisional hernias, multiple, with small bowel obstruction ischemic fat necrosis of skin and subcutaneous tissue and fascia. She has midline VAC placed and NG tube, managed by surgery.  Major events since admission: 1/18 - ex lap by Dr. Derrell Lolling, SDU post op 1/21 - improving slowly, NGT in place  1/23 - allowing sips  1/24 - NGT out, advancing diet slowly  1/25 - CT chest with developing HCAP vs aspiration, multifocal PNA, added bread spectrum ABX  1/27 - no events on tele, d/c tele   Assessment/Plan:    Principal problem: Exploratory laparotomy / repair of incarcerated incisional hernia / Leukocytosis  - CT abdomen and pelvis on the admission demonstrated small bowel obstruction with proximal ileum entrapment in the ventral hernia - Status post exploratory laparotomy, LOA, extensive debridement of skin and sq tissue and muscle, repair of incarcerated incisional hernia with biologic mesh, VAC application by Dr.  Derrell Lolling 01/15/2016 for incarcerated incisional hernias, multiple, with small bowel obstruction ischemic fat necrosis of skin and subcutaneous tissue and fascia.  - post op day #9, pt clinically improving, + flatus and small BM's  - Continue VAC dressing changes MWF, appreciate wound care team assistance  - Continue supportive care with analgesia and antiemetics as needed, bowel regimen  - minimize narcotic as possible, OOB as pt able to tolerate   Principal problem: ? A-fib - reported by nursing staff - please note that this was reviewed and discussed with cardiology on call, no A-FIB - repeat EKG with NSR - d/c tele 1/27, pt also wants it off   Urinary tract infection, yeast  - Has had Klebsiella UTI during recent hospitalization. Urine culture on this admission growing yeast - completed course of Fluconazole   Acute dyspnea - 1/23 CXR with pulmonary vascular congestion and possibly developing multifocal PNA vs HCAP and aspiration  - weight up from 278 --> 292 --> 286 lbs after dose of Lasix 40 mg IV given on 1/23 --> 279 (after 2nd dose of Lasix 1/25), remains stable at 279 lbs in the past 24 hours  - monitor volume status closely  - broadened the ABX coverage for HCAP and aspiration vanc and cefepime 1/25, today is day #3 of ABX, transition to oral ABX in am 1/28 - placed on lasix daily until volume status stabilizes 20 mg IV QD (1/26) - pt still needs HOB elevation to help with dyspnea   Acute renal failure superimposed on chronic kidney disease stage 4 - Baseline creatinine is 1.34 and on this admission acutely elevated at 2.15 likely in the setting of acute illness - Creatinine remains WNL, no further IVF to avoid volume overload  -  BMP in AM  Hypomagnesemia, hypokalemia - Mg WNL this AM - supplement K - repeat Mg level in AM  Anemia of chronic disease / Acute post operative blood loss anemia  - Due to combination of CKD and postoperative blood loss anemia  - Hemoglobin 8.3  today, hold off on transfusion for now and transfuse if Hg < 7 - Check CBC in am  Dyslipidemia associated with diabetes mellitus type 2 - Resume home meds Lipitor 10 mg at bedtime, omega 3 and fenofibrate when able to tolerate solids   Essential hypertension, benign - Hydralazine 10 mg IV every 4 hours as needed for BP above 150/90  - for now, BP has been reasonably stable   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  DVT Prophylaxis  - Heparin subQ while pt in hospital   Code Status: Full.  Family Communication:  plan of care discussed with the patient and family at bedside  Disposition Plan: Continues to improve, not ready for discharge at this time, surgery will clear when pt ready, anticipate early next week, will go to SNF   IV access:  Peripheral IV  Procedures and diagnostic studies:    Exploratory laparotomy, LOA, extensive debridement of skin and sq tissue and muscle, repair of incarcerated incisional hernia with biologic mesh, VAC application---Dr. Derrell Lolling 01/15/2016 for Incarcerated incisional hernias, multiple, with small bowel obstruction Ischemic fat necrosis of skin and subcutaneous tissue and fascia.  Ct Abdomen Pelvis Wo Contrast 01/14/2016  1. Small bowel obstruction related to entrapment of proximal ileum in a small ventral hernia. Proximal small bowel is dilated with multiple air-fluid levels measuring up to 5.6 cm in diameter. Distal small bowel is completely decompressed, and colon is largely decompressed as well. Emergent surgical consultation is strongly recommended. 2. Extensive airspace disease in the lung bases bilaterally (left greater than right), concerning for potential aspiration pneumonia. 3. Cholelithiasis  without evidence of acute cholecystitis at this time. 4. Chronic mild right hydronephrosis with urothelial thickening in the right renal collecting system, poorly evaluated on today's noncontrast CT examination, but similar in retrospect to prior studies. Nonemergent Urology consultation is recommended for further evaluation. 5. Atherosclerosis, including right coronary artery disease. 6. There are calcifications of the mitral valve and mitral annulus. Echocardiographic correlation for evaluation of potential valvular dysfunction may be warranted if clinically indicated. 7. Additional incidental findings, as above. Electronically Signed   By: Trudie Reed M.D.   On: 01/14/2016 22:13   Dg Chest 1 View 01/20/2016  1. Cardiomegaly with mild pulmonary vascular congestion. 2. There is a 2.7 cm nodular opacity in the right midlung most concerning for pneumonia.   Ct Chest Wo Contrast 01/21/2016  Multifocal airspace process worse over the right upper lobe and left base likely multifocal pneumonia and likely secondary to aspiration with small amount of aspirate material noted within the left posterior aspect of the mid to distal trachea. Small left pleural effusion. Minimal reactive mediastinal adenopathy. Stable cardiomegaly with severe calcification of the mitral valve annulus. 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   Medical Consultants:  Surgery, Dr. Claud Kelp  Other Consultants:  DM coordinator PT  IAnti-Infectives:   Vanco ans zosyn 01/15/2016 --> 1/20 Rocephin 1/20 --> 1/25  Vanc resumed 1/25 for HCAP coverage -->  Maxipime 1/25 for HCAP coverage -->  Debbora Presto, MD [facility] Pager [phone]  If 7PM-7AM, please contact night-coverage [email or web address] Password [email or web address] 01/24/2016, 7:00 AM   LOS: 10 days   HPI/Subjective: No acute overnight events. Patient reports feeling better, anxious   Objective: Filed  Vitals:   01/23/16 1600 01/23/16 1707 01/23/16 2140 01/24/16 0554  BP:  143/69 138/59 136/67  Pulse:  89 87 89  Temp: 97.2 F (36.2 C) 97.9 F (36.6 C) 98.3 F (36.8 C) 97.6 F (36.4 C)  TempSrc: Axillary Oral Oral Oral  Resp:  Height:      Weight:      SpO2:  96% 99% 99%    Intake/Output Summary (Last 24 hours) at 01/24/16 0700 Last data filed at 01/24/16 0209  Gross per 24 hour  Intake   1060 ml  Output    205 ml  Net    855 ml    Exam:   General:  Pt is not in acute distress, sitting in chair   Cardiovascular: Rate controlled, appreciate S1, S2   Respiratory: Crackles at bases with scattered rhonchi, diminished breath sounds throughout   Abdomen: VAC in place, NGT in place, tender in mid abdomen with voluntary guarding  Extremities: pulses palpable, + 1 bilateral LE edema   Neuro: Nonfocal  Data Reviewed: Basic Metabolic Panel:  Recent Labs Lab 01/20/16 0337 01/21/16 0340 01/22/16 0330 01/23/16 0350 01/24/16 0520  NA 141 144 140 138 137  K 4.4 4.0 3.6 3.6 3.4*  CL 107 106 103 100* 101  CO2 21* GLUCOSE 242* 247* 212* 323* 215*  BUN 21* CREATININE 0.63 0.59 0.52 0.71 0.58  CALCIUM 8.6* 8.8* 8.4* 8.5* 8.3*  MG 1.7 1.7 1.6* 1.7 1.8   CBC:  Recent Labs Lab 01/20/16 0337 01/21/16 0340 01/22/16 0330 01/23/16 0350 01/24/16 0520  WBC 6.3 4.9 4.9 4.4 5.2  HGB 7.7* 8.1* 8.2* 8.4* 8.3*  HCT 26.1* 27.1* 26.9* 28.1* 27.7*  MCV 91.9 93.4 92.8 92.4 92.0  PLT 222 258 249 233 226   CBG:  Recent Labs Lab 01/23/16 0755 01/23/16 1127 01/23/16 1559 01/23/16 1705 01/23/16 2133  GLUCAP 292* 329* 216* 247* 185*    Recent Results (from the past 240 hour(s))  Culture, blood (routine x 2)     Status: None (Preliminary result)   Collection Time: 01/15/16  1:40 AM  Result Value Ref Range Status   Specimen Description BLOOD RIGHT HAND  Final   Special Requests BOTTLES DRAWN AEROBIC ONLY 5CC  Final   Culture   Final     NO GROWTH 3 DAYS Performed at [facility]    Report Status PENDING  Incomplete  Culture, blood (routine x 2)     Status: None (Preliminary result)   Collection Time: 01/15/16  1:49 AM  Result Value Ref Range Status   Specimen Description BLOOD LEFT HAND  Final   Special Requests BOTTLES DRAWN AEROBIC ONLY 5CC  Final   Culture   Final    NO GROWTH 3 DAYS Performed at [facility]    Report Status PENDING  Incomplete  Urine culture     Status: None   Collection Time: 01/15/16 10:21 AM  Result Value Ref Range Status   Specimen Description URINE, CLEAN CATCH  Final   Special Requests NONE  Final   Culture   Final  50,000 COLONIES/mL YEAST Performed at [facility]    Report Status 01/16/2016 FINAL  Final  Surgical pcr screen     Status: None   Collection Time: 01/15/16 10:21 AM  Result Value Ref Range Status   MRSA, PCR NEGATIVE NEGATIVE Final   Staphylococcus aureus NEGATIVE NEGATIVE Final    Scheduled meds:  . antiseptic oral rinse  7 mL Mouth Rinse q12n4p  . ceFEPime (MAXIPIME) IV  2 g Intravenous 3 times per day  . chlorhexidine  15 mL Mouth Rinse BID  . fluticasone  2 spray Each Nare Daily  . furosemide  20 mg Intravenous Daily  . heparin  5,000 Units Subcutaneous 3 times per day  . insulin aspart  0-20 Units Subcutaneous TID WC  . insulin glargine  20 Units Subcutaneous QHS  . methocarbamol  1,000 mg Oral TID  . pantoprazole  40 mg Oral Daily  . polyethylene glycol  17 g Oral Daily  . senna-docusate  1 tablet Oral BID  . vancomycin  1,250 mg Intravenous Q12H

## 2016-01-24 NOTE — Progress Notes (Signed)
Patient ID: Valerie [name], female   DOB: 08/02/1946, 70 y.o.   MRN: [id number]  General Surgery [facility]  Subjective: Patient on 4 East ward, private room.  Complains of poor appetite.   Abdominal wound dressing being changed this AM.  Objective: Vital signs in last 24 hours: Temp:  [97.2 F (36.2 C)-98.3 F (36.8 C)] 97.6 F (36.4 C) (01/27 0554) Pulse Rate:  [87-89] 89 (01/27 0554) Resp:  [20-33] 20 (01/27 0554) BP: (136-148)/(59-69) 136/67 mmHg (01/27 0554) SpO2:  [96 %-99 %] 99 % (01/27 0554) Last BM Date: 01/24/16  Intake/Output from previous day: 01/26 0701 - 01/27 0700 In: 1310 [P.O.:560; IV Piggyback:750] Out: 655 [Urine:205; Drains:450] Intake/Output this shift:    Physical Exam: HEENT - sclerae clear, mucous membranes moist Neck - soft Chest - shallow bilaterally, few pops and wheezes Cor - RRR Abdomen - soft, obese, BS present; wound inspected and photographed; early granulation tissue throughout; biosynthetic patch centrally intact; no significant drainage, no odor  Lab Results:   Recent Labs  01/23/16 0350 01/24/16 0520  WBC 4.4 5.2  HGB 8.4* 8.3*  HCT 28.1* 27.7*  PLT 233 226   BMET  Recent Labs  01/23/16 0350 01/24/16 0520  NA 138 137  K 3.6 3.4*  CL 100* 101  CO2 25 27  GLUCOSE 323* 215*  BUN 12 10  CREATININE 0.71 0.58  CALCIUM 8.5* 8.3*   PT/INR No results for input(s): LABPROT, INR in the last 72 hours. Comprehensive Metabolic Panel:    Component Value Date/Time   NA 137 01/24/2016 0520   NA 138 01/23/2016 0350   K 3.4* 01/24/2016 0520   K 3.6 01/23/2016 0350   CL 101 01/24/2016 0520   CL 100* 01/23/2016 0350   CO2 27 01/24/2016 0520   CO2 25 01/23/2016 0350   BUN 10 01/24/2016 0520   BUN 12 01/23/2016 0350   CREATININE 0.58 01/24/2016 0520   CREATININE 0.71 01/23/2016 0350   CREATININE 1.27* 03/07/2014 0945   GLUCOSE 215* 01/24/2016 0520   GLUCOSE 323* 01/23/2016 0350   CALCIUM 8.3* 01/24/2016  0520   CALCIUM 8.5* 01/23/2016 0350   AST 16 01/15/2016 0142   AST 19 01/14/2016 2029   ALT 13* 01/15/2016 0142   ALT 13* 01/14/2016 2029   ALKPHOS 61 01/15/2016 0142   ALKPHOS 61 01/14/2016 2029   BILITOT 1.1 01/15/2016 0142   BILITOT 1.0 01/14/2016 2029   PROT 6.5 01/15/2016 0142   PROT 6.8 01/14/2016 2029   ALBUMIN 2.7* 01/15/2016 0142   ALBUMIN 3.0* 01/14/2016 2029    Studies/Results: No results found.  Assessment & Plans: EXPLORATORY LAPAROTOMY, LYSIS OF ADHESIONS, EXTENSIVE DEBRIDEMENT OF SKIN AND SUBCUTANEOUS TISSUE AND MUSCLE, REPAIR OF INCARCERATED INCISION HERNIA WITH BIOLOGIC MESH, APPLICATION OF NEGATIVE PRESSURE DRESSING Soft diet Mobilize - up to chair, ambulate with assist - PT following VAC dressing per WOC nurse Pneumonia Pulmonary toilet, mobilize Abx per medical service Anxiety, pain control  Improved on current regimen Disposition  Consider Rehab consult vs SNF placement for rehab  Will follow with you.  Velora Heckler, MD, [facility] Office: [phone]   Ichiro Chesnut Judie Petit 01/24/2016

## 2016-01-24 NOTE — Care Management Important Message (Signed)
Important Message  Patient Details  Name: Valerie [name] MRN: [id number] Date of Birth: 07/10/1946   Medicare Important Message Given:  Yes    Haskell Flirt 01/24/2016, 11:36 AMImportant Message  Patient Details  Name: Valerie [name] MRN: [id number] Date of Birth: 17-Sep-1946   Medicare Important Message Given:  Yes    Haskell Flirt 01/24/2016, 11:36 AM

## 2016-01-24 NOTE — Consult Note (Signed)
WOC wound follow up Wound type:Open abdominal wound Measurement:per Wednesday Wound bed:red, moist with 5 x 7 cm area of exposed mesh in center Drainage (amount, consistency, odor)  Periwound:intact Dressing procedure/placement/frequency: Replacement of NPWT dressing following assessment of wound.  4 pieces of black foam and 2 pieces of silicone dressing (mepitel) placed in wound bed.  Drape applied and system attached to continuous suction with an immediate seal. Bedside RN and student nurse in attendance. Patient medicated and drowsy, awakens and is appreciative for care. Next dressing change due on Monday, 01/27/16. WOC nursing team will follow, and will remain available to this patient, the nursing, surgical and medical teams.   Thanks, Ladona Mow, MSN, RN, GNP, [facility]  Pager# [phone]

## 2016-01-25 DIAGNOSIS — N179 Acute kidney failure, unspecified: Secondary | ICD-10-CM

## 2016-01-25 DIAGNOSIS — N183 Chronic kidney disease, stage 3 (moderate): Secondary | ICD-10-CM

## 2016-01-25 DIAGNOSIS — K5669 Other intestinal obstruction: Secondary | ICD-10-CM

## 2016-01-25 DIAGNOSIS — D62 Acute posthemorrhagic anemia: Secondary | ICD-10-CM

## 2016-01-25 DIAGNOSIS — E785 Hyperlipidemia, unspecified: Secondary | ICD-10-CM

## 2016-01-25 DIAGNOSIS — E1169 Type 2 diabetes mellitus with other specified complication: Secondary | ICD-10-CM

## 2016-01-25 LAB — GLUCOSE, CAPILLARY
GLUCOSE-CAPILLARY: 258 mg/dL — AB (ref 65–99)
GLUCOSE-CAPILLARY: 211 mg/dL — AB (ref 65–99)
Glucose-Capillary: 291 mg/dL — ABNORMAL HIGH (ref 65–99)

## 2016-01-25 LAB — CBC
HEMATOCRIT: 29.2 % — AB (ref 36.0–46.0)
HEMOGLOBIN: 8.6 g/dL — AB (ref 12.0–15.0)
MCH: 27.7 pg (ref 26.0–34.0)
MCHC: 29.5 g/dL — ABNORMAL LOW (ref 30.0–36.0)
MCV: 94.2 fL (ref 78.0–100.0)
Platelets: 248 10*3/uL (ref 150–400)
RBC: 3.1 MIL/uL — AB (ref 3.87–5.11)
RDW: 14.8 % (ref 11.5–15.5)
WBC: 4.4 10*3/uL (ref 4.0–10.5)

## 2016-01-25 LAB — BASIC METABOLIC PANEL
ANION GAP: 11 (ref 5–15)
BUN: 11 mg/dL (ref 6–20)
CALCIUM: 8.9 mg/dL (ref 8.9–10.3)
CHLORIDE: 102 mmol/L (ref 101–111)
CO2: 29 mmol/L (ref 22–32)
Creatinine, Ser: 0.85 mg/dL (ref 0.44–1.00)
GFR calc non Af Amer: >60 mL/min (ref >60)
GLUCOSE: 301 mg/dL — AB (ref 65–99)
POTASSIUM: 4.1 mmol/L (ref 3.5–5.1)
Sodium: 142 mmol/L (ref 135–145)

## 2016-01-25 LAB — MAGNESIUM: Magnesium: 1.7 mg/dL (ref 1.7–2.4)

## 2016-01-25 NOTE — Progress Notes (Signed)
Patient ID: Valerie [name], female   DOB: 1946/12/19, 70 y.o.   MRN: [id number] [facility] PROGRESS NOTE  Valerie [name] NWG:[id number] DOB: [birth year] DOA: 01/14/2016 PCP: Valerie Schlatter, MD  Brief narrative:    70 y.o. female with past medical history significant for overactive bladder, chronic UTIs, necrotizing pancreatitis status post partial pancreatectomy, morbid obesity, type 2 diabetes mellitus, just recently discharged after being hospitalized for small bowel obstruction. Patient presented to [facility] 01/14/2016 with worsening nausea, vomiting and abdominal discomfort for past day prior to this admission. CT abdomen and pelvis on the admission demonstrated small bowel obstruction with proximal ileum entrapment in the ventral hernia.  Patient is status post exploratory laparotomy, LOA, extensive debridement of skin and sq tissue and muscle, repair of incarcerated incisional hernia with biologic mesh, VAC application by Dr. Derrell Lolling 01/15/2016 for Incarcerated incisional hernias, multiple, with small bowel obstruction ischemic fat necrosis of skin and subcutaneous tissue and fascia. She has midline VAC placed and NG tube, managed by surgery.  Major events since admission: 1/18 - ex lap by Dr. Derrell Lolling, SDU post op 1/21 - improving slowly, NGT in place  1/23 - allowing sips  1/24 - NGT out, advancing diet slowly  1/25 - CT chest with developing HCAP vs aspiration, multifocal PNA, added bread spectrum ABX   Assessment/Plan:    Principal problem: Exploratory laparotomy / repair of incarcerated incisional hernia / Leukocytosis  - CT abdomen and pelvis on the admission demonstrated small bowel obstruction with proximal ileum entrapment in the ventral hernia - Status post exploratory laparotomy, LOA, extensive debridement of skin and sq tissue and muscle, repair of incarcerated incisional hernia with biologic mesh, VAC application by Dr. Derrell Lolling 01/15/2016 for incarcerated  incisional hernias, multiple, with small bowel obstruction ischemic fat necrosis of skin and subcutaneous tissue and fascia.  - Continue VAC dressing changes MWF, seen by WOC in consultation - Appreciate surgery following   Active Problems: A-fib - Reported by nursing staff - Please note that this was reviewed and discussed with cardiology on call, no A-FIB - Repeat EKD NSR  Urinary tract infection, yeast  - Has had Klebsiella UTI during recent hospitalization. Urine culture on this admission growing yeast - Pt completed course of Fluconazole   Acute dyspnea - 1/23 CXR with pulmonary vascular congestion and possibly developing multifocal PNA vs HCAP and aspiration  - Continue current abx - Continue lasix for diuresis  Acute renal failure superimposed on chronic kidney disease stage 4 - Baseline creatinine is 1.34 and on this admission acutely elevated at 2.15 likely in the setting of acute illness - Creatinine remains WNL  Hypomagnesemia, hypokalemia - Supplemented   Anemia of chronic disease / Acute post operative blood loss anemia  - Due to combination of CKD and postoperative blood loss anemia  - Hemoglobin 8.6 today  Dyslipidemia associated with diabetes mellitus type 2 - Resume home meds Lipitor 10 mg at bedtime, omega 3 and fenofibrate when able to tolerate solids   Essential hypertension, benign - Continue as needed hydralazine   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 - Counseled on diet   DVT Prophylaxis  - Heparin subQ    Code Status: Full.  Family Communication:  plan of care discussed with the patient Disposition Plan:  Home probably by 2/1  IV access:  Peripheral IV  Procedures and diagnostic studies:    Exploratory  laparotomy, LOA, extensive debridement of skin and sq tissue and muscle, repair of incarcerated incisional hernia with biologic mesh, VAC application---Dr. Derrell Lolling 01/15/2016 for Incarcerated incisional hernias, multiple, with small bowel obstruction Ischemic fat necrosis of skin and subcutaneous tissue and fascia.  Ct Abdomen Pelvis Wo Contrast 01/14/2016 1. Small bowel obstruction related to entrapment of proximal ileum in a small ventral hernia. Proximal small bowel is dilated with multiple air-fluid levels measuring up to 5.6 cm in diameter. Distal small bowel is completely decompressed, and colon is largely decompressed as well. Emergent surgical consultation is strongly recommended. 2. Extensive airspace disease in the lung bases bilaterally (left greater than right), concerning for potential aspiration pneumonia. 3. Cholelithiasis without evidence of acute cholecystitis at this time. 4. Chronic mild right hydronephrosis with urothelial thickening in the right renal collecting system, poorly evaluated on today's noncontrast CT examination, but similar in retrospect to prior studies. Nonemergent Urology consultation is recommended for further evaluation. 5. Atherosclerosis, including right coronary artery disease. 6. There are calcifications of the mitral valve and mitral annulus. Echocardiographic correlation for evaluation of potential valvular dysfunction may be warranted if clinically indicated. 7. Additional incidental findings, as above. Electronically Signed By: Trudie Reed M.D. On: 01/14/2016 22:13   Dg Chest 1 View 01/20/2016 1. Cardiomegaly with mild pulmonary vascular congestion. 2. There is a 2.7 cm nodular opacity in the right midlung most concerning for pneumonia.   Ct Chest Wo Contrast 01/21/2016 Multifocal airspace process worse over the right upper lobe and left base likely multifocal pneumonia and likely secondary to aspiration with small amount of aspirate material noted within  the left posterior aspect of the mid to distal trachea. Small left pleural effusion. Minimal reactive mediastinal adenopathy. Stable cardiomegaly with severe calcification of the mitral valve annulus. 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   Medical Consultants:  Surgery   Other Consultants:  PT WOC DM coordinator  IAnti-Infectives:   Vanco ans zosyn 01/15/2016 --> 1/20 Rocephin 1/20 --> 1/25 Vanc resumed 1/25 for HCAP coverage -->  Maxipime 1/25 for HCAP coverage -->   Manson Passey, MD  [facility] Pager [phone]  Time spent in minutes: 25 minutes  If 7PM-7AM, please contact night-coverage [email or web address] Password [email or web address] 01/25/2016, 1:17 PM   LOS: 11 days    HPI/Subjective: No acute overnight events. Patient feels tired.  Objective: Filed Vitals:   01/23/16 2140 01/24/16 0554 01/24/16 1337 01/25/16 0637  BP: 138/59 136/67 151/65 139/56  Pulse: 87 89 75 105  Temp: 98.3 F (36.8 C) 97.6 F (36.4 C) 98.1 F (36.7 C) 97.8 F (36.6 C)  TempSrc: Oral Oral Oral Oral  Resp: Height:      Weight:      SpO2: 99% 99% 99% 100%    Intake/Output Summary (Last 24 hours) at 01/25/16 1317 Last data filed at 01/25/16 1228  Gross per 24 hour  Intake    140 ml  Output    150 ml  Net    -10 ml    Exam:   General:  Pt is alert, follows commands appropriately, not in acute distress  Cardiovascular: Regular rate and rhythm, S1/S2 (+)  Respiratory: Clear to auscultation bilaterally, no wheezing, no crackles, no rhonchi  Abdomen: Non tender, bowel sounds present  Extremities: No edema, pulses DP and PT palpable bilaterally  Neuro: Grossly  nonfocal  Data Reviewed: Basic Metabolic Panel:  Recent Labs Lab 01/21/16 0340 01/22/16 0330 01/23/16 0350 01/24/16 0520 01/25/16 0543  NA 144 140 138 137 142  K 4.0 3.6 3.6 3.4* 4.1  CL 106 103 100* 101 102  CO2 GLUCOSE 247* 212* 323*  215* 301*  BUN CREATININE 0.59 0.52 0.71 0.58 0.85  CALCIUM 8.8* 8.4* 8.5* 8.3* 8.9  MG 1.7 1.6* 1.7 1.8 1.7   Liver Function Tests: No results for input(s): AST, ALT, ALKPHOS, BILITOT, PROT, ALBUMIN in the last 168 hours. No results for input(s): LIPASE, AMYLASE in the last 168 hours. No results for input(s): AMMONIA in the last 168 hours. CBC:  Recent Labs Lab 01/21/16 0340 01/22/16 0330 01/23/16 0350 01/24/16 0520 01/25/16 0543  WBC 4.9 4.9 4.4 5.2 4.4  HGB 8.1* 8.2* 8.4* 8.3* 8.6*  HCT 27.1* 26.9* 28.1* 27.7* 29.2*  MCV 93.4 92.8 92.4 92.0 94.2  PLT 258 249 233 226 248   Cardiac Enzymes: No results for input(s): CKTOTAL, CKMB, CKMBINDEX, TROPONINI in the last 168 hours. BNP: Invalid input(s): POCBNP CBG:  Recent Labs Lab 01/23/16 2133 01/24/16 0858 01/24/16 1148 01/24/16 1634 01/24/16 2105  GLUCAP 185* 201* 162* 154* 138*    No results found for this or any previous visit (from the past 240 hour(s)).   Scheduled Meds: . antiseptic oral rinse  7 mL Mouth Rinse q12n4p  . ceFEPime (MAXIPIME) IV  2 g Intravenous 3 times per day  . chlorhexidine  15 mL Mouth Rinse BID  . fluticasone  2 spray Each Nare Daily  . furosemide  20 mg Intravenous Daily  . heparin  5,000 Units Subcutaneous 3 times per day  . insulin aspart  0-20 Units Subcutaneous TID WC  . insulin glargine  20 Units Subcutaneous QHS  . methocarbamol  1,000 mg Oral TID  . pantoprazole  40 mg Oral Daily  . polyethylene glycol  17 g Oral Daily  . senna-docusate  1 tablet Oral BID  . vancomycin  1,250 mg Intravenous Q12H

## 2016-01-25 NOTE — Progress Notes (Signed)
Patient ID: Valerie [name], female   DOB: [birth year], 70 y.o.   MRN: [id number]  General Surgery [facility]  Subjective: Pt without complaints.  Objective: Vital signs in last 24 hours: Temp:  [97.8 F (36.6 C)-98.1 F (36.7 C)] 97.8 F (36.6 C) (01/28 0637) Pulse Rate:  [75-105] 105 (01/28 0637) Resp:  [18] 18 (01/28 0637) BP: (139-151)/(56-65) 139/56 mmHg (01/28 0637) SpO2:  [99 %-100 %] 100 % (01/28 0637) Last BM Date: 01/24/16  Intake/Output from previous day: 01/27 0701 - 01/28 0700 In: 530 [P.O.:180; IV Piggyback:350] Out: 150 [Urine:150] Intake/Output this shift:    Physical Exam: HEENT - sclerae clear, mucous membranes moist.  Sleeping, but arousable.  Neck - soft Chest - shallow bilaterally Cor - RRR Abdomen - soft, vac in place.  Lateral portions of incision with staples c/d/i.  No erythema.  Soft.    Lab Results:   Recent Labs  01/24/16 0520 01/25/16 0543  WBC 5.2 4.4  HGB 8.3* 8.6*  HCT 27.7* 29.2*  PLT 226 248   BMET  Recent Labs  01/24/16 0520 01/25/16 0543  NA 137 142  K 3.4* PENDING  CL 101 102  CO2 27 29  GLUCOSE 215* 301*  BUN 10 11  CREATININE 0.58 0.85  CALCIUM 8.3* 8.9   PT/INR No results for input(s): LABPROT, INR in the last 72 hours. Comprehensive Metabolic Panel:    Component Value Date/Time   NA 142 01/25/2016 0543   NA 137 01/24/2016 0520   K PENDING 01/25/2016 0543   K 3.4* 01/24/2016 0520   CL 102 01/25/2016 0543   CL 101 01/24/2016 0520   CO2 29 01/25/2016 0543   CO2 27 01/24/2016 0520   BUN 11 01/25/2016 0543   BUN 10 01/24/2016 0520   CREATININE 0.85 01/25/2016 0543   CREATININE 0.58 01/24/2016 0520   CREATININE 1.27* 03/07/2014 0945   GLUCOSE 301* 01/25/2016 0543   GLUCOSE 215* 01/24/2016 0520   CALCIUM 8.9 01/25/2016 0543   CALCIUM 8.3* 01/24/2016 0520   AST 16 01/15/2016 0142   AST 19 01/14/2016 2029   ALT 13* 01/15/2016 0142   ALT 13* 01/14/2016 2029   ALKPHOS 61 01/15/2016 0142    ALKPHOS 61 01/14/2016 2029   BILITOT 1.1 01/15/2016 0142   BILITOT 1.0 01/14/2016 2029   PROT 6.5 01/15/2016 0142   PROT 6.8 01/14/2016 2029   ALBUMIN 2.7* 01/15/2016 0142   ALBUMIN 3.0* 01/14/2016 2029    Studies/Results: No results found.  Assessment & Plans: EXPLORATORY LAPAROTOMY, LYSIS OF ADHESIONS, EXTENSIVE DEBRIDEMENT OF SKIN AND SUBCUTANEOUS TISSUE AND MUSCLE, REPAIR OF INCARCERATED INCISION HERNIA WITH BIOLOGIC MESH, APPLICATION OF NEGATIVE PRESSURE DRESSING Soft diet. Ok for diet as tolerated. Mobilize - up to chair, ambulate with assist - PT following VAC dressing per WOC nurse  Lateral staples will need to come out 1/30-2/1. Pneumonia Pulmonary toilet, mobilize Abx per medical service Anxiety, pain control  Improved on current regimen Disposition  Consider Rehab consult vs SNF placement for rehab  Will follow with you.   [facility] Office: [phone]   [facility] 01/25/2016

## 2016-01-26 DIAGNOSIS — D638 Anemia in other chronic diseases classified elsewhere: Secondary | ICD-10-CM

## 2016-01-26 LAB — GLUCOSE, CAPILLARY
GLUCOSE-CAPILLARY: 181 mg/dL — AB (ref 65–99)
GLUCOSE-CAPILLARY: 243 mg/dL — AB (ref 65–99)
GLUCOSE-CAPILLARY: 129 mg/dL — AB (ref 65–99)
Glucose-Capillary: 247 mg/dL — ABNORMAL HIGH (ref 65–99)
Glucose-Capillary: 165 mg/dL — ABNORMAL HIGH (ref 65–99)

## 2016-01-26 MED ORDER — VANCOMYCIN HCL 10 G IV SOLR
1250.0000 mg | INTRAVENOUS | Status: DC
  Administered 2016-01-27 – 2016-01-28 (×2): 1250 mg via INTRAVENOUS
  Filled 2016-01-26 (×2): qty 1250

## 2016-01-26 NOTE — Progress Notes (Signed)
Patient ID: Valerie [name], female   DOB: 1946-02-16, 70 y.o.   MRN: [id number]  General Surgery [facility]  Subjective: Pt tolerating diet, but minimal appetite.    Objective: Vital signs in last 24 hours: Temp:  [98 F (36.7 C)-98.3 F (36.8 C)] 98 F (36.7 C) (01/29 0659) Pulse Rate:  [84-87] 87 (01/29 0659) Resp:  [24-26] 24 (01/29 0659) BP: (120-135)/(49-57) 120/57 mmHg (01/29 0659) SpO2:  [97 %-99 %] 97 % (01/29 0659) Weight:  [124.9 kg (275 lb 5.7 oz)-125.6 kg (276 lb 14.4 oz)] 124.9 kg (275 lb 5.7 oz) (01/29 0659) Last BM Date: 01/24/16  Intake/Output from previous day: 01/28 0701 - 01/29 0700 In: 2340 [P.O.:1290; IV Piggyback:1050] Out: 1400 [Urine:1400] Intake/Output this shift:    Physical Exam: HEENT - sclerae clear, mucous membranes moist.  Sleeping, but arousable.  Neck - soft Repsp.  Breathing comfortably Abdomen - soft, vac in place.  Lateral portions of incision with staples c/d/i.  No erythema.  Soft.    Lab Results:   Recent Labs  01/24/16 0520 01/25/16 0543  WBC 5.2 4.4  HGB 8.3* 8.6*  HCT 27.7* 29.2*  PLT 226 248   BMET  Recent Labs  01/24/16 0520 01/25/16 0543  NA 137 142  K 3.4* 4.1  CL 101 102  CO2 27 29  GLUCOSE 215* 301*  BUN 10 11  CREATININE 0.58 0.85  CALCIUM 8.3* 8.9   PT/INR No results for input(s): LABPROT, INR in the last 72 hours. Comprehensive Metabolic Panel:    Component Value Date/Time   NA 142 01/25/2016 0543   NA 137 01/24/2016 0520   K 4.1 01/25/2016 0543   K 3.4* 01/24/2016 0520   CL 102 01/25/2016 0543   CL 101 01/24/2016 0520   CO2 29 01/25/2016 0543   CO2 27 01/24/2016 0520   BUN 11 01/25/2016 0543   BUN 10 01/24/2016 0520   CREATININE 0.85 01/25/2016 0543   CREATININE 0.58 01/24/2016 0520   CREATININE 1.27* 03/07/2014 0945   GLUCOSE 301* 01/25/2016 0543   GLUCOSE 215* 01/24/2016 0520   CALCIUM 8.9 01/25/2016 0543   CALCIUM 8.3* 01/24/2016 0520   AST 16 01/15/2016 0142   AST 19 01/14/2016 2029   ALT 13* 01/15/2016 0142   ALT 13* 01/14/2016 2029   ALKPHOS 61 01/15/2016 0142   ALKPHOS 61 01/14/2016 2029   BILITOT 1.1 01/15/2016 0142   BILITOT 1.0 01/14/2016 2029   PROT 6.5 01/15/2016 0142   PROT 6.8 01/14/2016 2029   ALBUMIN 2.7* 01/15/2016 0142   ALBUMIN 3.0* 01/14/2016 2029    Studies/Results: No results found.  Assessment & Plans: EXPLORATORY LAPAROTOMY, LYSIS OF ADHESIONS, EXTENSIVE DEBRIDEMENT OF SKIN AND SUBCUTANEOUS TISSUE AND MUSCLE, REPAIR OF INCARCERATED INCISION HERNIA WITH BIOLOGIC MESH, APPLICATION OF NEGATIVE PRESSURE DRESSING Diet as tolerated.   Mobilize - up to chair, ambulate with assist - PT following VAC dressing per WOC nurse  Lateral staples will need to come out 1/30-2/1. Would make sense to remove with vac change Monday or Wednesday.   Pneumonia Pulmonary toilet, mobilize Abx per medical service Anxiety, pain control  Improved on current regimen Disposition  Consider Rehab consult vs SNF placement for rehab  Will follow with you.   [facility] Office: [phone]   [facility] 01/26/2016

## 2016-01-26 NOTE — Progress Notes (Signed)
Pharmacy Antibiotic Follow-up Note  Valerie [name] is a 70 y.o. year-old female admitted on 01/14/2016.  The patient is currently on day #5 of vancomycin and cefepime for suspected PNA.  MD's note on 1/27 indicated possible transition to oral abx on 1/28, but now plan is to continue IV abx thru 01/30/16.  Vancomycin trough level drawn today now back supra-therapeutic at 29 (goal 15-20).  RN has not given the 2PM dose.  Assessment/Plan: - continue cefepime 2gm IV q8h - reduce vancomycin to 1250 mg IV q24h (next dose due on 1/30 at 0200) - monitor renal function    Temp (24hrs), Avg:97.9 F (36.6 C), Min:97.4 F (36.3 C), Max:98.2 F (36.8 C)   Recent Labs Lab 01/21/16 0340 01/22/16 0330 01/23/16 0350 01/24/16 0520 01/25/16 0543  WBC 4.9 4.9 4.4 5.2 4.4     Recent Labs Lab 01/21/16 0340 01/22/16 0330 01/23/16 0350 01/24/16 0520 01/25/16 0543  CREATININE 0.59 0.52 0.71 0.58 0.85   Estimated Creatinine Clearance: 79.3 mL/min (by C-G formula based on Cr of 0.85).    No Known Allergies  Antimicrobials this admission: 1/18 >> Zosyn >> 1/20  1/18 >> Vanc >> 1/20, 1/25 >> Vanc >> 1/20 >> Fluconazole >> 1/26 1/20 >> Ceftriaxone >> 1/21 1/25 >> Cefepime>>   Levels/dose changes this admission: 1/19 SCr improved, vanc dose increased to 1500 mg IV q24h per nomogram. 1/29: VT at 1338= 29 (goal 15-20) --on 1250 mg q12h   Microbiology results: Past urine cx from 1/8 = Klebsiella, (R to ampicillin, I to nitrofurantoin, S to rest) 1/18 blood x 2: NGF 1/18 urine: 50,000 colonies of yeast  1/18 MRSA PCR: neg 1/25 Strep pneumo antigen: neg   Thank you for allowing pharmacy to be a part of this patient's care.  Lucia Gaskins PharmD 01/26/2016 2:25 PM

## 2016-01-26 NOTE — Progress Notes (Addendum)
Patient ID: Valerie [name], female   DOB: August 10, 1946, 70 y.o.   MRN: [id number] [facility] PROGRESS NOTE  Jasha Hodzic [id number]:[id number] DOB: October 29, 1946 DOA: 01/14/2016 PCP: Garth Schlatter, MD  Brief narrative:    70 y.o. female with past medical history significant for overactive bladder, chronic UTIs, necrotizing pancreatitis status post partial pancreatectomy, morbid obesity, type 2 diabetes mellitus, just recently discharged after being hospitalized for small bowel obstruction. Patient presented to [facility] long hospital 01/14/2016 with worsening nausea, vomiting and abdominal discomfort for past day prior to this admission. CT abdomen and pelvis on the admission demonstrated small bowel obstruction with proximal ileum entrapment in the ventral hernia.  Patient is status post exploratory laparotomy, LOA, extensive debridement of skin and sq tissue and muscle, repair of incarcerated incisional hernia with biologic mesh, VAC application by Dr. Derrell Lolling 01/15/2016 for Incarcerated incisional hernias, multiple, with small bowel obstruction ischemic fat necrosis of skin and subcutaneous tissue and fascia. She has midline VAC placed and NG tube, managed by surgery.  Major events since admission: 1/18 - ex lap by Dr. Derrell Lolling, SDU post op 1/21 - improving slowly, NGT in place  1/23 - allowing sips  1/24 - NGT out, advancing diet slowly  1/25 - CT chest with developing HCAP vs aspiration, multifocal PNA, added bread spectrum ABX   Assessment/Plan:    Principal problem: Exploratory laparotomy / repair of incarcerated incisional hernia / Leukocytosis  - CT abdomen and pelvis on the admission demonstrated small bowel obstruction with proximal ileum entrapment in the ventral hernia - Status post exploratory laparotomy, LOA, extensive debridement of skin and sq tissue and muscle, repair of incarcerated incisional hernia with biologic mesh, VAC application by Dr. Derrell Lolling 01/15/2016 for incarcerated  incisional hernias, multiple, with small bowel obstruction ischemic fat necrosis of skin and subcutaneous tissue and fascia.  - Continue VAC dressing changes MWF - Appreciate surgery following, plan for suture removal during VAC dressing change   Active Problems: Urinary tract infection, yeast  - Has had Klebsiella UTI during recent hospitalization. Urine culture on this admission growing yeast - Pt completed course of Fluconazole   Acute respiratory failure with hypoxia - 1/23 CXR with pulmonary vascular congestion and possibly developing multifocal PNA vs HCAP and aspiration  - Pt on IV lasix 20 mg daily  - Stable respiratory status   Acute renal failure superimposed on chronic kidney disease stage 4 - Baseline creatinine is 1.34 and on this admission acutely elevated at 2.15 likely in the setting of acute illness - Creatinine now WNL  Hypomagnesemia / hypokalemia - Supplemented  - Check BMP and magnesium level in am  Anemia of chronic disease / Acute post operative blood loss anemia  - Due to combination of CKD and postoperative blood loss anemia  - Hemoglobin 8.6, stable - Check CBC in am  Dyslipidemia associated with diabetes mellitus type 2 - At home she takes Lipitor 10 mg at bedtime, omega 3 and fenofibrate   Essential hypertension, benign - Pt on lasix 20 mg IV daily  - Continue as needed hydralazine   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 - Counseled on diet   DVT Prophylaxis  - Heparin subQ in hospital    Code  Status: Full.  Family Communication:  plan of care discussed with the patient Disposition Plan: Home versus SNF probably by 2/1  IV access:  Peripheral IV  Procedures and  diagnostic studies:    Exploratory laparotomy, LOA, extensive debridement of skin and sq tissue and muscle, repair of incarcerated incisional hernia with biologic mesh, VAC application---Dr. Derrell Lolling 01/15/2016 for Incarcerated incisional hernias, multiple, with small bowel obstruction Ischemic fat necrosis of skin and subcutaneous tissue and fascia.  Ct Abdomen Pelvis Wo Contrast 01/14/2016 1. Small bowel obstruction related to entrapment of proximal ileum in a small ventral hernia. Proximal small bowel is dilated with multiple air-fluid levels measuring up to 5.6 cm in diameter. Distal small bowel is completely decompressed, and colon is largely decompressed as well. Emergent surgical consultation is strongly recommended. 2. Extensive airspace disease in the lung bases bilaterally (left greater than right), concerning for potential aspiration pneumonia. 3. Cholelithiasis without evidence of acute cholecystitis at this time. 4. Chronic mild right hydronephrosis with urothelial thickening in the right renal collecting system, poorly evaluated on today's noncontrast CT examination, but similar in retrospect to prior studies. Nonemergent Urology consultation is recommended for further evaluation. 5. Atherosclerosis, including right coronary artery disease. 6. There are calcifications of the mitral valve and mitral annulus. Echocardiographic correlation for evaluation of potential valvular dysfunction may be warranted if clinically indicated. 7. Additional incidental findings, as above. Electronically Signed By: Trudie Reed M.D. On: 01/14/2016 22:13   Dg Chest 1 View 01/20/2016 1. Cardiomegaly with mild pulmonary vascular congestion. 2. There is a 2.7 cm nodular opacity in the right midlung most concerning for pneumonia.   Ct Chest Wo Contrast 01/21/2016 Multifocal airspace process worse over the right upper lobe and left base likely multifocal pneumonia and likely secondary to aspiration with small amount  of aspirate material noted within the left posterior aspect of the mid to distal trachea. Small left pleural effusion. Minimal reactive mediastinal adenopathy. Stable cardiomegaly with severe calcification of the mitral valve annulus. 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   Medical Consultants:  Surgery   Other Consultants:  PT WOC DM coordinator  IAnti-Infectives:   Vanco ans zosyn 01/15/2016 --> 1/20 Rocephin 1/20 --> 1/25 Vanc resumed 1/25 for HCAP coverage -->  Maxipime 1/25 for HCAP coverage -->   Manson Passey, MD  [facility] Pager [phone]  Time spent in minutes: 15 minutes  If 7PM-7AM, please contact night-coverage [email or web address] Password [email or web address] 01/26/2016, 2:25 PM   LOS: 12 days    HPI/Subjective: No acute overnight events. Patient feels little better, not much appetite.   Objective: Filed Vitals:   01/25/16 1358 01/25/16 2048 01/26/16 0659 01/26/16 1211  BP: 135/55 121/49 120/57 150/70  Pulse: 84 84 87 81  Temp: 98.3 F (36.8 C) 98.2 F (36.8 C) 98 F (36.7 C) 97.4 F (36.3 C)  TempSrc: Oral Oral Oral Oral  Resp: Height:      Weight: 276 lb 14.4 oz (125.6 kg)  275 lb 5.7 oz (124.9 kg)   SpO2: 99% 97% 97% 100%    Intake/Output Summary (Last 24 hours) at 01/26/16 1425 Last data filed at 01/26/16 4540  Gross per 24 hour  Intake   2250 ml  Output   1400 ml  Net    850 ml    Exam:   General:  Pt is awake, no distress   Cardiovascular: Rate controlled, appreciate S1, S2   Respiratory: no wheezing, no rhonchi  Abdomen: (+) BS, tender at surgery site  Extremities: No swelling, pulses palpable   Neuro: No focal deficits   Data Reviewed: Basic Metabolic Panel:  Recent Labs Lab 01/21/16 0340 01/22/16 0330 01/23/16 0350 01/24/16 0520 01/25/16 0543  NA 144 140 138 137 142  K 4.0 3.6 3.6 3.4* 4.1  CL 106 103 100* 101 102  CO2 GLUCOSE 247* 212* 323*  215* 301*  BUN CREATININE 0.59 0.52 0.71 0.58 0.85  CALCIUM 8.8* 8.4* 8.5* 8.3* 8.9  MG 1.7 1.6* 1.7 1.8 1.7   Liver Function Tests: No results for input(s): AST, ALT, ALKPHOS, BILITOT, PROT, ALBUMIN in the last 168 hours. No results for input(s): LIPASE, AMYLASE in the last 168 hours. No results for input(s): AMMONIA in the last 168 hours. CBC:  Recent Labs Lab 01/21/16 0340 01/22/16 0330 01/23/16 0350 01/24/16 0520 01/25/16 0543  WBC 4.9 4.9 4.4 5.2 4.4  HGB 8.1* 8.2* 8.4* 8.3* 8.6*  HCT 27.1* 26.9* 28.1* 27.7* 29.2*  MCV 93.4 92.8 92.4 92.0 94.2  PLT 258 249 233 226 248   Cardiac Enzymes: No results for input(s): CKTOTAL, CKMB, CKMBINDEX, TROPONINI in the last 168 hours. BNP: Invalid input(s): POCBNP CBG:  Recent Labs Lab 01/24/16 1634 01/24/16 2105 01/25/16 0745 01/25/16 1134 01/25/16 1658  GLUCAP 154* 138* 291* 258* 211*    No results found for this or any previous visit (from the past 240 hour(s)).   Scheduled Meds: . antiseptic oral rinse  7 mL Mouth Rinse q12n4p  . ceFEPime (MAXIPIME) IV  2 g Intravenous 3 times per day  . chlorhexidine  15 mL Mouth Rinse BID  . fluticasone  2 spray Each Nare Daily  . furosemide  20 mg Intravenous Daily  . heparin  5,000 Units Subcutaneous 3 times per day  . insulin aspart  0-20 Units Subcutaneous TID WC  . insulin glargine  20 Units Subcutaneous QHS  . methocarbamol  1,000 mg Oral TID  . pantoprazole  40 mg Oral Daily  . polyethylene glycol  17 g Oral Daily  . senna-docusate  1 tablet Oral BID  . vancomycin  1,250 mg Intravenous Q12H

## 2016-01-26 NOTE — Progress Notes (Signed)
Paged vanc trough level [phone] to Dr. Elisabeth Pigeon.

## 2016-01-27 DIAGNOSIS — I10 Essential (primary) hypertension: Secondary | ICD-10-CM

## 2016-01-27 DIAGNOSIS — L8990 Pressure ulcer of unspecified site, unspecified stage: Secondary | ICD-10-CM | POA: Insufficient documentation

## 2016-01-27 LAB — GLUCOSE, CAPILLARY
GLUCOSE-CAPILLARY: 187 mg/dL — AB (ref 65–99)
Glucose-Capillary: 197 mg/dL — ABNORMAL HIGH (ref 65–99)
Glucose-Capillary: 163 mg/dL — ABNORMAL HIGH (ref 65–99)
Glucose-Capillary: 137 mg/dL — ABNORMAL HIGH (ref 65–99)

## 2016-01-27 LAB — VANCOMYCIN, TROUGH: VANCOMYCIN TR: 29 ug/mL — AB (ref 10.0–20.0)

## 2016-01-27 NOTE — Progress Notes (Signed)
Patient ID: Valerie [name], female   DOB: Sep 03, 1946, 70 y.o.   MRN: [id number] [facility] PROGRESS NOTE  Valerie [name] [id number]:[id number] DOB: 17-Jul-1946 DOA: 01/14/2016 PCP: Garth Schlatter, MD  Brief narrative:    70 y.o. female with past medical history significant for overactive bladder, chronic UTIs, necrotizing pancreatitis status post partial pancreatectomy, morbid obesity, type 2 diabetes mellitus, just recently discharged after being hospitalized for small bowel obstruction. Patient presented to [facility] hospital 01/14/2016 with worsening nausea, vomiting and abdominal discomfort for past day prior to this admission. CT abdomen and pelvis on the admission demonstrated small bowel obstruction with proximal ileum entrapment in the ventral hernia.  Patient is status post exploratory laparotomy, LOA, extensive debridement of skin and sq tissue and muscle, repair of incarcerated incisional hernia with biologic mesh, VAC application by Dr. Derrell Lolling 01/15/2016 for Incarcerated incisional hernias, multiple, with small bowel obstruction ischemic fat necrosis of skin and subcutaneous tissue and fascia. She has midline VAC placed and NG tube, managed by surgery.  Major events since admission: 1/18 - ex lap by Dr. Derrell Lolling, SDU post op 1/21 - improving slowly, NGT in place  1/23 - allowing sips  1/24 - NGT out, advancing diet slowly  1/25 - CT chest with developing HCAP vs aspiration, multifocal PNA, added bread spectrum ABX   Assessment/Plan:    Principal problem: Exploratory laparotomy / repair of incarcerated incisional hernia / Leukocytosis  - CT abdomen and pelvis on the admission demonstrated small bowel obstruction with proximal ileum entrapment in the ventral hernia - Status post exploratory laparotomy, LOA, extensive debridement of skin and sq tissue and muscle, repair of incarcerated incisional hernia with biologic mesh, VAC application by Dr. Derrell Lolling 01/15/2016 for incarcerated  incisional hernias, multiple, with small bowel obstruction ischemic fat necrosis of skin and subcutaneous tissue and fascia.  - Continue VAC dressing changes MWF - Appreciate surgery following and their recommendations   Active Problems: Urinary tract infection, yeast  - Has had Klebsiella UTI during recent hospitalization. Urine culture on this admission growing yeast - Pt completed course of Fluconazole   Acute respiratory failure with hypoxia - 1/23 CXR with pulmonary vascular congestion and possibly developing multifocal PNA vs HCAP and aspiration  - Continue IV lasix 20 mg daily  - Stable respiratory status   Acute renal failure superimposed on chronic kidney disease stage 4 - Baseline creatinine is 1.34 and on this admission acutely elevated at 2.15 likely in the setting of acute illness - Creatinine WNL  Hypomagnesemia / hypokalemia - Supplemented  - Check BMP tomorrow am - Check magnesium in am  Anemia of chronic disease / Acute post operative blood loss anemia  - Due to combination of CKD and postoperative blood loss anemia  - Hemoglobin 8.6, stable - Check CBC tomorrow am  Dyslipidemia associated with diabetes mellitus type 2 - At home pt takes Lipitor 10 mg at bedtime, omega 3 and fenofibrate   Essential hypertension, benign - Pt on lasix 20 mg IV daily  - BP controlled   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 - Counseled on diet   DVT Prophylaxis  - Heparin subQ ordered    Code Status: Full.  Family Communication:  plan of  care discussed with the patient Disposition Plan: Home versus SNF probably by 2/1  IV access:  Peripheral IV  Procedures and diagnostic studies:     Exploratory laparotomy, LOA, extensive debridement of skin and sq tissue and muscle, repair of incarcerated incisional hernia with biologic mesh, VAC application---Dr. Derrell Lolling 01/15/2016 for Incarcerated incisional hernias, multiple, with small bowel obstruction Ischemic fat necrosis of skin and subcutaneous tissue and fascia.  Ct Abdomen Pelvis Wo Contrast 01/14/2016 1. Small bowel obstruction related to entrapment of proximal ileum in a small ventral hernia. Proximal small bowel is dilated with multiple air-fluid levels measuring up to 5.6 cm in diameter. Distal small bowel is completely decompressed, and colon is largely decompressed as well. Emergent surgical consultation is strongly recommended. 2. Extensive airspace disease in the lung bases bilaterally (left greater than right), concerning for potential aspiration pneumonia. 3. Cholelithiasis without evidence of acute cholecystitis at this time. 4. Chronic mild right hydronephrosis with urothelial thickening in the right renal collecting system, poorly evaluated on today's noncontrast CT examination, but similar in retrospect to prior studies. Nonemergent Urology consultation is recommended for further evaluation. 5. Atherosclerosis, including right coronary artery disease. 6. There are calcifications of the mitral valve and mitral annulus. Echocardiographic correlation for evaluation of potential valvular dysfunction may be warranted if clinically indicated. 7. Additional incidental findings, as above. Electronically Signed By: Trudie Reed M.D. On: 01/14/2016 22:13   Dg Chest 1 View 01/20/2016 1. Cardiomegaly with mild pulmonary vascular congestion. 2. There is a 2.7 cm nodular opacity in the right midlung most concerning for pneumonia.   Ct Chest Wo Contrast 01/21/2016 Multifocal airspace process worse over the right upper lobe and left base likely multifocal pneumonia and likely secondary to aspiration with small amount of aspirate material  noted within the left posterior aspect of the mid to distal trachea. Small left pleural effusion. Minimal reactive mediastinal adenopathy. Stable cardiomegaly with severe calcification of the mitral valve annulus. 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   Medical Consultants:  Surgery   Other Consultants:  PT WOC DM coordinator  IAnti-Infectives:   Vanco ans zosyn 01/15/2016 --> 1/20 Rocephin 1/20 --> 1/25 Vanc resumed 1/25 for HCAP coverage -->  Maxipime 1/25 for HCAP coverage -->   Manson Passey, MD  [facility] Pager [phone]  Time spent in minutes: 15 minutes  If 7PM-7AM, please contact night-coverage [email or web address] Password [email or web address] 01/27/2016, 11:10 AM   LOS: 13 days    HPI/Subjective: No acute overnight events. Patient feels better.  Objective: Filed Vitals:   01/26/16 0659 01/26/16 1211 01/26/16 2125 01/27/16 0408  BP: 120/57 150/70 138/63 135/65  Pulse: 87 81 81 94  Temp: 98 F (36.7 C) 97.4 F (36.3 C) 98.2 F (36.8 C) 98.6 F (37 C)  TempSrc: Oral Oral Oral Oral  Resp: Height:      Weight: 275 lb 5.7 oz (124.9 kg)   272 lb 6.4 oz (123.56 kg)  SpO2: 97% 100% 100% 99%    Intake/Output Summary (Last 24 hours) at 01/27/16 1110 Last data filed at 01/27/16 1034  Gross per 24 hour  Intake   1170 ml  Output   1900 ml  Net   -730 ml    Exam:   General:  Pt is more alert, no distress   Cardiovascular: RRR, appreciate S1, S2   Respiratory: bilateral air entry, no wheezing   Abdomen: some tenderness in mid abdomen, (+) BS  Extremities: No  edema, palpable pulses   Neuro: Nonfocal   Data Reviewed: Basic Metabolic Panel:  Recent Labs Lab 01/21/16 0340 01/22/16 0330 01/23/16 0350 01/24/16 0520 01/25/16 0543  NA 144 140 138 137 142  K 4.0 3.6 3.6 3.4* 4.1  CL 106 103 100* 101 102  CO2 GLUCOSE 247* 212* 323* 215* 301*  BUN CREATININE 0.59  0.52 0.71 0.58 0.85  CALCIUM 8.8* 8.4* 8.5* 8.3* 8.9  MG 1.7 1.6* 1.7 1.8 1.7   Liver Function Tests: No results for input(s): AST, ALT, ALKPHOS, BILITOT, PROT, ALBUMIN in the last 168 hours. No results for input(s): LIPASE, AMYLASE in the last 168 hours. No results for input(s): AMMONIA in the last 168 hours. CBC:  Recent Labs Lab 01/21/16 0340 01/22/16 0330 01/23/16 0350 01/24/16 0520 01/25/16 0543  WBC 4.9 4.9 4.4 5.2 4.4  HGB 8.1* 8.2* 8.4* 8.3* 8.6*  HCT 27.1* 26.9* 28.1* 27.7* 29.2*  MCV 93.4 92.8 92.4 92.0 94.2  PLT 258 249 233 226 248   Cardiac Enzymes: No results for input(s): CKTOTAL, CKMB, CKMBINDEX, TROPONINI in the last 168 hours. BNP: Invalid input(s): POCBNP CBG:  Recent Labs Lab 01/26/16 0743 01/26/16 1132 01/26/16 1714 01/26/16 2131 01/27/16 0733  GLUCAP 243* 247* 165* 129* 187*    No results found for this or any previous visit (from the past 240 hour(s)).   Scheduled Meds: . antiseptic oral rinse  7 mL Mouth Rinse q12n4p  . ceFEPime (MAXIPIME) IV  2 g Intravenous 3 times per day  . chlorhexidine  15 mL Mouth Rinse BID  . fluticasone  2 spray Each Nare Daily  . furosemide  20 mg Intravenous Daily  . heparin  5,000 Units Subcutaneous 3 times per day  . insulin aspart  0-20 Units Subcutaneous TID WC  . insulin glargine  20 Units Subcutaneous QHS  . methocarbamol  1,000 mg Oral TID  . pantoprazole  40 mg Oral Daily  . polyethylene glycol  17 g Oral Daily  . senna-docusate  1 tablet Oral BID  . vancomycin  1,250 mg Intravenous Q24H

## 2016-01-27 NOTE — Progress Notes (Signed)
12 Days Post-Op  Subjective: Complains of soreness and weakness  Objective: Vital signs in last 24 hours: Temp:  [97.4 F (36.3 C)-98.6 F (37 C)] 98.6 F (37 C) (01/30 0408) Pulse Rate:  [81-94] 94 (01/30 0408) Resp:  [18-20] 20 (01/30 0408) BP: (135-150)/(63-70) 135/65 mmHg (01/30 0408) SpO2:  [99 %-100 %] 99 % (01/30 0408) Weight:  [123.56 kg (272 lb 6.4 oz)] 123.56 kg (272 lb 6.4 oz) (01/30 0408) Last BM Date: 01/26/16  Intake/Output from previous day: 01/29 0701 - 01/30 0700 In: 1170 [P.O.:720; IV Piggyback:450] Out: 1500 [Urine:1300; Drains:200] Intake/Output this shift:    Resp: clear to auscultation bilaterally Cardio: regular rate and rhythm GI: soft, vac in place. tolerating diet  Lab Results:   Recent Labs  01/25/16 0543  WBC 4.4  HGB 8.6*  HCT 29.2*  PLT 248   BMET  Recent Labs  01/25/16 0543  NA 142  K 4.1  CL 102  CO2 29  GLUCOSE 301*  BUN 11  CREATININE 0.85  CALCIUM 8.9   PT/INR No results for input(s): LABPROT, INR in the last 72 hours. ABG No results for input(s): PHART, HCO3 in the last 72 hours.  Invalid input(s): PCO2, PO2  Studies/Results: No results found.  Anti-infectives: Anti-infectives    Start     Dose/Rate Route Frequency Ordered Stop   01/27/16 0200  vancomycin (VANCOCIN) 1,250 mg in sodium chloride 0.9 % 250 mL IVPB     1,250 mg 166.7 mL/hr over 90 Minutes Intravenous Every 24 hours 01/26/16 1429 01/30/16 2300   01/23/16 0200  vancomycin (VANCOCIN) 1,250 mg in sodium chloride 0.9 % 250 mL IVPB  Status:  Discontinued     1,250 mg 166.7 mL/hr over 90 Minutes Intravenous Every 12 hours 01/22/16 1323 01/26/16 1429   01/22/16 1400  ceFEPIme (MAXIPIME) 1 g in dextrose 5 % 50 mL IVPB  Status:  Discontinued     1 g 100 mL/hr over 30 Minutes Intravenous 3 times per day 01/22/16 1303 01/22/16 1344   01/22/16 1400  ceFEPIme (MAXIPIME) 2 g in dextrose 5 % 50 mL IVPB     2 g 100 mL/hr over 30 Minutes Intravenous 3 times per  day 01/22/16 1344 01/30/16 1359   01/22/16 1330  vancomycin (VANCOCIN) 2,500 mg in sodium chloride 0.9 % 500 mL IVPB     2,500 mg 250 mL/hr over 120 Minutes Intravenous  Once 01/22/16 1323 01/22/16 1543   01/17/16 1700  cefTRIAXone (ROCEPHIN) 2 g in dextrose 5 % 50 mL IVPB    Comments:  Pharmacy may adjust dosing strength / duration / interval for maximal efficacy   2 g 100 mL/hr over 30 Minutes Intravenous Every 24 hours 01/17/16 1625 01/18/16 1703   01/17/16 1200  fluconazole (DIFLUCAN) IVPB 100 mg  Status:  Discontinued     100 mg 50 mL/hr over 60 Minutes Intravenous Every 24 hours 01/17/16 1019 01/23/16 1303   01/17/16 0600  vancomycin (VANCOCIN) 1,750 mg in sodium chloride 0.9 % 500 mL IVPB  Status:  Discontinued     1,750 mg 250 mL/hr over 120 Minutes Intravenous Every 48 hours 01/15/16 0146 01/16/16 1126   01/16/16 1300  vancomycin (VANCOCIN) 1,500 mg in sodium chloride 0.9 % 500 mL IVPB  Status:  Discontinued     1,500 mg 250 mL/hr over 120 Minutes Intravenous Every 24 hours 01/16/16 1125 01/17/16 1208   01/16/16 0600  cefoTEtan (CEFOTAN) 2 g in dextrose 5 % 50 mL IVPB  Status:  Discontinued     2 g 100 mL/hr over 30 Minutes Intravenous On call to O.R. 01/15/16 1700 01/15/16 1709   01/15/16 1130  cefTRIAXone (ROCEPHIN) 1 g in dextrose 5 % 50 mL IVPB  Status:  Discontinued     1 g 100 mL/hr over 30 Minutes Intravenous Every 24 hours 01/15/16 1123 01/15/16 1124   01/15/16 0200  piperacillin-tazobactam (ZOSYN) IVPB 3.375 g  Status:  Discontinued     3.375 g 12.5 mL/hr over 240 Minutes Intravenous 3 times per day 01/15/16 0049 01/17/16 1625   01/15/16 0130  vancomycin (VANCOCIN) 2,500 mg in sodium chloride 0.9 % 500 mL IVPB     2,500 mg 250 mL/hr over 120 Minutes Intravenous  Once 01/15/16 0055 01/15/16 0342   01/14/16 2230  cefTRIAXone (ROCEPHIN) 1 g in dextrose 5 % 50 mL IVPB     1 g 100 mL/hr over 30 Minutes Intravenous  Once 01/14/16 2226 01/14/16 2326       Assessment/Plan: s/p Procedure(s): EXPLORATORY LAPAROTOMY, LYSIS OF ADHESIONS, EXTENSIVE DEBRIDEMENT OF SKIN AND SUBCUTANEOUS TISSUE AND MUSCLE, REPAIR OF INCARCERATED INCISION HERNIA WITH BIOLOGIC MESH, APPLICATION OF NEGATIVE PRESSURE DRESSING (N/A) Advance diet  Physical therapy SNF vs Rehab Dressing and vac change  LOS: 13 days    TOTH III,PAUL S 01/27/2016

## 2016-01-27 NOTE — Consult Note (Signed)
WOC wound follow up Wound type:Open abdominal wound Measurement: per Wednesday, 01/22/16 Wound bed:red, moist with 5 x 7cm area in center exposing mesh Drainage (amount, consistency, odor) moderate serosanguinous in cannister Periwound:intact Dressing procedure/placement/frequency:Replacement of VAC dressing after wound cleansed with NS and patted day.  Staples removed per Dr. Arita Miss request.  1 piece of mepitel placed over exposed mesh, 1 pieces of black foam used to obliterate dead space and is tucked into mildly undermined areas at inferior margin.  Seal immediately achieved at continuous negative pressure and tolerated well by patient. Next dressing change due on Wednesday, 01/29/16 at which time measurements are due. WOC nursing team will follow, and will remain available to this patient, the nursing and medical teams.   Ladona Mow, MSN, RN, GNP, [facility]  Pager# [phone]

## 2016-01-27 NOTE — Progress Notes (Signed)
Physical Therapy Treatment Patient Details Name: Azaleah Usman MRN: [id number] DOB: 1946-07-17 Today's Date: 01/27/2016    History of Present Illness patient is 70 yo female admitted 01/14/16 is status post exploratory laparotomy, extensive debridement of skin and sq tissue and muscle, repair of incarcerated incisional hernia with biologic mesh, VAC application by Dr. Derrell Lolling 01/15/2016 for Incarcerated incisional hernias, multiple, with small bowel obstruction ischemic fat necrosis of skin and subcutaneous tissue and fascia. She has midline VAC placed and NG tube, managed by surgery.    PT Comments    Pt ambulated 92' with RW, which is progress compared to last PT session. Distance limited by fatigue. LBP and dyspnea.   Follow Up Recommendations  SNF;Supervision/Assistance - 24 hour; HHPT if pt refuses SNF     Equipment Recommendations  None recommended by PT    Recommendations for Other Services       Precautions / Restrictions Precautions Precautions: Fall Precaution Comments: VAC, abdominal incision Restrictions Weight Bearing Restrictions: No    Mobility  Bed Mobility Overal bed mobility: Needs Assistance Bed Mobility: Sit to Supine       Sit to supine: Max assist   General bed mobility comments: max A for BLEs into bed  Transfers Overall transfer level: Needs assistance Equipment used: Rolling walker (2 wheeled) Transfers: Sit to/from Stand Sit to Stand: Min assist;+2 safety/equipment;+2 physical assistance         General transfer comment: verbal cues for hand placment  Ambulation/Gait Ambulation/Gait assistance: Min guard Ambulation Distance (Feet): 45 Feet Assistive device: Rolling walker (2 wheeled) Gait Pattern/deviations: Step-to pattern   Gait velocity interpretation: Below normal speed for age/gender General Gait Details: distance limited by fatigue, 3/4 dyspnea with ambulation, will check SaO2 next visit   Stairs            Wheelchair  Mobility    Modified Rankin (Stroke Patients Only)       Balance     Sitting balance-Leahy Scale: Fair       Standing balance-Leahy Scale: Poor                      Cognition Arousal/Alertness: Awake/alert Behavior During Therapy: WFL for tasks assessed/performed Overall Cognitive Status: Within Functional Limits for tasks assessed                      Exercises      General Comments        Pertinent Vitals/Pain Pain Score: 5  Pain Location: back Pain Intervention(s): Monitored during session;Limited activity within patient's tolerance;Premedicated before session    Home Living                      Prior Function            PT Goals (current goals can now be found in the care plan section) Acute Rehab PT Goals Patient Stated Goal: did not state  PT Goal Formulation: With patient Time For Goal Achievement: 02/01/16 Potential to Achieve Goals: Good Progress towards PT goals: Progressing toward goals    Frequency  Min 3X/week    PT Plan Current plan remains appropriate    Co-evaluation             End of Session Equipment Utilized During Treatment: Gait belt Activity Tolerance: Patient limited by pain;Patient limited by fatigue Patient left: with call bell/phone within reach;in bed;with bed alarm set     Time: 4098-1191 PT Time Calculation (min) (  ACUTE ONLY): 28 min  Charges:  $Gait Training: 8-22 mins $Therapeutic Activity: 8-22 mins                    G Codes:      Tamala Ser 01/27/2016, 2:43 PM [phone]

## 2016-01-27 NOTE — Care Management Note (Signed)
Case Management Note  Patient Details  Name: Valerie [name] MRN: [id number] Date of Birth: [birth year]  Subjective/Objective:  Spoke to [employer] rep Ricki-faxed w/confirmation demogrpahics, H&P, op note, surgery notes, WOCN note.If [employer] wound vac for home to be used will put [employer] form on chart for Surgery signature.[facility] already following, awaiting HHC orders.Patient declines SNF, wants home w/HHC.  May need ambulance transp @ d/c, but patient will discuss d/c plans w/her spouse.                   Action/Plan:d/c plan home w/HHC.   Expected Discharge Date:                  Expected Discharge Plan:  Home w Home Health Services  In-House Referral:  NA  Discharge planning Services  CM Consult  Post Acute Care Choice:    Choice offered to:     DME Arranged:    DME Agency:     HH Arranged:  RN, OT, Nurse's Aide, PT HH Agency:  [facility]  Status of Service:  In process, will continue to follow  Medicare Important Message Given:  Yes Date Medicare IM Given:    Medicare IM give by:    Date Additional Medicare IM Given:    Additional Medicare Important Message give by:     If discussed at Long Length of Stay Meetings, dates discussed:    Additional Comments:  Lanier Clam, RN 01/27/2016, 1:04 PM

## 2016-01-28 DIAGNOSIS — E1121 Type 2 diabetes mellitus with diabetic nephropathy: Secondary | ICD-10-CM

## 2016-01-28 LAB — CBC
HCT: 26 % — ABNORMAL LOW (ref 36.0–46.0)
Hemoglobin: 7.9 g/dL — ABNORMAL LOW (ref 12.0–15.0)
MCH: 28.2 pg (ref 26.0–34.0)
MCHC: 30.4 g/dL (ref 30.0–36.0)
MCV: 92.9 fL (ref 78.0–100.0)
PLATELETS: 200 10*3/uL (ref 150–400)
RBC: 2.8 MIL/uL — AB (ref 3.87–5.11)
RDW: 14.7 % (ref 11.5–15.5)
WBC: 4 10*3/uL (ref 4.0–10.5)

## 2016-01-28 LAB — MAGNESIUM: Magnesium: 1.5 mg/dL — ABNORMAL LOW (ref 1.7–2.4)

## 2016-01-28 LAB — BASIC METABOLIC PANEL
Anion gap: 8 (ref 5–15)
BUN: 11 mg/dL (ref 6–20)
CALCIUM: 8.6 mg/dL — AB (ref 8.9–10.3)
CO2: 31 mmol/L (ref 22–32)
CREATININE: 0.78 mg/dL (ref 0.44–1.00)
Chloride: 100 mmol/L — ABNORMAL LOW (ref 101–111)
GFR calc Af Amer: >60 mL/min (ref >60)
GFR calc non Af Amer: >60 mL/min (ref >60)
GLUCOSE: 136 mg/dL — AB (ref 65–99)
Potassium: 3.1 mmol/L — ABNORMAL LOW (ref 3.5–5.1)
Sodium: 139 mmol/L (ref 135–145)

## 2016-01-28 LAB — GLUCOSE, CAPILLARY
GLUCOSE-CAPILLARY: 153 mg/dL — AB (ref 65–99)
Glucose-Capillary: 140 mg/dL — ABNORMAL HIGH (ref 65–99)
Glucose-Capillary: 175 mg/dL — ABNORMAL HIGH (ref 65–99)

## 2016-01-28 MED ORDER — POTASSIUM CHLORIDE CRYS ER 20 MEQ PO TBCR
40.0000 meq | EXTENDED_RELEASE_TABLET | Freq: Once | ORAL | Status: AC
  Administered 2016-01-28: 40 meq via ORAL
  Filled 2016-01-28: qty 2

## 2016-01-28 MED ORDER — FUROSEMIDE 20 MG PO TABS
20.0000 mg | ORAL_TABLET | Freq: Every day | ORAL | Status: DC
  Administered 2016-01-29: 20 mg via ORAL
  Filled 2016-01-28 (×2): qty 1

## 2016-01-28 MED ORDER — MAGNESIUM OXIDE 400 (241.3 MG) MG PO TABS
400.0000 mg | ORAL_TABLET | Freq: Two times a day (BID) | ORAL | Status: DC
  Administered 2016-01-28 – 2016-01-30 (×4): 400 mg via ORAL
  Filled 2016-01-28 (×5): qty 1

## 2016-01-28 MED ORDER — AMOXICILLIN-POT CLAVULANATE 875-125 MG PO TABS
1.0000 | ORAL_TABLET | Freq: Two times a day (BID) | ORAL | Status: DC
  Administered 2016-01-28 – 2016-01-30 (×4): 1 via ORAL
  Filled 2016-01-28 (×5): qty 1

## 2016-01-28 MED ORDER — MAGNESIUM SULFATE 2 GM/50ML IV SOLN: 2.0000 g | Freq: Once | INTRAVENOUS | Status: DC

## 2016-01-28 NOTE — Plan of Care (Signed)
Problem: Activity: Goal: Risk for activity intolerance will decrease Outcome: Progressing Improving with mobility. Better today than yesterday.

## 2016-01-28 NOTE — Progress Notes (Signed)
Patient IV leaking tried to restart IV to give IV antibiotics and magnesium, patient/husband refused stated she does want  another IV and request the meds be changed to PO. Dr. Elisabeth Pigeon notified and stated she will make some changes.

## 2016-01-28 NOTE — Progress Notes (Signed)
Occupational Therapy Treatment Patient Details Name: Valerie [name] MRN: [id number] DOB: [birth year] Today's Date: 01/28/2016    History of present illness patient is 70 yo female admitted 01/14/16 is status post exploratory laparotomy, extensive debridement of skin and sq tissue and muscle, repair of incarcerated incisional hernia with biologic mesh, VAC application by Dr. Derrell Lolling 01/15/2016 for Incarcerated incisional hernias, multiple, with small bowel obstruction ischemic fat necrosis of skin and subcutaneous tissue and fascia. She has midline VAC placed and NG tube, managed by surgery.   OT comments  Pt progressing but does fatique quickly. Upgraded grooming goal to standing  Follow Up Recommendations  SNF (if pt refuses, HHOT)    Equipment Recommendations  3 in 1 bedside comode    Recommendations for Other Services      Precautions / Restrictions Precautions Precautions: Fall Precaution Comments: VAC, abdominal incision Restrictions Weight Bearing Restrictions: No       Mobility Bed Mobility               General bed mobility comments: oob  Transfers   Equipment used: Rolling walker (2 wheeled) Transfers: Sit to/from Stand Sit to Stand: Min assist         General transfer comment: cues for UE/LE placement    Balance                                   ADL       Grooming: Oral care;Set up;Sitting                   Toilet Transfer: Minimal assistance;Stand-pivot   Toileting- Clothing Manipulation and Hygiene: Total assistance;Sit to/from stand         General ADL Comments: Pt had urgency. Cues for UE placement/safety and management of lines      Vision                     Perception     Praxis      Cognition   Behavior During Therapy: [facility] for tasks assessed/performed Overall Cognitive Status: Within Functional Limits for tasks assessed                       Extremity/Trunk Assessment                Exercises     Shoulder Instructions       General Comments      Pertinent Vitals/ Pain       Pain Score: 6  Pain Location: everywhere Pain Intervention(s): Limited activity within patient's tolerance;Monitored during session;Repositioned  Home Living                                          Prior Functioning/Environment              Frequency Min 2X/week     Progress Toward Goals  OT Goals(current goals can now be found in the care plan section)  Progress towards OT goals: Progressing toward goals     Plan      Co-evaluation                 End of Session     Activity Tolerance Patient limited by fatigue   Patient Left in chair;with call bell/phone within reach  Nurse Communication          Time: 1610-9604 OT Time Calculation (min): 18 min  Charges: OT General Charges $OT Visit: 1 Procedure OT Treatments $Self Care/Home Management : 8-22 mins  Daray Polgar 01/28/2016, 9:28 AM   Marica Otter, OTR/L [phone] 01/28/2016

## 2016-01-28 NOTE — Care Management Note (Signed)
Case Management Note  Patient Details  Name: Valerie [name] MRN: [id number] Date of Birth: 01-17-1946  Subjective/Objective: Faxed w/confirmation to [employer]-rep Rickie-signed [employer] wound vac form-await f/u.Await HHC orders.                    Action/Plan:d/c home w/HHC.   Expected Discharge Date:                  Expected Discharge Plan:  Home w Home Health Services  In-House Referral:  NA  Discharge planning Services  CM Consult  Post Acute Care Choice:    Choice offered to:     DME Arranged:    DME Agency:     HH Arranged:  RN, OT, Nurse's Aide, PT, Social Work [employer] Agency:  [facility]  Status of Service:  In process, will continue to follow  Medicare Important Message Given:  Yes Date Medicare IM Given:    Medicare IM give by:    Date Additional Medicare IM Given:    Additional Medicare Important Message give by:     If discussed at Long Length of Stay Meetings, dates discussed:    Additional Comments:  Lanier Clam, RN 01/28/2016, 12:45 PM

## 2016-01-28 NOTE — Care Management Note (Signed)
Case Management Note  Patient Details  Name: Valerie [name] MRN: [id number] Date of Birth: [birth year]  Subjective/Objective: CM/SW went into rm to discuss d/c plans with patient/spouse.  Patient confirmed she wants to go home w/HHC,ambulance transp.  CM said Nursing home instead of Skilled Nursing Facility-spouse became upset that the words nursing home is not a word to tell an elderly person.CM/SW apologized. CM informed spouse about medicare IM notice-especially about the d/c order needed prior to contacting Medicare-spouse voiced understanding.  Also informed them that MD will let them know when she is medically stable for d/c.Patient/spouse voiced understanding.                 Action/Plan:d/c plan home w/HHC.   Expected Discharge Date:                  Expected Discharge Plan:  Home w Home Health Services  In-House Referral:  NA  Discharge planning Services  CM Consult  Post Acute Care Choice:    Choice offered to:     DME Arranged:    DME Agency:     HH Arranged:  RN, OT, Nurse's Aide, PT, Social Work [employer] Agency:  [facility]  Status of Service:  In process, will continue to follow  Medicare Important Message Given:  Yes Date Medicare IM Given:    Medicare IM give by:    Date Additional Medicare IM Given:    Additional Medicare Important Message give by:     If discussed at Long Length of Stay Meetings, dates discussed:    Additional Comments:  Lanier Clam, RN 01/28/2016, 4:14 PM

## 2016-01-28 NOTE — Progress Notes (Signed)
Patient ID: Valerie [name], female   DOB: February 22, 1946, 70 y.o.   MRN: [id number] [facility] PROGRESS NOTE  Valerie [name] WUJ:[id number] DOB: [birth year] DOA: 01/14/2016 PCP: Garth Schlatter, MD  Brief narrative:    70 y.o. female with past medical history significant for overactive bladder, chronic UTIs, necrotizing pancreatitis status post partial pancreatectomy, morbid obesity, type 2 diabetes mellitus, just recently discharged after being hospitalized for small bowel obstruction. Patient presented to [facility] 01/14/2016 with worsening nausea, vomiting and abdominal discomfort for past day prior to this admission. CT abdomen and pelvis on the admission demonstrated small bowel obstruction with proximal ileum entrapment in the ventral hernia.  Patient is status post exploratory laparotomy, LOA, extensive debridement of skin and sq tissue and muscle, repair of incarcerated incisional hernia with biologic mesh, VAC application by Dr. Derrell Lolling 01/15/2016 for Incarcerated incisional hernias, multiple, with small bowel obstruction ischemic fat necrosis of skin and subcutaneous tissue and fascia. She has midline VAC placed and NG tube, managed by surgery.  Major events since admission: 1/18 - ex lap by Dr. Derrell Lolling, SDU post op 1/21 - improving slowly, NGT in place  1/23 - allowing sips  1/24 - NGT out, advancing diet slowly  1/25 - CT chest with developing HCAP vs aspiration, multifocal PNA, added bread spectrum ABX   Assessment/Plan:    Principal problem: Exploratory laparotomy / repair of incarcerated incisional hernia / Leukocytosis  - CT abdomen and pelvis on the admission demonstrated small bowel obstruction with proximal ileum entrapment in the ventral hernia - Status post exploratory laparotomy, LOA, extensive debridement of skin and sq tissue and muscle, repair of incarcerated incisional hernia with biologic mesh, VAC application by Dr. Derrell Lolling 01/15/2016 for incarcerated  incisional hernias, multiple, with small bowel obstruction ischemic fat necrosis of skin and subcutaneous tissue and fascia.  - Continue VAC dressing changes MWF  Active Problems: Urinary tract infection, yeast  - Has had Klebsiella UTI during recent hospitalization. Urine culture on this admission growing yeast - Pt completed course of Fluconazole   Acute respiratory failure with hypoxia - 1/23 CXR with pulmonary vascular congestion and possibly developing multifocal PNA vs HCAP and aspiration  - Continue IV lasix 20 mg daily  - Patient's respiratory status remained stable  Acute renal failure superimposed on chronic kidney disease stage 4 - Baseline creatinine is 1.34 and on this admission acutely elevated at 2.15 likely in the setting of acute illness - Creatinine WNL  Hypomagnesemia / hypokalemia - Continue to supplement. Magnesium 1.5 this morning and potassium 3.1.  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 - Counseled on diet   DVT Prophylaxis  - Heparin subcutaneous   Code Status: Full.  Family Communication:  plan of care discussed with the patient Disposition Plan: Home versus SNF probably by 2/2  IV access:  Peripheral IV  Procedures and diagnostic studies:    Exploratory  laparotomy, LOA, extensive debridement of skin and sq tissue and muscle, repair of incarcerated  incisional hernia with biologic mesh, VAC application---Dr. Derrell Lolling 01/15/2016 for Incarcerated incisional hernias, multiple, with small bowel obstruction Ischemic fat necrosis of skin and subcutaneous tissue and fascia.  Ct Abdomen Pelvis Wo Contrast 01/14/2016 1. Small bowel obstruction related to entrapment of proximal ileum in a small ventral hernia. Proximal small bowel is dilated with multiple air-fluid levels measuring up to 5.6 cm in diameter. Distal small bowel is completely decompressed, and colon is largely decompressed as well. Emergent surgical consultation is strongly recommended. 2. Extensive airspace disease in the lung bases bilaterally (left greater than right), concerning for potential aspiration pneumonia. 3. Cholelithiasis without evidence of acute cholecystitis at this time. 4. Chronic mild right hydronephrosis with urothelial thickening in the right renal collecting system, poorly evaluated on today's noncontrast CT examination, but similar in retrospect to prior studies. Nonemergent Urology consultation is recommended for further evaluation. 5. Atherosclerosis, including right coronary artery disease. 6. There are calcifications of the mitral valve and mitral annulus. Echocardiographic correlation for evaluation of potential valvular dysfunction may be warranted if clinically indicated. 7. Additional incidental findings, as above. Electronically Signed By: Trudie Reed M.D. On: 01/14/2016 22:13   Dg Chest 1 View 01/20/2016 1. Cardiomegaly with mild pulmonary vascular congestion. 2. There is a 2.7 cm nodular opacity in the right midlung most concerning for pneumonia.   Ct Chest Wo Contrast 01/21/2016 Multifocal airspace process worse over the right upper lobe and left base likely multifocal pneumonia and likely secondary to aspiration with small amount of aspirate material noted within the left posterior aspect of the mid to distal trachea. Small left pleural effusion. Minimal  reactive mediastinal adenopathy. Stable cardiomegaly with severe calcification of the mitral valve annulus. 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   Medical Consultants:  Surgery   Other Consultants:  PT WOC DM coordinator  IAnti-Infectives:   Vanco ans zosyn 01/15/2016 --> 1/20 Rocephin 1/20 --> 1/25 Vanc resumed 1/25 for HCAP coverage -->  Maxipime 1/25 for HCAP coverage -->   Manson Passey, MD  [facility] Pager [phone]  Time spent in minutes: 15 minutes  If 7PM-7AM, please contact night-coverage [email or web address] Password [facility] 01/28/2016, 12:23 PM   LOS: 14 days    HPI/Subjective: No acute overnight events. Patient crying says she can't sit in the chair.  Objective: Filed Vitals:   01/27/16 0408 01/27/16 2156 01/28/16 0709 01/28/16 0820  BP: 135/65 111/42 120/48   Pulse: 94 81 76   Temp: 98.6 F (37 C) 98.4 F (36.9 C) 97.7 F (36.5 C)   TempSrc: Oral Oral Oral   Resp: Height:      Weight: 272 lb 6.4 oz (123.56 kg)     SpO2: 99% 99% 99% 94%    Intake/Output Summary (Last 24 hours) at 01/28/16 1223 Last data filed at 01/28/16 1110  Gross per 24 hour  Intake    700 ml  Output   1050 ml  Net   -350 ml    Exam:   General:  Pt is awake, no distress   Cardiovascular: Rate controlled, appreciate S1-S2  Respiratory: No wheezing, no rhonchi  Abdomen: Appreciate bowel sounds, tender in mid abdomen without guarding  Extremities: No swelling, bilateral pulses  Neuro: No focal deficits  Data Reviewed: Basic Metabolic Panel:  Recent Labs Lab 01/22/16 0330 01/23/16 0350 01/24/16 0520 01/25/16 0543 01/28/16 0555  NA 140  138 137 142 139  K 3.6 3.6 3.4* 4.1 3.1*  CL 103 100* 101 102 100*  CO2 GLUCOSE 212* 323* 215* 301* 136*  BUN CREATININE 0.52 0.71 0.58 0.85 0.78  CALCIUM 8.4* 8.5* 8.3* 8.9 8.6*  MG 1.6* 1.7 1.8 1.7 1.5*   Liver Function  Tests: No results for input(s): AST, ALT, ALKPHOS, BILITOT, PROT, ALBUMIN in the last 168 hours. No results for input(s): LIPASE, AMYLASE in the last 168 hours. No results for input(s): AMMONIA in the last 168 hours. CBC:  Recent Labs Lab 01/22/16 0330 01/23/16 0350 01/24/16 0520 01/25/16 0543 01/28/16 0555  WBC 4.9 4.4 5.2 4.4 4.0  HGB 8.2* 8.4* 8.3* 8.6* 7.9*  HCT 26.9* 28.1* 27.7* 29.2* 26.0*  MCV 92.8 92.4 92.0 94.2 92.9  PLT 249 233 226 248 200   Cardiac Enzymes: No results for input(s): CKTOTAL, CKMB, CKMBINDEX, TROPONINI in the last 168 hours. BNP: Invalid input(s): POCBNP CBG:  Recent Labs Lab 01/27/16 1152 01/27/16 1631 01/27/16 2154 01/28/16 0734 01/28/16 1154  GLUCAP 197* 163* 137* 140* 175*    No results found for this or any previous visit (from the past 240 hour(s)).   Scheduled Meds: . antiseptic oral rinse  7 mL Mouth Rinse q12n4p  . ceFEPime (MAXIPIME) IV  2 g Intravenous 3 times per day  . chlorhexidine  15 mL Mouth Rinse BID  . fluticasone  2 spray Each Nare Daily  . furosemide  20 mg Intravenous Daily  . heparin  5,000 Units Subcutaneous 3 times per day  . insulin aspart  0-20 Units Subcutaneous TID WC  . insulin glargine  20 Units Subcutaneous QHS  . methocarbamol  1,000 mg Oral TID  . pantoprazole  40 mg Oral Daily  . polyethylene glycol  17 g Oral Daily  . senna-docusate  1 tablet Oral BID  . vancomycin  1,250 mg Intravenous Q24H

## 2016-01-28 NOTE — Care Management Important Message (Signed)
Important Message  Patient Details  Name: [name] MRN: [id number] Date of Birth: [birth year]   Medicare Important Message Given:  Yes    Haskell Flirt 01/28/2016, 9:21 AMImportant Message  Patient Details  Name: [name] MRN: [id number] Date of Birth: 10/17/1946   Medicare Important Message Given:  Yes    Haskell Flirt 01/28/2016, 9:21 AM

## 2016-01-28 NOTE — Progress Notes (Signed)
[facility] Progress Note  13 Days Post-Op  Subjective: Pt doing okay, but doesn't want to go home, but also doesn't want to go to SNF.  She feels she should stay her until she feels ready to go home.  She's tolerating diet.  Having BM's and urinating well.  VAC changes going well.  Doing much better with therapies.    Objective: Vital signs in last 24 hours: Temp:  [97.7 F (36.5 C)-98.4 F (36.9 C)] 97.7 F (36.5 C) (01/31 0709) Pulse Rate:  [76-81] 76 (01/31 0709) Resp:  [18-20] 18 (01/31 0709) BP: (111-120)/(42-48) 120/48 mmHg (01/31 0709) SpO2:  [94 %-99 %] 94 % (01/31 0820) Last BM Date: 01/28/16  Intake/Output from previous day: 01/30 0701 - 01/31 0700 In: 400 [IV Piggyback:400] Out: 800 [Urine:800] Intake/Output this shift: Total I/O In: 300 [P.O.:300] Out: 650 [Urine:650]  PE: Gen:  Alert, NAD, pleasant Abd: Soft, NT/ND, +BS, no HSM, wound vac in place with clear serosang drainage, staples removed   Lab Results:   Recent Labs  01/28/16 0555  WBC 4.0  HGB 7.9*  HCT 26.0*  PLT 200   BMET  Recent Labs  01/28/16 0555  NA 139  K 3.1*  CL 100*  CO2 31  GLUCOSE 136*  BUN 11  CREATININE 0.78  CALCIUM 8.6*   PT/INR No results for input(s): LABPROT, INR in the last 72 hours. CMP     Component Value Date/Time   NA 139 01/28/2016 0555   K 3.1* 01/28/2016 0555   CL 100* 01/28/2016 0555   CO2 31 01/28/2016 0555   GLUCOSE 136* 01/28/2016 0555   BUN 11 01/28/2016 0555   CREATININE 0.78 01/28/2016 0555   CREATININE 1.27* 03/07/2014 0945   CALCIUM 8.6* 01/28/2016 0555   PROT 6.5 01/15/2016 0142   ALBUMIN 2.7* 01/15/2016 0142   AST 16 01/15/2016 0142   ALT 13* 01/15/2016 0142   ALKPHOS 61 01/15/2016 0142   BILITOT 1.1 01/15/2016 0142   GFRNONAA >60 01/28/2016 0555   GFRAA >60 01/28/2016 0555   Lipase     Component Value Date/Time   LIPASE 24 01/14/2016 2029       Studies/Results: No results  found.  Anti-infectives: Anti-infectives    Start     Dose/Rate Route Frequency Ordered Stop   01/27/16 0200  vancomycin (VANCOCIN) 1,250 mg in sodium chloride 0.9 % 250 mL IVPB     1,250 mg 166.7 mL/hr over 90 Minutes Intravenous Every 24 hours 01/26/16 1429 01/30/16 2300   01/23/16 0200  vancomycin (VANCOCIN) 1,250 mg in sodium chloride 0.9 % 250 mL IVPB  Status:  Discontinued     1,250 mg 166.7 mL/hr over 90 Minutes Intravenous Every 12 hours 01/22/16 1323 01/26/16 1429   01/22/16 1400  ceFEPIme (MAXIPIME) 1 g in dextrose 5 % 50 mL IVPB  Status:  Discontinued     1 g 100 mL/hr over 30 Minutes Intravenous 3 times per day 01/22/16 1303 01/22/16 1344   01/22/16 1400  ceFEPIme (MAXIPIME) 2 g in dextrose 5 % 50 mL IVPB     2 g 100 mL/hr over 30 Minutes Intravenous 3 times per day 01/22/16 1344 01/30/16 1359   01/22/16 1330  vancomycin (VANCOCIN) 2,500 mg in sodium chloride 0.9 % 500 mL IVPB     2,500 mg 250 mL/hr over 120 Minutes Intravenous  Once 01/22/16 1323 01/22/16 1543   01/17/16 1700  cefTRIAXone (ROCEPHIN) 2 g in dextrose 5 % 50 mL IVPB  Comments:  Pharmacy may adjust dosing strength / duration / interval for maximal efficacy   2 g 100 mL/hr over 30 Minutes Intravenous Every 24 hours 01/17/16 1625 01/18/16 1703   01/17/16 1200  fluconazole (DIFLUCAN) IVPB 100 mg  Status:  Discontinued     100 mg 50 mL/hr over 60 Minutes Intravenous Every 24 hours 01/17/16 1019 01/23/16 1303   01/17/16 0600  vancomycin (VANCOCIN) 1,750 mg in sodium chloride 0.9 % 500 mL IVPB  Status:  Discontinued     1,750 mg 250 mL/hr over 120 Minutes Intravenous Every 48 hours 01/15/16 0146 01/16/16 1126   01/16/16 1300  vancomycin (VANCOCIN) 1,500 mg in sodium chloride 0.9 % 500 mL IVPB  Status:  Discontinued     1,500 mg 250 mL/hr over 120 Minutes Intravenous Every 24 hours 01/16/16 1125 01/17/16 1208   01/16/16 0600  cefoTEtan (CEFOTAN) 2 g in dextrose 5 % 50 mL IVPB  Status:  Discontinued     2 g 100  mL/hr over 30 Minutes Intravenous On call to O.R. 01/15/16 1700 01/15/16 1709   01/15/16 1130  cefTRIAXone (ROCEPHIN) 1 g in dextrose 5 % 50 mL IVPB  Status:  Discontinued     1 g 100 mL/hr over 30 Minutes Intravenous Every 24 hours 01/15/16 1123 01/15/16 1124   01/15/16 0200  piperacillin-tazobactam (ZOSYN) IVPB 3.375 g  Status:  Discontinued     3.375 g 12.5 mL/hr over 240 Minutes Intravenous 3 times per day 01/15/16 0049 01/17/16 1625   01/15/16 0130  vancomycin (VANCOCIN) 2,500 mg in sodium chloride 0.9 % 500 mL IVPB     2,500 mg 250 mL/hr over 120 Minutes Intravenous  Once 01/15/16 0055 01/15/16 0342   01/14/16 2230  cefTRIAXone (ROCEPHIN) 1 g in dextrose 5 % 50 mL IVPB     1 g 100 mL/hr over 30 Minutes Intravenous  Once 01/14/16 2226 01/14/16 2326       Assessment/Plan Incarcerated incisional hernias, multiple, with small 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 Derrell Lolling in 2-3 weeks, will arrange time/date    LOS: 14 days    Nonie Hoyer 01/28/2016, 12:24 PM Pager: [phone]

## 2016-01-29 LAB — BASIC METABOLIC PANEL
Anion gap: 9 (ref 5–15)
BUN: 11 mg/dL (ref 6–20)
CO2: 29 mmol/L (ref 22–32)
CREATININE: 0.81 mg/dL (ref 0.44–1.00)
Calcium: 8.7 mg/dL — ABNORMAL LOW (ref 8.9–10.3)
Chloride: 100 mmol/L — ABNORMAL LOW (ref 101–111)
GFR calc Af Amer: >60 mL/min (ref >60)
Glucose, Bld: 208 mg/dL — ABNORMAL HIGH (ref 65–99)
POTASSIUM: 3.6 mmol/L (ref 3.5–5.1)
SODIUM: 138 mmol/L (ref 135–145)

## 2016-01-29 LAB — CBC
HEMATOCRIT: 27.6 % — AB (ref 36.0–46.0)
Hemoglobin: 8.4 g/dL — ABNORMAL LOW (ref 12.0–15.0)
MCH: 27.9 pg (ref 26.0–34.0)
MCHC: 30.4 g/dL (ref 30.0–36.0)
MCV: 91.7 fL (ref 78.0–100.0)
Platelets: 214 10*3/uL (ref 150–400)
RBC: 3.01 MIL/uL — ABNORMAL LOW (ref 3.87–5.11)
RDW: 15 % (ref 11.5–15.5)
WBC: 4.2 10*3/uL (ref 4.0–10.5)

## 2016-01-29 LAB — GLUCOSE, CAPILLARY
GLUCOSE-CAPILLARY: 127 mg/dL — AB (ref 65–99)
GLUCOSE-CAPILLARY: 173 mg/dL — AB (ref 65–99)
Glucose-Capillary: 212 mg/dL — ABNORMAL HIGH (ref 65–99)
Glucose-Capillary: 184 mg/dL — ABNORMAL HIGH (ref 65–99)
Glucose-Capillary: 157 mg/dL — ABNORMAL HIGH (ref 65–99)

## 2016-01-29 LAB — MAGNESIUM: Magnesium: 1.6 mg/dL — ABNORMAL LOW (ref 1.7–2.4)

## 2016-01-29 MED ORDER — MAGNESIUM SULFATE 2 GM/50ML IV SOLN: 2.0000 g | Freq: Once | INTRAVENOUS | Status: DC

## 2016-01-29 MED ORDER — POTASSIUM CHLORIDE CRYS ER 20 MEQ PO TBCR
20.0000 meq | EXTENDED_RELEASE_TABLET | Freq: Every day | ORAL | Status: DC
  Administered 2016-01-29 – 2016-01-30 (×2): 20 meq via ORAL
  Filled 2016-01-29 (×2): qty 1

## 2016-01-29 MED ORDER — GLUCERNA SHAKE PO LIQD
237.0000 mL | Freq: Two times a day (BID) | ORAL | Status: DC
  Filled 2016-01-29 (×3): qty 237

## 2016-01-29 NOTE — Progress Notes (Signed)
[facility] Progress Note  14 Days Post-Op  Subjective: Patient is emotional today.  Tolerated VAC change well today.  Says her appetite isn't good, but tolerating diet.  Mobilizing with therapies.  Wants to go home eventually, but would rather stay in the hospital.  Objective: Vital signs in last 24 hours: Temp:  [98 F (36.7 C)-98.9 F (37.2 C)] 98 F (36.7 C) (02/01 0600) Pulse Rate:  [81-96] 81 (02/01 0600) Resp:  [20] 20 (02/01 0600) BP: (106-137)/(55-65) 137/65 mmHg (02/01 0600) SpO2:  [94 %-100 %] 100 % (02/01 0600) Last BM Date: 01/28/16  Intake/Output from previous day: 01/31 0701 - 02/01 0700 In: 420 [P.O.:420] Out: 1050 [Urine:1050] Intake/Output this shift:    PE: Gen: Alert, NAD, pleasant Abd: Soft, NT/ND, +BS, no HSM, wound vac removed, large triangular open wound with transitional mesh in base of wound, good granulation tissue formation and wound is filling in significantly, staples discontinued monday   01/29/16 VAC change.  Right of picture is superior, Bottom of picture is the patients left abdomen   Lab Results:   Recent Labs  01/28/16 0555 01/29/16 0553  WBC 4.0 4.2  HGB 7.9* 8.4*  HCT 26.0* 27.6*  PLT 200 214   BMET  Recent Labs  01/28/16 0555 01/29/16 0553  NA 139 138  K 3.1* 3.6  CL 100* 100*  CO2 31 29  GLUCOSE 136* 208*  BUN 11 11  CREATININE 0.78 0.81  CALCIUM 8.6* 8.7*   PT/INR No results for input(s): LABPROT, INR in the last 72 hours. CMP     Component Value Date/Time   NA 138 01/29/2016 0553   K 3.6 01/29/2016 0553   CL 100* 01/29/2016 0553   CO2 29 01/29/2016 0553   GLUCOSE 208* 01/29/2016 0553   BUN 11 01/29/2016 0553   CREATININE 0.81 01/29/2016 0553   CREATININE 1.27* 03/07/2014 0945   CALCIUM 8.7* 01/29/2016 0553   PROT 6.5 01/15/2016 0142   ALBUMIN 2.7* 01/15/2016 0142   AST 16 01/15/2016 0142   ALT 13* 01/15/2016 0142   ALKPHOS 61 01/15/2016 0142   BILITOT 1.1 01/15/2016 0142   GFRNONAA  >60 01/29/2016 0553   GFRAA >60 01/29/2016 0553   Lipase     Component Value Date/Time   LIPASE 24 01/14/2016 2029       Studies/Results: No results found.  Anti-infectives: Anti-infectives    Start     Dose/Rate Route Frequency Ordered Stop   01/28/16 1700  amoxicillin-clavulanate (AUGMENTIN) 875-125 MG per tablet 1 tablet     1 tablet Oral 2 times daily with meals 01/28/16 1633     01/27/16 0200  vancomycin (VANCOCIN) 1,250 mg in sodium chloride 0.9 % 250 mL IVPB  Status:  Discontinued     1,250 mg 166.7 mL/hr over 90 Minutes Intravenous Every 24 hours 01/26/16 1429 01/28/16 1633   01/23/16 0200  vancomycin (VANCOCIN) 1,250 mg in sodium chloride 0.9 % 250 mL IVPB  Status:  Discontinued     1,250 mg 166.7 mL/hr over 90 Minutes Intravenous Every 12 hours 01/22/16 1323 01/26/16 1429   01/22/16 1400  ceFEPIme (MAXIPIME) 1 g in dextrose 5 % 50 mL IVPB  Status:  Discontinued     1 g 100 mL/hr over 30 Minutes Intravenous 3 times per day 01/22/16 1303 01/22/16 1344   01/22/16 1400  ceFEPIme (MAXIPIME) 2 g in dextrose 5 % 50 mL IVPB  Status:  Discontinued     2 g 100 mL/hr over 30 Minutes  Intravenous 3 times per day 01/22/16 1344 01/28/16 1633   01/22/16 1330  vancomycin (VANCOCIN) 2,500 mg in sodium chloride 0.9 % 500 mL IVPB     2,500 mg 250 mL/hr over 120 Minutes Intravenous  Once 01/22/16 1323 01/22/16 1543   01/17/16 1700  cefTRIAXone (ROCEPHIN) 2 g in dextrose 5 % 50 mL IVPB    Comments:  Pharmacy may adjust dosing strength / duration / interval for maximal efficacy   2 g 100 mL/hr over 30 Minutes Intravenous Every 24 hours 01/17/16 1625 01/18/16 1703   01/17/16 1200  fluconazole (DIFLUCAN) IVPB 100 mg  Status:  Discontinued     100 mg 50 mL/hr over 60 Minutes Intravenous Every 24 hours 01/17/16 1019 01/23/16 1303   01/17/16 0600  vancomycin (VANCOCIN) 1,750 mg in sodium chloride 0.9 % 500 mL IVPB  Status:  Discontinued     1,750 mg 250 mL/hr over 120 Minutes Intravenous  Every 48 hours 01/15/16 0146 01/16/16 1126   01/16/16 1300  vancomycin (VANCOCIN) 1,500 mg in sodium chloride 0.9 % 500 mL IVPB  Status:  Discontinued     1,500 mg 250 mL/hr over 120 Minutes Intravenous Every 24 hours 01/16/16 1125 01/17/16 1208   01/16/16 0600  cefoTEtan (CEFOTAN) 2 g in dextrose 5 % 50 mL IVPB  Status:  Discontinued     2 g 100 mL/hr over 30 Minutes Intravenous On call to O.R. 01/15/16 1700 01/15/16 1709   01/15/16 1130  cefTRIAXone (ROCEPHIN) 1 g in dextrose 5 % 50 mL IVPB  Status:  Discontinued     1 g 100 mL/hr over 30 Minutes Intravenous Every 24 hours 01/15/16 1123 01/15/16 1124   01/15/16 0200  piperacillin-tazobactam (ZOSYN) IVPB 3.375 g  Status:  Discontinued     3.375 g 12.5 mL/hr over 240 Minutes Intravenous 3 times per day 01/15/16 0049 01/17/16 1625   01/15/16 0130  vancomycin (VANCOCIN) 2,500 mg in sodium chloride 0.9 % 500 mL IVPB     2,500 mg 250 mL/hr over 120 Minutes Intravenous  Once 01/15/16 0055 01/15/16 0342   01/14/16 2230  cefTRIAXone (ROCEPHIN) 1 g in dextrose 5 % 50 mL IVPB     1 g 100 mL/hr over 30 Minutes Intravenous  Once 01/14/16 2226 01/14/16 2326       Assessment/Plan Incarcerated incisional hernias, multiple, with small 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 Derrell Lolling in 2-3 weeks, will arrange time/date             Will sign off, call  with questions/concerns   LOS: 15 days    Nonie Hoyer 01/29/2016, 10:10 AM Pager: [phone]

## 2016-01-29 NOTE — Progress Notes (Signed)
OT Cancellation Note  Patient Details Name: Kylynn Street MRN: [id number] DOB: Sep 16, 1946   Cancelled Treatment:    Reason Eval/Treat Not Completed: Other (comment).  Pt doesn't feel up to OT/PT this afternoon.  Plan is for SNF tomorrow.  If she doesn't d/c, we will check back another day.  SPENCER,MARYELLEN 01/29/2016, 3:39 PM  Marica Otter, OTR/L [phone] 01/29/2016   Rebeca Alert, MPT [phone]

## 2016-01-29 NOTE — Care Management Note (Signed)
Case Management Note  Patient Details  Name: Valerie [name] MRN: [id number] Date of Birth: November 05, 1946  Subjective/Objective:Spoke to Director of floor-recc to discuss SNF options again-an interdisciplinary team approach-CSW notified.CM has contacted [employer] rep Rickie to be on standby if d/c plans are for home.[facility] on standby if plans are for home.                    Action/Plan:d/c home/snf.   Expected Discharge Date:                  Expected Discharge Plan:  Home w Home Health Services  In-House Referral:  NA  Discharge planning Services  CM Consult  Post Acute Care Choice:    Choice offered to:     DME Arranged:    DME Agency:     HH Arranged:  RN, OT, Nurse's Aide, PT, Social Work [employer] Agency:  [facility]  Status of Service:  In process, will continue to follow  Medicare Important Message Given:  Yes Date Medicare IM Given:    Medicare IM give by:    Date Additional Medicare IM Given:    Additional Medicare Important Message give by:     If discussed at Long Length of Stay Meetings, dates discussed:    Additional Comments:  Lanier Clam, RN 01/29/2016, 10:07 AM

## 2016-01-29 NOTE — Clinical Social Work Placement (Signed)
CSW received consult from 4th Floor Director, Macon Large that patient's husband is interested in SNF. CSW confirmed with Dr. Elisabeth Pigeon that patient is agreeable with idea. CSW sent information out to [facility] & called husband with bed offers. Husband had toured [facility] and would like her to go there. Anticipating discharge tomorrow - Thursday, 2/2.    Lincoln Maxin, LCSW [facility] Clinical Social Worker cell #: [phone]     CLINICAL SOCIAL WORK PLACEMENT  NOTE  Date:  01/29/2016  Patient Details  Name: Valerie [name] MRN: [id number] Date of Birth: May 08, 1946  Clinical Social Work is seeking post-discharge placement for this patient at the Skilled  Nursing Facility level of care (*CSW will initial, date and re-position this form in  chart as items are completed):  Yes   Patient/family provided with [facility] Clinical Social Work Department's list of facilities offering this level of care within the geographic area requested by the patient (or if unable, by the patient's family).  Yes   Patient/family informed of their freedom to choose among providers that offer the needed level of care, that participate in Medicare, Medicaid or managed care program needed by the patient, have an available bed and are willing to accept the patient.  Yes   Patient/family informed of [facility]'s ownership interest in [facility] and [facility], as well as of the fact that they are under no obligation to receive care at these facilities.  PASRR submitted to EDS on 01/29/16     PASRR number received on 01/29/16     Existing PASRR number confirmed on       FL2 transmitted to all facilities in geographic area requested by pt/family on 01/29/16     FL2 transmitted to all facilities within larger geographic area on       Patient informed that his/her managed care company has contracts with or will negotiate with certain facilities, including the following:         Yes   Patient/family informed of bed offers received.  Patient chooses bed at [facility]     Physician recommends and patient chooses bed at      Patient to be transferred to [facility] on  .  Patient to be transferred to facility by       Patient family notified on   of transfer.  Name of family member notified:        PHYSICIAN       Additional Comment:    _______________________________________________ Arlyss Repress, LCSW 01/29/2016, 3:10 PM

## 2016-01-29 NOTE — Progress Notes (Addendum)
Patient ID: Valerie [name], female   DOB: 04-26-1946, 70 y.o.   MRN: [id number] [facility] PROGRESS NOTE  Valerie [name] WUJ:[id number] DOB: 02/13/1946 DOA: 01/14/2016 PCP: Garth Schlatter, MD  Brief narrative:    70 y.o. female with past medical history significant for overactive bladder, chronic UTIs, necrotizing pancreatitis status post partial pancreatectomy, morbid obesity, type 2 diabetes mellitus, just recently discharged after being hospitalized for small bowel obstruction. Patient presented to [facility] 01/14/2016 with worsening nausea, vomiting and abdominal discomfort for past day prior to this admission. CT abdomen and pelvis on the admission demonstrated small bowel obstruction with proximal ileum entrapment in the ventral hernia.  Patient is status post exploratory laparotomy, LOA, extensive debridement of skin and sq tissue and muscle, repair of incarcerated incisional hernia with biologic mesh, VAC application by Dr. Derrell Lolling 01/15/2016 for Incarcerated incisional hernias, multiple, with small bowel obstruction ischemic fat necrosis of skin and subcutaneous tissue and fascia. She has midline VAC placed and NG tube, managed by surgery.  Major events since admission: 1/18 - ex lap by Dr. Derrell Lolling, SDU post op 1/21 - improving slowly, NGT in place  1/23 - allowing sips  1/24 - NGT out, advancing diet slowly  1/25 - CT chest with developing HCAP vs aspiration, multifocal PNA, added bread spectrum ABX   Assessment/Plan:    Principal problem: Exploratory laparotomy / repair of incarcerated incisional hernia / Leukocytosis  - CT abdomen and pelvis on the admission demonstrated small bowel obstruction with proximal ileum entrapment in the ventral hernia - Status post exploratory laparotomy, LOA, extensive debridement of skin and sq tissue and muscle, repair of incarcerated incisional hernia with biologic mesh, VAC application by Dr. Derrell Lolling 01/15/2016 for incarcerated  incisional hernias, multiple, with small bowel obstruction ischemic fat necrosis of skin and subcutaneous tissue and fascia.  - Continue VAC dressing changes MWF - Antibiotic changed to Augmentin today  Active Problems: Urinary tract infection, yeast  - Has had Klebsiella UTI during recent hospitalization. Urine culture on this admission growing yeast - Pt completed course of Fluconazole   Acute respiratory failure with hypoxia - 1/23 CXR with pulmonary vascular congestion and possibly developing multifocal PNA vs HCAP and aspiration  - Continue Lasix 20 mg daily  Acute renal failure superimposed on chronic kidney disease stage 4 - Baseline creatinine is 1.34 and on this admission acutely elevated at 2.15 likely in the setting of acute illness - Creatinine WNL  Hypomagnesemia / hypokalemia - Continue to supplement magnesium because it is 1.6 this morning. Potassium is within normal limits.  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 - Counseled on diet   DVT Prophylaxis  - Heparin subQ ordered    Code Status: Full.  Family Communication:  plan of care discussed with the patient Disposition Plan: to SNF 2/2  IV access:  Peripheral IV  Procedures and diagnostic studies:    Exploratory laparotomy, LOA, extensive  debridement of skin and sq tissue and muscle, repair of incarcerated incisional hernia with  biologic mesh, VAC application---Dr. Derrell Lolling 01/15/2016 for Incarcerated incisional hernias, multiple, with small bowel obstruction Ischemic fat necrosis of skin and subcutaneous tissue and fascia.  Ct Abdomen Pelvis Wo Contrast 01/14/2016 1. Small bowel obstruction related to entrapment of proximal ileum in a small ventral hernia. Proximal small bowel is dilated with multiple air-fluid levels measuring up to 5.6 cm in diameter. Distal small bowel is completely decompressed, and colon is largely decompressed as well. Emergent surgical consultation is strongly recommended. 2. Extensive airspace disease in the lung bases bilaterally (left greater than right), concerning for potential aspiration pneumonia. 3. Cholelithiasis without evidence of acute cholecystitis at this time. 4. Chronic mild right hydronephrosis with urothelial thickening in the right renal collecting system, poorly evaluated on today's noncontrast CT examination, but similar in retrospect to prior studies. Nonemergent Urology consultation is recommended for further evaluation. 5. Atherosclerosis, including right coronary artery disease. 6. There are calcifications of the mitral valve and mitral annulus. Echocardiographic correlation for evaluation of potential valvular dysfunction may be warranted if clinically indicated. 7. Additional incidental findings, as above. Electronically Signed By: Trudie Reed M.D. On: 01/14/2016 22:13   Dg Chest 1 View 01/20/2016 1. Cardiomegaly with mild pulmonary vascular congestion. 2. There is a 2.7 cm nodular opacity in the right midlung most concerning for pneumonia.   Ct Chest Wo Contrast 01/21/2016 Multifocal airspace process worse over the right upper lobe and left base likely multifocal pneumonia and likely secondary to aspiration with small amount of aspirate material noted within the left posterior aspect of the mid to distal trachea. Small left pleural effusion. Minimal reactive mediastinal  adenopathy. Stable cardiomegaly with severe calcification of the mitral valve annulus. 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   Medical Consultants:  Surgery   Other Consultants:  PT WOC DM coordinator  IAnti-Infectives:   Vanco ans zosyn 01/15/2016 --> 1/20 Rocephin 1/20 --> 1/25 Vanc resumed 1/25 for HCAP coverage --> 01/29/16 Maxipime 1/25 for HCAP coverage --> 01/29/16 Augmentin 01/29/2016 -->    Manson Passey, MD  [facility] Pager [phone]  Time spent in minutes: 25 minutes  If 7PM-7AM, please contact night-coverage [email or web address] Password [email or web address] 01/29/2016, 11:25 AM   LOS: 15 days    HPI/Subjective: No acute overnight events.   Objective: Filed Vitals:   01/28/16 1500 01/28/16 2141 01/28/16 2346 01/29/16 0600  BP: 133/58 106/55  137/65  Pulse: 96 88  81  Temp: 98 F (36.7 C) 98.9 F (37.2 C)  98 F (36.7 C)  TempSrc: Oral Oral  Oral  Resp: Height:      Weight:      SpO2: 95% 94% 95% 100%    Intake/Output Summary (Last 24 hours) at 01/29/16 1125 Last data filed at 01/29/16 0355  Gross per 24 hour  Intake    120 ml  Output    400 ml  Net   -280 ml    Exam:   General:  Pt is not in acute distress  Cardiovascular: RRR, appreciate S1, S2   Respiratory: Bilateral air entry, no wheezing   Abdomen: Appreciate bowel sounds, non-tender, dressing in place  Extremities: No edema, appreciate bilateral pulses   Neuro: Nonfocal  Data Reviewed: Basic Metabolic Panel:  Recent Labs Lab 01/23/16 0350 01/24/16 0520 01/25/16 0543 01/28/16 0555 01/29/16 0553  NA 138 137 142 139 138  K 3.6 3.4* 4.1  3.1* 3.6  CL 100* 101 102 100* 100*  CO2 25 27 29 31 29   GLUCOSE 323* 215* 301* 136* 208*  BUN 12 10 11 11 11   CREATININE 0.71 0.58 0.85 0.78 0.81  CALCIUM 8.5* 8.3* 8.9 8.6* 8.7*  MG 1.7 1.8 1.7 1.5* 1.6*   Liver Function Tests: No results for input(s): AST, ALT, ALKPHOS, BILITOT, PROT,  ALBUMIN in the last 168 hours. No results for input(s): LIPASE, AMYLASE in the last 168 hours. No results for input(s): AMMONIA in the last 168 hours. CBC:  Recent Labs Lab 01/23/16 0350 01/24/16 0520 01/25/16 0543 01/28/16 0555 01/29/16 0553  WBC 4.4 5.2 4.4 4.0 4.2  HGB 8.4* 8.3* 8.6* 7.9* 8.4*  HCT 28.1* 27.7* 29.2* 26.0* 27.6*  MCV 92.4 92.0 94.2 92.9 91.7  PLT 233 226 248 200 214   Cardiac Enzymes: No results for input(s): CKTOTAL, CKMB, CKMBINDEX, TROPONINI in the last 168 hours. BNP: Invalid input(s): POCBNP CBG:  Recent Labs Lab 01/28/16 0734 01/28/16 1154 01/28/16 1637 01/28/16 2142 01/29/16 0948  GLUCAP 140* 175* 153* 127* 212*    No results found for this or any previous visit (from the past 240 hour(s)).   Scheduled Meds: . amoxicillin-clavulanate  1 tablet Oral BID WC  . antiseptic oral rinse  7 mL Mouth Rinse q12n4p  . chlorhexidine  15 mL Mouth Rinse BID  . fluticasone  2 spray Each Nare Daily  . furosemide  20 mg Oral Daily  . heparin  5,000 Units Subcutaneous 3 times per day  . insulin aspart  0-20 Units Subcutaneous TID WC  . insulin glargine  20 Units Subcutaneous QHS  . magnesium oxide  400 mg Oral BID  . methocarbamol  1,000 mg Oral TID  . pantoprazole  40 mg Oral Daily  . polyethylene glycol  17 g Oral Daily  . senna-docusate  1 tablet Oral BID

## 2016-01-29 NOTE — Progress Notes (Signed)
Was notified by [facility] yesterday that Ms Fatzinger had appealed her DC. Advised them this morning that DC has not been written and is not anticipated until tomorrow, 01/30/16.

## 2016-01-29 NOTE — NC FL2 (Signed)
[place] MEDICAID FL2 LEVEL OF CARE SCREENING TOOL     IDENTIFICATION  Patient Name: Valerie [name] Birthdate: [birth year] Sex: female Admission Date (Current Location): 01/14/2016  [facility] and [place] Number:  Producer, television/film/video and Address:  [facility],  [address] [place] [address]      Provider Number: [id number]  Attending Physician Name and Address:  Alison Murray, MD  Relative Name and Phone Number:       Current Level of Care: Hospital Recommended Level of Care: Skilled Nursing Facility Prior Approval Number:    Date Approved/Denied:   PASRR Number:   [id number]  Discharge Plan: SNF    Current Diagnoses: Patient Active Problem List   Diagnosis Date Noted  . Pressure ulcer 01/27/2016  . Hypomagnesemia 01/19/2016  . Small bowel obstruction (HCC) 01/17/2016  . Acute renal failure superimposed on stage 3 chronic kidney disease (HCC) 01/17/2016  . Anemia of chronic disease 01/17/2016  . Anemia associated with acute blood loss 01/17/2016  . Controlled type 2 diabetes mellitus with diabetic nephropathy, without long-term current use of insulin (HCC) 01/17/2016  . Morbid obesity due to excess calories (HCC) 01/17/2016  . Yeast UTI 01/17/2016  . Incarcerated hernia 01/17/2016  . Dyslipidemia associated with type 2 diabetes mellitus (HCC) 01/17/2016  . Lower urinary tract infection 09/25/2014  . Essential hypertension, benign 03/07/2014    Orientation RESPIRATION BLADDER Height & Weight     Self, Time, Situation, Place  Normal Continent Weight: 272 lb 6.4 oz (123.56 kg) Height:   (162.6 cm)  BEHAVIORAL SYMPTOMS/MOOD NEUROLOGICAL BOWEL NUTRITION STATUS      Continent Diet (carb modified)  AMBULATORY STATUS COMMUNICATION OF NEEDS Skin   Extensive Assist Verbally Wound Vac, Surgical wounds  (PressureUlcer01/28/17StageII-Partialthicknesslossofdermispresentingasashallowopenulcerwithared,pinkwoundbedwithoutslough.2cmshallowstage2Wound/Incision(OpenorDehisced)01/09/17DiabeticulcerToe(Commentwhichone)L) Left;Right;MedialdiabeticulcersonLandRfoot2nddigit  Wound/Incision(OpenorDehisced)01/09/17DiabeticulcerLeg  Incision(Closed)01/18/17Abdomen - see additional comments for wound care NegativePressureWoundTherapyAbdomenMedial               Personal Care Assistance Level of Assistance  Bathing, Dressing Bathing Assistance: Limited assistance   Dressing Assistance: Limited assistance     Functional Limitations Info             SPECIAL CARE FACTORS FREQUENCY  PT (By licensed PT), OT (By licensed OT)     PT Frequency: 5 OT Frequency: 5            Contractures      Additional Factors Info  Code Status, Allergies Code Status Info: fullcode Allergies Info: NKDA           Current Medications (01/29/2016):  This is the current hospital active medication list Current Facility-Administered Medications  Medication Dose Route Frequency Provider Last Rate Last Dose  . amoxicillin-clavulanate (AUGMENTIN) 875-125 MG per tablet 1 tablet  1 tablet Oral BID WC Alison Murray, MD   1 tablet at 01/29/16 [id number]  . antiseptic oral rinse (CPC / CETYLPYRIDINIUM CHLORIDE 0.05%) solution 7 mL  7 mL Mouth Rinse q12n4p Dorothea Ogle, MD   7 mL at 01/28/16 1722  . bisacodyl (DULCOLAX) suppository 10 mg  10 mg Rectal Daily PRN Dorothea Ogle, MD   10 mg at 01/23/16 1541  . chlorhexidine (PERIDEX) 0.12 % solution 15 mL  15 mL Mouth Rinse BID Dorothea Ogle, MD   15 mL at 01/29/16 0956  . fluticasone (FLONASE) 50 MCG/ACT nasal spray 2 spray  2 spray Each Nare Daily Eduard Clos, MD   2 spray at 01/29/16 0955  . furosemide (LASIX) tablet 20 mg  20 mg  Oral Daily Alison Murray, MD   20 mg at 01/29/16 4098  . heparin  injection 5,000 Units  5,000 Units Subcutaneous 3 times per day Claud Kelp, MD   5,000 Units at 01/29/16 0631  . HYDROmorphone (DILAUDID) injection 1 mg  1 mg Intravenous Q4H PRN Nonie Hoyer, PA-C   1 mg at 01/28/16 1218  . insulin aspart (novoLOG) injection 0-20 Units  0-20 Units Subcutaneous TID WC Dorothea Ogle, MD   7 Units at 01/29/16 1003  . insulin glargine (LANTUS) injection 20 Units  20 Units Subcutaneous QHS Dorothea Ogle, MD   20 Units at 01/28/16 2147  . LORazepam (ATIVAN) tablet 1 mg  1 mg Oral Q8H PRN Dorothea Ogle, MD   1 mg at 01/29/16 0212  . magnesium oxide (MAG-OX) tablet 400 mg  400 mg Oral BID Alison Murray, MD   400 mg at 01/29/16 0956  . methocarbamol (ROBAXIN) tablet 1,000 mg  1,000 mg Oral TID Nonie Hoyer, PA-C   1,000 mg at 01/29/16 1191  . ondansetron (ZOFRAN-ODT) disintegrating tablet 4 mg  4 mg Oral Q6H PRN Claud Kelp, MD       Or  . ondansetron [facility]) injection 4 mg  4 mg Intravenous Q6H PRN Claud Kelp, MD      . oxyCODONE (Oxy IR/ROXICODONE) immediate release tablet 5-10 mg  5-10 mg Oral Q4H PRN Nonie Hoyer, PA-C   5 mg at 01/29/16 4782  . pantoprazole (PROTONIX) EC tablet 40 mg  40 mg Oral Daily Darnell Level, MD   40 mg at 01/29/16 0956  . phenol (CHLORASEPTIC) mouth spray 1 spray  1 spray Mouth/Throat PRN Eduard Clos, MD      . polyethylene glycol (MIRALAX / GLYCOLAX) packet 17 g  17 g Oral Daily Dorothea Ogle, MD   17 g at 01/29/16 0955  . senna-docusate (Senokot-S) tablet 1 tablet  1 tablet Oral BID Dorothea Ogle, MD   1 tablet at 01/29/16 9562     Discharge Medications: Please see discharge summary for a list of discharge medications.  Relevant Imaging Results:  Relevant Lab Results:   Additional Information SSN: [id number]  Wound type:Open abdominal wound Measurement: per Wednesday, 01/22/16 Wound bed:red, moist with 5 x 7cm area in center exposing mesh Drainage (amount, consistency, odor) moderate serosanguinous in  cannister Periwound:intact Dressing procedure/placement/frequency:Replacement of VAC dressing after wound cleansed with NS and patted day. Staples removed per Dr. Arita Miss request. 1 piece of mepitel placed over exposed mesh, 1 pieces of black foam used to obliterate dead space and is tucked into mildly undermined areas at inferior margin. Seal immediately achieved at continuous negative pressure and tolerated well by patient.   Arlyss Repress, LCSW

## 2016-01-29 NOTE — Consult Note (Signed)
WOC wound follow up Wound type:Open abdominal wound Measurement:13.5cm x 22cm x 3cm  with 5cm x 7cm exposed mesh in the center Wound bed:red, moist Drainage (amount, consistency, odor) moderate amount of serosanguinous Periwound:intact Dressing procedure/placement/frequency:NPWT replaced following PA assessment and taking of photo.  1 piece of black Granufoam used to obliterate dead space after 1 piece of Mepitel silicone dressing used to cover mesh.  Dressing topped with drape and affixed to continuous negative pressure.  An immediate seal is achieved.  Patient tolerated procedure well. Next dressing change is due on Friday, February 3rd. WOC nursing team will follow, and will remain available to this patient, the nursing and medical teams.   Ladona Mow, MSN, RN, GNP, [place]  Pager# [phone]

## 2016-01-29 NOTE — Progress Notes (Signed)
Writer has attempted to talk with the patient several times today about her plan for discharge. Patient was informed that the plan was for discharge tomorrow. Every time the writer attempted to talk with the patient she either became silent and stated that she was not talking to any staff today about discharge, or she would start crying and still refuse to talk about what was bothering her. Writer encouraged the patient to talk about how she was feeling and offered emotional support. The patient was not receptive. The writer also encouraged the patient to ambulate today and she has refused to do so thus far. She did sit in the recliner for about 30-45 minutes today for lunch, but that is the only time she has been out of bed other than to use the bedside commode.   Arta [facility] 01/29/2016 5:01 PM

## 2016-01-29 NOTE — Progress Notes (Signed)
Nutrition Follow-up  DOCUMENTATION CODES:   Morbid obesity  INTERVENTION:  - Will order Glucerna Shake BID, each supplement provides 220 kcal and 10 grams of protein - RD will continue to monitor for needs  NUTRITION DIAGNOSIS:   Inadequate oral intake related to nausea, vomiting, poor appetite as evidenced by per patient/family report. -ongoing  GOAL:   Patient will meet greater than or equal to 90% of their needs -unmet  MONITOR:   PO intake, Diet advancement, Labs, I & O's, Skin, Weight trends  ASSESSMENT:   Valerie [name] is a 70 y.o. female with history of necrotizing pancreatitis, diabetes mellitus, hypertension who was discharged yesterday presents with complaints of nausea vomiting and abdominal discomfort. Patient has had multiple episodes of nausea and vomiting  2/1 Per chart review, pt ate 20% of breakfast and 10% of lunch yesterday (1/31). Pt reports that she did not receive her breakfast this AM and she went on to state that she did not order breakfast; pt denies needing assistance with ordering meals. She is preparing to order lunch at this time. Pt reports poor appetite with no associated nausea or abdominal pain. Pt reports appetite was fair PTA but that it has further declined since admission. She does not like the hospital food and states that items taste bland. Provided pt with some recommendations based off of reports from other pts of foods they have enjoyed but pt is not satisfied with any of these recommendations.  Will order Glucerna BID and monitor for tolerance/acceptance. Pt not meeting needs which may be partly food preference related. Medications reviewed. Labs reviewed; CBGs: 127-291 mg/dL, Cl: 161 mmol/L, Ca: 8.7 mg/dL, Mg: 1.6 mg/dL.   1/26 - Spoke with pt at bedside.  - Pt complains of poor appetite at this point in time.  - She was discharged on 1/17, before presenting back here the following day with nausea/vomitting/abd discomfort.  - She is s/p  exploratory laparotomy, LOA, debridgement of skin, repair of incarcerated incisional hernia and VAC application. - Currently, they have removed her NGT, but it is possible she is developing HCAP vs aspiration PNA.  - Pt is now on broad spectrum ABX. - Currently she is on full liquid diet.  - She is not really hungry, but said she will eat when she is ready.  - She is having BM's and flatus. - Nutrition-Focused physical exam completed.  - Findings are no fat depletion, no muscle depletion, and no edema.    Diet Order:  Diet Carb Modified Fluid consistency:: Thin; Room service appropriate?: Yes  Skin:  Wound (see comment) (Diabetic Ulcers to leg, foot, wound to abdomen.)  Last BM:  1/31  Height:   Ht Readings from Last 1 Encounters:  01/15/16  (1.626 m)    Weight:   Wt Readings from Last 1 Encounters:  01/27/16 272 lb 6.4 oz (123.56 kg)    Ideal Body Weight:  54.54 kg  BMI:  Body mass index is 46.73 kg/(m^2).  Estimated Nutritional Needs:   Kcal:  1400-1600  Protein:  70-85 grams  Fluid:  >/= 2L  EDUCATION NEEDS:   No education needs identified at this time     Trenton Gammon, RD, LDN Inpatient Clinical Dietitian Pager # [phone] After hours/weekend pager # [phone]

## 2016-01-30 LAB — BASIC METABOLIC PANEL
Anion gap: 10 (ref 5–15)
BUN: 12 mg/dL (ref 6–20)
CALCIUM: 9 mg/dL (ref 8.9–10.3)
CO2: 29 mmol/L (ref 22–32)
Chloride: 100 mmol/L — ABNORMAL LOW (ref 101–111)
Creatinine, Ser: 0.88 mg/dL (ref 0.44–1.00)
GFR calc Af Amer: >60 mL/min (ref >60)
GLUCOSE: 214 mg/dL — AB (ref 65–99)
Potassium: 3.7 mmol/L (ref 3.5–5.1)
SODIUM: 139 mmol/L (ref 135–145)

## 2016-01-30 LAB — GLUCOSE, CAPILLARY: Glucose-Capillary: 212 mg/dL — ABNORMAL HIGH (ref 65–99)

## 2016-01-30 LAB — MAGNESIUM: MAGNESIUM: 1.5 mg/dL — AB (ref 1.7–2.4)

## 2016-01-30 MED ORDER — FUROSEMIDE 20 MG PO TABS: 20.0000 mg | ORAL_TABLET | Freq: Every day | ORAL | Status: DC

## 2016-01-30 MED ORDER — POTASSIUM CHLORIDE CRYS ER 20 MEQ PO TBCR: 20.0000 meq | EXTENDED_RELEASE_TABLET | Freq: Every day | ORAL | Status: DC

## 2016-01-30 MED ORDER — INSULIN ASPART 100 UNIT/ML ~~LOC~~ SOLN: 0.0000 [IU] | Freq: Three times a day (TID) | SUBCUTANEOUS | Status: DC

## 2016-01-30 MED ORDER — GLUCERNA SHAKE PO LIQD: 237.0000 mL | Freq: Two times a day (BID) | ORAL | Status: DC

## 2016-01-30 MED ORDER — MAGNESIUM OXIDE 400 (241.3 MG) MG PO TABS: 400.0000 mg | ORAL_TABLET | Freq: Every day | ORAL | Status: DC

## 2016-01-30 MED ORDER — AMOXICILLIN-POT CLAVULANATE 875-125 MG PO TABS: 1.0000 | ORAL_TABLET | Freq: Two times a day (BID) | ORAL | Status: DC

## 2016-01-30 MED ORDER — PANTOPRAZOLE SODIUM 40 MG PO TBEC: 40.0000 mg | DELAYED_RELEASE_TABLET | Freq: Every day | ORAL | Status: DC

## 2016-01-30 MED ORDER — OXYCODONE HCL 5 MG PO TABS: 5.0000 mg | ORAL_TABLET | ORAL | Status: DC | PRN

## 2016-01-30 MED ORDER — ZOLPIDEM TARTRATE 5 MG PO TABS
5.0000 mg | ORAL_TABLET | Freq: Once | ORAL | Status: AC
  Administered 2016-01-30: 5 mg via ORAL
  Filled 2016-01-30: qty 1

## 2016-01-30 MED ORDER — METHOCARBAMOL 500 MG PO TABS: 1000.0000 mg | ORAL_TABLET | Freq: Three times a day (TID) | ORAL | Status: DC | PRN

## 2016-01-30 MED ORDER — SENNOSIDES-DOCUSATE SODIUM 8.6-50 MG PO TABS: 1.0000 | ORAL_TABLET | Freq: Two times a day (BID) | ORAL | Status: DC

## 2016-01-30 MED ORDER — INSULIN GLARGINE 100 UNIT/ML ~~LOC~~ SOLN: 20.0000 [IU] | Freq: Every day | SUBCUTANEOUS | Status: DC

## 2016-01-30 NOTE — Care Management Note (Signed)
Case Management Note  Patient Details  Name: Valerie [name] MRN: [id number] Date of Birth: 04/02/1946  Subjective/Objective:  Paitent left via ambulance to [facility]. Contacted [facility] rep Rickie, & [employer] delivery rep Alexis to inform of d/c SNF, & to take patients home wound vac off hold-not needed for home.  They both voiced understanding.                  Action/Plan:d/c SNF.   Expected Discharge Date:                  Expected Discharge Plan:  Skilled Nursing Facility  In-House Referral:  NA, Clinical Social Work  Discharge planning Services  CM Consult  Post Acute Care Choice:    Choice offered to:     DME Arranged:    DME Agency:     HH Arranged:    HH Agency:     Status of Service:  Completed, signed off  Medicare Important Message Given:  Yes Date Medicare IM Given:    Medicare IM give by:    Date Additional Medicare IM Given:    Additional Medicare Important Message give by:     If discussed at Long Length of Stay Meetings, dates discussed:    Additional Comments:  Lanier Clam, RN 01/30/2016, 12:32 PM

## 2016-01-30 NOTE — Progress Notes (Signed)
Report given to Weston Settle at [facility] and rehab. Pt will be transported via EMS

## 2016-01-30 NOTE — Discharge Summary (Signed)
Physician Discharge Summary  Valerie [name] YNW:[id number] DOB: [birth year] DOA: 01/14/2016  PCP: Garth Schlatter, MD   Admit date: 01/14/2016 Discharge date: 01/30/2016  Recommendations for Outpatient Follow-up:  Continue Augmentin for 14 days on discharge. Patient will follow up with surgery.  Discharge Diagnoses:  Principal Problem:   Small bowel obstruction (HCC) Active Problems:   Incarcerated hernia   Essential hypertension, benign   Lower urinary tract infection   Acute renal failure superimposed on stage 3 chronic kidney disease (HCC)   Anemia of chronic disease   Anemia associated with acute blood loss   Controlled type 2 diabetes mellitus with diabetic nephropathy, without long-term current use of insulin (HCC)   Morbid obesity due to excess calories (HCC)   Yeast UTI   Dyslipidemia associated with type 2 diabetes mellitus (HCC)   Hypomagnesemia   Pressure ulcer    Discharge Condition: stable   Diet recommendation: as tolerated   History of present illness:  70 y.o. female with past medical history significant for overactive bladder, chronic UTIs, necrotizing pancreatitis status post partial pancreatectomy, morbid obesity, type 2 diabetes mellitus, just recently discharged after being hospitalized for small bowel obstruction. Patient presented to [facility] 01/14/2016 with worsening nausea, vomiting and abdominal discomfort for past day prior to this admission. CT abdomen and pelvis on the admission demonstrated small bowel obstruction with proximal ileum entrapment in the ventral hernia.  Patient is status post exploratory laparotomy, LOA, extensive debridement of skin and sq tissue and muscle, repair of incarcerated incisional hernia with biologic mesh, VAC application by Dr. Derrell Lolling 01/15/2016 for Incarcerated incisional hernias, multiple, with small bowel obstruction ischemic fat necrosis of skin and subcutaneous tissue and fascia. She has midline VAC placed  and NG tube, managed by surgery.  Major events since admission: 1/18 - ex lap by Dr. Derrell Lolling, SDU post op 1/21 - improving slowly, NGT in place  1/23 - allowing sips  1/24 - NGT out, advancing diet slowly  1/25 - CT chest with developing HCAP vs aspiration, multifocal PNA, added bread spectrum ABX   Hospital Course:    Assessment/Plan:    Principal problem: Exploratory laparotomy / repair of incarcerated incisional hernia / Leukocytosis  - CT abdomen and pelvis on the admission demonstrated small bowel obstruction with proximal ileum entrapment in the ventral hernia - Status post exploratory laparotomy, LOA, extensive debridement of skin and sq tissue and muscle, repair of incarcerated incisional hernia with biologic mesh, VAC application by Dr. Derrell Lolling 01/15/2016 for incarcerated incisional hernias, multiple, with small bowel obstruction ischemic fat necrosis of skin and subcutaneous tissue and fascia.  - Continue VAC dressing changes MWF - Antibiotic changed to Augmentin 01/29/2016 and she can continue this for 14 days on discharge  Active Problems: Urinary tract infection, yeast  - Has had Klebsiella UTI during recent hospitalization. Urine culture on this admission growing yeast - Pt completed course of Fluconazole   Acute respiratory failure with hypoxia - 1/23 CXR with pulmonary vascular congestion and possibly developing multifocal PNA vs HCAP and aspiration  - Continue Lasix 20 mg daily along with potassium supplementation   Acute renal failure superimposed on chronic kidney disease stage 4 - Baseline creatinine is 1.34 and on this admission acutely elevated at 2.15 likely in the setting of acute illness - Creatinine WNL  Hypomagnesemia / hypokalemia - Continue to supplement magnesium and potassium on discharge   Anemia of chronic disease / Acute post operative blood loss anemia  - Due to combination of CKD  and postoperative blood loss anemia  - Hemoglobin is  stable at 8.4  Dyslipidemia associated with diabetes mellitus type 2 - Continue Lipitor 10 mg at bedtime, omega 3 and fenofibrate   Essential hypertension, benign - Continue Lasix 20 mg daily - Continue Norvasc daily   Controlled type 2 diabetes mellitus with diabetic nephropathy with long-term insulin use (HCC) - A1c is 6.4, indicating good glycemic control  - Continue Lantus 20 units at bedtime along with SSI  Morbid obesity due to excess calorie - Body mass index is 47.81 kg/(m^2). - Counseled on diet   DVT Prophylaxis  - Heparin subQ in hospital    Code Status: Full.  Family Communication: plan of care discussed with the patient  IV access:  Peripheral IV  Procedures and diagnostic studies:   Exploratory laparotomy, LOA, extensive debridement of skin and sq tissue and muscle, repair of incarcerated incisional hernia with biologic mesh, VAC application---Dr. Derrell Lolling 01/15/2016 for Incarcerated incisional hernias, multiple, with small bowel obstruction Ischemic fat necrosis of skin and subcutaneous tissue and fascia.  Ct Abdomen Pelvis Wo Contrast 01/14/2016 1. Small bowel obstruction related to entrapment of proximal ileum in a small ventral hernia. Proximal small bowel is dilated with multiple air-fluid levels measuring up to 5.6 cm in diameter. Distal small bowel is completely decompressed, and colon is largely decompressed as well. Emergent surgical consultation is strongly recommended. 2. Extensive airspace disease in the lung bases bilaterally (left greater than right), concerning for potential aspiration pneumonia. 3. Cholelithiasis without evidence of acute cholecystitis at this time. 4. Chronic mild right hydronephrosis with urothelial thickening in the right renal collecting system, poorly evaluated on today's noncontrast CT examination, but similar in retrospect to prior studies. Nonemergent Urology consultation is recommended for further evaluation. 5.  Atherosclerosis, including right coronary artery disease. 6. There are calcifications of the mitral valve and mitral annulus. Echocardiographic correlation for evaluation of potential valvular dysfunction may be warranted if clinically indicated. 7. Additional incidental findings, as above. Electronically Signed By: Trudie Reed M.D. On: 01/14/2016 22:13   Dg Chest 1 View 01/20/2016 1. Cardiomegaly with mild pulmonary vascular congestion. 2. There is a 2.7 cm nodular opacity in the right midlung most concerning for pneumonia.   Ct Chest Wo Contrast 01/21/2016 Multifocal airspace process worse over the right upper lobe and left base likely multifocal pneumonia and likely secondary to aspiration with small amount of aspirate material noted within the left posterior aspect of the mid to distal trachea. Small left pleural effusion. Minimal reactive mediastinal adenopathy. Stable cardiomegaly with severe calcification of the mitral valve annulus. 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   Medical Consultants:  Surgery   Other Consultants:  PT WOC DM coordinator  IAnti-Infectives:   Vanco ans zosyn 01/15/2016 --> 1/20 Rocephin 1/20 --> 1/25 Vanc resumed 1/25 for HCAP coverage --> 01/29/16 Maxipime 1/25 for HCAP coverage --> 01/29/16 Augmentin 01/29/2016 --> for 14 days on discharge    Signed:  Manson Passey, MD  [facility] 01/30/2016, 10:27 AM  Pager #: [phone]  Time spent in minutes: more than 30 minutes    Discharge Exam: Filed Vitals:   01/30/16 0355 01/30/16 0850  BP: 138/68 137/56  Pulse: 94 94  Temp: 97.9 F (36.6 C) 98.2 F (36.8 C)  Resp: 22 20   Filed Vitals:   01/29/16 1347 01/29/16 2059 01/30/16 0355 01/30/16 0850  BP: 107/63 113/46 138/68 137/56  Pulse: 97 91 94 94  Temp: 97.5 F (  36.4 C) 99.1 F (37.3 C) 97.9 F (36.6 C) 98.2 F (36.8 C)  TempSrc: Oral Oral Oral Oral  Resp: Height:      Weight:   274 lb 0.5 oz (124.3 kg)   SpO2: 100% 95% 95% 95%    General: Pt is alert, follows commands appropriately, not in acute distress Cardiovascular: Regular rate and rhythm, S1/S2 + Respiratory: Clear to auscultation bilaterally, no wheezing, no crackles, no rhonchi Abdominal: Soft, bowel sounds +, no guarding Extremities: no edema, no cyanosis, pulses palpable bilaterally DP and PT Neuro: Grossly nonfocal  Discharge Instructions  Discharge Instructions    Call MD for:  difficulty breathing, headache or visual disturbances    Complete by:  As directed      Call MD for:  persistant dizziness or light-headedness    Complete by:  As directed      Call MD for:  persistant nausea and vomiting    Complete by:  As directed      Call MD for:  severe uncontrolled pain    Complete by:  As directed      Diet - low sodium heart healthy    Complete by:  As directed      Discharge instructions    Complete by:  As directed   Continue Augmentin for 14 days on discharge. Patient will follow up with surgery.     Increase activity slowly    Complete by:  As directed             Medication List    STOP taking these medications        ciprofloxacin 500 MG tablet  Commonly known as:  CIPRO     doxycycline 100 MG tablet  Commonly known as:  VIBRA-TABS     Insulin Pen Needle 32G X 5 MM Misc  Commonly known as:  NOVOTWIST     insulin regular human CONCENTRATED 500 UNIT/ML injection  Commonly known as:  HUMULIN R     MYRBETRIQ 50 MG Tb24 tablet  Generic drug:  mirabegron ER     naproxen sodium 220 MG tablet  Commonly known as:  ANAPROX     omeprazole 20 MG capsule  Commonly known as:  PRILOSEC     ONE TOUCH LANCETS Misc     solifenacin 10 MG tablet  Commonly known as:  VESICARE     trimethoprim 100 MG tablet  Commonly known as:  TRIMPEX      TAKE these medications        ALLEGRA PO  Take 1 tablet by mouth daily as needed (allergies).     amLODipine  10 MG tablet  Commonly known as:  NORVASC  TAKE 1 TABLET BY MOUTH EVERY DAY     amoxicillin-clavulanate 875-125 MG tablet  Commonly known as:  AUGMENTIN  Take 1 tablet by mouth 2 (two) times daily with a meal.     atorvastatin 10 MG tablet  Commonly known as:  LIPITOR  TAKE 1 TABLET BY MOUTH DAILY     CRANBERRY PO  Take 1 tablet by mouth 2 (two) times daily.     feeding supplement (GLUCERNA SHAKE) Liqd  Take 237 mLs by mouth 2 (two) times daily between meals.     fenofibrate 160 MG tablet  TAKE 1 TABLET BY MOUTH DAILY     Fish Oil 1000 MG Caps  Take 1 capsule by mouth daily.     furosemide 20 MG tablet  Commonly  known as:  LASIX  Take 1 tablet (20 mg total) by mouth daily.     glucose blood test strip  Commonly known as:  ONE TOUCH ULTRA TEST  TEST EIGHT TIMES DAILY     hydrocerin Crea  Apply 1 application topically 2 (two) times daily.     insulin aspart 100 UNIT/ML injection  Commonly known as:  novoLOG  Inject 0-20 Units into the skin 3 (three) times daily with meals.     insulin glargine 100 UNIT/ML injection  Commonly known as:  LANTUS  Inject 0.2 mLs (20 Units total) into the skin at bedtime.     magnesium oxide 400 (241.3 Mg) MG tablet  Commonly known as:  MAG-OX  Take 1 tablet (400 mg total) by mouth daily.     methocarbamol 500 MG tablet  Commonly known as:  ROBAXIN  Take 2 tablets (1,000 mg total) by mouth every 8 (eight) hours as needed for muscle spasms.     mometasone 50 MCG/ACT nasal spray  Commonly known as:  NASONEX  Place 2 sprays into the nose daily as needed (allergies).     ondansetron 4 MG disintegrating tablet  Commonly known as:  ZOFRAN ODT  Take 1 tablet (4 mg total) by mouth every 8 (eight) hours as needed for nausea.     oxyCODONE 5 MG immediate release tablet  Commonly known as:  Oxy IR/ROXICODONE  Take 1 tablet (5 mg total) by mouth every 4 (four) hours as needed for moderate pain, severe pain or breakthrough pain.      pantoprazole 40 MG tablet  Commonly known as:  PROTONIX  Take 1 tablet (40 mg total) by mouth daily.     potassium chloride SA 20 MEQ tablet  Commonly known as:  K-DUR,KLOR-CON  Take 1 tablet (20 mEq total) by mouth daily.     senna-docusate 8.6-50 MG tablet  Commonly known as:  Senokot-S  Take 1 tablet by mouth 2 (two) times daily.     TYLENOL EX ST ARTHRITIS PAIN 500 MG tablet  Generic drug:  acetaminophen  Take 500 mg by mouth every 6 (six) hours as needed for mild pain or moderate pain.           Follow-up Information    Call Ernestene Mention, MD.   Specialty:  General Surgery   Why:  For post-operation check with Dr. Derrell Lolling.  Call to verify appointment date/time.   Contact information:   [address] [place] [address] [phone]       Follow up with [facility].   Specialty:  Skilled Nursing Facility   Contact information:   [address] [place] [address] [phone]      Follow up with Garth Schlatter, MD. Schedule an appointment as soon as possible for a visit in 2 weeks.   Specialty:  Family Medicine   Why:  Follow up appt after recent hospitalization   Contact information:   [facility] [address] [place] [address] [phone]        The results of significant diagnostics from this hospitalization (including imaging, microbiology, ancillary and laboratory) are listed below for reference.    Significant Diagnostic Studies: Ct Abdomen Pelvis Wo Contrast  01/14/2016  CLINICAL DATA:  70 year old female with history of abdominal pain most severe in the right lower quadrant. Three episodes of emesis today. EXAM: CT ABDOMEN AND PELVIS WITHOUT CONTRAST TECHNIQUE: Multidetector CT imaging  of the abdomen and pelvis was performed following the standard protocol without IV contrast. COMPARISON:  CT the abdomen and pelvis 01/06/2016. FINDINGS: Lower chest: Airspace  consolidation in the lung bases bilaterally, most severe in the left lower lobe, concerning for sequela of recent aspiration. Severe calcifications of the mitral valve and mitral annulus. Atherosclerotic calcifications in the right coronary artery. Hepatobiliary: No definite cystic or solid hepatic lesions are noted on today's noncontrast CT examination. Multiple small calcified gallstones lying dependently in the gallbladder. No evidence to suggest an acute cholecystitis at this time. Pancreas: No definite pancreatic mass or peripancreatic inflammatory changes on today's noncontrast CT examination. Spleen: Unremarkable. Adrenals/Urinary Tract: Left kidney is normal in appearance. Calcification in the medial limb of the left adrenal gland likely related to remote hemorrhage or infection. 2.8 x 1.6 cm indeterminate right adrenal nodule is similar to remote prior study 05/23/2013, favored to represent a benign lesion such as a lipid poor adenoma. Right kidney is remarkable for apparent urothelial thickening and slight hydroureteronephrosis. There are no calcifications identified within the collecting system of the right kidney, along the course of the right ureter or within the lumen of the urinary bladder. No right-sided hydroureter. Urinary bladder is nearly completely decompressed, but otherwise unremarkable in appearance. Stomach/Bowel: The appearance of the stomach is unremarkable. Multiple dilated loops of small bowel are noted measuring up to 5.6 cm in diameter, and there multiple air-fluid levels noted. This affects predominantly the proximal and mid small bowel extending to the level of the proximal ileum, where there is a small ventral hernia containing a short segment of the proximal ileum, beyond which the distal small bowel appears completely decompressed. Colon is relatively decompressed as well. Numerous colonic diverticulae are noted, without surrounding inflammatory changes to suggest an acute  diverticulitis at this time. Vascular/Lymphatic: Atherosclerosis throughout the abdominal and pelvic vasculature, without evidence of aneurysm or dissection. No lymphadenopathy noted in the abdomen or pelvis on today's noncontrast CT examination. Reproductive: Uterus and ovaries are atrophic. Other: No significant volume of ascites.  No pneumoperitoneum. Musculoskeletal: Extensive skin thickening in the subcutaneous fat of the patient's large pannus, with there multiple dystrophic calcifications. These findings appear to be chronic and are similar to the prior study. There are no aggressive appearing lytic or blastic lesions noted in the visualized portions of the skeleton. IMPRESSION: 1. Small bowel obstruction related to entrapment of proximal ileum in a small ventral hernia. Proximal small bowel is dilated with multiple air-fluid levels measuring up to 5.6 cm in diameter. Distal small bowel is completely decompressed, and colon is largely decompressed as well. Emergent surgical consultation is strongly recommended. 2. Extensive airspace disease in the lung bases bilaterally (left greater than right), concerning for potential aspiration pneumonia. 3. Cholelithiasis without evidence of acute cholecystitis at this time. 4. Chronic mild right hydronephrosis with urothelial thickening in the right renal collecting system, poorly evaluated on today's noncontrast CT examination, but similar in retrospect to prior studies. Nonemergent Urology consultation is recommended for further evaluation. 5. Atherosclerosis, including right coronary artery disease. 6. There are calcifications of the mitral valve and mitral annulus. Echocardiographic correlation for evaluation of potential valvular dysfunction may be warranted if clinically indicated. 7. Additional incidental findings, as above. Electronically Signed   By: Trudie Reed M.D.   On: 01/14/2016 22:13   Ct Abdomen Pelvis Wo Contrast  01/06/2016  CLINICAL DATA:   Right-sided abdominal pain. Possible calculi on conventional radiographs. History of necrotizing pancreatitis and diabetes. History pancreatic surgery. EXAM:  CT ABDOMEN AND PELVIS WITHOUT CONTRAST TECHNIQUE: Multidetector CT imaging of the abdomen and pelvis was performed following the standard protocol without IV contrast. COMPARISON:  01/06/2016 ultrasounds and conventional radiographs FINDINGS: Body habitus reduces diagnostic sensitivity and specificity. Lower chest: Very dense mitral valve calcification observed. Mosaic attenuation at the left lung base with associated atelectasis. Mild cardiomegaly. Hepatobiliary: Clustered small gallstones measuring up to 6 mm in diameter are settled dependently in the gallbladder. None of these are directly observed to be in the common bile duct or common hepatic duct. Punctate calcification along the capsule of the inferior tip of the right hepatic lobe, likely due to remote inflammation. Pancreas: Atrophic pancreatic body and pancreatic head. Poor definition of fat planes around the pancreatic tail. Aortoiliac atherosclerotic vascular disease. In the vicinity of the spleen, possibly from acute inflammation or chronic scarring. Spleen: Unremarkable Adrenals/Urinary Tract: Chronic appearing calcification in the upper portion of the left adrenal gland. Right adrenal mass measuring 3.1 by 2.1 cm has internal density of 14 Hounsfield units, technically nonspecific. Mild thinning of the renal cortex bilaterally with prominence of renal sinus adipose tissue. Prominent right collecting system and proximal right hydroureter. Suspected wall thickening in the right collecting system. Normal left ureter. No urinary tract calculi are identified. Tiny amount of gas in the urinary bladder. Query recent catheterization. Stomach/Bowel: Right periumbilical hernia containing a loop of small bowel with dilated bowel up to about 5.5 cm proximal to this loop, and with the distal small bowel not  dilated, shown on image 44 series 3 and image 81 series 6. There also left para umbilical abdominal herniations, 1 containing transverse colon and the other containing a dilated loop of small bowel, but not acting as a focus for obstruction. Vascular/Lymphatic: Aortoiliac atherosclerotic vascular disease. No pathologic adenopathy observed. Reproductive: Calcification along the anterior uterine body measuring 1.6 cm, probably from a subserosal fibroid. Adnexa unremarkable. Other: Suspected prior laparotomy with some separation of the pannus into left and right components. Both demonstrate abnormal subcutaneous edema and skin thickening which is not fully included on today' s exam because of body habitus. On the left side there is a greater degree of stranding in the pannus raising suspicion for panniculitis. Musculoskeletal: Bilateral paramidline hernias, with a right ventral hernia causing obstruction of a loop of small bowel, and several left ventral hernias containing transverse colon and small bowel. There is potentially some fluid in the right hernia. Lumbar spondylosis and degenerative disc disease with grade 1 anterolisthesis at L4-5, loss of disc height at L5-S1, and potential central narrowing of the thecal sac at L4-5 due to degenerative disc disease. There notable lucencies along the acetabular roof and posterior right acetabulum, possibly geodes. Given the hazy surrounding bone density in this vicinity, fibrous dysplasia is not excluded. IMPRESSION: 1. Small bowel obstruction due to a right paracentral hernia containing a loop of small bowel with dilated the efferent component and nondilated efferent component. There is probably some fluid in this hernia sac and strangulation is not excluded. 2. There several left paramedian hernia sacs, 1 containing transverse colon and the lower hernia containing a dilated loop of small bowel. This left-sided hernia is not thought to be the site of obstruction. 3.  Bilateral panniculitis left greater than right. 4. Fullness of the right collecting system and right proximal ureter possibly with slight wall thickening in the right proximal collecting system. Inflammation in this vicinity is not excluded but I do not see urinary tract calculi 5. Clustered tiny gallstones observed.  6. Poor definition of fat planes along the pancreatic tail, quite likely from chronic scarring although active pancreatitis is not completely excluded. Atrophic pancreatic body and head likely from remote pancreatic injury. 7. Prominent dense calcification of the mitral valve with mild cardiomegaly. 8. Atelectasis in the left lower lobe. 9. Calcification situated between the anterior uterine body in urinary bladder probably a subserosal calcified fibroid. 10. Lower lumbar spondylosis and degenerative disc disease with potential mild impingement at L4-5. 11. Lucencies along the right acetabular roof and posterior acetabulum with surrounding ground-glass matrix bone, query fibrous dysplasia and/or geodes with surrounding reactive findings. Electronically Signed   By: Gaylyn Rong M.D.   On: 01/06/2016 19:20   Dg Chest 1 View  01/20/2016  CLINICAL DATA:  Shortness of breath for 2 days.  Former smoker. EXAM: CHEST 1 VIEW COMPARISON:  01/06/2016 FINDINGS: There is a 2.7 cm nodular opacity in the right midlung. There is bilateral interstitial thickening. There is no pleural effusion or pneumothorax. There is stable cardiomegaly. There is a nasogastric tube coursing below the diaphragm. The osseous structures are unremarkable. IMPRESSION: 1. Cardiomegaly with mild pulmonary vascular congestion. 2. There is a 2.7 cm nodular opacity in the right midlung most concerning for pneumonia. Electronically Signed   By: Elige Ko   On: 01/20/2016 13:47   Ct Chest Wo Contrast  01/21/2016  CLINICAL DATA:  Dyspnea.  Follow-up for pneumonia. EXAM: CT CHEST WITHOUT CONTRAST TECHNIQUE: Multidetector CT imaging  of the chest was performed following the standard protocol without IV contrast. COMPARISON:  Chest x-ray 01/20/2016, 01/06/2016 as well as abdominal pelvic CT 01/14/2016, 01/06/2016 and 05/23/2013 FINDINGS: Lungs are adequately inflated demonstrate patchy bilateral airspace process most prominent over the right upper lobe and left lower lobe. Small left pleural effusion. Right mid to upper lung airspace disease is new compared to the prior chest x-ray 01/06/2016. Consolidation in the lung bases is not significantly changed compared to CT 01/14/2016, although the small left pleural effusion is new. Mild bibasilar bronchiectatic change. Small amount of likely aspirated material along the left posterior aspect of the mid to distal trachea. There is mild stable cardiomegaly with severe calcification over the mitral valve annulus. Calcified plaque right coronary artery. Calcified plaque over the thoracic aorta. 1.4 cm precarinal lymph node likely reactive. Few other smaller mediastinal lymph nodes. Remaining mediastinal structures are within normal. 8-9 mm hypodense nodule over the right lobe of the thyroid likely benign. Images over the upper abdomen are unremarkable. Mild degenerate change of the spine. IMPRESSION: Multifocal airspace process worse over the right upper lobe and left base likely multifocal pneumonia and likely secondary to aspiration with small amount of aspirate material noted within the left posterior aspect of the mid to distal trachea. Small left pleural effusion. Minimal reactive mediastinal adenopathy. Stable cardiomegaly with severe calcification of the mitral valve annulus. 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 Electronically Signed   By: Elberta Fortis M.D.   On: 01/21/2016 16:25   US Abdomen Complete  01/06/2016  CLINICAL DATA:  Five day history of right-sided pain EXAM: ABDOMEN ULTRASOUND COMPLETE COMPARISON:  None. FINDINGS: Gallbladder:  Within the gallbladder, there are multiple small echogenic foci which move and shadow consistent with cholelithiasis. Largest individual gallstone measures 5 mm in length. There is no gallbladder wall thickening or pericholecystic fluid. No sonographic Murphy sign noted by sonographer. Common bile duct: Diameter: 6 mm. No intrahepatic, common hepatic, or common bile duct dilatation. Liver: No  focal lesion identified. Within normal limits in parenchymal echogenicity. IVC: No abnormality visualized. Pancreas: Visualized portion unremarkable. Portions of pancreas obscured by gas. Spleen: Size and appearance within normal limits. Right Kidney: Length: 11.7 cm. Echogenicity within normal limits. No mass or visualized. There is mild fullness of the right renal pelvis and upper pole collecting system. Obstructing focus not seen. Left Kidney: Length: 13.3 cm. Echogenicity within normal limits. No mass or hydronephrosis visualized. Abdominal aorta: No aneurysm visualized. Other findings: No demonstrable ascites. There is a hypoechoic lesion just superior to the right kidney, likely an adrenal lesion on the right. This lesion measures 2.5 x 1.3 x 2.5 cm. IMPRESSION: Cholelithiasis. No gallbladder wall thickening or pericholecystic fluid. Fullness of the right renal pelvis and upper pole collecting system. Obstructing lesion not appreciable by ultrasound. This finding may warrant noncontrast enhanced CT to assess for potential ureteral calculus. Probable right renal mass measuring 2.5 x 1.3 x 2.5 cm. CT could also be helpful to further assess this area. Study otherwise unremarkable. Note that portions of pancreas not seen due to overlying gas. Electronically Signed   By: Bretta Bang III M.D.   On: 01/06/2016 18:02   Dg Abd 2 Views  01/08/2016  CLINICAL DATA:  Abdominal pain.  Small-bowel obstruction. EXAM: ABDOMEN - 2 VIEW COMPARISON:  Abdominal radiographs 01/07/2016. CT of the abdomen and pelvis 01/06/2016.  FINDINGS: Dilation of small bowel is slightly improved. There is persistent distention of both large and small bowel in the left lower quadrant. Degenerative changes are present in the lower lumbar spine. Atherosclerotic calcifications are present. There is no free air. IMPRESSION: 1. Slight improvement and small bowel distension with persistent left-sided small bowel obstruction. Electronically Signed   By: Marin Roberts M.D.   On: 01/08/2016 08:56   Dg Abd 2 Views  01/07/2016  CLINICAL DATA:  Small-bowel obstruction EXAM: ABDOMEN - 2 VIEW COMPARISON:  01/06/2016 FINDINGS: There are several loops of mid to left abdominal small bowel which are distended up to 5.4 cm in diameter. There is air in the stomach and there are small bowel air-fluid levels. There is minimal air into the colon. IMPRESSION: Findings again consistent with small-bowel obstruction Electronically Signed   By: Esperanza Heir M.D.   On: 01/07/2016 12:17   Dg Abd Acute W/chest  01/06/2016  CLINICAL DATA:  Right upper quadrant abdominal pain with vomiting. History of hypertension and diabetes. EXAM: DG ABDOMEN ACUTE W/ 1V CHEST COMPARISON:  None. FINDINGS: Examination is limited by body habitus. The heart size is at the upper limits of normal. There is patchy airspace disease in the left lower lobe. The right lung is clear. No pneumothorax or significant pleural effusion identified. The bowel gas pattern is normal. There is no free intraperitoneal air. 12 mm calcification overlies the lower pole of the right kidney. Left pelvic calcifications likely vascular. Mild degenerative changes are present within the lumbar spine and at the right hip. IMPRESSION: 1. Patchy left lower lobe atelectasis or infiltrate. 2. No acute abdominal findings demonstrated. 3. Right mid and pelvic calcifications as described. The former could reflect a renal calculus or gallstone. Electronically Signed   By: Carey Bullocks M.D.   On: 01/06/2016 14:56   Dg Abd  Portable 1v  01/19/2016  CLINICAL DATA:  Small bowel obstruction. EXAM: PORTABLE ABDOMEN - 1 VIEW COMPARISON:  CT of the abdomen pelvis 01/14/2016 FINDINGS: There is persistent small bowel obstruction with dilated small bowel loops measuring up to 4.3 cm in cross-section. Enteric  catheter is likely transpyloric. There is no evidence of free intra-abdominal gas or intestinal pneumatosis on these supine radiographs. No radiographic evidence of organomegaly. Left anterior rib fractures are again seen. There is patchy left lung base airspace consolidation. IMPRESSION: Persistent small bowel obstruction with maximum diameter of small bowels of 4.3 cm. Electronically Signed   By: Ted Mcalpine M.D.   On: 01/19/2016 08:13    Microbiology: No results found for this or any previous visit (from the past 240 hour(s)).   Labs: Basic Metabolic Panel:  Recent Labs Lab 01/24/16 0520 01/25/16 0543 01/28/16 0555 01/29/16 0553 01/30/16 0520  NA 137 142 139 138 139  K 3.4* 4.1 3.1* 3.6 3.7  CL 101 102 100* 100* 100*  CO2 27 29 31 29 29   GLUCOSE 215* 301* 136* 208* 214*  BUN 10 11 11 11 12   CREATININE 0.58 0.85 0.78 0.81 0.88  CALCIUM 8.3* 8.9 8.6* 8.7* 9.0  MG 1.8 1.7 1.5* 1.6* 1.5*   Liver Function Tests: No results for input(s): AST, ALT, ALKPHOS, BILITOT, PROT, ALBUMIN in the last 168 hours. No results for input(s): LIPASE, AMYLASE in the last 168 hours. No results for input(s): AMMONIA in the last 168 hours. CBC:  Recent Labs Lab 01/24/16 0520 01/25/16 0543 01/28/16 0555 01/29/16 0553  WBC 5.2 4.4 4.0 4.2  HGB 8.3* 8.6* 7.9* 8.4*  HCT 27.7* 29.2* 26.0* 27.6*  MCV 92.0 94.2 92.9 91.7  PLT 226 248 200 214   Cardiac Enzymes: No results for input(s): CKTOTAL, CKMB, CKMBINDEX, TROPONINI in the last 168 hours. BNP: BNP (last 3 results) No results for input(s): BNP in the last 8760 hours.  ProBNP (last 3 results) No results for input(s): PROBNP in the last 8760  hours.  CBG:  Recent Labs Lab 01/29/16 0948 01/29/16 1311 01/29/16 1758 01/29/16 2101 01/30/16 0746  GLUCAP 212* 184* 157* 173* 212*

## 2016-01-30 NOTE — Care Management Note (Signed)
Case Management Note  Patient Details  Name: Kamala Kolton MRN: [id number] Date of Birth: [birth year]  Subjective/Objective: Per attending-d/c SNF today. CSW aware & following for SNF-[facility]-agreed by patient/spouse.[employer] wound vac delivery liason Jon Gills has contacted me-will keep home wound vac on hold until I confirm d/c to SNF. Once ambulance called, will let [employer] rep Jon Gills aware to cancel delivery of wound vac.                   Action/Plan:d/c SNF.   Expected Discharge Date:                  Expected Discharge Plan:  Skilled Nursing Facility  In-House Referral:  NA, Clinical Social Work  Discharge planning Services  CM Consult  Post Acute Care Choice:    Choice offered to:     DME Arranged:    DME Agency:     HH Arranged:    HH Agency:     Status of Service:  Completed, signed off  Medicare Important Message Given:  Yes Date Medicare IM Given:    Medicare IM give by:    Date Additional Medicare IM Given:    Additional Medicare Important Message give by:     If discussed at Long Length of Stay Meetings, dates discussed:    Additional Comments:  Lanier Clam, RN 01/30/2016, 9:20 AM

## 2016-01-30 NOTE — Discharge Instructions (Signed)
Amoxicillin; Clavulanic Acid extended-release tablets °What is this medicine? °AMOXICILLIN; CLAVULANIC ACID (a mox i SILL in; KLAV yoo lan ic AS id) is a penicillin antibiotic. It is used to treat certain kinds of bacterial infections. It will not work for colds, flu, or other viral infections. °This medicine may be used for other purposes; ask your health care provider or pharmacist if you have questions. °What should I tell my health care provider before I take this medicine? °They need to know if you have any of these conditions: °-bowel disease, like colitis °-kidney disease °-liver disease °-mononucleosis °-an unusual or allergic reaction to amoxicillin, penicillin, cephalosporin, other antibiotics, clavulanic acid, other medicines, foods, dyes, or preservatives °-pregnant or trying to get pregnant °-breast-feeding °How should I use this medicine? °Take this medicine by mouth with a full glass of water. Follow the directions on the prescription label. Take at the start of a meal. Do not crush or chew. You may cut this medicine in half at the score line for easier swallowing. Take your medicine at regular intervals. Do not take your medicine more often than directed. Take all of your medicine as directed even if you think you are better. Do not skip doses or stop your medicine early. °Contact your pediatrician or health care professional regarding the use of this medicine in children. This medicine has been used in children as young as 16 years of age. °Overdosage: If you think you have taken too much of this medicine contact a poison control center or emergency room at once. °NOTE: This medicine is only for you. Do not share this medicine with others. °What if I miss a dose? °If you miss a dose, take it as soon as you can. If it is almost time for your next dose, take only that dose. Do not take double or extra doses. °What may interact with this medicine? °-allopurinol °-anticoagulants °-birth control  pills °-methotrexate °-probenecid °This list may not describe all possible interactions. Give your health care provider a list of all the medicines, herbs, non-prescription drugs, or dietary supplements you use. Also tell them if you smoke, drink alcohol, or use illegal drugs. Some items may interact with your medicine. °What should I watch for while using this medicine? °Tell your doctor or health care professional if your symptoms do not improve. °Do not treat diarrhea with over the counter products. Contact your doctor if you have diarrhea that lasts more than 2 days or if it is severe and watery. °If you have diabetes, you may get a false-positive result for sugar in your urine. Check with your doctor or health care professional. °Birth control pills may not work properly while you are taking this medicine. Talk to your doctor about using an extra method of birth control. °What side effects may I notice from receiving this medicine? °Side effects that you should report to your doctor or health care professional as soon as possible: °-allergic reactions like skin rash, itching or hives, swelling of the face, lips, or tongue °-breathing problems °-dark urine °-fever or chills, sore throat °-redness, blistering, peeling or loosening of the skin, including inside the mouth °-seizures °-trouble passing urine or change in the amount of urine °-unusual bleeding, bruising °-unusually weak or tired °-white patches or sores in the mouth or throat °Side effects that usually do not require medical attention (report to your doctor or health care professional if they continue or are bothersome): °-diarrhea °-dizziness °-headache °-nausea, vomiting °-stomach upset °-vaginal or anal irritation °  This list may not describe all possible side effects. Call your doctor for medical advice about side effects. You may report side effects to FDA at 1-800-FDA-1088. °Where should I keep my medicine? °Keep out of the reach of  children. °Store at room temperature below 25 degrees C (77 degrees F). Keep container tightly closed. Throw away any unused medicine after the expiration date. °NOTE: This sheet is a summary. It may not cover all possible information. If you have questions about this medicine, talk to your doctor, pharmacist, or health care provider. °  °© 2016, [employer]. (2008-03-06 14:32:45) ° °

## 2016-01-30 NOTE — Clinical Social Work Placement (Signed)
Patient is set to discharge to [facility] today. Patient & husband at bedside aware. Discharge packet given to RN, Cicero Duck. [facility] called for transport.     Lincoln Maxin, LCSW [facility] Clinical Social Worker cell #: [phone]    CLINICAL SOCIAL WORK PLACEMENT  NOTE  Date:  01/30/2016  Patient Details  Name: Valerie [name] MRN: [id number] Date of Birth: 26-Aug-1946  Clinical Social Work is seeking post-discharge placement for this patient at the Skilled  Nursing Facility level of care (*CSW will initial, date and re-position this form in  chart as items are completed):  Yes   Patient/family provided with [facility] Clinical Social Work Department's list of facilities offering this level of care within the geographic area requested by the patient (or if unable, by the patient's family).  Yes   Patient/family informed of their freedom to choose among providers that offer the needed level of care, that participate in Medicare, Medicaid or managed care program needed by the patient, have an available bed and are willing to accept the patient.  Yes   Patient/family informed of [facility]'s ownership interest in [facility] and [facility], as well as of the fact that they are under no obligation to receive care at these facilities.  PASRR submitted to EDS on 01/29/16     PASRR number received on 01/29/16     Existing PASRR number confirmed on       FL2 transmitted to all facilities in geographic area requested by pt/family on 01/29/16     FL2 transmitted to all facilities within larger geographic area on       Patient informed that his/her managed care company has contracts with or will negotiate with certain facilities, including the following:        Yes   Patient/family informed of bed offers received.  Patient chooses bed at [facility]     Physician recommends and patient chooses bed at      Patient to be transferred to [facility] on 01/30/16.  Patient to be transferred to facility by [facility]     Patient family notified on 01/30/16 of transfer.  Name of family member notified:  patient's husband at bedside     PHYSICIAN       Additional Comment:    _______________________________________________ Arlyss Repress, LCSW 01/30/2016, 11:19 AM

## 2016-01-31 ENCOUNTER — Non-Acute Institutional Stay (SKILLED_NURSING_FACILITY): Payer: Medicare Other | Admitting: Internal Medicine

## 2016-01-31 ENCOUNTER — Encounter: Payer: Self-pay | Admitting: Internal Medicine

## 2016-01-31 DIAGNOSIS — K5669 Other intestinal obstruction: Secondary | ICD-10-CM

## 2016-01-31 DIAGNOSIS — E785 Hyperlipidemia, unspecified: Secondary | ICD-10-CM

## 2016-01-31 DIAGNOSIS — I10 Essential (primary) hypertension: Secondary | ICD-10-CM | POA: Diagnosis not present

## 2016-01-31 DIAGNOSIS — N3090 Cystitis, unspecified without hematuria: Secondary | ICD-10-CM

## 2016-01-31 DIAGNOSIS — D62 Acute posthemorrhagic anemia: Secondary | ICD-10-CM | POA: Diagnosis not present

## 2016-01-31 DIAGNOSIS — B3749 Other urogenital candidiasis: Secondary | ICD-10-CM

## 2016-01-31 DIAGNOSIS — E1169 Type 2 diabetes mellitus with other specified complication: Secondary | ICD-10-CM

## 2016-01-31 DIAGNOSIS — E1121 Type 2 diabetes mellitus with diabetic nephropathy: Secondary | ICD-10-CM

## 2016-01-31 DIAGNOSIS — J9601 Acute respiratory failure with hypoxia: Secondary | ICD-10-CM

## 2016-01-31 DIAGNOSIS — K56609 Unspecified intestinal obstruction, unspecified as to partial versus complete obstruction: Secondary | ICD-10-CM

## 2016-01-31 DIAGNOSIS — B9689 Other specified bacterial agents as the cause of diseases classified elsewhere: Secondary | ICD-10-CM

## 2016-01-31 DIAGNOSIS — N184 Chronic kidney disease, stage 4 (severe): Secondary | ICD-10-CM

## 2016-01-31 DIAGNOSIS — N179 Acute kidney failure, unspecified: Secondary | ICD-10-CM | POA: Diagnosis not present

## 2016-01-31 NOTE — Assessment & Plan Note (Signed)
SNF - Continue Lipitor 10 mg at bedtime, omega 3 and fenofibrate

## 2016-01-31 NOTE — Assessment & Plan Note (Signed)
SNF - Continue Lipitor 10 mg at bedtime, omega 3 and fenofibrate   

## 2016-01-31 NOTE — Assessment & Plan Note (Signed)
SNF - tx with diflucan and tx is finished

## 2016-01-31 NOTE — Assessment & Plan Note (Addendum)
Baseline creatinine is 1.34 and on this admission acutely elevated at 2.15 likely in the setting of acute illness - Creatinine WNL SNF follow up BMP

## 2016-01-31 NOTE — Assessment & Plan Note (Signed)
Body mass index is 47.81 kg/(m^2). SNF -dietary to set up with appropriate diet

## 2016-01-31 NOTE — Assessment & Plan Note (Signed)
SNF - continue repletion with Mgoxide

## 2016-01-31 NOTE — Assessment & Plan Note (Signed)
1/23 CXR with pulmonary vascular congestion and possibly developing multifocal PNA vs HCAP and aspiration  SNF - Continue Lasix 20 mg daily along with potassium supplementation

## 2016-01-31 NOTE — Assessment & Plan Note (Signed)
Due to combination of CKD and postoperative blood loss anemia  - Hemoglobin is stable at 8.4 SNF - follow up CBC

## 2016-01-31 NOTE — Assessment & Plan Note (Signed)
SNF - controlled ; Continue Lasix 20 mg daily - Continue Norvasc daily

## 2016-01-31 NOTE — Progress Notes (Signed)
MRN: [id number] Name: Valerie [name]  Sex: female Age: 70 y.o. DOB: February 02, 1946  [facility] #: [facility] Facility/Room:103 Level Of Care: SNF Provider: Merrilee Seashore D Emergency Contacts: Extended Emergency Contact Information Primary Emergency Contact: [facility] Address: [address]          [place], [place] [address] [place] Home Phone: [phone] Mobile Phone: [phone] Relation: Spouse Secondary Emergency Contact: Elijah Birk [place] Mobile Phone: [phone] Relation: Son  Code Status:   Allergies: Review of patient's allergies indicates no known allergies.  Chief Complaint  Patient presents with  . New Admit To SNF    HPI: Patient is 70 y.o. female whooveractive bladder, chronic UTIs, necrotizing pancreatitis status post partial pancreatectomy, morbid obesity, type 2 diabetes mellitus, just recently discharged after being hospitalized for small bowel obstruction. Patient presented to [facility] hospital 01/14/2016 with worsening nausea, vomiting and abdominal discomfort for past day. Pt was admitted to [facility] from 1/17-2/22 where she underwent surgery for SBO. Hospital course was complicated by acute respiratory failure with hypoxia, a yeast UTI and acute on chronic kidney failure.Pt is admitted to SNF for generalized weakness. While at SNF pt will be followed for DM2, tx with insulin, HLD, tx with lipitor and HTN, tx with norvasc and lasix.  Past Medical History  Diagnosis Date  . Hypertension   . Hyperlipidemia     Past Surgical History  Procedure Laterality Date  . Pancreas surgery  1985  . Cataract extraction w/ intraocular lens  implant, bilateral    . Laparotomy N/A 01/15/2016    Procedure: EXPLORATORY LAPAROTOMY, LYSIS OF ADHESIONS, EXTENSIVE DEBRIDEMENT OF SKIN AND SUBCUTANEOUS TISSUE AND MUSCLE, REPAIR OF INCARCERATED INCISION HERNIA WITH BIOLOGIC MESH, APPLICATION OF NEGATIVE PRESSURE DRESSING;  Surgeon: Claud Kelp, MD;   Location: [facility];  Service: General;  Laterality: N/A;      Medication List       This list is accurate as of: 01/31/16  9:09 PM.  Always use your most recent med list.               ALLEGRA PO  Take 1 tablet by mouth daily as needed (allergies).     amLODipine 10 MG tablet  Commonly known as:  NORVASC  TAKE 1 TABLET BY MOUTH EVERY DAY     amoxicillin-clavulanate 875-125 MG tablet  Commonly known as:  AUGMENTIN  Take 1 tablet by mouth 2 (two) times daily with a meal.     atorvastatin 10 MG tablet  Commonly known as:  LIPITOR  TAKE 1 TABLET BY MOUTH DAILY     CRANBERRY PO  Take 1 tablet by mouth 2 (two) times daily.     feeding supplement (GLUCERNA SHAKE) Liqd  Take 237 mLs by mouth 2 (two) times daily between meals.     fenofibrate 160 MG tablet  TAKE 1 TABLET BY MOUTH DAILY     Fish Oil 1000 MG Caps  Take 1 capsule by mouth daily.     furosemide 20 MG tablet  Commonly known as:  LASIX  Take 1 tablet (20 mg total) by mouth daily.     glucose blood test strip  Commonly known as:  ONE TOUCH ULTRA TEST  TEST EIGHT TIMES DAILY     hydrocerin Crea  Apply 1 application topically 2 (two) times daily.     insulin aspart 100 UNIT/ML injection  Commonly known as:  novoLOG  Inject 0-20 Units into the skin 3 (three) times daily with meals.  insulin glargine 100 UNIT/ML injection  Commonly known as:  LANTUS  Inject 0.2 mLs (20 Units total) into the skin at bedtime.     magnesium oxide 400 (241.3 Mg) MG tablet  Commonly known as:  MAG-OX  Take 1 tablet (400 mg total) by mouth daily.     methocarbamol 500 MG tablet  Commonly known as:  ROBAXIN  Take 2 tablets (1,000 mg total) by mouth every 8 (eight) hours as needed for muscle spasms.     mometasone 50 MCG/ACT nasal spray  Commonly known as:  NASONEX  Place 2 sprays into the nose daily as needed (allergies).     ondansetron 4 MG disintegrating tablet  Commonly known as:  ZOFRAN ODT  Take 1 tablet (4 mg  total) by mouth every 8 (eight) hours as needed for nausea.     oxyCODONE 5 MG immediate release tablet  Commonly known as:  Oxy IR/ROXICODONE  Take 1 tablet (5 mg total) by mouth every 4 (four) hours as needed for moderate pain, severe pain or breakthrough pain.     pantoprazole 40 MG tablet  Commonly known as:  PROTONIX  Take 1 tablet (40 mg total) by mouth daily.     potassium chloride SA 20 MEQ tablet  Commonly known as:  K-DUR,KLOR-CON  Take 1 tablet (20 mEq total) by mouth daily.     senna-docusate 8.6-50 MG tablet  Commonly known as:  Senokot-S  Take 1 tablet by mouth 2 (two) times daily.     TYLENOL EX ST ARTHRITIS PAIN 500 MG tablet  Generic drug:  acetaminophen  Take 500 mg by mouth every 6 (six) hours as needed for mild pain or moderate pain.        No orders of the defined types were placed in this encounter.    Immunization History  Administered Date(s) Administered  . Influenza,inj,Quad PF,36+ Mos 09/18/2013, 09/27/2014, 09/20/2015  . Pneumococcal Conjugate-13 09/27/2014    Social History  Substance Use Topics  . Smoking status: Former Games developer  . Smokeless tobacco: Not on file  . Alcohol Use: No    Family history is  + CA  Review of Systems  DATA OBTAINED: from nurse, husband GENERAL:  no fevers, fatigue, appetite changes SKIN: No itching, rash;healing surgical wound EYES: No eye pain, redness, discharge EARS: No earache, tinnitus, change in hearing NOSE: No congestion, drainage or bleeding  MOUTH/THROAT: No mouth or tooth pain, No sore throat RESPIRATORY: No cough, wheezing, SOB CARDIAC: No chest pain, palpitations, lower extremity edema  GI: No abdominal pain, No N/V/D or constipation, No heartburn or reflux  GU: No dysuria, frequency or urgency, or incontinence  MUSCULOSKELETAL: no unrelieved pain NEUROLOGIC: No headache, dizziness or focal weakness PSYCHIATRIC: No c/o anxiety or sadness   Filed Vitals:   01/31/16 2036  BP: 110/65  Pulse:  88  Temp: 98.2 F (36.8 C)  Resp: 20    SpO2 Readings from Last 1 Encounters:  01/30/16 95%        Physical Exam  GENERAL APPEARANCE: Alert, min conversant,  No acute distress.  SKIN: No diaphoresis rash HEAD: Normocephalic, atraumatic  EYES: Conjunctiva/lids clear. Pupils round, reactive. EOMs intact.  EARS: External exam WNL, canals clear. Hearing grossly normal.  NOSE: No deformity or discharge.  MOUTH/THROAT: Lips w/o lesions  RESPIRATORY: Breathing is even, unlabored. Lung sounds are clear   CARDIOVASCULAR: Heart RRR no murmurs, rubs or gallops. No peripheral edema.   GASTROINTESTINAL: Abdomen is soft, non-tender, not distended w/ normal bowel  sounds; large mid low abd wound dressed withou infection. GENITOURINARY: Bladder non tender, not distended  MUSCULOSKELETAL: No abnormal joints or musculature NEUROLOGIC:  Cranial nerves 2-12 grossly intact. Moves all extremities  PSYCHIATRIC: flat, no behavioral issues  Patient Active Problem List   Diagnosis Date Noted  . Acute respiratory failure with hypoxia (HCC) 01/31/2016  . Pressure ulcer 01/27/2016  . Hypomagnesemia 01/19/2016  . Small bowel obstruction (HCC) 01/17/2016  . Acute renal failure superimposed on stage 4 chronic kidney disease (HCC) 01/17/2016  . Anemia of chronic disease 01/17/2016  . Anemia associated with acute blood loss 01/17/2016  . Controlled type 2 diabetes mellitus with diabetic nephropathy, without long-term current use of insulin (HCC) 01/17/2016  . Morbid obesity due to excess calories (HCC) 01/17/2016  . Yeast UTI 01/17/2016  . Incarcerated hernia 01/17/2016  . Dyslipidemia associated with type 2 diabetes mellitus (HCC) 01/17/2016  . Lower urinary tract infection 09/25/2014  . Essential hypertension, benign 03/07/2014  . Hyperlipidemia 09/12/2013    CBC    Component Value Date/Time   WBC 4.2 01/29/2016 0553   RBC 3.01* 01/29/2016 0553   HGB 8.4* 01/29/2016 0553   HCT 27.6* 01/29/2016  0553   PLT 214 01/29/2016 0553   MCV 91.7 01/29/2016 0553   LYMPHSABS 0.6* 01/15/2016 0142   MONOABS 0.3 01/15/2016 0142   EOSABS 0.1 01/15/2016 0142   BASOSABS 0.0 01/15/2016 0142    CMP     Component Value Date/Time   NA 139 01/30/2016 0520   K 3.7 01/30/2016 0520   CL 100* 01/30/2016 0520   CO2 29 01/30/2016 0520   GLUCOSE 214* 01/30/2016 0520   BUN 12 01/30/2016 0520   CREATININE 0.88 01/30/2016 0520   CREATININE 1.27* 03/07/2014 0945   CALCIUM 9.0 01/30/2016 0520   PROT 6.5 01/15/2016 0142   ALBUMIN 2.7* 01/15/2016 0142   AST 16 01/15/2016 0142   ALT 13* 01/15/2016 0142   ALKPHOS 61 01/15/2016 0142   BILITOT 1.1 01/15/2016 0142   GFRNONAA >60 01/30/2016 0520   GFRAA >60 01/30/2016 0520    Lab Results  Component Value Date   HGBA1C 6.4* 01/06/2016     Ct Abdomen Pelvis Wo Contrast  01/14/2016  CLINICAL DATA:  70 year old female with history of abdominal pain most severe in the right lower quadrant. Three episodes of emesis today. EXAM: CT ABDOMEN AND PELVIS WITHOUT CONTRAST TECHNIQUE: Multidetector CT imaging of the abdomen and pelvis was performed following the standard protocol without IV contrast. COMPARISON:  CT the abdomen and pelvis 01/06/2016. FINDINGS: Lower chest: Airspace consolidation in the lung bases bilaterally, most severe in the left lower lobe, concerning for sequela of recent aspiration. Severe calcifications of the mitral valve and mitral annulus. Atherosclerotic calcifications in the right coronary artery. Hepatobiliary: No definite cystic or solid hepatic lesions are noted on today's noncontrast CT examination. Multiple small calcified gallstones lying dependently in the gallbladder. No evidence to suggest an acute cholecystitis at this time. Pancreas: No definite pancreatic mass or peripancreatic inflammatory changes on today's noncontrast CT examination. Spleen: Unremarkable. Adrenals/Urinary Tract: Left kidney is normal in appearance. Calcification in  the medial limb of the left adrenal gland likely related to remote hemorrhage or infection. 2.8 x 1.6 cm indeterminate right adrenal nodule is similar to remote prior study 05/23/2013, favored to represent a benign lesion such as a lipid poor adenoma. Right kidney is remarkable for apparent urothelial thickening and slight hydroureteronephrosis. There are no calcifications identified within the collecting system of the right kidney,  along the course of the right ureter or within the lumen of the urinary bladder. No right-sided hydroureter. Urinary bladder is nearly completely decompressed, but otherwise unremarkable in appearance. Stomach/Bowel: The appearance of the stomach is unremarkable. Multiple dilated loops of small bowel are noted measuring up to 5.6 cm in diameter, and there multiple air-fluid levels noted. This affects predominantly the proximal and mid small bowel extending to the level of the proximal ileum, where there is a small ventral hernia containing a short segment of the proximal ileum, beyond which the distal small bowel appears completely decompressed. Colon is relatively decompressed as well. Numerous colonic diverticulae are noted, without surrounding inflammatory changes to suggest an acute diverticulitis at this time. Vascular/Lymphatic: Atherosclerosis throughout the abdominal and pelvic vasculature, without evidence of aneurysm or dissection. No lymphadenopathy noted in the abdomen or pelvis on today's noncontrast CT examination. Reproductive: Uterus and ovaries are atrophic. Other: No significant volume of ascites.  No pneumoperitoneum. Musculoskeletal: Extensive skin thickening in the subcutaneous fat of the patient's large pannus, with there multiple dystrophic calcifications. These findings appear to be chronic and are similar to the prior study. There are no aggressive appearing lytic or blastic lesions noted in the visualized portions of the skeleton. IMPRESSION: 1. Small bowel  obstruction related to entrapment of proximal ileum in a small ventral hernia. Proximal small bowel is dilated with multiple air-fluid levels measuring up to 5.6 cm in diameter. Distal small bowel is completely decompressed, and colon is largely decompressed as well. Emergent surgical consultation is strongly recommended. 2. Extensive airspace disease in the lung bases bilaterally (left greater than right), concerning for potential aspiration pneumonia. 3. Cholelithiasis without evidence of acute cholecystitis at this time. 4. Chronic mild right hydronephrosis with urothelial thickening in the right renal collecting system, poorly evaluated on today's noncontrast CT examination, but similar in retrospect to prior studies. Nonemergent Urology consultation is recommended for further evaluation. 5. Atherosclerosis, including right coronary artery disease. 6. There are calcifications of the mitral valve and mitral annulus. Echocardiographic correlation for evaluation of potential valvular dysfunction may be warranted if clinically indicated. 7. Additional incidental findings, as above. Electronically Signed   By: Trudie Reed M.D.   On: 01/14/2016 22:13    Not all labs, radiology exams or other studies done during hospitalization come through on my EPIC note; however they are reviewed by me.    Assessment and Plan  Small bowel obstruction (HCC) CT abdomen and pelvis on the admission demonstrated small bowel obstruction with proximal ileum entrapment in the ventral hernia - Status post exploratory laparotomy, LOA, extensive debridement of skin and sq tissue and muscle, repair of incarcerated incisional hernia with biologic mesh, VAC application by Dr. Derrell Lolling 01/15/2016 for incarcerated incisional hernias, multiple, with small bowel obstruction ischemic fat necrosis of skin and subcutaneous tissue and fascia.  - Continue VAC dressing changes MWF - Antibiotic changed to Augmentin 01/29/2016; SNF - continue  Augmentin  for 14 days   Yeast UTI SNF - tx with diflucan and tx is finished  Acute respiratory failure with hypoxia (HCC) 1/23 CXR with pulmonary vascular congestion and possibly developing multifocal PNA vs HCAP and aspiration  SNF - Continue Lasix 20 mg daily along with potassium supplementation    Acute renal failure superimposed on stage 4 chronic kidney disease (HCC)  Baseline creatinine is 1.34 and on this admission acutely elevated at 2.15 likely in the setting of acute illness - Creatinine WNL SNF follow up BMP   Anemia associated with acute  blood loss Due to combination of CKD and postoperative blood loss anemia  - Hemoglobin is stable at 8.4 SNF - follow up CBC  Hyperlipidemia SNF - Continue Lipitor 10 mg at bedtime, omega 3 and fenofibrate    Hypomagnesemia SNF - continue repletion with Mgoxide  Morbid obesity due to excess calories (HCC) Body mass index is 47.81 kg/(m^2). SNF -dietary to set up with appropriate diet  Controlled type 2 diabetes mellitus with diabetic nephropathy, without long-term current use of insulin (HCC) A1c is 6.4, indicating good glycemic control  SNF - Continue Lantus 20 units at bedtime along with SSI  Essential hypertension, benign SNF - controlled ; Continue Lasix 20 mg daily - Continue Norvasc daily   Dyslipidemia associated with type 2 diabetes mellitus (HCC) SNF - Continue Lipitor 10 mg at bedtime, omega 3 and fenofibrate     Time spent > 2min;> 50% of time with patient was spent reviewing records, labs, tests and studies, counseling and developing plan of care  Margit Hanks, MD

## 2016-01-31 NOTE — Assessment & Plan Note (Signed)
A1c is 6.4, indicating good glycemic control  SNF - Continue Lantus 20 units at bedtime along with SSI

## 2016-01-31 NOTE — Assessment & Plan Note (Signed)
CT abdomen and pelvis on the admission demonstrated small bowel obstruction with proximal ileum entrapment in the ventral hernia - Status post exploratory laparotomy, LOA, extensive debridement of skin and sq tissue and muscle, repair of incarcerated incisional hernia with biologic mesh, VAC application by Dr. Derrell Lolling 01/15/2016 for incarcerated incisional hernias, multiple, with small bowel obstruction ischemic fat necrosis of skin and subcutaneous tissue and fascia.  - Continue VAC dressing changes MWF - Antibiotic changed to Augmentin 01/29/2016; SNF - continue Augmentin  for 14 days

## 2016-02-03 ENCOUNTER — Non-Acute Institutional Stay (SKILLED_NURSING_FACILITY): Payer: Medicare Other | Admitting: Internal Medicine

## 2016-02-03 ENCOUNTER — Encounter: Payer: Self-pay | Admitting: Internal Medicine

## 2016-02-03 DIAGNOSIS — R609 Edema, unspecified: Secondary | ICD-10-CM | POA: Insufficient documentation

## 2016-02-03 DIAGNOSIS — L03116 Cellulitis of left lower limb: Secondary | ICD-10-CM | POA: Diagnosis not present

## 2016-02-03 DIAGNOSIS — F411 Generalized anxiety disorder: Secondary | ICD-10-CM | POA: Diagnosis not present

## 2016-02-03 DIAGNOSIS — E876 Hypokalemia: Secondary | ICD-10-CM

## 2016-02-03 DIAGNOSIS — R600 Localized edema: Secondary | ICD-10-CM

## 2016-02-03 DIAGNOSIS — D62 Acute posthemorrhagic anemia: Secondary | ICD-10-CM | POA: Diagnosis not present

## 2016-02-03 DIAGNOSIS — L0390 Cellulitis, unspecified: Secondary | ICD-10-CM | POA: Insufficient documentation

## 2016-02-03 NOTE — Progress Notes (Signed)
Patient ID: Valerie [name], female   DOB: May 03, 1946, 70 y.o.   MRN: [id number] MRN: [id number] Name: Valerie [name]  Sex: female Age: 70 y.o. DOB: 11/25/1946  [facility] #: [facility] Facility/Room:103 Level Of Care: SNF Provider: Roena Malady Emergency Contacts: Extended Emergency Contact Information Primary Emergency Contact: [facility] Address: [address]          [place], [place] [address] Darden Amber of [place] Home Phone: [phone] Mobile Phone: [phone] Relation: Spouse Secondary Emergency Contact: Elijah Birk [place] of [place] Mobile Phone: [phone] Relation: Son  Code Status:   Allergies: Review of patient's allergies indicates no known allergies.  Chief Complaint  Patient presents with  . Acute Visit   secondary to left leg edema-erythema-anxiety  HPI: Patient is 70 y.o. female whooveractive bladder, chronic UTIs, necrotizing pancreatitis status post partial pancreatectomy, morbid obesity, type 2 diabetes mellitus, just recently discharged after being hospitalized for small bowel obstruction. Patient presented to [facility] 01/14/2016 with worsening nausea, vomiting and abdominal discomfort for past day. Pt was admitted to [facility] from 1/17-2/22 where she underwent surgery for SBO. Hospital course was complicated by acute respiratory failure with hypoxia, a yeast UTI and acute on chronic kidney failure.Pts admitted to SNF for generalized weakness. While at SNF pt  followed for DM2, tx with insulin, HLD, tx with lipitor and HTN, tx with norvasc and lasix.  Patient apparently had increased anxiety over the weekend-according nursing she was anxious about being in a "nursing home"-talking to patient she says it started all of a sudden and she  experiences at times in the hospital aw well--she states this is some white new and again occurs pretty much all of a sudden without warning-she did get a when necessary Ativan apparently over the weekend with some  relief.  She also has a history of some erythema of her left leg this is been treated previously with antibiotic-- as well as edema Doppler ultrasound in the hospital on December 10 was negative for any DVT at that time    Past Medical History  Diagnosis Date  . Hypertension   . Hyperlipidemia     Past Surgical History  Procedure Laterality Date  . Pancreas surgery  1985  . Cataract extraction w/ intraocular lens  implant, bilateral    . Laparotomy N/A 01/15/2016    Procedure: EXPLORATORY LAPAROTOMY, LYSIS OF ADHESIONS, EXTENSIVE DEBRIDEMENT OF SKIN AND SUBCUTANEOUS TISSUE AND MUSCLE, REPAIR OF INCARCERATED INCISION HERNIA WITH BIOLOGIC MESH, APPLICATION OF NEGATIVE PRESSURE DRESSING;  Surgeon: Claud Kelp, MD;  Location: [facility];  Service: General;  Laterality: N/A;      Medication List       This list is accurate as of: 02/03/16 11:59 PM.  Always use your most recent med list.               ALLEGRA PO  Take 1 tablet by mouth daily as needed (allergies).     amLODipine 10 MG tablet  Commonly known as:  NORVASC  TAKE 1 TABLET BY MOUTH EVERY DAY     amoxicillin-clavulanate 875-125 MG tablet  Commonly known as:  AUGMENTIN  Take 1 tablet by mouth 2 (two) times daily with a meal.     atorvastatin 10 MG tablet  Commonly known as:  LIPITOR  TAKE 1 TABLET BY MOUTH DAILY     CRANBERRY PO  Take 1 tablet by mouth 2 (two) times daily.     feeding supplement (GLUCERNA SHAKE) Liqd  Take 237 mLs by mouth  2 (two) times daily between meals.     fenofibrate 160 MG tablet  TAKE 1 TABLET BY MOUTH DAILY     Fish Oil 1000 MG Caps  Take 1 capsule by mouth daily.     furosemide 20 MG tablet  Commonly known as:  LASIX  Take 1 tablet (20 mg total) by mouth daily.     glucose blood test strip  Commonly known as:  ONE TOUCH ULTRA TEST  TEST EIGHT TIMES DAILY     hydrocerin Crea  Apply 1 application topically 2 (two) times daily.     insulin aspart 100 UNIT/ML injection   Commonly known as:  novoLOG  Inject 0-20 Units into the skin 3 (three) times daily with meals.     insulin glargine 100 UNIT/ML injection  Commonly known as:  LANTUS  Inject 0.2 mLs (20 Units total) into the skin at bedtime.     magnesium oxide 400 (241.3 Mg) MG tablet  Commonly known as:  MAG-OX  Take 1 tablet (400 mg total) by mouth daily.     methocarbamol 500 MG tablet  Commonly known as:  ROBAXIN  Take 2 tablets (1,000 mg total) by mouth every 8 (eight) hours as needed for muscle spasms.     mometasone 50 MCG/ACT nasal spray  Commonly known as:  NASONEX  Place 2 sprays into the nose daily as needed (allergies).     ondansetron 4 MG disintegrating tablet  Commonly known as:  ZOFRAN ODT  Take 1 tablet (4 mg total) by mouth every 8 (eight) hours as needed for nausea.     oxyCODONE 5 MG immediate release tablet  Commonly known as:  Oxy IR/ROXICODONE  Take 1 tablet (5 mg total) by mouth every 4 (four) hours as needed for moderate pain, severe pain or breakthrough pain.     pantoprazole 40 MG tablet  Commonly known as:  PROTONIX  Take 1 tablet (40 mg total) by mouth daily.     potassium chloride SA 20 MEQ tablet  Commonly known as:  K-DUR,KLOR-CON  Take 1 tablet (20 mEq total) by mouth daily.     senna-docusate 8.6-50 MG tablet  Commonly known as:  Senokot-S  Take 1 tablet by mouth 2 (two) times daily.     TYLENOL EX ST ARTHRITIS PAIN 500 MG tablet  Generic drug:  acetaminophen  Take 500 mg by mouth every 6 (six) hours as needed for mild pain or moderate pain.        No orders of the defined types were placed in this encounter.    Immunization History  Administered Date(s) Administered  . Influenza,inj,Quad PF,36+ Mos 09/18/2013, 09/27/2014, 09/20/2015  . Pneumococcal Conjugate-13 09/27/2014    Social History  Substance Use Topics  . Smoking status: Former Games developer  . Smokeless tobacco: Not on file  . Alcohol Use: No    Family history is  + CA  Review of  Systems  DATA OBTAINED: from nurse, husbandpaient GENERAL:  no fevers, fatigue, appetite changes SKIN: No itching, rash;healing surgical wound and abdomen again some erythema left lower leg EYES: No eye pain, redness, discharge EARS: No earache, tinnitus, change in hearing NOSE: No congestion, drainage or bleeding  MOUTH/THROAT: No mouth or tooth pain, No sore throat RESPIRATORY: No cough, wheezing, SOB CARDIAC: No chest pain, palpitations some left, lower extremity edema  GI: No abdominal pain, No N/V/D or constipation, No heartburn or reflux  GU: No dysuria, frequency or urgency, or incontinence  MUSCULOSKELETAL: no unrelieved pain  NEUROLOGIC: No headache, dizziness or focal weakness PSYCHIATRIC: Increased anxiety as noted above   Filed Vitals:   02/03/16 1206  BP: 131/76  Pulse: 81  Temp: 98.7 F (37.1 C)  Resp: 18          Physical Exam  GENERAL APPEARANCE: Alert,  conversant,  No acute distress.  SKIN: No diaphoresis rash there is some erythema left shin area this is somewhat warm to time--she also has a small ulcer on her second toe HEAD: Normocephalic, atraumatic  EYES: Conjunctiva/lids clear. Pupils round, reactive. EOMs intact.  EARS: External exam WNL, canals clear. Hearing grossly normal.  NOSE: No deformity or discharge.  MOUTH/THROAT: Lips w/o lesions  RESPIRATORY: Breathing is even, unlabored. Lung sounds are clear   CARDIOVASCULAR: Heart RRR with an occasional irregular beat no murmurs, rubs or gallops. Recent edema on the right somewhat more edema on the left pedal pulses intact GASTROINTESTINAL: Abdomen is soft, non-tender, not distended w/ normal bowel sounds; large mid low abd wound dressed withou infection.  MUSCULOSKELETAL: No abnormal joints or musculature NEUROLOGIC:  Cranial nerves 2-12 grossly intact. Moves all extremities  PSYCHIATRIC: Pleasant conversant-does not appear anxious currently but apparently this happens with increasing  frequency.  Actually when I went back to reassess patient later in the day she idt appear to be somewhat more anxious-her husband had just helped her to the restroom-and apparently there was anxiety prompted apparently by the ambulating iandtrying to get back in bed  Patient Active Problem List   Diagnosis Date Noted  . Hypokalemia 02/03/2016  . Anxiety state 02/03/2016  . Edema 02/03/2016  . Cellulitis 02/03/2016  . Acute respiratory failure with hypoxia (HCC) 01/31/2016  . Pressure ulcer 01/27/2016  . Hypomagnesemia 01/19/2016  . Small bowel obstruction (HCC) 01/17/2016  . Acute renal failure superimposed on stage 4 chronic kidney disease (HCC) 01/17/2016  . Anemia of chronic disease 01/17/2016  . Anemia associated with acute blood loss 01/17/2016  . Controlled type 2 diabetes mellitus with diabetic nephropathy, without long-term current use of insulin (HCC) 01/17/2016  . Morbid obesity due to excess calories (HCC) 01/17/2016  . Yeast UTI 01/17/2016  . Incarcerated hernia 01/17/2016  . Dyslipidemia associated with type 2 diabetes mellitus (HCC) 01/17/2016  . Lower urinary tract infection 09/25/2014  . Essential hypertension, benign 03/07/2014  . Hyperlipidemia 09/12/2013    CBC    Component Value Date/Time   WBC 4.2 01/29/2016 0553   RBC 3.01* 01/29/2016 0553   HGB 8.4* 01/29/2016 0553   HCT 27.6* 01/29/2016 0553   PLT 214 01/29/2016 0553   MCV 91.7 01/29/2016 0553   LYMPHSABS 0.6* 01/15/2016 0142   MONOABS 0.3 01/15/2016 0142   EOSABS 0.1 01/15/2016 0142   BASOSABS 0.0 01/15/2016 0142    CMP     Component Value Date/Time   NA 139 01/30/2016 0520   K 3.7 01/30/2016 0520   CL 100* 01/30/2016 0520   CO2 29 01/30/2016 0520   GLUCOSE 214* 01/30/2016 0520   BUN 12 01/30/2016 0520   CREATININE 0.88 01/30/2016 0520   CREATININE 1.27* 03/07/2014 0945   CALCIUM 9.0 01/30/2016 0520   PROT 6.5 01/15/2016 0142   ALBUMIN 2.7* 01/15/2016 0142   AST 16 01/15/2016 0142    ALT 13* 01/15/2016 0142   ALKPHOS 61 01/15/2016 0142   BILITOT 1.1 01/15/2016 0142   GFRNONAA >60 01/30/2016 0520   GFRAA >60 01/30/2016 0520    Lab Results  Component Value Date   HGBA1C 6.4* 01/06/2016  Ct Abdomen Pelvis Wo Contrast  01/14/2016  CLINICAL DATA:  70 year old female with history of abdominal pain most severe in the right lower quadrant. Three episodes of emesis today. EXAM: CT ABDOMEN AND PELVIS WITHOUT CONTRAST TECHNIQUE: Multidetector CT imaging of the abdomen and pelvis was performed following the standard protocol without IV contrast. COMPARISON:  CT the abdomen and pelvis 01/06/2016. FINDINGS: Lower chest: Airspace consolidation in the lung bases bilaterally, most severe in the left lower lobe, concerning for sequela of recent aspiration. Severe calcifications of the mitral valve and mitral annulus. Atherosclerotic calcifications in the right coronary artery. Hepatobiliary: No definite cystic or solid hepatic lesions are noted on today's noncontrast CT examination. Multiple small calcified gallstones lying dependently in the gallbladder. No evidence to suggest an acute cholecystitis at this time. Pancreas: No definite pancreatic mass or peripancreatic inflammatory changes on today's noncontrast CT examination. Spleen: Unremarkable. Adrenals/Urinary Tract: Left kidney is normal in appearance. Calcification in the medial limb of the left adrenal gland likely related to remote hemorrhage or infection. 2.8 x 1.6 cm indeterminate right adrenal nodule is similar to remote prior study 05/23/2013, favored to represent a benign lesion such as a lipid poor adenoma. Right kidney is remarkable for apparent urothelial thickening and slight hydroureteronephrosis. There are no calcifications identified within the collecting system of the right kidney, along the course of the right ureter or within the lumen of the urinary bladder. No right-sided hydroureter. Urinary bladder is nearly completely  decompressed, but otherwise unremarkable in appearance. Stomach/Bowel: The appearance of the stomach is unremarkable. Multiple dilated loops of small bowel are noted measuring up to 5.6 cm in diameter, and there multiple air-fluid levels noted. This affects predominantly the proximal and mid small bowel extending to the level of the proximal ileum, where there is a small ventral hernia containing a short segment of the proximal ileum, beyond which the distal small bowel appears completely decompressed. Colon is relatively decompressed as well. Numerous colonic diverticulae are noted, without surrounding inflammatory changes to suggest an acute diverticulitis at this time. Vascular/Lymphatic: Atherosclerosis throughout the abdominal and pelvic vasculature, without evidence of aneurysm or dissection. No lymphadenopathy noted in the abdomen or pelvis on today's noncontrast CT examination. Reproductive: Uterus and ovaries are atrophic. Other: No significant volume of ascites.  No pneumoperitoneum. Musculoskeletal: Extensive skin thickening in the subcutaneous fat of the patient's large pannus, with there multiple dystrophic calcifications. These findings appear to be chronic and are similar to the prior study. There are no aggressive appearing lytic or blastic lesions noted in the visualized portions of the skeleton. IMPRESSION: 1. Small bowel obstruction related to entrapment of proximal ileum in a small ventral hernia. Proximal small bowel is dilated with multiple air-fluid levels measuring up to 5.6 cm in diameter. Distal small bowel is completely decompressed, and colon is largely decompressed as well. Emergent surgical consultation is strongly recommended. 2. Extensive airspace disease in the lung bases bilaterally (left greater than right), concerning for potential aspiration pneumonia. 3. Cholelithiasis without evidence of acute cholecystitis at this time. 4. Chronic mild right hydronephrosis with urothelial  thickening in the right renal collecting system, poorly evaluated on today's noncontrast CT examination, but similar in retrospect to prior studies. Nonemergent Urology consultation is recommended for further evaluation. 5. Atherosclerosis, including right coronary artery disease. 6. There are calcifications of the mitral valve and mitral annulus. Echocardiographic correlation for evaluation of potential valvular dysfunction may be warranted if clinically indicated. 7. Additional incidental findings, as above. Electronically Signed   By:  Trudie Reed M.D.   On: 01/14/2016 22:13    Not all labs, radiology exams or other studies done during hospitalization come through on my EPIC note; however they are reviewed by me.    Assessment and Plan  1 history of some increased left lower extremity edema and mild erythema-apparently erythema has increased per discussion with her husband at bedside-she continues on Augmentin-will add doxycycline 100 mg twice a day and monitor this.  In regards to increased edema she did have a Doppler ultrasound of her left leg done back in December would like to repeat this however since apparently edema has increased somewhat.  History of small bowel obstruction-he appears to be doing well with her rehabilitation here she is status post laparoscopic out of me and extensive debridement of the skin as well as tissue and muscle repair of an incarcerated incisional hernia with biological mesh.  This is followed by Dr. Arville Go continues with a wound VAC.  Continues on Augmentin.  History of acute renal failure with stage IV chronic kidney disease-most recent creatinine was 0.88 BUN of 12 on February 2 will update this.  History of anxiety-as noted above this appears to be somewhat more persistent she has received when necessary Ativan wil start Xanax low-dose 0.25 mg twice a day and monitor this.  History of anemia with acute blood loss-the combination of chronic  kidney disease and postop blood loss-hemoglobin 8.4 on February 1 update this as well.  [id number]  Herny Scurlock C,

## 2016-02-04 ENCOUNTER — Other Ambulatory Visit: Payer: Self-pay | Admitting: Internal Medicine

## 2016-02-04 DIAGNOSIS — I82402 Acute embolism and thrombosis of unspecified deep veins of left lower extremity: Secondary | ICD-10-CM

## 2016-02-05 ENCOUNTER — Encounter: Payer: Self-pay | Admitting: Internal Medicine

## 2016-02-05 ENCOUNTER — Non-Acute Institutional Stay (SKILLED_NURSING_FACILITY): Payer: Medicare Other | Admitting: Internal Medicine

## 2016-02-05 ENCOUNTER — Other Ambulatory Visit: Payer: Self-pay | Admitting: Internal Medicine

## 2016-02-05 DIAGNOSIS — E1121 Type 2 diabetes mellitus with diabetic nephropathy: Secondary | ICD-10-CM | POA: Diagnosis not present

## 2016-02-05 DIAGNOSIS — Z794 Long term (current) use of insulin: Secondary | ICD-10-CM

## 2016-02-05 DIAGNOSIS — R609 Edema, unspecified: Secondary | ICD-10-CM

## 2016-02-05 NOTE — Progress Notes (Signed)
MRN: [id number] Name: Valerie [name]  Sex: female Age: 70 y.o. DOB: 1946-02-02  [facility] Facility/Room: 109 Level Of Care: SNF Provider: Merrilee Seashore D Emergency Contacts: Extended Emergency Contact Information Primary Emergency Contact: [facility] Address: [address]          [place], [place] [address] Darden Amber of [place] Home Phone: [phone] Mobile Phone: [phone] Relation: Spouse Secondary Emergency Contact: Elijah Birk [place] of [place] Mobile Phone: [phone] Relation: Son  Code Status:   Allergies: Review of patient's allergies indicates no known allergies.  Chief Complaint  Patient presents with  . Acute Visit    HPI: Patient is 70 y.o. female whois being seen today for continued BS elevations on the regimen she came from the hospital with. Pt can not remember what her regimen was at home.    Past Medical History  Diagnosis Date  . Hypertension   . Hyperlipidemia   . Diabetes mellitus without complication [facility])     Past Surgical History  Procedure Laterality Date  . Pancreas surgery  1985  . Cataract extraction w/ intraocular lens  implant, bilateral    . Laparotomy N/A 01/15/2016    Procedure: EXPLORATORY LAPAROTOMY, LYSIS OF ADHESIONS, EXTENSIVE DEBRIDEMENT OF SKIN AND SUBCUTANEOUS TISSUE AND MUSCLE, REPAIR OF INCARCERATED INCISION HERNIA WITH BIOLOGIC MESH, APPLICATION OF NEGATIVE PRESSURE DRESSING;  Surgeon: Claud Kelp, MD;  Location: [facility];  Service: General;  Laterality: N/A;      Medication List       This list is accurate as of: 02/05/16 11:59 PM.  Always use your most recent med list.               ALLEGRA PO  Take 1 tablet by mouth daily as needed (allergies).     amLODipine 10 MG tablet  Commonly known as:  NORVASC  TAKE 1 TABLET BY MOUTH EVERY DAY     amoxicillin-clavulanate 875-125 MG tablet  Commonly known as:  AUGMENTIN  Take 1 tablet by mouth 2 (two) times daily with a meal.     atorvastatin  10 MG tablet  Commonly known as:  LIPITOR  TAKE 1 TABLET BY MOUTH DAILY     CRANBERRY PO  Take 1 tablet by mouth 2 (two) times daily.     feeding supplement (GLUCERNA SHAKE) Liqd  Take 237 mLs by mouth 2 (two) times daily between meals.     fenofibrate 160 MG tablet  TAKE 1 TABLET BY MOUTH DAILY     Fish Oil 1000 MG Caps  Take 1 capsule by mouth daily.     furosemide 20 MG tablet  Commonly known as:  LASIX  Take 1 tablet (20 mg total) by mouth daily.     glucose blood test strip  Commonly known as:  ONE TOUCH ULTRA TEST  TEST EIGHT TIMES DAILY     hydrocerin Crea  Apply 1 application topically 2 (two) times daily.     insulin aspart 100 UNIT/ML injection  Commonly known as:  novoLOG  Inject 0-20 Units into the skin 3 (three) times daily with meals.     insulin glargine 100 UNIT/ML injection  Commonly known as:  LANTUS  Inject 0.2 mLs (20 Units total) into the skin at bedtime.     magnesium oxide 400 (241.3 Mg) MG tablet  Commonly known as:  MAG-OX  Take 1 tablet (400 mg total) by mouth daily.     methocarbamol 500 MG tablet  Commonly known as:  ROBAXIN  Take 2  tablets (1,000 mg total) by mouth every 8 (eight) hours as needed for muscle spasms.     mometasone 50 MCG/ACT nasal spray  Commonly known as:  NASONEX  Place 2 sprays into the nose daily as needed (allergies).     ondansetron 4 MG disintegrating tablet  Commonly known as:  ZOFRAN ODT  Take 1 tablet (4 mg total) by mouth every 8 (eight) hours as needed for nausea.     oxyCODONE 5 MG immediate release tablet  Commonly known as:  Oxy IR/ROXICODONE  Take 1 tablet (5 mg total) by mouth every 4 (four) hours as needed for moderate pain, severe pain or breakthrough pain.     pantoprazole 40 MG tablet  Commonly known as:  PROTONIX  Take 1 tablet (40 mg total) by mouth daily.     potassium chloride SA 20 MEQ tablet  Commonly known as:  K-DUR,KLOR-CON  Take 1 tablet (20 mEq total) by mouth daily.      senna-docusate 8.6-50 MG tablet  Commonly known as:  Senokot-S  Take 1 tablet by mouth 2 (two) times daily.     TYLENOL EX ST ARTHRITIS PAIN 500 MG tablet  Generic drug:  acetaminophen  Take 500 mg by mouth every 6 (six) hours as needed for mild pain or moderate pain.        No orders of the defined types were placed in this encounter.    Immunization History  Administered Date(s) Administered  . Influenza,inj,Quad PF,36+ Mos 09/18/2013, 09/27/2014, 09/20/2015  . Pneumococcal Conjugate-13 09/27/2014    Social History  Substance Use Topics  . Smoking status: Former Games developer  . Smokeless tobacco: Not on file  . Alcohol Use: No    Review of Systems  DATA OBTAINED: from patient, nurse GENERAL:  no fevers, fatigue, appetite changes- BS is too high SKIN: No itching, rash HEENT: No complaint RESPIRATORY: No cough, wheezing, SOB CARDIAC: No chest pain, palpitations, lower extremity edema  GI: No abdominal pain, No N/V/D or constipation, No heartburn or reflux  GU: No dysuria, frequency or urgency, or incontinence  MUSCULOSKELETAL: No unrelieved bone/joint pain NEUROLOGIC: No headache, dizziness  PSYCHIATRIC: No overt anxiety or sadness  Filed Vitals:   02/05/16 1235  BP: 144/87  Pulse: 106  Temp: 98.5 F (36.9 C)  Resp: 20    Physical Exam  GENERAL APPEARANCE: Alert, conversant, No acute distress  SKIN: No diaphoresis rash, or wounds HEENT: Unremarkable RESPIRATORY: Breathing is even, unlabored. Lung sounds are clear   CARDIOVASCULAR: Heart RRR no murmurs, rubs or gallops. No peripheral edema  GASTROINTESTINAL: Abdomen is soft, non-tender, not distended w/ normal bowel sounds.  GENITOURINARY: Bladder non tender, not distended  MUSCULOSKELETAL: No abnormal joints or musculature NEUROLOGIC: Cranial nerves 2-12 grossly intact. Moves all extremities PSYCHIATRIC: Mood and affect appropriate to situation, no behavioral issues  Patient Active Problem List   Diagnosis  Date Noted  . Hypokalemia 02/03/2016  . Anxiety state 02/03/2016  . Edema 02/03/2016  . Cellulitis 02/03/2016  . Acute respiratory failure with hypoxia (HCC) 01/31/2016  . Pressure ulcer 01/27/2016  . Hypomagnesemia 01/19/2016  . Small bowel obstruction (HCC) 01/17/2016  . Acute renal failure superimposed on stage 4 chronic kidney disease (HCC) 01/17/2016  . Anemia of chronic disease 01/17/2016  . Anemia associated with acute blood loss 01/17/2016  . Controlled type 2 diabetes mellitus with diabetic nephropathy, with long-term current use of insulin (HCC) 01/17/2016  . Morbid obesity due to excess calories (HCC) 01/17/2016  . Yeast UTI 01/17/2016  .  Incarcerated hernia 01/17/2016  . Dyslipidemia associated with type 2 diabetes mellitus (HCC) 01/17/2016  . Lower urinary tract infection 09/25/2014  . Essential hypertension, benign 03/07/2014  . Hyperlipidemia 09/12/2013    CBC    Component Value Date/Time   WBC 4.2 01/29/2016 0553   RBC 3.01* 01/29/2016 0553   HGB 8.4* 01/29/2016 0553   HCT 27.6* 01/29/2016 0553   PLT 214 01/29/2016 0553   MCV 91.7 01/29/2016 0553   LYMPHSABS 0.6* 01/15/2016 0142   MONOABS 0.3 01/15/2016 0142   EOSABS 0.1 01/15/2016 0142   BASOSABS 0.0 01/15/2016 0142    CMP     Component Value Date/Time   NA 139 01/30/2016 0520   K 3.7 01/30/2016 0520   CL 100* 01/30/2016 0520   CO2 29 01/30/2016 0520   GLUCOSE 214* 01/30/2016 0520   BUN 12 01/30/2016 0520   CREATININE 0.88 01/30/2016 0520   CREATININE 1.27* 03/07/2014 0945   CALCIUM 9.0 01/30/2016 0520   PROT 6.5 01/15/2016 0142   ALBUMIN 2.7* 01/15/2016 0142   AST 16 01/15/2016 0142   ALT 13* 01/15/2016 0142   ALKPHOS 61 01/15/2016 0142   BILITOT 1.1 01/15/2016 0142   GFRNONAA >60 01/30/2016 0520   GFRAA >60 01/30/2016 0520    Assessment and Plan  Controlled type 2 diabetes mellitus with diabetic nephropathy, with long-term current use of insulin (HCC) I have evaluated all of pt's BS  since admit. A little lower in the am ut high across the board. I will be conservative, the WE is coming and i don't want her too low. Will inc lantus to 30 u qHS and leave SSI the same.  Will re eval first of next week.    Margit Hanks, MD

## 2016-02-06 ENCOUNTER — Ambulatory Visit (HOSPITAL_COMMUNITY)
Admission: RE | Admit: 2016-02-06 | Discharge: 2016-02-06 | Disposition: A | Discharge status: Home / Self Care | Payer: Medicare Other | Source: Ambulatory Visit | Attending: Family Medicine | Admitting: Family Medicine

## 2016-02-06 DIAGNOSIS — R609 Edema, unspecified: Secondary | ICD-10-CM | POA: Diagnosis not present

## 2016-02-06 NOTE — Progress Notes (Signed)
VASCULAR LAB PRELIMINARY  PRELIMINARY  PRELIMINARY  PRELIMINARY   Left lower extremity venous Doppler has been completed.  Left:  No evidence of DVT, superficial thrombosis, or Baker's cyst.  completed.   Called Dr. Lyn Hollingshead and left message.  Jenetta Loges, RVT, RDMS 02/06/2016, 2:32 PM

## 2016-02-07 ENCOUNTER — Non-Acute Institutional Stay (SKILLED_NURSING_FACILITY): Payer: Medicare Other | Admitting: Internal Medicine

## 2016-02-07 ENCOUNTER — Encounter: Payer: Self-pay | Admitting: Internal Medicine

## 2016-02-07 DIAGNOSIS — Z794 Long term (current) use of insulin: Secondary | ICD-10-CM

## 2016-02-07 DIAGNOSIS — E1121 Type 2 diabetes mellitus with diabetic nephropathy: Secondary | ICD-10-CM

## 2016-02-07 NOTE — Progress Notes (Addendum)
MRN: [id number] Name: Valerie [name]  Sex: female Age: 70 y.o. DOB: May 18, 1946  [facility] Facility/Room:103 Level Of Care: SNF Provider: Merrilee Seashore D Emergency Contacts: Extended Emergency Contact Information Primary Emergency Contact: [facility] Address: [address]          [place], [place] [address] Darden [place] of [place] Home Phone: [phone] Mobile Phone: [phone] Relation: Spouse Secondary Emergency Contact: Elijah Birk [place] Mobile Phone: [phone] Relation: Son  Code Status:   Allergies: Review of patient's allergies indicates no known allergies.  Chief Complaint  Patient presents with  . Medical Management of Chronic Issues    HPI: Patient is 70 y.o. female who is being seen today for continued BS elevation after being increased by 10 units of lantus; BS are still high and today pt is very upset. Again, pt cannot remember her regimen from home.   Past Medical History  Diagnosis Date  . Hypertension   . Hyperlipidemia   . Diabetes mellitus without complication [facility])     Past Surgical History  Procedure Laterality Date  . Pancreas surgery  1985  . Cataract extraction w/ intraocular lens  implant, bilateral    . Laparotomy N/A 01/15/2016    Procedure: EXPLORATORY LAPAROTOMY, LYSIS OF ADHESIONS, EXTENSIVE DEBRIDEMENT OF SKIN AND SUBCUTANEOUS TISSUE AND MUSCLE, REPAIR OF INCARCERATED INCISION HERNIA WITH BIOLOGIC MESH, APPLICATION OF NEGATIVE PRESSURE DRESSING;  Surgeon: Claud Kelp, MD;  Location: [facility];  Service: General;  Laterality: N/A;      Medication List       This list is accurate as of: 02/07/16 11:59 PM.  Always use your most recent med list.               ALLEGRA PO  Take 1 tablet by mouth daily as needed (allergies).     amLODipine 10 MG tablet  Commonly known as:  NORVASC  TAKE 1 TABLET BY MOUTH EVERY DAY     amoxicillin-clavulanate 875-125 MG tablet  Commonly known as:  AUGMENTIN  Take 1  tablet by mouth 2 (two) times daily with a meal.     atorvastatin 10 MG tablet  Commonly known as:  LIPITOR  TAKE 1 TABLET BY MOUTH DAILY     CRANBERRY PO  Take 1 tablet by mouth 2 (two) times daily.     feeding supplement (GLUCERNA SHAKE) Liqd  Take 237 mLs by mouth 2 (two) times daily between meals.     fenofibrate 160 MG tablet  TAKE 1 TABLET BY MOUTH DAILY     Fish Oil 1000 MG Caps  Take 1 capsule by mouth daily.     furosemide 20 MG tablet  Commonly known as:  LASIX  Take 1 tablet (20 mg total) by mouth daily.     glucose blood test strip  Commonly known as:  ONE TOUCH ULTRA TEST  TEST EIGHT TIMES DAILY     hydrocerin Crea  Apply 1 application topically 2 (two) times daily.     insulin aspart 100 UNIT/ML injection  Commonly known as:  novoLOG  Inject 0-20 Units into the skin 3 (three) times daily with meals.     insulin glargine 100 UNIT/ML injection  Commonly known as:  LANTUS  Inject 0.2 mLs (20 Units total) into the skin at bedtime.     magnesium oxide 400 (241.3 Mg) MG tablet  Commonly known as:  MAG-OX  Take 1 tablet (400 mg total) by mouth daily.     methocarbamol 500 MG tablet  Commonly known as:  ROBAXIN  Take 2 tablets (1,000 mg total) by mouth every 8 (eight) hours as needed for muscle spasms.     mometasone 50 MCG/ACT nasal spray  Commonly known as:  NASONEX  Place 2 sprays into the nose daily as needed (allergies).     ondansetron 4 MG disintegrating tablet  Commonly known as:  ZOFRAN ODT  Take 1 tablet (4 mg total) by mouth every 8 (eight) hours as needed for nausea.     oxyCODONE 5 MG immediate release tablet  Commonly known as:  Oxy IR/ROXICODONE  Take 1 tablet (5 mg total) by mouth every 4 (four) hours as needed for moderate pain, severe pain or breakthrough pain.     pantoprazole 40 MG tablet  Commonly known as:  PROTONIX  Take 1 tablet (40 mg total) by mouth daily.     potassium chloride SA 20 MEQ tablet  Commonly known as:   K-DUR,KLOR-CON  Take 1 tablet (20 mEq total) by mouth daily.     senna-docusate 8.6-50 MG tablet  Commonly known as:  Senokot-S  Take 1 tablet by mouth 2 (two) times daily.     TYLENOL EX ST ARTHRITIS PAIN 500 MG tablet  Generic drug:  acetaminophen  Take 500 mg by mouth every 6 (six) hours as needed for mild pain or moderate pain.        No orders of the defined types were placed in this encounter.    Immunization History  Administered Date(s) Administered  . Influenza,inj,Quad PF,36+ Mos 09/18/2013, 09/27/2014, 09/20/2015  . Pneumococcal Conjugate-13 09/27/2014    Social History  Substance Use Topics  . Smoking status: Former Games developer  . Smokeless tobacco: Not on file  . Alcohol Use: No    Review of Systems  DATA OBTAINED: from patient, nurse GENERAL:  no fevers, fatigue, appetite changes- bs TOO HIGH SKIN: No itching, rash HEENT: No complaint RESPIRATORY: No cough, wheezing, SOB CARDIAC: No chest pain, palpitations, lower extremity edema  GI: No abdominal pain, No N/V/D or constipation, No heartburn or reflux  GU: No dysuria, frequency or urgency, or incontinence  MUSCULOSKELETAL: No unrelieved bone/joint pain NEUROLOGIC: No headache, dizziness  PSYCHIATRIC: No overt anxiety or sadness  Filed Vitals:   02/07/16 1519  BP: 118/68  Pulse: 78  Temp: 99 F (37.2 C)  Resp: 20    Physical Exam  GENERAL APPEARANCE: Alert, conversant, No acute distress  SKIN: No diaphoresis rash, or wounds HEENT: Unremarkable RESPIRATORY: Breathing is even, unlabored. Lung sounds are clear   CARDIOVASCULAR: Heart RRR no murmurs, rubs or gallops. No peripheral edema  GASTROINTESTINAL: Abdomen is soft, non-tender, not distended w/ normal bowel sounds.  GENITOURINARY: Bladder non tender, not distended  MUSCULOSKELETAL: No abnormal joints or musculature NEUROLOGIC: Cranial nerves 2-12 grossly intact. Moves all extremities PSYCHIATRIC: Mood and affect appropriate to situation, no  behavioral issues  Patient Active Problem List   Diagnosis Date Noted  . Hypokalemia 02/03/2016  . Anxiety state 02/03/2016  . Edema 02/03/2016  . Cellulitis 02/03/2016  . Acute respiratory failure with hypoxia (HCC) 01/31/2016  . Pressure ulcer 01/27/2016  . Hypomagnesemia 01/19/2016  . Small bowel obstruction (HCC) 01/17/2016  . Acute renal failure superimposed on stage 4 chronic kidney disease (HCC) 01/17/2016  . Anemia of chronic disease 01/17/2016  . Anemia associated with acute blood loss 01/17/2016  . Controlled type 2 diabetes mellitus with diabetic nephropathy, with long-term current use of insulin (HCC) 01/17/2016  . Morbid obesity due to excess calories (  HCC) 01/17/2016  . Yeast UTI 01/17/2016  . Incarcerated hernia 01/17/2016  . Dyslipidemia associated with type 2 diabetes mellitus (HCC) 01/17/2016  . Lower urinary tract infection 09/25/2014  . Essential hypertension, benign 03/07/2014  . Hyperlipidemia 09/12/2013    CBC    Component Value Date/Time   WBC 4.2 01/29/2016 0553   RBC 3.01* 01/29/2016 0553   HGB 8.4* 01/29/2016 0553   HCT 27.6* 01/29/2016 0553   PLT 214 01/29/2016 0553   MCV 91.7 01/29/2016 0553   LYMPHSABS 0.6* 01/15/2016 0142   MONOABS 0.3 01/15/2016 0142   EOSABS 0.1 01/15/2016 0142   BASOSABS 0.0 01/15/2016 0142    CMP     Component Value Date/Time   NA 139 01/30/2016 0520   K 3.7 01/30/2016 0520   CL 100* 01/30/2016 0520   CO2 29 01/30/2016 0520   GLUCOSE 214* 01/30/2016 0520   BUN 12 01/30/2016 0520   CREATININE 0.88 01/30/2016 0520   CREATININE 1.27* 03/07/2014 0945   CALCIUM 9.0 01/30/2016 0520   PROT 6.5 01/15/2016 0142   ALBUMIN 2.7* 01/15/2016 0142   AST 16 01/15/2016 0142   ALT 13* 01/15/2016 0142   ALKPHOS 61 01/15/2016 0142   BILITOT 1.1 01/15/2016 0142   GFRNONAA >60 01/30/2016 0520   GFRAA >60 01/30/2016 0520    Assessment and Plan  Controlled type 2 diabetes mellitus with diabetic nephropathy, with long-term  current use of insulin (HCC) The 10 u of  lantus increase barely touched pt's BS. IWill increase lantus to 35u and go to the resistant SSI. When reviewing this with pt she remembered that she got 20 u for BS of 400 at a meal at home .  so this fitsShe still cant remember her lantus dose. I cant understand why 20 u lantus held her in the hospital and is wholey inadequate here. Will re-eval next week    Margit Hanks, MD

## 2016-02-09 ENCOUNTER — Encounter: Payer: Self-pay | Admitting: Internal Medicine

## 2016-02-09 NOTE — Assessment & Plan Note (Addendum)
I have evaluated all of pt's BS since admit. A little lower in the am ut high across the board. I will be conservative, the WE is coming and i don't want her too low. Will inc lantus to 30 u qHS and leave SSI the same.  Will re eval first of next week.

## 2016-02-09 NOTE — Assessment & Plan Note (Addendum)
The 10 u of  lantus increase barely touched pt's BS. IWill increase lantus to 35u and go to the resistant SSI. When reviewing this with pt she remembered that she got 20 u for BS of 400 at a meal at home .  so this fitsShe still cant remember her lantus dose. I cant understand why 20 u lantus held her in the hospital and is wholey inadequate here. Will re-eval next week  The reason pt is so upset is because her eye doctor told her if her BS ran higher than 200 she would go blind. She thought she would go blind this week.

## 2016-02-14 ENCOUNTER — Encounter: Payer: Self-pay | Admitting: Internal Medicine

## 2016-02-14 ENCOUNTER — Non-Acute Institutional Stay (SKILLED_NURSING_FACILITY): Payer: Medicare Other | Admitting: Internal Medicine

## 2016-02-14 DIAGNOSIS — Z794 Long term (current) use of insulin: Secondary | ICD-10-CM

## 2016-02-14 DIAGNOSIS — D62 Acute posthemorrhagic anemia: Secondary | ICD-10-CM

## 2016-02-14 DIAGNOSIS — J9601 Acute respiratory failure with hypoxia: Secondary | ICD-10-CM

## 2016-02-14 DIAGNOSIS — N179 Acute kidney failure, unspecified: Secondary | ICD-10-CM

## 2016-02-14 DIAGNOSIS — K5669 Other intestinal obstruction: Secondary | ICD-10-CM | POA: Diagnosis not present

## 2016-02-14 DIAGNOSIS — I10 Essential (primary) hypertension: Secondary | ICD-10-CM

## 2016-02-14 DIAGNOSIS — E876 Hypokalemia: Secondary | ICD-10-CM

## 2016-02-14 DIAGNOSIS — K56609 Unspecified intestinal obstruction, unspecified as to partial versus complete obstruction: Secondary | ICD-10-CM

## 2016-02-14 DIAGNOSIS — E1121 Type 2 diabetes mellitus with diabetic nephropathy: Secondary | ICD-10-CM | POA: Diagnosis not present

## 2016-02-14 DIAGNOSIS — B3749 Other urogenital candidiasis: Secondary | ICD-10-CM

## 2016-02-14 DIAGNOSIS — E1169 Type 2 diabetes mellitus with other specified complication: Secondary | ICD-10-CM | POA: Diagnosis not present

## 2016-02-14 DIAGNOSIS — E785 Hyperlipidemia, unspecified: Secondary | ICD-10-CM

## 2016-02-14 DIAGNOSIS — N184 Chronic kidney disease, stage 4 (severe): Secondary | ICD-10-CM

## 2016-02-14 DIAGNOSIS — F411 Generalized anxiety disorder: Secondary | ICD-10-CM | POA: Diagnosis not present

## 2016-02-14 NOTE — Progress Notes (Signed)
MRN: [id number] Name: Valerie [name]  Sex: female Age: 70 y.o. DOB: Sep 12, 1946  [facility] Facility/Room:103 Level Of Care: SNF Provider: Merrilee Seashore D Emergency Contacts: Extended Emergency Contact Information Primary Emergency Contact: [facility] Address: [address]          [place], [place] [address] Darden [place] of [place] Home Phone: [phone] Mobile Phone: [phone] Relation: Spouse Secondary Emergency Contact: Elijah Birk [place] Mobile Phone: [phone] Relation: Son  Code Status:   Allergies: Review of patient's allergies indicates no known allergies.  Chief Complaint  Patient presents with  . Discharge Note    HPI: Patient is 70 y.o. female with overactive bladder, chronic UTIs, necrotizing pancreatitis status post partial pancreatectomy, morbid obesity, type 2 diabetes mellitus, just recently discharged after being hospitalized for small bowel obstruction. Pt was admitted to [facility] from 1/17-2/22 where she underwent surgery for SBO. Hospital course was complicated by acute respiratory failure with hypoxia, a yeast UTI and acute on chronic kidney failure.Pt was admitted to SNF for generalized weakness for OT/PT and for care of her drain. Pt wants to go home today if SW can get someone to follow her drains at home. Her stay here has been eventful only for BS control. Her lantus has been increased from 20 to 38 u Qhs and moderate SSI to resistant SSI and BS are well controlled now.  Past Medical History  Diagnosis Date  . Hypertension   . Hyperlipidemia   . Diabetes mellitus without complication [facility])     Past Surgical History  Procedure Laterality Date  . Pancreas surgery  1985  . Cataract extraction w/ intraocular lens  implant, bilateral    . Laparotomy N/A 01/15/2016    Procedure: EXPLORATORY LAPAROTOMY, LYSIS OF ADHESIONS, EXTENSIVE DEBRIDEMENT OF SKIN AND SUBCUTANEOUS TISSUE AND MUSCLE, REPAIR OF INCARCERATED INCISION HERNIA  WITH BIOLOGIC MESH, APPLICATION OF NEGATIVE PRESSURE DRESSING;  Surgeon: Claud Kelp, MD;  Location: [facility];  Service: General;  Laterality: N/A;      Medication List       This list is accurate as of: 02/14/16 12:16 PM.  Always use your most recent med list.               ALLEGRA PO  Take 1 tablet by mouth daily as needed (allergies).     amLODipine 10 MG tablet  Commonly known as:  NORVASC  TAKE 1 TABLET BY MOUTH EVERY DAY     amoxicillin-clavulanate 875-125 MG tablet  Commonly known as:  AUGMENTIN  Take 1 tablet by mouth 2 (two) times daily with a meal.     atorvastatin 10 MG tablet  Commonly known as:  LIPITOR  TAKE 1 TABLET BY MOUTH DAILY     CRANBERRY PO  Take 1 tablet by mouth 2 (two) times daily.     feeding supplement (GLUCERNA SHAKE) Liqd  Take 237 mLs by mouth 2 (two) times daily between meals.     fenofibrate 160 MG tablet  TAKE 1 TABLET BY MOUTH DAILY     Fish Oil 1000 MG Caps  Take 1 capsule by mouth daily.     furosemide 20 MG tablet  Commonly known as:  LASIX  Take 1 tablet (20 mg total) by mouth daily.     glucose blood test strip  Commonly known as:  ONE TOUCH ULTRA TEST  TEST EIGHT TIMES DAILY     hydrocerin Crea  Apply 1 application topically 2 (two) times daily.     insulin  aspart 100 UNIT/ML injection  Commonly known as:  novoLOG  Inject 0-20 Units into the skin 3 (three) times daily with meals.     insulin glargine 100 UNIT/ML injection  Commonly known as:  LANTUS  Inject 0.2 mLs (20 Units total) into the skin at bedtime.     magnesium oxide 400 (241.3 Mg) MG tablet  Commonly known as:  MAG-OX  Take 1 tablet (400 mg total) by mouth daily.     methocarbamol 500 MG tablet  Commonly known as:  ROBAXIN  Take 2 tablets (1,000 mg total) by mouth every 8 (eight) hours as needed for muscle spasms.     mometasone 50 MCG/ACT nasal spray  Commonly known as:  NASONEX  Place 2 sprays into the nose daily as needed (allergies).      ondansetron 4 MG disintegrating tablet  Commonly known as:  ZOFRAN ODT  Take 1 tablet (4 mg total) by mouth every 8 (eight) hours as needed for nausea.     oxyCODONE 5 MG immediate release tablet  Commonly known as:  Oxy IR/ROXICODONE  Take 1 tablet (5 mg total) by mouth every 4 (four) hours as needed for moderate pain, severe pain or breakthrough pain.     pantoprazole 40 MG tablet  Commonly known as:  PROTONIX  Take 1 tablet (40 mg total) by mouth daily.     potassium chloride SA 20 MEQ tablet  Commonly known as:  K-DUR,KLOR-CON  Take 1 tablet (20 mEq total) by mouth daily.     senna-docusate 8.6-50 MG tablet  Commonly known as:  Senokot-S  Take 1 tablet by mouth 2 (two) times daily.     TYLENOL EX ST ARTHRITIS PAIN 500 MG tablet  Generic drug:  acetaminophen  Take 500 mg by mouth every 6 (six) hours as needed for mild pain or moderate pain.        No orders of the defined types were placed in this encounter.    Immunization History  Administered Date(s) Administered  . Influenza,inj,Quad PF,36+ Mos 09/18/2013, 09/27/2014, 09/20/2015  . Pneumococcal Conjugate-13 09/27/2014    Social History  Substance Use Topics  . Smoking status: Former Games developer  . Smokeless tobacco: Not on file  . Alcohol Use: No    Filed Vitals:   02/14/16 1212  BP: 133/65  Pulse: 60  Temp: 97.2 F (36.2 C)  Resp: 20    Physical Exam  GENERAL APPEARANCE: Alert, conversant. No acute distress.  HEENT: Unremarkable. RESPIRATORY: Breathing is even, unlabored. Lung sounds are clear   CARDIOVASCULAR: Heart RRR no murmurs, rubs or gallops. No peripheral edema.  GASTROINTESTINAL: Abdomen is soft, non-tender, not distended w/ normal bowel sounds.  NEUROLOGIC: Cranial nerves 2-12 grossly intact. Moves all extremities  Patient Active Problem List   Diagnosis Date Noted  . Hypokalemia 02/03/2016  . Anxiety state 02/03/2016  . Edema 02/03/2016  . Cellulitis 02/03/2016  . Acute respiratory  failure with hypoxia (HCC) 01/31/2016  . Pressure ulcer 01/27/2016  . Hypomagnesemia 01/19/2016  . Small bowel obstruction (HCC) 01/17/2016  . Acute renal failure superimposed on stage 4 chronic kidney disease (HCC) 01/17/2016  . Anemia of chronic disease 01/17/2016  . Anemia associated with acute blood loss 01/17/2016  . Controlled type 2 diabetes mellitus with diabetic nephropathy, with long-term current use of insulin (HCC) 01/17/2016  . Morbid obesity due to excess calories (HCC) 01/17/2016  . Yeast UTI 01/17/2016  . Incarcerated hernia 01/17/2016  . Dyslipidemia associated with type 2 diabetes mellitus (HCC)  01/17/2016  . Lower urinary tract infection 09/25/2014  . Essential hypertension, benign 03/07/2014  . Hyperlipidemia 09/12/2013    CBC    Component Value Date/Time   WBC 4.2 01/29/2016 0553   RBC 3.01* 01/29/2016 0553   HGB 8.4* 01/29/2016 0553   HCT 27.6* 01/29/2016 0553   PLT 214 01/29/2016 0553   MCV 91.7 01/29/2016 0553   LYMPHSABS 0.6* 01/15/2016 0142   MONOABS 0.3 01/15/2016 0142   EOSABS 0.1 01/15/2016 0142   BASOSABS 0.0 01/15/2016 0142    CMP     Component Value Date/Time   NA 139 01/30/2016 0520   K 3.7 01/30/2016 0520   CL 100* 01/30/2016 0520   CO2 29 01/30/2016 0520   GLUCOSE 214* 01/30/2016 0520   BUN 12 01/30/2016 0520   CREATININE 0.88 01/30/2016 0520   CREATININE 1.27* 03/07/2014 0945   CALCIUM 9.0 01/30/2016 0520   PROT 6.5 01/15/2016 0142   ALBUMIN 2.7* 01/15/2016 0142   AST 16 01/15/2016 0142   ALT 13* 01/15/2016 0142   ALKPHOS 61 01/15/2016 0142   BILITOT 1.1 01/15/2016 0142   GFRNONAA >60 01/30/2016 0520   GFRAA >60 01/30/2016 0520    Assessment and Plan  Pt is d/c to home with HH/OT/PT/Nursing, .medications have been reconciled and Rx's written.   Time spent > 30 minutes;> 50% of time with patient was spent reviewing records, labs, tests and studies, counseling and developing plan of care  Margit Hanks, MD

## 2016-03-10 ENCOUNTER — Other Ambulatory Visit: Payer: Self-pay | Admitting: Internal Medicine

## 2016-03-10 NOTE — Telephone Encounter (Signed)
Refill for Novolog Flexpen sent to pharmacy on file.

## 2016-03-11 ENCOUNTER — Other Ambulatory Visit: Payer: Self-pay | Admitting: Internal Medicine

## 2016-03-14 ENCOUNTER — Encounter (HOSPITAL_COMMUNITY): Payer: Self-pay | Admitting: *Deleted

## 2016-03-14 ENCOUNTER — Emergency Department (HOSPITAL_COMMUNITY): Payer: Medicare Other

## 2016-03-14 ENCOUNTER — Emergency Department (HOSPITAL_COMMUNITY)
Admission: EM | Admit: 2016-03-14 | Discharge: 2016-03-15 | Disposition: A | Discharge status: Home / Self Care | Payer: Medicare Other | Attending: Emergency Medicine | Admitting: Emergency Medicine

## 2016-03-14 DIAGNOSIS — Z79899 Other long term (current) drug therapy: Secondary | ICD-10-CM | POA: Diagnosis not present

## 2016-03-14 DIAGNOSIS — F419 Anxiety disorder, unspecified: Secondary | ICD-10-CM | POA: Diagnosis not present

## 2016-03-14 DIAGNOSIS — E785 Hyperlipidemia, unspecified: Secondary | ICD-10-CM | POA: Diagnosis not present

## 2016-03-14 DIAGNOSIS — L03116 Cellulitis of left lower limb: Secondary | ICD-10-CM | POA: Diagnosis not present

## 2016-03-14 DIAGNOSIS — R2242 Localized swelling, mass and lump, left lower limb: Secondary | ICD-10-CM | POA: Diagnosis present

## 2016-03-14 DIAGNOSIS — E119 Type 2 diabetes mellitus without complications: Secondary | ICD-10-CM | POA: Diagnosis not present

## 2016-03-14 DIAGNOSIS — Z87891 Personal history of nicotine dependence: Secondary | ICD-10-CM | POA: Insufficient documentation

## 2016-03-14 DIAGNOSIS — Z794 Long term (current) use of insulin: Secondary | ICD-10-CM | POA: Insufficient documentation

## 2016-03-14 DIAGNOSIS — I10 Essential (primary) hypertension: Secondary | ICD-10-CM | POA: Diagnosis not present

## 2016-03-14 LAB — CBC WITH DIFFERENTIAL/PLATELET
BASOS ABS: 0 10*3/uL (ref 0.0–0.1)
BASOS PCT: 1 %
EOS PCT: 5 %
Eosinophils Absolute: 0.3 10*3/uL (ref 0.0–0.7)
HCT: 31.4 % — ABNORMAL LOW (ref 36.0–46.0)
Hemoglobin: 9.8 g/dL — ABNORMAL LOW (ref 12.0–15.0)
LYMPHS PCT: 13 %
Lymphs Abs: 0.6 10*3/uL — ABNORMAL LOW (ref 0.7–4.0)
MCH: 28 pg (ref 26.0–34.0)
MCHC: 31.2 g/dL (ref 30.0–36.0)
MCV: 89.7 fL (ref 78.0–100.0)
MONO ABS: 0.4 10*3/uL (ref 0.1–1.0)
Monocytes Relative: 8 %
Neutro Abs: 3.7 10*3/uL (ref 1.7–7.7)
Neutrophils Relative %: 73 %
PLATELETS: 320 10*3/uL (ref 150–400)
RBC: 3.5 MIL/uL — ABNORMAL LOW (ref 3.87–5.11)
RDW: 16.9 % — AB (ref 11.5–15.5)
WBC: 5 10*3/uL (ref 4.0–10.5)

## 2016-03-14 NOTE — ED Notes (Signed)
Pt arrives to the ER via EMS stating that she wants her wound checked; pt had gastric surgery and has a wound vac; pt was seen by home health yesterday; pt denies pain or any new complaints with incision; pt states "I just wanted to get it checked out"

## 2016-03-14 NOTE — ED Provider Notes (Signed)
CSN: [id number]     Arrival date & time 03/14/16  [id number] History   By signing my name below, I, Arlan Organshley Leger, attest that this documentation has been prepared under the direction and in the presence of Earley FavorGail Kyreese Chio, FNP.  Electronically Signed: Arlan OrganAshley Leger, ED Scribe. 03/14/2016. 10:33 PM.   Chief Complaint  Patient presents with  . Wound Check   The history is provided by the patient. No language interpreter was used.    HPI Comments: Valerie [name] brought in by EMS is a 70 y.o. female with a PMHx of HTN, hyperlipidemia, and DM who presents to the Emergency Department here for a wound check this evening. Pt states she was resting taking a nap this evening when she suddenily woke up and felt as though she was having an anxiety attack which resolved while in EMS truck. Pt states this is new for her as intermittent anxiety attacks have been ongoing since having a laparotomy on 01/15/2016. Pt also reports ongoing, intermittent sleep disturbances since late January of this year. She states she typically sleeps only 1.5 hours at night time. OTC sleep aids attempted without any improvement.  Pt also reports intermittent, ongoing shortness of breath; exacerbated with exertion that is chronic in nature for her. No recent fever, chills, nausea, or vomiting.  Pt also reports constant, ongoing swelling to the L lower extremity with associated redness x 2-3 days. However, pt states this has been ongoing for 4 months which has improved and returned multiple times. Pt was recently prescribed Doxycycline from her rehab facility but admits she stopped taking medication as "it wasn't on my papers after hospital discharge". She denies any pain to the leg at this time.  PCP: Garth SchlatterAMEEN, WILLIAM OTIS, MD    Past Medical History  Diagnosis Date  . Hypertension   . Hyperlipidemia   . Diabetes mellitus without complication [facility](HCC)    Past Surgical History  Procedure Laterality Date  . Pancreas surgery  1985  . Cataract  extraction w/ intraocular lens  implant, bilateral    . Laparotomy N/A 01/15/2016    Procedure: EXPLORATORY LAPAROTOMY, LYSIS OF ADHESIONS, EXTENSIVE DEBRIDEMENT OF SKIN AND SUBCUTANEOUS TISSUE AND MUSCLE, REPAIR OF INCARCERATED INCISION HERNIA WITH BIOLOGIC MESH, APPLICATION OF NEGATIVE PRESSURE DRESSING;  Surgeon: Claud KelpHaywood Ingram, MD;  Location: [facility];  Service: General;  Laterality: N/A;   Family History  Problem Relation Age of Onset  . Cancer Father     Prostate cancer  . Cancer Mother     Gynecologic   Social History  Substance Use Topics  . Smoking status: Former Games developermoker  . Smokeless tobacco: None  . Alcohol Use: No   OB History    No data available     Review of Systems  Constitutional: Negative for fever and chills.  Cardiovascular: Positive for leg swelling.  Gastrointestinal: Negative for nausea, vomiting and abdominal pain.  Psychiatric/Behavioral: The patient is nervous/anxious.   All other systems reviewed and are negative.     Allergies  Review of patient's allergies indicates no known allergies.  Home Medications   Prior to Admission medications   Medication Sig Start Date End Date Taking? Authorizing Provider  acetaminophen (TYLENOL EX ST ARTHRITIS PAIN) 500 MG tablet Take 500 mg by mouth every 6 (six) hours as needed for mild pain or moderate pain.    Yes Historical Provider, MD  ALPRAZolam (XANAX) 0.25 MG tablet Take 0.25 mg by mouth 2 (two) times daily.   Yes Historical Provider, MD  amLODipine (  NORVASC) 10 MG tablet TAKE 1 TABLET BY MOUTH EVERY DAY 10/21/15  Yes Reather Littler, MD  atorvastatin (LIPITOR) 10 MG tablet TAKE 1 TABLET BY MOUTH DAILY 09/12/15  Yes Reather Littler, MD  CRANBERRY PO Take 1 tablet by mouth 2 (two) times daily.   Yes Historical Provider, MD  fenofibrate 160 MG tablet TAKE 1 TABLET BY MOUTH DAILY 10/21/15  Yes Reather Littler, MD  furosemide (LASIX) 20 MG tablet Take 1 tablet (20 mg total) by mouth daily. 01/30/16  Yes Alison Murray, MD   hydrocerin (EUCERIN) CREA Apply 1 application topically 2 (two) times daily. 01/13/16  Yes Alison Murray, MD  insulin regular (NOVOLIN R,HUMULIN R) 100 units/mL injection Inject 1-8 Units into the skin 3 (three) times daily before meals. Per sliding scale   Yes Historical Provider, MD  magnesium oxide (MAG-OX) 400 (241.3 Mg) MG tablet Take 1 tablet (400 mg total) by mouth daily. 01/30/16  Yes Alison Murray, MD  methocarbamol (ROBAXIN) 500 MG tablet Take 2 tablets (1,000 mg total) by mouth every 8 (eight) hours as needed for muscle spasms. 01/30/16  Yes Alison Murray, MD  mometasone (NASONEX) 50 MCG/ACT nasal spray Place 2 sprays into the nose daily as needed (allergies).   Yes Historical Provider, MD  Omega-3 Fatty Acids (FISH OIL) 1000 MG CAPS Take 1 capsule by mouth daily.    Yes Historical Provider, MD  omeprazole (PRILOSEC) 40 MG capsule Take 40 mg by mouth daily.   Yes Historical Provider, MD  ondansetron (ZOFRAN ODT) 4 MG disintegrating tablet Take 1 tablet (4 mg total) by mouth every 8 (eight) hours as needed for nausea. 01/05/16  Yes Rolland Porter, MD  oxyCODONE (OXY IR/ROXICODONE) 5 MG immediate release tablet Take 1 tablet (5 mg total) by mouth every 4 (four) hours as needed for moderate pain, severe pain or breakthrough pain. 01/30/16  Yes Alison Murray, MD  potassium chloride SA (K-DUR,KLOR-CON) 20 MEQ tablet Take 1 tablet (20 mEq total) by mouth daily. 01/30/16  Yes Alison Murray, MD  senna-docusate (SENOKOT-S) 8.6-50 MG tablet Take 1 tablet by mouth 2 (two) times daily. 01/30/16  Yes Alison Murray, MD  traMADol (ULTRAM) 50 MG tablet Take 50 mg by mouth every 6 (six) hours as needed for moderate pain.   Yes Historical Provider, MD  doxycycline (VIBRA-TABS) 100 MG tablet Take 1 tablet (100 mg total) by mouth 2 (two) times daily. 03/15/16   Earley Favor, NP  hydrOXYzine (ATARAX/VISTARIL) 25 MG tablet Take 1 tablet (25 mg total) by mouth at bedtime. 03/15/16   Earley Favor, NP  insulin aspart (NOVOLOG) 100  UNIT/ML injection Inject 0-20 Units into the skin 3 (three) times daily with meals. Patient not taking: Reported on 03/15/2016 01/30/16   Alison Murray, MD  insulin glargine (LANTUS) 100 UNIT/ML injection Inject 0.2 mLs (20 Units total) into the skin at bedtime. Patient taking differently: Inject 38 Units into the skin at bedtime.  01/30/16   Alison Murray, MD   Triage Vitals: BP 145/82 mmHg  Pulse 93  Temp(Src) 95.3 F (35.2 C) (Oral)  Resp 18  SpO2 97%   Physical Exam  Constitutional: She is oriented to person, place, and time. She appears well-developed and well-nourished.  HENT:  Head: Normocephalic.  Eyes: EOM are normal.  Neck: Normal range of motion.  Cardiovascular: Normal rate, regular rhythm and normal heart sounds.   Pulmonary/Chest: Effort normal and breath sounds normal.  No swelling or erythema noted around  incision site   Abdominal: She exhibits no distension.  Musculoskeletal: Normal range of motion. She exhibits edema.  4+ pitting edema to the tibial plateau of the L leg Area of erythema that is approximately 10 x 4 cm in an oval pattern on L anterior shin from ankle joint just past mid shin with significant swelling all the way up to the knee 2+ pitting edema to mid calf on R without any erythema or discoloration   Neurological: She is alert and oriented to person, place, and time.  Psychiatric: She has a normal mood and affect.  Nursing note and vitals reviewed.   ED Course  Procedures (including critical care time)  DIAGNOSTIC STUDIES: Oxygen Saturation is 99% on RA, Normal by my interpretation.    COORDINATION OF CARE: 10:25 PM- Will order CXR, CMP, BMP, and CBC. Discussed treatment plan with pt at bedside and pt agreed to plan.     Labs Review Labs Reviewed  CBC WITH DIFFERENTIAL/PLATELET - Abnormal; Notable for the following:    RBC 3.50 (*)    Hemoglobin 9.8 (*)    HCT 31.4 (*)    RDW 16.9 (*)    Lymphs Abs 0.6 (*)    All other components within  normal limits  COMPREHENSIVE METABOLIC PANEL - Abnormal; Notable for the following:    CO2 20 (*)    BUN 30 (*)    Creatinine, Ser 1.09 (*)    Albumin 3.3 (*)    ALT 9 (*)    GFR calc non Af Amer 50 (*)    GFR calc Af Amer 58 (*)    All other components within normal limits  BRAIN NATRIURETIC PEPTIDE - Abnormal; Notable for the following:    B Natriuretic Peptide 105.0 (*)    All other components within normal limits    Imaging Review Dg Chest 2 View  03/15/2016  CLINICAL DATA:  Panic intact.  Awakened from sleep tonight. EXAM: CHEST  2 VIEW COMPARISON:  01/20/2016 FINDINGS: Mild patchy left base opacity is present, reduced from 01/20/2016. There also is near complete resolution of right upper lobe opacity. There is mild generalized interstitial coarsening. There is no pleural effusion. Hilar and mediastinal contours are unremarkable. IMPRESSION: Improved from 01/20/2016, with residual or recurrent patchy airspace opacity in the left base and with mild residual interstitial coarsening of indeterminate chronicity. No effusions. Electronically Signed   By: Ellery Plunk M.D.   On: 03/15/2016 00:10   I have personally reviewed and evaluated these images and lab results as part of my medical decision-making.   EKG Interpretation None     Will check CBC< CMET,BNP, chest xray most likey recurrent chronic lower extremity cellulitis and anxiety  Per records has been on Xanax ion the past but not currently Reviewed labs, x-ray, BNP all within normal parameters.  Patient states she is taking Xanax but does not feel that is helpful.  I will switch this to Vistaril to see if this helps her sleep better.  She does have several appointments this week with areas positions.  She is to discuss her anxiety and sleepless with them at that time MDM   Final diagnoses:  Anxiety  Cellulitis of left lower extremity    I personally performed the services described in this documentation, which was  scribed in my presence. The recorded information has been reviewed and is accurate.  Earley Favor, NP 03/15/16 0960  Geoffery Lyons, MD 03/15/16 [phone]

## 2016-03-14 NOTE — ED Notes (Signed)
Pt states that she was napping this evening and woke up and felt like she was having an anxiety attack; pt states that she began having anxiety attacks after her most recent surgery; pt states that her anxiety attack had resolved upon EMS arrival; pt states that she is concerned about swelling to her left lower extremity; pt states that she has had cellulitis to that lower extremity; pt also concerned about a "sore" to her lip; pt states that it is swollen and she keeps biting it when she eats; pt also states that she has been unable to sleep more than 2 hrs a night

## 2016-03-15 ENCOUNTER — Other Ambulatory Visit: Payer: Self-pay | Admitting: Internal Medicine

## 2016-03-15 LAB — COMPREHENSIVE METABOLIC PANEL
ALT: 9 U/L — ABNORMAL LOW (ref 14–54)
ANION GAP: 13 (ref 5–15)
AST: 17 U/L (ref 15–41)
Albumin: 3.3 g/dL — ABNORMAL LOW (ref 3.5–5.0)
Alkaline Phosphatase: 51 U/L (ref 38–126)
BUN: 30 mg/dL — AB (ref 6–20)
CHLORIDE: 109 mmol/L (ref 101–111)
CO2: 20 mmol/L — ABNORMAL LOW (ref 22–32)
Calcium: 9.1 mg/dL (ref 8.9–10.3)
Creatinine, Ser: 1.09 mg/dL — ABNORMAL HIGH (ref 0.44–1.00)
GFR calc Af Amer: 58 mL/min — ABNORMAL LOW (ref >60)
GFR, EST NON AFRICAN AMERICAN: 50 mL/min — AB (ref >60)
Glucose, Bld: 99 mg/dL (ref 65–99)
POTASSIUM: 5.1 mmol/L (ref 3.5–5.1)
Sodium: 142 mmol/L (ref 135–145)
Total Bilirubin: 0.5 mg/dL (ref 0.3–1.2)
Total Protein: 6.8 g/dL (ref 6.5–8.1)

## 2016-03-15 LAB — BRAIN NATRIURETIC PEPTIDE: B NATRIURETIC PEPTIDE 5: 105 pg/mL — AB (ref 0.0–100.0)

## 2016-03-15 MED ORDER — DOXYCYCLINE HYCLATE 100 MG PO TABS
100.0000 mg | ORAL_TABLET | Freq: Once | ORAL | Status: AC
  Administered 2016-03-15: 100 mg via ORAL
  Filled 2016-03-15: qty 1

## 2016-03-15 MED ORDER — HYDROXYZINE HCL 25 MG PO TABS
25.0000 mg | ORAL_TABLET | Freq: Once | ORAL | Status: AC
  Administered 2016-03-15: 25 mg via ORAL
  Filled 2016-03-15: qty 1

## 2016-03-15 MED ORDER — DOXYCYCLINE HYCLATE 100 MG PO TABS: 100.0000 mg | ORAL_TABLET | Freq: Two times a day (BID) | ORAL | Status: DC

## 2016-03-15 MED ORDER — HYDROXYZINE HCL 25 MG PO TABS: 25.0000 mg | ORAL_TABLET | Freq: Every day | ORAL | Status: DC

## 2016-03-15 NOTE — Discharge Instructions (Signed)
You have been give a prescription for Atarax to help with your insomnia, please take only as directed, follow up with your PCP You have also been restarted on an antibiotic for your recurrent cellulitis- again see your PCP who can monitor healing   Panic Attacks Panic attacks are sudden, short-livedsurges of severe anxiety, fear, or discomfort. They may occur for no reason when you are relaxed, when you are anxious, or when you are sleeping. Panic attacks may occur for a number of reasons:   Healthy people occasionally have panic attacks in extreme, life-threatening situations, such as war or natural disasters. Normal anxiety is a protective mechanism of the body that helps [place] react to danger (fight or flight response).  Panic attacks are often seen with anxiety disorders, such as panic disorder, social anxiety disorder, generalized anxiety disorder, and phobias. Anxiety disorders cause excessive or uncontrollable anxiety. They may interfere with your relationships or other life activities.  Panic attacks are sometimes seen with other mental illnesses, such as depression and posttraumatic stress disorder.  Certain medical conditions, prescription medicines, and drugs of abuse can cause panic attacks. SYMPTOMS  Panic attacks start suddenly, peak within 20 minutes, and are accompanied by four or more of the following symptoms:  Pounding heart or fast heart rate (palpitations).  Sweating.  Trembling or shaking.  Shortness of breath or feeling smothered.  Feeling choked.  Chest pain or discomfort.  Nausea or strange feeling in your stomach.  Dizziness, light-headedness, or feeling like you will faint.  Chills or hot flushes.  Numbness or tingling in your lips or hands and feet.  Feeling that things are not real or feeling that you are not yourself.  Fear of losing control or going crazy.  Fear of dying. Some of these symptoms can mimic serious medical conditions. For example,  you may think you are having a heart attack. Although panic attacks can be very scary, they are not life threatening. DIAGNOSIS  Panic attacks are diagnosed through an assessment by your health care provider. Your health care provider will ask questions about your symptoms, such as where and when they occurred. Your health care provider will also ask about your medical history and use of alcohol and drugs, including prescription medicines. Your health care provider may order blood tests or other studies to rule out a serious medical condition. Your health care provider may refer you to a mental health professional for further evaluation. TREATMENT   Most healthy people who have one or two panic attacks in an extreme, life-threatening situation will not require treatment.  The treatment for panic attacks associated with anxiety disorders or other mental illness typically involves counseling with a mental health professional, medicine, or a combination of both. Your health care provider will help determine what treatment is best for you.  Panic attacks due to physical illness usually go away with treatment of the illness. If prescription medicine is causing panic attacks, talk with your health care provider about stopping the medicine, decreasing the dose, or substituting another medicine.  Panic attacks due to alcohol or drug abuse go away with abstinence. Some adults need professional help in order to stop drinking or using drugs. HOME CARE INSTRUCTIONS   Take all medicines as directed by your health care provider.   Schedule and attend follow-up visits as directed by your health care provider. It is important to keep all your appointments. SEEK MEDICAL CARE IF:  You are not able to take your medicines as prescribed.  Your  symptoms do not improve or get worse. SEEK IMMEDIATE MEDICAL CARE IF:   You experience panic attack symptoms that are different than your usual symptoms.  You have  serious thoughts about hurting yourself or others.  You are taking medicine for panic attacks and have a serious side effect. MAKE SURE YOU:  Understand these instructions.  Will watch your condition.  Will get help right away if you are not doing well or get worse.   This information is not intended to replace advice given to you by your health care provider. Make sure you discuss any questions you have with your health care provider.   Document Released: 12/14/2005 Document Revised: 12/19/2013 Document Reviewed: 07/28/2013 Elsevier Interactive Patient Education 2016 Elsevier Inc.  Cellulitis Cellulitis is an infection of the skin and the tissue under the skin. The infected area is usually red and tender. This happens most often in the arms and lower legs. HOME CARE   Take your antibiotic medicine as told. Finish the medicine even if you start to feel better.  Keep the infected arm or leg raised (elevated).  Put a warm cloth on the area up to 4 times per day.  Only take medicines as told by your doctor.  Keep all doctor visits as told. GET HELP IF:  You see red streaks on the skin coming from the infected area.  Your red area gets bigger or turns a dark color.  Your bone or joint under the infected area is painful after the skin heals.  Your infection comes back in the same area or different area.  You have a puffy (swollen) bump in the infected area.  You have new symptoms.  You have a fever. GET HELP RIGHT AWAY IF:   You feel very sleepy.  You throw up (vomit) or have watery poop (diarrhea).  You feel sick and have muscle aches and pains.   This information is not intended to replace advice given to you by your health care provider. Make sure you discuss any questions you have with your health care provider.   Document Released: 06/01/2008 Document Revised: 09/04/2015 Document Reviewed: 02/29/2012 Elsevier Interactive Patient Education [employer]2016 Elsevier  Inc.

## 2016-03-18 ENCOUNTER — Other Ambulatory Visit: Payer: Self-pay | Admitting: Internal Medicine

## 2016-03-20 ENCOUNTER — Other Ambulatory Visit: Payer: Self-pay | Admitting: Urology

## 2016-04-01 ENCOUNTER — Encounter (HOSPITAL_BASED_OUTPATIENT_CLINIC_OR_DEPARTMENT_OTHER): Payer: Self-pay | Admitting: *Deleted

## 2016-04-01 NOTE — Progress Notes (Signed)
To [facility] at 0915-Istat on arrival,Ekg with chart -will take norvasc ,prilosec,tylenol,robaxin with water in am-instructed to take 1/2 dose of sliding scale insulin based on CBG -small amt clear juice-no orange/pulp if blood sugar low early am-Npo after 3 am.call ,arrive early if questions,problems with blood sugar levels.Also bring any supplies may need with wound vac.

## 2016-04-01 NOTE — H&P (Signed)
Chief Complaint history of frequent urination   Active Problems Problems  1. 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 Chronic cystitis (N30.20) 3. Feeling of incomplete bladder emptying (R39.14) 4. Gross hematuria (R31.0) 5. Hydronephrosis, right (N13.30)  History of Present Illness Valerie [name] is a 70 yo WF who is sent in consultation by Dr. Manus GunningEhinger for recurrent UTI's. She has had recurring infections for about 3 years that was associated with frequency and urgency but also gross blood on occasion. She had no pain with the infections. She had no fever. She is currently symptom free following a hospitalization for SBO that required surgical correction and had antibiotics during that time and remains on an doxycyline for cellulitis. She had yeast on her culture from 01/16/16. She has no incontinence at this time.  She has had several CT scans over the last 3 years that showed mild right hydronephrosis with the most recent showing some mucosal thickening. There is no significant dilation of the ureter. No stones were seen and she has no history of one. She has had no GU surgery. She has no pneumaturia.   Past Medical History Problems  1. History of Anxiety (F41.9) 2. History of arthritis (Z87.39) 3. History of diabetes mellitus (Z86.39) 4. History of esophageal reflux (Z87.19) 5. History of hypercholesterolemia (Z86.39) 6. History of hypertension (Z86.79) 7. History of pancreatitis (Z87.19) 8. History of small bowel obstruction (Z87.19)  Surgical History Problems  1. History of Pancreatic Surgery 2. History of Small Bowel Resection 3. History of Umbilical Hernia Repair  Current Meds 1. Allegra CAPS;  Therapy: (Recorded:22Mar2017) to Recorded 2. ALPRAZolam 0.25 MG Oral Tablet;  Therapy: (Recorded:22Mar2017) to Recorded 3. AmLODIPine Besylate 10 MG Oral Tablet;  Therapy: (Recorded:22Mar2017) to Recorded 4. Cranberry TABS;  Therapy: (Recorded:22Mar2017) to Recorded 5. Fenofibrate CAPS;  Therapy: (Recorded:22Mar2017) to Recorded 6. Fish Oil CAPS;  Therapy: (Recorded:22Mar2017) to Recorded 7. Flonase 50 MCG/ACT SUSP;  Therapy: (Recorded:22Mar2017) to Recorded 8. Furosemide 20 MG Oral Tablet;  Therapy: (Recorded:22Mar2017) to Recorded 9. HumuLIN R 100 UNIT/ML Injection Solution;  Therapy: (Recorded:22Mar2017) to Recorded 10. Hydrocerin External Lotion;   Therapy: (Recorded:22Mar2017) to Recorded 11. HydroCHLOROthiazide 12.5 MG Oral Tablet;   Therapy: (Recorded:22Mar2017) to Recorded 12. Kayexalate POWD;   Therapy: (Recorded:22Mar2017) to Recorded 13. Lipitor 10 MG Oral Tablet;   Therapy: (Recorded:22Mar2017) to Recorded 14. Mag-Ox 400 MG TABS;   Therapy: (Recorded:22Mar2017) to Recorded 15. Methocarbamol 500 MG Oral Tablet;   Therapy: (Recorded:22Mar2017) to Recorded 16. Mucinex 600 MG TBCR;   Therapy: (Recorded:22Mar2017) to Recorded 17. Myrbetriq 50 MG Oral Tablet Extended Release 24 Hour;   Therapy: (Recorded:22Mar2017) to Recorded 18. Ondansetron 4 MG Oral Tablet Dispersible;   Therapy: (Recorded:22Mar2017) to Recorded 19. Pantoprazole Sodium 40 MG Oral Tablet Delayed Release;   Therapy: (Recorded:22Mar2017) to Recorded 20. Potassium Chloride Crys ER 20 MEQ Oral Tablet Extended Release;   Therapy: (Recorded:22Mar2017) to Recorded 21. Senna-Docusate Sodium 8.6-50 MG Oral Tablet;   Therapy: (Recorded:22Mar2017) to Recorded 22. TiZANidine HCl - 2 MG Oral Tablet;   Therapy: (Recorded:22Mar2017) to Recorded 23. TraMADol HCl - 50 MG Oral Tablet;   Therapy: (Recorded:22Mar2017) to Recorded 24. Tylenol TABS;   Therapy: (Recorded:22Mar2017) to Recorded  Allergies Medication  1. No Known Drug Allergies  Family History Problems  1. Family history of Death of family member : Mother, Father   mother age 72 of  CHF      Dad  age 72 of  kidney failure 2. Family history of prostate cancer (Z80.42) : Father  Social History Problems  Denied: History of Alcohol  use   Caffeine use (F15.90)   1 a day   Former smoker [phone])   Married   Number of children   2 sons (twins)     1 daughter   Retired   History of Tobacco use (Z72.0)   2 ppd x 20 years  Review of Systems Genitourinary, constitutional, skin, eye, otolaryngeal, hematologic/lymphatic, cardiovascular, pulmonary, endocrine, musculoskeletal, gastrointestinal, neurological and psychiatric system(s) were reviewed and pertinent findings if present are noted and are otherwise negative.  Genitourinary: urinary frequency and nocturia (She has occasional vaginal bleeding. ).  ENT: sinus problems.  Cardiovascular: leg swelling.  Respiratory: shortness of breath.  Musculoskeletal: back pain.  Psychiatric: anxiety.    Vitals Vital Signs [Data Includes: Last 1 Day]  Recorded: 22Mar2017 09:29AM  Blood Pressure: 107 / 61 Heart Rate: 78 Recorded: 22Mar2017 09:26AM  Height: 5 ft 4 in Weight: 249 lb  BMI Calculated: 42.74 BSA Calculated: 2.15 Temperature: 96.9 F  Physical Exam Constitutional: Well nourished and well developed . No acute distress.  ENT:. The ears and nose are normal in appearance.  Neck: The appearance of the neck is normal and no neck mass is present.  Pulmonary: No respiratory distress and normal respiratory rhythm and effort.  Cardiovascular: Heart rate and rhythm are normal . No peripheral edema.  Abdomen: Incision site(s) healing well (She has a large transverse lower abdominal incision with a small area in the midline covered with a vac appliance. ). The abdomen is obese (morbidly). The abdomen is soft and nontender (with the exception of some induration from the mesh in the area of her incision. ). No masses are palpated. No CVA tenderness. No hernias are palpable. No hepatosplenomegaly noted.  Genitourinary:. She has a large lobular panniculus covering the genitalia. I was unable to do a bimanual exam.  Chaperone Present: Erica.  Examination of the external  genitalia shows vulvar atrophy (marked with stenosis). The urethra is normal in appearance. Vaginal exam demonstrates atrophy and the vaginal epithelium to be poorly estrogenized. Postvoid residual urine is 200 mL. The anus is normal on inspection. The perineum is normal on inspection.  Lymphatics: The femoral and inguinal nodes are not enlarged or tender.  Skin: Normal skin turgor, no visible rash and no visible skin lesions.  Neuro/Psych:. Mood and affect are appropriate. Normal sensation of the perineum/perianal region (S3,4,5).    Results/Data Urine [Data Includes: Last 1 Day]   22Mar2017  COLOR YELLOW  YELLOW   APPEARANCE CLOUDY  CLOUDY   SPECIFIC GRAVITY 1.015  1.010   pH 5.5  5.0   GLUCOSE NEGATIVE  NEGATIVE   BILIRUBIN NEGATIVE  NEGATIVE   KETONE NEGATIVE  NEGATIVE   BLOOD 3+  3+   PROTEIN 1+  NEGATIVE   NITRITE NEGATIVE  NEGATIVE   LEUKOCYTE ESTERASE 3+  3+   SQUAMOUS EPITHELIAL/HPF 0-5 HPF 0-5 HPF  WBC >60 WBC/HPF >60 WBC/HPF  RBC 20-40 RBC/HPF 10-20 RBC/HPF  BACTERIA FEW HPF  HPF  CRYSTALS See Below HPF NONE SEEN HPF  CASTS NONE SEEN LPF NONE SEEN LPF  Yeast NONE SEEN HPF NONE SEEN HPF   Old records or history reviewed: I have reviewed her hospital notes and records from Dr. Manus Gunning.  The following images/tracing/specimen were independently visualized:  I have reviewed 3 CT films dating back to 2014.  The following clinical lab reports were reviewed:  I have reviewed her UA's today along with her UA's and cultures from her hospitalization and her hospital  labs. She has minimal renal insufficiency.    Procedure Sterile cath was performed with a 14 fr straight cath with a PVR and cloudy urine.     Assessment Assessed  1. Gross hematuria (R31.0) 2. Chronic cystitis (N30.20) 3. Hydronephrosis, right (N13.30) 4. 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 Feeling of incomplete bladder emptying (R39.14)  She has chronic cystitis with chronic right hydronephrosis with mucosal  thickening and has had hematuria.  She has an elevated PVR.   She had yeast on a culture in the hospital.  She is currently symptom free post antibiotics and remains on doxycycline but her UA remains markedly abnormal.  She has atrophic vaginitis with some vaginal bleeding.   Plan Chronic cystitis  1. Get Outside Records Office  Follow-up  Status: Hold For - Records,Records Release   Requested for: 22Mar2017 2. URINE CULTURE; Status:In Progress - Specimen/Data Collected;   Done: 22Mar2017 Gross hematuria  3. Cath for PVR, NO UA; Status:Complete;   Done: 22Mar2017 4. Pelvic Exam; Status:Complete;   Done: 22Mar2017 Health Maintenance  5. UA With REFLEX; [Do Not Release]; Status:Resulted - Requires Verification;   Done:  22Mar2017 08:44AM 6. UA With REFLEX; [Do Not Release]; Status:Resulted - Requires Verification;   Done:  22Mar2017 10:07AM  Urine culture today from cath specimen.  I am going to get her set up for cystoscopy with right retrograde pyelography along with possible ureteroscopy and stenting.  The risks were reviewed in detail.  I will get records from her prior urologist in [place], Dr. Sherryl Barters.

## 2016-04-02 ENCOUNTER — Ambulatory Visit (HOSPITAL_BASED_OUTPATIENT_CLINIC_OR_DEPARTMENT_OTHER): Payer: Medicare Other | Admitting: Anesthesiology

## 2016-04-02 ENCOUNTER — Encounter (HOSPITAL_BASED_OUTPATIENT_CLINIC_OR_DEPARTMENT_OTHER): Payer: Self-pay | Admitting: *Deleted

## 2016-04-02 ENCOUNTER — Encounter (HOSPITAL_BASED_OUTPATIENT_CLINIC_OR_DEPARTMENT_OTHER)
Admission: RE | Disposition: A | Discharge status: Home / Self Care | Payer: Self-pay | Source: Ambulatory Visit | Attending: Urology

## 2016-04-02 ENCOUNTER — Ambulatory Visit (HOSPITAL_BASED_OUTPATIENT_CLINIC_OR_DEPARTMENT_OTHER)
Admission: RE | Admit: 2016-04-02 | Discharge: 2016-04-02 | Disposition: A | Discharge status: Home / Self Care | Payer: Medicare Other | Source: Ambulatory Visit | Attending: Urology | Admitting: Urology

## 2016-04-02 DIAGNOSIS — Z6841 Body Mass Index (BMI) 40.0 and over, adult: Secondary | ICD-10-CM | POA: Insufficient documentation

## 2016-04-02 DIAGNOSIS — Z87891 Personal history of nicotine dependence: Secondary | ICD-10-CM | POA: Insufficient documentation

## 2016-04-02 DIAGNOSIS — E119 Type 2 diabetes mellitus without complications: Secondary | ICD-10-CM | POA: Diagnosis not present

## 2016-04-02 DIAGNOSIS — N3020 Other chronic cystitis without hematuria: Secondary | ICD-10-CM | POA: Insufficient documentation

## 2016-04-02 DIAGNOSIS — K219 Gastro-esophageal reflux disease without esophagitis: Secondary | ICD-10-CM | POA: Diagnosis not present

## 2016-04-02 DIAGNOSIS — N1330 Unspecified hydronephrosis: Secondary | ICD-10-CM | POA: Insufficient documentation

## 2016-04-02 DIAGNOSIS — I10 Essential (primary) hypertension: Secondary | ICD-10-CM | POA: Diagnosis not present

## 2016-04-02 DIAGNOSIS — Z794 Long term (current) use of insulin: Secondary | ICD-10-CM | POA: Insufficient documentation

## 2016-04-02 DIAGNOSIS — N952 Postmenopausal atrophic vaginitis: Secondary | ICD-10-CM | POA: Insufficient documentation

## 2016-04-02 HISTORY — DX: Unspecified hydronephrosis: N13.30

## 2016-04-02 HISTORY — DX: Anxiety disorder, unspecified: F41.9

## 2016-04-02 HISTORY — PX: CYSTOSCOPY WITH RETROGRADE PYELOGRAM, URETEROSCOPY AND STENT PLACEMENT: SHX5789

## 2016-04-02 HISTORY — DX: Other injury of unspecified body region, subsequent encounter: T14.8XXD

## 2016-04-02 LAB — GLUCOSE, CAPILLARY: GLUCOSE-CAPILLARY: 214 mg/dL — AB (ref 65–99)

## 2016-04-02 LAB — POCT I-STAT 4, (NA,K, GLUC, HGB,HCT)
Glucose, Bld: 216 mg/dL — ABNORMAL HIGH (ref 65–99)
HEMATOCRIT: 62 % — AB (ref 36.0–46.0)
Hemoglobin: 21.1 g/dL (ref 12.0–15.0)
Potassium: 4.5 mmol/L (ref 3.5–5.1)
SODIUM: 140 mmol/L (ref 135–145)

## 2016-04-02 SURGERY — CYSTOURETEROSCOPY, WITH RETROGRADE PYELOGRAM AND STENT INSERTION
Anesthesia: General | Site: Ureter | Laterality: Bilateral

## 2016-04-02 MED ORDER — PROPOFOL 10 MG/ML IV BOLUS
INTRAVENOUS | Status: DC | PRN
  Administered 2016-04-02: 170 mg via INTRAVENOUS

## 2016-04-02 MED ORDER — GENTAMICIN SULFATE 40 MG/ML IJ SOLN
5.0000 mg/kg | Freq: Once | INTRAVENOUS | Status: DC
  Filled 2016-04-02: qty 10

## 2016-04-02 MED ORDER — LACTATED RINGERS IV SOLN
INTRAVENOUS | Status: DC
  Administered 2016-04-02: 10:00:00 via INTRAVENOUS
  Filled 2016-04-02: qty 1000

## 2016-04-02 MED ORDER — OXYCODONE HCL 5 MG/5ML PO SOLN
5.0000 mg | Freq: Once | ORAL | Status: DC | PRN
  Filled 2016-04-02: qty 5

## 2016-04-02 MED ORDER — PHENYLEPHRINE HCL 10 MG/ML IJ SOLN
10.0000 mg | INTRAVENOUS | Status: DC | PRN
  Administered 2016-04-02: 30 ug/min via INTRAVENOUS

## 2016-04-02 MED ORDER — FENTANYL CITRATE (PF) 100 MCG/2ML IJ SOLN
INTRAMUSCULAR | Status: DC | PRN
  Administered 2016-04-02 (×2): 25 ug via INTRAVENOUS
  Administered 2016-04-02: 50 ug via INTRAVENOUS

## 2016-04-02 MED ORDER — SODIUM CHLORIDE 0.9% FLUSH
3.0000 mL | Freq: Two times a day (BID) | INTRAVENOUS | Status: DC
  Filled 2016-04-02: qty 3

## 2016-04-02 MED ORDER — ONDANSETRON HCL 4 MG/2ML IJ SOLN
INTRAMUSCULAR | Status: DC | PRN
  Administered 2016-04-02: 4 mg via INTRAVENOUS

## 2016-04-02 MED ORDER — FENTANYL CITRATE (PF) 100 MCG/2ML IJ SOLN
25.0000 ug | INTRAMUSCULAR | Status: DC | PRN
  Filled 2016-04-02: qty 1

## 2016-04-02 MED ORDER — ONDANSETRON HCL 4 MG/2ML IJ SOLN
4.0000 mg | Freq: Four times a day (QID) | INTRAMUSCULAR | Status: DC | PRN
  Filled 2016-04-02: qty 2

## 2016-04-02 MED ORDER — FENTANYL CITRATE (PF) 100 MCG/2ML IJ SOLN
INTRAMUSCULAR | Status: AC
  Filled 2016-04-02: qty 2

## 2016-04-02 MED ORDER — OXYCODONE HCL 5 MG PO TABS
5.0000 mg | ORAL_TABLET | ORAL | Status: DC | PRN
  Filled 2016-04-02: qty 2

## 2016-04-02 MED ORDER — TRAMADOL HCL 50 MG PO TABS: 50.0000 mg | ORAL_TABLET | Freq: Four times a day (QID) | ORAL | Status: DC | PRN

## 2016-04-02 MED ORDER — IOPAMIDOL (ISOVUE-370) INJECTION 76%
INTRAVENOUS | Status: DC | PRN
  Administered 2016-04-02: 20 mL

## 2016-04-02 MED ORDER — LIDOCAINE HCL (CARDIAC) 20 MG/ML IV SOLN
INTRAVENOUS | Status: AC
  Filled 2016-04-02: qty 5

## 2016-04-02 MED ORDER — PROPOFOL 10 MG/ML IV BOLUS
INTRAVENOUS | Status: AC
  Filled 2016-04-02: qty 20

## 2016-04-02 MED ORDER — CEFAZOLIN SODIUM-DEXTROSE 2-4 GM/100ML-% IV SOLN
2.0000 g | INTRAVENOUS | Status: AC
  Administered 2016-04-02: 2 g via INTRAVENOUS
  Filled 2016-04-02: qty 100

## 2016-04-02 MED ORDER — SODIUM CHLORIDE 0.9 % IV SOLN
250.0000 mL | INTRAVENOUS | Status: DC | PRN
  Filled 2016-04-02: qty 250

## 2016-04-02 MED ORDER — PHENYLEPHRINE HCL 10 MG/ML IJ SOLN
INTRAMUSCULAR | Status: AC
Start: 2016-04-02 — End: 2016-04-02
  Filled 2016-04-02: qty 1

## 2016-04-02 MED ORDER — ACETAMINOPHEN 325 MG PO TABS
650.0000 mg | ORAL_TABLET | ORAL | Status: DC | PRN
  Filled 2016-04-02: qty 2

## 2016-04-02 MED ORDER — PHENYLEPHRINE 40 MCG/ML (10ML) SYRINGE FOR IV PUSH (FOR BLOOD PRESSURE SUPPORT)
PREFILLED_SYRINGE | INTRAVENOUS | Status: AC
  Filled 2016-04-02: qty 10

## 2016-04-02 MED ORDER — ONDANSETRON HCL 4 MG/2ML IJ SOLN
INTRAMUSCULAR | Status: AC
  Filled 2016-04-02: qty 2

## 2016-04-02 MED ORDER — LIDOCAINE HCL (CARDIAC) 20 MG/ML IV SOLN
INTRAVENOUS | Status: DC | PRN
  Administered 2016-04-02: 60 mg via INTRAVENOUS

## 2016-04-02 MED ORDER — ACETAMINOPHEN 650 MG RE SUPP
650.0000 mg | RECTAL | Status: DC | PRN
  Filled 2016-04-02: qty 1

## 2016-04-02 MED ORDER — SODIUM CHLORIDE 0.9% FLUSH
3.0000 mL | INTRAVENOUS | Status: DC | PRN
  Filled 2016-04-02: qty 3

## 2016-04-02 MED ORDER — SODIUM CHLORIDE 0.9 % IR SOLN
Status: DC | PRN
  Administered 2016-04-02: 1000 mL
  Administered 2016-04-02: 3000 mL

## 2016-04-02 MED ORDER — PHENYLEPHRINE HCL 10 MG/ML IJ SOLN
INTRAMUSCULAR | Status: DC | PRN
  Administered 2016-04-02: 40 ug via INTRAVENOUS
  Administered 2016-04-02: 80 ug via INTRAVENOUS

## 2016-04-02 MED ORDER — CEFAZOLIN SODIUM-DEXTROSE 2-3 GM-% IV SOLR
INTRAVENOUS | Status: AC
  Filled 2016-04-02: qty 50

## 2016-04-02 MED ORDER — GENTAMICIN SULFATE 40 MG/ML IJ SOLN
2.0000 mg/kg | Freq: Once | INTRAVENOUS | Status: DC
  Filled 2016-04-02: qty 6

## 2016-04-02 MED ORDER — GENTAMICIN SULFATE 40 MG/ML IJ SOLN
5.0000 mg/kg | Freq: Once | INTRAMUSCULAR | Status: AC
Start: 2016-04-02 — End: 2016-04-02
  Administered 2016-04-02: 400 mg via INTRAVENOUS
  Filled 2016-04-02 (×2): qty 10

## 2016-04-02 MED ORDER — OXYCODONE HCL 5 MG PO TABS
5.0000 mg | ORAL_TABLET | Freq: Once | ORAL | Status: DC | PRN
  Filled 2016-04-02: qty 1

## 2016-04-02 SURGICAL SUPPLY — 31 items
BAG DRAIN URO-CYSTO SKYTR STRL (DRAIN) ×3 IMPLANT
CATH URET 5FR 28IN OPEN ENDED (CATHETERS) ×3 IMPLANT
CLOTH BEACON ORANGE TIMEOUT ST (SAFETY) ×3 IMPLANT
ELECT REM PT RETURN 9FT ADLT (ELECTROSURGICAL)
ELECTRODE REM PT RTRN 9FT ADLT (ELECTROSURGICAL) IMPLANT
FIBER LASER FLEXIVA 1000 (UROLOGICAL SUPPLIES) IMPLANT
FIBER LASER FLEXIVA 365 (UROLOGICAL SUPPLIES) IMPLANT
FIBER LASER FLEXIVA 550 (UROLOGICAL SUPPLIES) IMPLANT
FIBER LASER TRAC TIP (UROLOGICAL SUPPLIES) IMPLANT
GLOVE SURG SS PI 8.0 STRL IVOR (GLOVE) ×3 IMPLANT
GOWN STRL REUS W/ TWL LRG LVL3 (GOWN DISPOSABLE) ×1 IMPLANT
GOWN STRL REUS W/ TWL XL LVL3 (GOWN DISPOSABLE) ×1 IMPLANT
GOWN STRL REUS W/TWL LRG LVL3 (GOWN DISPOSABLE) ×2
GOWN STRL REUS W/TWL XL LVL3 (GOWN DISPOSABLE) ×2
GUIDEWIRE 0.038 PTFE COATED (WIRE) IMPLANT
GUIDEWIRE ANG ZIPWIRE 038X150 (WIRE) IMPLANT
GUIDEWIRE STR DUAL SENSOR (WIRE) ×3 IMPLANT
IV NS 1000ML (IV SOLUTION) ×2
IV NS 1000ML BAXH (IV SOLUTION) ×1 IMPLANT
IV NS IRRIG 3000ML ARTHROMATIC (IV SOLUTION) ×3 IMPLANT
KIT BALLIN UROMAX 15FX10 (LABEL) IMPLANT
KIT BALLN UROMAX 15FX4 (MISCELLANEOUS) IMPLANT
KIT BALLN UROMAX 26 75X4 (MISCELLANEOUS)
KIT ROOM TURNOVER WOR (KITS) ×3 IMPLANT
MANIFOLD NEPTUNE II (INSTRUMENTS) ×3 IMPLANT
PACK CYSTO (CUSTOM PROCEDURE TRAY) ×3 IMPLANT
SET HIGH PRES BAL DIL (LABEL)
SHEATH ACCESS URETERAL 38CM (SHEATH) IMPLANT
STENT URET 6FRX24 CONTOUR (STENTS) ×3 IMPLANT
TUBE CONNECTING 12'X1/4 (SUCTIONS)
TUBE CONNECTING 12X1/4 (SUCTIONS) IMPLANT

## 2016-04-02 NOTE — Anesthesia Preprocedure Evaluation (Signed)
Anesthesia Evaluation  Patient identified by MRN, date of birth, ID band Patient awake    Reviewed: Allergy & Precautions, NPO status , Patient's Chart, lab work & pertinent test results  Airway Mallampati: II   Neck ROM: full    Dental   Pulmonary former smoker,    breath sounds clear to auscultation       Cardiovascular hypertension,  Rhythm:regular Rate:Normal     Neuro/Psych Anxiety    GI/Hepatic GERD  ,  Endo/Other  diabetes, Type obesity  Renal/GU Renal disease     Musculoskeletal   Abdominal   Peds  Hematology   Anesthesia Other Findings   Reproductive/Obstetrics                             Anesthesia Physical Anesthesia Plan  ASA: II  Anesthesia Plan: General   Post-op Pain Management:    Induction: Intravenous  Airway Management Planned: LMA  Additional Equipment:   Intra-op Plan:   Post-operative Plan:   Informed Consent: I have reviewed the patients History and Physical, chart, labs and discussed the procedure including the risks, benefits and alternatives for the proposed anesthesia with the patient or authorized representative who has indicated his/her understanding and acceptance.     Plan Discussed with: CRNA, Anesthesiologist and Surgeon  Anesthesia Plan Comments:         Anesthesia Quick Evaluation

## 2016-04-02 NOTE — Transfer of Care (Signed)
Immediate Anesthesia Transfer of Care Note  Patient: Vanessa DurhamBeverly Nakayama  Procedure(s) Performed: Procedure(s) (LRB): CYSTO, RIGHT RETROGRADE,  RIGHT URETEROSCOPY AND STENT  (Bilateral)  Patient Location: PACU  Anesthesia Type: General  Level of Consciousness: awake, oriented, sedated and patient cooperative  Airway & Oxygen Therapy: Patient Spontanous Breathing and Patient connected to face mask oxygen  Post-op Assessment: Report given to PACU RN and Post -op Vital signs reviewed and stable  Post vital signs: Reviewed and stable  Complications: No apparent anesthesia complications  Last Vitals:  Filed Vitals:   04/02/16 0942 04/02/16 1133  BP: 132/55 138/66  Pulse: 86 85  Temp: 37 C 37.1 C  Resp: 20 20

## 2016-04-02 NOTE — Anesthesia Postprocedure Evaluation (Signed)
Anesthesia Post Note  Patient: Valerie DurhamBeverly [name]  Procedure(s) Performed: Procedure(s) (LRB): CYSTO, RIGHT RETROGRADE,  RIGHT URETEROSCOPY AND STENT  (Bilateral)  Patient location during evaluation: PACU Anesthesia Type: General Level of consciousness: awake and alert and patient cooperative Pain management: pain level controlled Vital Signs Assessment: post-procedure vital signs reviewed and stable Respiratory status: spontaneous breathing and respiratory function stable Cardiovascular status: stable Anesthetic complications: no    Last Vitals:  Filed Vitals:   04/02/16 1215 04/02/16 1230  BP: 147/71 144/73  Pulse: 82 84  Temp:    Resp: 24 15    Last Pain: There were no vitals filed for this visit.               Idrissa Beville S

## 2016-04-02 NOTE — Op Note (Deleted)
NAME:  Strehl, Micaila               ACCOUNT NO.:  [id number]  MEDICAL RECORD NO.:  [id number]  LOCATION:                               FACILITY:  [facility]  PHYSICIAN:  Bane Hagy J. Steffany Schoenfelder, M.D.    DATE OF BIRTH:  12/22/1946  DATE OF PROCEDURE:  04/02/2016 DATE OF DISCHARGE:  04/02/2016                              OPERATIVE REPORT   PROCEDURE:  Cystoscopy with right retrograde pyelogram and interpretation, right ureteroscopy, insertion of right double-J stent.  PREOPERATIVE DIAGNOSIS:  Chronic cystitis with right hydronephrosis.  POSTOPERATIVE DIAGNOSIS:  Chronic cystitis with right hydronephrosis.  SURGEON:  Nikkolas Coomes J. Taeshaun Rames, MD  ANESTHESIA:  General.  SPECIMEN:  Urine for culture and cytology from right renal pelvis.  DRAINS:  A 6-French x 24 cm right double-J stent.  BLOOD LOSS:  Minimal.  COMPLICATIONS:  None.  INDICATIONS:  Ms. Kempe is a 69-year-old, white female, who has had chronic cystitis/sterile pyuria and was found on CT scan to have right hydronephrosis with some rattiness of the right proximal ureter.  No stones were seen.  The left system was decompressed without abnormalities on noncontrast study.  FINDINGS OF PROCEDURE:  She was taken to the operating room where she was given Ancef, a general anesthetic was induced.  She was placed in lithotomy position and perineum and genitalia were prepped with Betadine solution.  She was draped in usual sterile fashion.  Cystoscopy was performed using the 23-French scope and 30-degree lens. Examination revealed a normal urethra.  The urine was initially very cloudy and I irrigated the bladder several times to clear the view.  Of note, the patient had a negative culture recently and in the hospital in January, despite too numerous to count white cells.  Once the bladder had been irrigated, there were a few threads of thick purulent material on the base of the bladder that appeared consistent with inspissated pus from the  ureter.  On inspection, the left ureteral orifice was unremarkable.  The right ureteral orifice was somewhat delicate, but otherwise normal.  No tumors or stones were identified.  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  At this point, because of the delicate nature of the right ureteral orifice, a guidewire was passed through the orifice and up to the kidney a 5-French open-end catheter was then inserted over the wire and the wire was removed.  A specimen was obtained from the renal pelvis approximately 30 mL of bloody purulent material were removed and were separated for specimens for cytology and for regular and fungal cultures.  At this point, contrast was instilled gently demonstrating a somewhat hydronephrotic right collecting system without filling defects; however, there was some irregularity of the proximal ureter.  The more distal ureter was unremarkable.  At this point, the guidewire was reinserted to the kidney and because she documented negative cultures, I felt it was safe to do a gentle ureteroscopy to assess the proximal ureter.  This was done with a slow pressure flow as possible and inspection revealed no clear evidence of obstruction other than some clot and somewhat inflammatory material, but no obvious papillary tumors or certainly stones were   seen.  During fluoroscopic monitoring, there was a little bit of pyelovenous back flow, so I did not pursue a more aggressive inspection for fear of causing bacteremia.  The scope was then gently backed out and the cystoscope was reinserted over the wire.  A 6-French 24 cm double-J stent without tether was inserted to the kidney under fluoroscopic guidance.  The wire was removed leaving good coil in the kidney and a good coil in the bladder.  There was some bloody efflux from the collecting system.  The bladder was then drained.  The cystoscope was removed.  The patient  was taken down from lithotomy position.  Her anesthetic was reversed and she was moved to recovery room in stable condition.  Originally, she was scheduled for bilateral retrograde because of the pathology on the right, I elected not to do the left retrograde, since the CT was unremarkable.     Hershall Benkert J. Unika Nazareno, M.D.     JJW/MEDQ  D:  04/02/2016  T:  04/02/2016  Job:  [id number] 

## 2016-04-02 NOTE — Op Note (Signed)
NAME:  Valerie [name], Valerie [name]               ACCOUNT NO.:  [id number]  MEDICAL RECORD NO.:  [id number]  LOCATION:                               FACILITY:  [facility]  PHYSICIAN:  Sadler Teschner J. Naylani Bradner, M.D.    DATE OF BIRTH:  01/02/1946  DATE OF PROCEDURE:  04/02/2016 DATE OF DISCHARGE:  04/02/2016                              OPERATIVE REPORT   PROCEDURE:  Cystoscopy with right retrograde pyelogram and interpretation, right ureteroscopy, insertion of right double-J stent.  PREOPERATIVE DIAGNOSIS:  Chronic cystitis with right hydronephrosis.  POSTOPERATIVE DIAGNOSIS:  Chronic cystitis with right hydronephrosis.  SURGEON:  Herschell Virani J. Devita Nies, MD  ANESTHESIA:  General.  SPECIMEN:  Urine for culture and cytology from right renal pelvis.  DRAINS:  A 6-French x 24 cm right double-J stent.  BLOOD LOSS:  Minimal.  COMPLICATIONS:  None.  INDICATIONS:  Ms. Valerie [name] is a 70-year-old, white female, who has had chronic cystitis/sterile pyuria and was found on CT scan to have right hydronephrosis with some rattiness of the right proximal ureter.  No stones were seen.  The left system was decompressed without abnormalities on noncontrast study.  FINDINGS OF PROCEDURE:  She was taken to the operating room where she was given Ancef, a general anesthetic was induced.  She was placed in lithotomy position and perineum and genitalia were prepped with Betadine solution.  She was draped in usual sterile fashion.  Cystoscopy was performed using the 23-French scope and 30-degree lens. Examination revealed a normal urethra.  The urine was initially very cloudy and I irrigated the bladder several times to clear the view.  Of note, the patient had a negative culture recently and in the hospital in January, despite too numerous to count white cells.  Once the bladder had been irrigated, there were a few threads of thick purulent material on the base of the bladder that appeared consistent with inspissated pus from the  ureter.  On inspection, the left ureteral orifice was unremarkable.  The right ureteral orifice was somewhat delicate, but otherwise normal.  No tumors or stones were identified.  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  At this point, because of the delicate nature of the right ureteral orifice, a guidewire was passed through the orifice and up to the kidney a 5-French open-end catheter was then inserted over the wire and the wire was removed.  A specimen was obtained from the renal pelvis approximately 30 mL of bloody purulent material were removed and were separated for specimens for cytology and for regular and fungal cultures.  At this point, contrast was instilled gently demonstrating a somewhat hydronephrotic right collecting system without filling defects; however, there was some irregularity of the proximal ureter.  The more distal ureter was unremarkable.  At this point, the guidewire was reinserted to the kidney and because she documented negative cultures, I felt it was safe to do a gentle ureteroscopy to assess the proximal ureter.  This was done with a slow pressure flow as possible and inspection revealed no clear evidence of obstruction other than some clot and somewhat inflammatory material, but no obvious papillary tumors or certainly stones were   seen.  During fluoroscopic monitoring, there was a little bit of pyelovenous back flow, so I did not pursue a more aggressive inspection for fear of causing bacteremia.  The scope was then gently backed out and the cystoscope was reinserted over the wire.  A 6-French 24 cm double-J stent without tether was inserted to the kidney under fluoroscopic guidance.  The wire was removed leaving good coil in the kidney and a good coil in the bladder.  There was some bloody efflux from the collecting system.  The bladder was then drained.  The cystoscope was removed.  The patient  was taken down from lithotomy position.  Her anesthetic was reversed and she was moved to recovery room in stable condition.  Originally, she was scheduled for bilateral retrograde because of the pathology on the right, I elected not to do the left retrograde, since the CT was unremarkable.     Jeronda Don J. Carlo Guevarra, M.D.     JJW/MEDQ  D:  04/02/2016  T:  04/02/2016  Job:  [id number] 

## 2016-04-02 NOTE — Op Note (Deleted)
[place]:  [name], Valerie               ACCOUNT NO.:  [id number]  MEDICAL RECORD NO.:  [id number]  LOCATION:                               [facility]  PHYSICIAN:  Excell SeltzerJohn J. Annabell HowellsWrenn, M.D.    DATE OF BIRTH:  Feb 24, 1946  DATE OF PROCEDURE:  04/02/2016 DATE OF DISCHARGE:  04/02/2016                              OPERATIVE REPORT   PROCEDURE:  Cystoscopy with right retrograde pyelogram and interpretation, right ureteroscopy, insertion of right double-J stent.  PREOPERATIVE DIAGNOSIS:  Chronic cystitis with right hydronephrosis.  POSTOPERATIVE DIAGNOSIS:  Chronic cystitis with right hydronephrosis.  SURGEON:  Excell SeltzerJohn J. Annabell HowellsWrenn, MD  ANESTHESIA:  General.  SPECIMEN:  Urine for culture and cytology from right renal pelvis.  DRAINS:  A 6-French x 24 cm right double-J stent.  BLOOD LOSS:  Minimal.  COMPLICATIONS:  None.  INDICATIONS:  Ms. Valerie [name] is a 70 year old, white female, who has had chronic cystitis/sterile pyuria and was found on CT scan to have right hydronephrosis with some rattiness of the right proximal ureter.  No stones were seen.  The left system was decompressed without abnormalities on noncontrast study.  FINDINGS OF PROCEDURE:  She was taken to the operating room where she was given Ancef, a general anesthetic was induced.  She was placed in lithotomy position and perineum and genitalia were prepped with Betadine solution.  She was draped in usual sterile fashion.  Cystoscopy was performed using the 23-French scope and 30-degree lens. Examination revealed a normal urethra.  The urine was initially very cloudy and I irrigated the bladder several times to clear the view.  Of note, the patient had a negative culture recently and in the hospital in January, despite too numerous to count white cells.  Once the bladder had been irrigated, there were a few threads of thick purulent material on the base of the bladder that appeared consistent with inspissated pus from the  ureter.  On inspection, the left ureteral orifice was unremarkable.  The right ureteral orifice was somewhat delicate, but otherwise normal.  No tumors or stones were identified.  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  At this point, because of the delicate nature of the right ureteral orifice, a guidewire was passed through the orifice and up to the kidney a 5-French open-end catheter was then inserted over the wire and the wire was removed.  A specimen was obtained from the renal pelvis approximately 30 mL of bloody purulent material were removed and were separated for specimens for cytology and for regular and fungal cultures.  At this point, contrast was instilled gently demonstrating a somewhat hydronephrotic right collecting system without filling defects; however, there was some irregularity of the proximal ureter.  The more distal ureter was unremarkable.  At this point, the guidewire was reinserted to the kidney and because she documented negative cultures, I felt it was safe to do a gentle ureteroscopy to assess the proximal ureter.  This was done with a slow pressure flow as possible and inspection revealed no clear evidence of obstruction other than some clot and somewhat inflammatory material, but no obvious papillary tumors or certainly stones were  seen.  During fluoroscopic monitoring, there was a little bit of pyelovenous back flow, so I did not pursue a more aggressive inspection for fear of causing bacteremia.  The scope was then gently backed out and the cystoscope was reinserted over the wire.  A 6-French 24 cm double-J stent without tether was inserted to the kidney under fluoroscopic guidance.  The wire was removed leaving good coil in the kidney and a good coil in the bladder.  There was some bloody efflux from the collecting system.  The bladder was then drained.  The cystoscope was removed.  The patient  was taken down from lithotomy position.  Her anesthetic was reversed and she was moved to recovery room in stable condition.  Originally, she was scheduled for bilateral retrograde because of the pathology on the right, I elected not to do the left retrograde, since the CT was unremarkable.     Excell Seltzer. Annabell Howells, M.D.     JJW/MEDQ  D:  04/02/2016  T:  04/02/2016  Job:  [id number]

## 2016-04-02 NOTE — Discharge Instructions (Addendum)
Ureteral Stent Implantation Ureteral stent implantation is the implantation of a soft plastic tube with multiple holes into the tube that drains urine from your kidney to your bladder (ureter). The stent helps drain your kidney when there is a blockage of the flow of urine in your ureter. The stent has a coil on each end to keep it from falling out. One end stays in the kidney. The other end stays in the bladder. It is most often taken out after any blockage has been removed or your ureter has healed. Short-term stents have a string attached to make removal quite easy. Removal of a short-term stent can be done in your health care provider's office or by you at home. Long-term stents need to be changed every few months. LET [facility] CARE PROVIDER KNOW ABOUT:  Any allergies you have.  All medicines you are taking, including vitamins, herbs, eye drops, creams, and over-the-counter medicines.  Previous problems you or members of your family have had with the use of anesthetics.  Any blood disorders you have.  Previous surgeries you have had.  Medical conditions you have. RISKS AND COMPLICATIONS Generally, ureteral stent implantation is a safe procedure. However, as with any procedure, complications can occur. Possible complications include:  Movement of the stent away from where it was originally placed (migration). This may affect the ability of the stent to properly drain your kidney. If migration of the stent occurs, the stent may need to be replaced or repositioned.  Perforation of the ureter.  Infection. BEFORE THE PROCEDURE  You may be asked to wash your genital area with sterile soap the morning of your procedure.  You may be given an oral antibiotic which you should take with a sip of water as prescribed by your health care provider.  You may be asked to not eat or drink for 8 hours before the surgery. PROCEDURE  First you will be given an anesthetic so you do not feel pain  during the procedure.  Your health care provider will insert a special lighted instrument called a cystoscope into your bladder. This allows your health care provider to see the opening to your ureter.  A thin wire is carefully threaded into your bladder and up the ureter. The stent is inserted over the wire and the wire is then removed.  Your bladder will be emptied of urine. AFTER THE PROCEDURE You will be taken to a recovery room until it is okay for you to go home.   This information is not intended to replace advice given to you by your health care provider. Make sure you discuss any questions you have with your health care provider.   Document Released: 12/11/2000 Document Revised: 12/19/2013 Document Reviewed: 06/28/2015 Elsevier Interactive Patient Education 2016 [place] CARE INSTRUCTIONS  Activity: Rest for the remainder of the day.  Do not drive or operate equipment today.  You may resume normal activities in one to two days as instructed by your physician.   Meals: Drink plenty of liquids and eat light foods such as gelatin or soup this evening.  You may return to a normal meal plan tomorrow.  Return to Work: You may return to work in one to two days or as instructed by your physician.  Special Instructions / Symptoms: Call your physician if any of these symptoms occur:   -persistent or heavy bleeding  -bleeding which continues after first few urination  -large blood clots that are difficult to pass  -  urine stream diminishes or stops completely  -fever equal to or higher than 101 degrees Farenheit.  -cloudy urine with a strong, foul odor  -severe pain  Females should always wipe from front to back after elimination.  You may feel some burning pain when you urinate.  This should disappear with time.  Applying moist heat to the lower abdomen or a hot tub bath may help relieve the pain. \    Patient Signature:   ________________________________________________________  Nurse's Signature:  ________________________________________________________      Post Anesthesia Home Care Instructions  Activity: Get plenty of rest for the remainder of the day. A responsible adult should stay with you for 24 hours following the procedure.  For the next 24 hours, DO NOT: -Drive a car -Advertising copywriterperate machinery -Drink alcoholic beverages -Take any medication unless instructed by your physician -Make any legal decisions or sign important papers.  Meals: Start with liquid foods such as gelatin or soup. Progress to regular foods as tolerated. Avoid greasy, spicy, heavy foods. If nausea and/or vomiting occur, drink only clear liquids until the nausea and/or vomiting subsides. Call your physician if vomiting continues.  Special Instructions/Symptoms: Your throat may feel dry or sore from the anesthesia or the breathing tube placed in your throat during surgery. If this causes discomfort, gargle with warm salt water. The discomfort should disappear within 24 hours.  If you had a scopolamine patch placed behind your ear for the management of post- operative nausea and/or vomiting:  1. The medication in the patch is effective for 72 hours, after which it should be removed.  Wrap patch in a tissue and discard in the trash. Wash hands thoroughly with soap and water. 2. You may remove the patch earlier than 72 hours if you experience unpleasant side effects which may include dry mouth, dizziness or visual disturbances. 3. Avoid touching the patch. Wash your hands with soap and water after contact with the patch.

## 2016-04-02 NOTE — Interval H&P Note (Signed)
History and Physical Interval Note:  04/02/2016 10:20 AM  Valerie [name]  has presented today for surgery, with the diagnosis of CHRONIC CYSTITIS, RIGHT HYDRONEPHROSIS  The various methods of treatment have been discussed with the patient and family. After consideration of risks, benefits and other options for treatment, the patient has consented to  Procedure(s): CYSTO, BILATERIAL RETROGRADE, POSSIBLE RIGHT URETEROSCOPY AND STENT  (Bilateral) as a surgical intervention .  The patient's history has been reviewed, patient examined, no change in status, stable for surgery.  I have reviewed the patient's chart and labs.  Questions were answered to the patient's satisfaction.     Caz Weaver J

## 2016-04-02 NOTE — Anesthesia Procedure Notes (Signed)
Procedure Name: LMA Insertion Date/Time: 04/02/2016 10:48 AM Performed by: Renella CunasHAZEL, Deanna Boehlke D Pre-anesthesia Checklist: Patient identified, Emergency Drugs available, Suction available and Patient being monitored Patient Re-evaluated:Patient Re-evaluated prior to inductionOxygen Delivery Method: Circle System Utilized Preoxygenation: Pre-oxygenation with 100% oxygen Intubation Type: IV induction Ventilation: Mask ventilation without difficulty LMA: LMA inserted LMA Size: 4.0 Number of attempts: 1 Airway Equipment and Method: Bite block Placement Confirmation: positive ETCO2 Tube secured with: Tape Dental Injury: Teeth and Oropharynx as per pre-operative assessment

## 2016-04-02 NOTE — Brief Op Note (Signed)
04/02/2016  11:19 AM  PATIENT:  Valerie DurhamBeverly [name]  70 y.o. female  PRE-OPERATIVE DIAGNOSIS:  CHRONIC CYSTITIS, RIGHT HYDRONEPHROSIS  POST-OPERATIVE DIAGNOSIS:  CHRONIC CYSTITIS, RIGHT HYDRONEPHROSIS  PROCEDURE:  Procedure(s): CYSTO, RIGHT RETROGRADE, POSSIBLE RIGHT URETEROSCOPY AND STENT  (Bilateral)  SURGEON:  Surgeon(s) and Role:    * Bjorn PippinJohn Irby Fails, MD - Primary  PHYSICIAN ASSISTANT:   ASSISTANTS: none   ANESTHESIA:   general  EBL:  Total I/O In: 200 [I.V.:200] Out: 0   BLOOD ADMINISTERED:none  DRAINS: right 6 x 24 JJ stent   LOCAL MEDICATIONS USED:  NONE  SPECIMEN:  Source of Specimen:  right renal pelvis  DISPOSITION OF SPECIMEN:  PATHOLOGY  COUNTS:  YES  TOURNIQUET:  * No tourniquets in log *  DICTATION: .Other Dictation: Dictation Number [id number]  PLAN OF CARE: Discharge to home after PACU  PATIENT DISPOSITION:  PACU - hemodynamically stable.   Delay start of Pharmacological VTE agent (>24hrs) due to surgical blood loss or risk of bleeding: not applicable

## 2016-04-03 ENCOUNTER — Encounter (HOSPITAL_BASED_OUTPATIENT_CLINIC_OR_DEPARTMENT_OTHER): Payer: Self-pay | Admitting: Urology

## 2016-04-03 LAB — URINE CULTURE: Culture: NO GROWTH

## 2016-04-10 LAB — FUNGUS CULTURE WITH STAIN

## 2016-04-10 LAB — FUNGUS CULTURE RESULT

## 2016-04-10 LAB — FUNGAL ORGANISM REFLEX

## 2016-04-13 ENCOUNTER — Other Ambulatory Visit: Payer: Self-pay | Admitting: Urology

## 2016-04-15 ENCOUNTER — Encounter (HOSPITAL_BASED_OUTPATIENT_CLINIC_OR_DEPARTMENT_OTHER): Payer: Self-pay | Admitting: *Deleted

## 2016-04-15 NOTE — Progress Notes (Signed)
NPO AFTER MN.  ARRIVE AT 0715.  NEEDS ISTAT.  CURRENT EKG AND CXR IN CHART AND EPIC.  WILL TAKE PRILOSEC AND NORVASC AM DOS W/ SIPS OF WATER.

## 2016-04-21 ENCOUNTER — Ambulatory Visit (HOSPITAL_BASED_OUTPATIENT_CLINIC_OR_DEPARTMENT_OTHER): Payer: Medicare Other | Admitting: Anesthesiology

## 2016-04-21 ENCOUNTER — Encounter (HOSPITAL_BASED_OUTPATIENT_CLINIC_OR_DEPARTMENT_OTHER): Payer: Self-pay | Admitting: Anesthesiology

## 2016-04-21 ENCOUNTER — Encounter (HOSPITAL_BASED_OUTPATIENT_CLINIC_OR_DEPARTMENT_OTHER)
Admission: RE | Disposition: A | Discharge status: Home / Self Care | Payer: Self-pay | Source: Ambulatory Visit | Attending: Urology

## 2016-04-21 ENCOUNTER — Ambulatory Visit (HOSPITAL_BASED_OUTPATIENT_CLINIC_OR_DEPARTMENT_OTHER)
Admission: RE | Admit: 2016-04-21 | Discharge: 2016-04-21 | Disposition: A | Discharge status: Home / Self Care | Payer: Medicare Other | Source: Ambulatory Visit | Attending: Urology | Admitting: Urology

## 2016-04-21 DIAGNOSIS — E7800 Pure hypercholesterolemia, unspecified: Secondary | ICD-10-CM | POA: Diagnosis not present

## 2016-04-21 DIAGNOSIS — Z7951 Long term (current) use of inhaled steroids: Secondary | ICD-10-CM | POA: Diagnosis not present

## 2016-04-21 DIAGNOSIS — E119 Type 2 diabetes mellitus without complications: Secondary | ICD-10-CM | POA: Insufficient documentation

## 2016-04-21 DIAGNOSIS — N1330 Unspecified hydronephrosis: Secondary | ICD-10-CM | POA: Insufficient documentation

## 2016-04-21 DIAGNOSIS — N952 Postmenopausal atrophic vaginitis: Secondary | ICD-10-CM | POA: Insufficient documentation

## 2016-04-21 DIAGNOSIS — Z6841 Body Mass Index (BMI) 40.0 and over, adult: Secondary | ICD-10-CM | POA: Diagnosis not present

## 2016-04-21 DIAGNOSIS — Z87891 Personal history of nicotine dependence: Secondary | ICD-10-CM | POA: Insufficient documentation

## 2016-04-21 DIAGNOSIS — F419 Anxiety disorder, unspecified: Secondary | ICD-10-CM | POA: Insufficient documentation

## 2016-04-21 DIAGNOSIS — N3020 Other chronic cystitis without hematuria: Secondary | ICD-10-CM | POA: Insufficient documentation

## 2016-04-21 DIAGNOSIS — Z87440 Personal history of urinary (tract) infections: Secondary | ICD-10-CM | POA: Diagnosis not present

## 2016-04-21 DIAGNOSIS — N059 Unspecified nephritic syndrome with unspecified morphologic changes: Secondary | ICD-10-CM | POA: Insufficient documentation

## 2016-04-21 DIAGNOSIS — Z794 Long term (current) use of insulin: Secondary | ICD-10-CM | POA: Insufficient documentation

## 2016-04-21 DIAGNOSIS — R310 Gross hematuria: Secondary | ICD-10-CM | POA: Diagnosis not present

## 2016-04-21 DIAGNOSIS — K219 Gastro-esophageal reflux disease without esophagitis: Secondary | ICD-10-CM | POA: Insufficient documentation

## 2016-04-21 DIAGNOSIS — R3914 Feeling of incomplete bladder emptying: Secondary | ICD-10-CM | POA: Insufficient documentation

## 2016-04-21 DIAGNOSIS — D649 Anemia, unspecified: Secondary | ICD-10-CM | POA: Diagnosis not present

## 2016-04-21 DIAGNOSIS — I10 Essential (primary) hypertension: Secondary | ICD-10-CM | POA: Insufficient documentation

## 2016-04-21 DIAGNOSIS — Z79899 Other long term (current) drug therapy: Secondary | ICD-10-CM | POA: Diagnosis not present

## 2016-04-21 HISTORY — DX: Type 2 diabetes mellitus without complications: E11.9

## 2016-04-21 HISTORY — DX: Type 2 diabetes mellitus with diabetic neuropathy, unspecified: E11.40

## 2016-04-21 HISTORY — DX: Allergic rhinitis, unspecified: J30.9

## 2016-04-21 HISTORY — DX: Personal history of other diseases of the digestive system: Z87.19

## 2016-04-21 HISTORY — DX: Personal history of urinary (tract) infections: Z87.440

## 2016-04-21 HISTORY — DX: Type 2 diabetes mellitus without complications: Z79.4

## 2016-04-21 HISTORY — PX: CYSTOSCOPY WITH URETEROSCOPY: SHX5123

## 2016-04-21 HISTORY — DX: Personal history of other diseases of the respiratory system: Z87.09

## 2016-04-21 LAB — POCT I-STAT, CHEM 8
BUN: 26 mg/dL — AB (ref 6–20)
CHLORIDE: 104 mmol/L (ref 101–111)
CREATININE: 0.9 mg/dL (ref 0.44–1.00)
Calcium, Ion: 1.33 mmol/L — ABNORMAL HIGH (ref 1.13–1.30)
Glucose, Bld: 70 mg/dL (ref 65–99)
HEMATOCRIT: 36 % (ref 36.0–46.0)
Hemoglobin: 12.2 g/dL (ref 12.0–15.0)
POTASSIUM: 3.9 mmol/L (ref 3.5–5.1)
SODIUM: 144 mmol/L (ref 135–145)
TCO2: 23 mmol/L (ref 0–100)

## 2016-04-21 LAB — GLUCOSE, CAPILLARY: Glucose-Capillary: 102 mg/dL — ABNORMAL HIGH (ref 65–99)

## 2016-04-21 SURGERY — CYSTOSCOPY WITH URETEROSCOPY
Anesthesia: General | Site: Ureter | Laterality: Right

## 2016-04-21 MED ORDER — SODIUM CHLORIDE 0.9% FLUSH
3.0000 mL | INTRAVENOUS | Status: DC | PRN
  Filled 2016-04-21: qty 3

## 2016-04-21 MED ORDER — PHENYLEPHRINE HCL 10 MG/ML IJ SOLN
INTRAMUSCULAR | Status: DC | PRN
  Administered 2016-04-21: 120 ug via INTRAVENOUS
  Administered 2016-04-21: 80 ug via INTRAVENOUS
  Administered 2016-04-21: 120 ug via INTRAVENOUS
  Administered 2016-04-21: 80 ug via INTRAVENOUS

## 2016-04-21 MED ORDER — SODIUM CHLORIDE 0.9 % IV SOLN
250.0000 mL | INTRAVENOUS | Status: DC | PRN
  Filled 2016-04-21: qty 250

## 2016-04-21 MED ORDER — OXYCODONE HCL 5 MG PO TABS
5.0000 mg | ORAL_TABLET | ORAL | Status: DC | PRN
  Administered 2016-04-21: 5 mg via ORAL
  Filled 2016-04-21: qty 2

## 2016-04-21 MED ORDER — SODIUM CHLORIDE 0.9 % IV SOLN
INTRAVENOUS | Status: DC
  Administered 2016-04-21: 09:00:00 via INTRAVENOUS
  Filled 2016-04-21: qty 1000

## 2016-04-21 MED ORDER — ACETAMINOPHEN 650 MG RE SUPP
650.0000 mg | RECTAL | Status: DC | PRN
  Filled 2016-04-21: qty 1

## 2016-04-21 MED ORDER — FENTANYL CITRATE (PF) 100 MCG/2ML IJ SOLN
INTRAMUSCULAR | Status: DC | PRN
Start: 2016-04-21 — End: 2016-04-21
  Administered 2016-04-21 (×2): 50 ug via INTRAVENOUS

## 2016-04-21 MED ORDER — HYDROCODONE-ACETAMINOPHEN 5-325 MG PO TABS: 1.0000 | ORAL_TABLET | Freq: Four times a day (QID) | ORAL | Status: DC | PRN

## 2016-04-21 MED ORDER — PROPOFOL 10 MG/ML IV BOLUS
INTRAVENOUS | Status: DC | PRN
  Administered 2016-04-21: 150 mg via INTRAVENOUS

## 2016-04-21 MED ORDER — LACTATED RINGERS IV SOLN: INTRAVENOUS | Status: DC | PRN

## 2016-04-21 MED ORDER — FENTANYL CITRATE (PF) 100 MCG/2ML IJ SOLN
25.0000 ug | INTRAMUSCULAR | Status: DC | PRN
  Filled 2016-04-21: qty 1

## 2016-04-21 MED ORDER — SODIUM CHLORIDE 0.9% FLUSH
3.0000 mL | Freq: Two times a day (BID) | INTRAVENOUS | Status: DC
  Filled 2016-04-21: qty 3

## 2016-04-21 MED ORDER — CEFAZOLIN SODIUM 1-5 GM-% IV SOLN
1.0000 g | INTRAVENOUS | Status: DC
  Filled 2016-04-21: qty 50

## 2016-04-21 MED ORDER — LACTATED RINGERS IV SOLN
INTRAVENOUS | Status: DC
  Filled 2016-04-21: qty 1000

## 2016-04-21 MED ORDER — ONDANSETRON HCL 4 MG/2ML IJ SOLN
INTRAMUSCULAR | Status: DC | PRN
  Administered 2016-04-21: 4 mg via INTRAVENOUS

## 2016-04-21 MED ORDER — ACETAMINOPHEN 325 MG PO TABS
650.0000 mg | ORAL_TABLET | ORAL | Status: DC | PRN
  Filled 2016-04-21: qty 2

## 2016-04-21 MED ORDER — CEFAZOLIN SODIUM-DEXTROSE 2-4 GM/100ML-% IV SOLN
2.0000 g | INTRAVENOUS | Status: AC
  Administered 2016-04-21: 2 g via INTRAVENOUS
  Filled 2016-04-21: qty 100

## 2016-04-21 MED ORDER — SODIUM CHLORIDE 0.9 % IR SOLN
Status: DC | PRN
  Administered 2016-04-21 (×2): 1000 mL via INTRAVESICAL

## 2016-04-21 MED ORDER — FLUCONAZOLE 100 MG PO TABS: 100.0000 mg | ORAL_TABLET | Freq: Every day | ORAL | Status: DC

## 2016-04-21 MED ORDER — LIDOCAINE HCL (CARDIAC) 20 MG/ML IV SOLN
INTRAVENOUS | Status: DC | PRN
  Administered 2016-04-21: 60 mg via INTRAVENOUS

## 2016-04-21 MED ORDER — DEXAMETHASONE SODIUM PHOSPHATE 4 MG/ML IJ SOLN
INTRAMUSCULAR | Status: DC | PRN
  Administered 2016-04-21: 10 mg via INTRAVENOUS

## 2016-04-21 SURGICAL SUPPLY — 31 items
BAG DRAIN URO-CYSTO SKYTR STRL (DRAIN) ×2 IMPLANT
BASKET LASER NITINOL 1.9FR (BASKET) IMPLANT
BASKET STNLS GEMINI 4WIRE 3FR (BASKET) IMPLANT
BASKET ZERO TIP NITINOL 2.4FR (BASKET) ×2 IMPLANT
CATH URET 5FR 28IN CONE TIP (BALLOONS)
CATH URET 5FR 28IN OPEN ENDED (CATHETERS) IMPLANT
CATH URET 5FR 70CM CONE TIP (BALLOONS) IMPLANT
CLOTH BEACON ORANGE TIMEOUT ST (SAFETY) ×2 IMPLANT
FIBER LASER FLEXIVA 1000 (UROLOGICAL SUPPLIES) IMPLANT
FIBER LASER FLEXIVA 365 (UROLOGICAL SUPPLIES) IMPLANT
FIBER LASER FLEXIVA 550 (UROLOGICAL SUPPLIES) IMPLANT
FIBER LASER TRAC TIP (UROLOGICAL SUPPLIES) IMPLANT
GLOVE SURG SS PI 8.0 STRL IVOR (GLOVE) ×2 IMPLANT
GOWN STRL REUS W/ TWL XL LVL3 (GOWN DISPOSABLE) ×1 IMPLANT
GOWN STRL REUS W/TWL XL LVL3 (GOWN DISPOSABLE) ×1
GOWN XL W/COTTON TOWEL STD (GOWNS) ×2 IMPLANT
GUIDEWIRE 0.038 PTFE COATED (WIRE) IMPLANT
GUIDEWIRE ANG ZIPWIRE 038X150 (WIRE) IMPLANT
GUIDEWIRE STR DUAL SENSOR (WIRE) ×2 IMPLANT
IV NS 1000ML (IV SOLUTION) ×2
IV NS 1000ML BAXH (IV SOLUTION) ×2 IMPLANT
KIT BALLIN UROMAX 15FX10 (LABEL) IMPLANT
KIT BALLN UROMAX 15FX4 (MISCELLANEOUS) IMPLANT
KIT BALLN UROMAX 26 75X4 (MISCELLANEOUS)
KIT ROOM TURNOVER WOR (KITS) ×2 IMPLANT
MANIFOLD NEPTUNE II (INSTRUMENTS) ×2 IMPLANT
PACK CYSTO (CUSTOM PROCEDURE TRAY) ×2 IMPLANT
SET HIGH PRES BAL DIL (LABEL)
SHEATH ACCESS URETERAL 38CM (SHEATH) IMPLANT
STENT URET 6FRX24 CONTOUR (STENTS) ×2 IMPLANT
TUBE CONNECTING 12X1/4 (SUCTIONS) ×2 IMPLANT

## 2016-04-21 NOTE — Anesthesia Procedure Notes (Signed)
Procedure Name: LMA Insertion Date/Time: 04/21/2016 9:04 AM Performed by: Tyrone NineSAUVE, Garyn Waguespack F Pre-anesthesia Checklist: Patient identified, Timeout performed, Emergency Drugs available, Suction available and Patient being monitored Patient Re-evaluated:Patient Re-evaluated prior to inductionOxygen Delivery Method: Circle system utilized Preoxygenation: Pre-oxygenation with 100% oxygen Intubation Type: IV induction Ventilation: Mask ventilation without difficulty LMA Size: 4.0 Number of attempts: 1 Placement Confirmation: positive ETCO2 and breath sounds checked- equal and bilateral Tube secured with: Tape Dental Injury: Teeth and Oropharynx as per pre-operative assessment

## 2016-04-21 NOTE — Anesthesia Preprocedure Evaluation (Addendum)
Anesthesia Evaluation  Patient identified by MRN, date of birth, ID band Patient awake    Reviewed: Allergy & Precautions, NPO status , Patient's Chart, lab work & pertinent test results  Airway Mallampati: II  TM Distance: >3 FB Neck ROM: Full    Dental  (+) Dental Advisory Given, Partial Upper, Chipped, Missing,    Pulmonary former smoker,  History respiratory failure   Pulmonary exam normal breath sounds clear to auscultation       Cardiovascular hypertension, Pt. on medications Normal cardiovascular exam Rhythm:Regular Rate:Normal     Neuro/Psych negative neurological ROS     GI/Hepatic GERD  Medicated,SBO Wound Vac in Place   Endo/Other  diabetes, Well Controlled, Type 2, Insulin DependentMorbid obesity  Renal/GU ARFRenal diseaseStage 4 chronic kidney disease     Musculoskeletal   Abdominal (+) + obese,   Peds  Hematology  (+) anemia ,   Anesthesia Other Findings   Reproductive/Obstetrics                        Anesthesia Physical Anesthesia Plan  ASA: III  Anesthesia Plan: General   Post-op Pain Management:    Induction:   Airway Management Planned: Oral ETT  Additional Equipment:   Intra-op Plan:   Post-operative Plan: Extubation in OR  Informed Consent:   Plan Discussed with: Surgeon  Anesthesia Plan Comments:        Anesthesia Quick Evaluation

## 2016-04-21 NOTE — Brief Op Note (Signed)
04/21/2016  9:35 AM  PATIENT:  Tyna JakschBeverly H Bold  70 y.o. female  PRE-OPERATIVE DIAGNOSIS:  RIGHT HYDRONEPHROSIS   POST-OPERATIVE DIAGNOSIS:  RIGHT HYDRONEPHROSIS with INTRARENAL INFLAMMATION  PROCEDURE:  Procedure(s): CYSTOSCOPY WITH RIGHT URETEROSCOPY (Right) Biopsy of ureteral lesion, removal and replacement of right ureteral stent.   SURGEON:  Surgeon(s) and Role:    * Bjorn PippinJohn Jaelin Fackler, MD - Primary  PHYSICIAN ASSISTANT:   ASSISTANTS: none   ANESTHESIA:   general  EBL:     BLOOD ADMINISTERED:none  DRAINS: 6 x 24 JJ stent   LOCAL MEDICATIONS USED:  NONE  SPECIMEN:  Source of Specimen:  right renal pelvis and proximal ureter  DISPOSITION OF SPECIMEN:  PATHOLOGY  COUNTS:  YES  TOURNIQUET:  * No tourniquets in log *  DICTATION: .Other Dictation: Dictation Number [id number]  PLAN OF CARE: Discharge to home after PACU  PATIENT DISPOSITION:  PACU - hemodynamically stable.   Delay start of Pharmacological VTE agent (>24hrs) due to surgical blood loss or risk of bleeding: not applicable

## 2016-04-21 NOTE — Transfer of Care (Signed)
Immediate Anesthesia Transfer of Care Note  Patient: Valerie [name]  Procedure(s) Performed: Procedure(s): CYSTOSCOPY WITH RIGHT URETEROSCOPY; RIGHT STENT EXCHANGE (Right)  Patient Location: PACU  Anesthesia Type:General  Level of Consciousness: awake, alert , oriented and patient cooperative  Airway & Oxygen Therapy: Patient Spontanous Breathing and Patient connected to nasal cannula oxygen  Post-op Assessment: Report given to RN and Post -op Vital signs reviewed and stable  Post vital signs: Reviewed and stable  Last Vitals:  Filed Vitals:   04/21/16 0731  Pulse: 91  Temp: 36.7 C  Resp: 16    Complications: No apparent anesthesia complications

## 2016-04-21 NOTE — H&P (View-Only) (Signed)
Chief Complaint history of frequent urination   Active Problems Problems  1. 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 Chronic cystitis (N30.20) 3. Feeling of incomplete bladder emptying (R39.14) 4. Gross hematuria (R31.0) 5. Hydronephrosis, right (N13.30)  History of Present Illness Valerie [name] is a 70 yo WF who is sent in consultation by Dr. Ehinger for recurrent UTI's. She has had recurring infections for about 3 years that was associated with frequency and urgency but also gross blood on occasion. She had no pain with the infections. She had no fever. She is currently symptom free following a hospitalization for SBO that required surgical correction and had antibiotics during that time and remains on an doxycyline for cellulitis. She had yeast on her culture from 01/16/16. She has no incontinence at this time.  She has had several CT scans over the last 3 years that showed mild right hydronephrosis with the most recent showing some mucosal thickening. There is no significant dilation of the ureter. No stones were seen and she has no history of one. She has had no GU surgery. She has no pneumaturia.   Past Medical History Problems  1. History of Anxiety (F41.9) 2. History of arthritis (Z87.39) 3. History of diabetes mellitus (Z86.39) 4. History of esophageal reflux (Z87.19) 5. History of hypercholesterolemia (Z86.39) 6. History of hypertension (Z86.79) 7. History of pancreatitis (Z87.19) 8. History of small bowel obstruction (Z87.19)  Surgical History Problems  1. History of Pancreatic Surgery 2. History of Small Bowel Resection 3. History of Umbilical Hernia Repair  Current Meds 1. Allegra CAPS;  Therapy: (Recorded:22Mar2017) to Recorded 2. ALPRAZolam 0.25 MG Oral Tablet;  Therapy: (Recorded:22Mar2017) to Recorded 3. AmLODIPine Besylate 10 MG Oral Tablet;  Therapy: (Recorded:22Mar2017) to Recorded 4. Cranberry TABS;  Therapy: (Recorded:22Mar2017) to Recorded 5. Fenofibrate CAPS;  Therapy: (Recorded:22Mar2017) to Recorded 6. Fish Oil CAPS;  Therapy: (Recorded:22Mar2017) to Recorded 7. Flonase 50 MCG/ACT SUSP;  Therapy: (Recorded:22Mar2017) to Recorded 8. Furosemide 20 MG Oral Tablet;  Therapy: (Recorded:22Mar2017) to Recorded 9. HumuLIN R 100 UNIT/ML Injection Solution;  Therapy: (Recorded:22Mar2017) to Recorded 10. Hydrocerin External Lotion;   Therapy: (Recorded:22Mar2017) to Recorded 11. HydroCHLOROthiazide 12.5 MG Oral Tablet;   Therapy: (Recorded:22Mar2017) to Recorded 12. Kayexalate POWD;   Therapy: (Recorded:22Mar2017) to Recorded 13. Lipitor 10 MG Oral Tablet;   Therapy: (Recorded:22Mar2017) to Recorded 14. Mag-Ox 400 MG TABS;   Therapy: (Recorded:22Mar2017) to Recorded 15. Methocarbamol 500 MG Oral Tablet;   Therapy: (Recorded:22Mar2017) to Recorded 16. Mucinex 600 MG TBCR;   Therapy: (Recorded:22Mar2017) to Recorded 17. Myrbetriq 50 MG Oral Tablet Extended Release 24 Hour;   Therapy: (Recorded:22Mar2017) to Recorded 18. Ondansetron 4 MG Oral Tablet Dispersible;   Therapy: (Recorded:22Mar2017) to Recorded 19. Pantoprazole Sodium 40 MG Oral Tablet Delayed Release;   Therapy: (Recorded:22Mar2017) to Recorded 20. Potassium Chloride Crys ER 20 MEQ Oral Tablet Extended Release;   Therapy: (Recorded:22Mar2017) to Recorded 21. Senna-Docusate Sodium 8.6-50 MG Oral Tablet;   Therapy: (Recorded:22Mar2017) to Recorded 22. TiZANidine HCl - 2 MG Oral Tablet;   Therapy: (Recorded:22Mar2017) to Recorded 23. TraMADol HCl - 50 MG Oral Tablet;   Therapy: (Recorded:22Mar2017) to Recorded 24. Tylenol TABS;   Therapy: (Recorded:22Mar2017) to Recorded  Allergies Medication  1. No Known Drug Allergies  Family History Problems  1. Family history of Death of family member : Mother, Father   mother age 92 of  CHF      Dad  age 92 of  kidney failure 2. Family history of prostate cancer (Z80.42) : Father  Social History Problems      Denied: History of Alcohol  use   Caffeine use (F15.90)   1 a day   Former smoker (Z87.891)   Married   Number of children   2 sons (twins)     1 daughter   Retired   History of Tobacco use (Z72.0)   2 ppd x 20 years  Review of Systems Genitourinary, constitutional, skin, eye, otolaryngeal, hematologic/lymphatic, cardiovascular, pulmonary, endocrine, musculoskeletal, gastrointestinal, neurological and psychiatric system(s) were reviewed and pertinent findings if present are noted and are otherwise negative.  Genitourinary: urinary frequency and nocturia (She has occasional vaginal bleeding. ).  ENT: sinus problems.  Cardiovascular: leg swelling.  Respiratory: shortness of breath.  Musculoskeletal: back pain.  Psychiatric: anxiety.    Vitals Vital Signs [Data Includes: Last 1 Day]  Recorded: 22Mar2017 09:29AM  Blood Pressure: 107 / 61 Heart Rate: 78 Recorded: 22Mar2017 09:26AM  Height: 5 ft 4 in Weight: 249 lb  BMI Calculated: 42.74 BSA Calculated: 2.15 Temperature: 96.9 F  Physical Exam Constitutional: Well nourished and well developed . No acute distress.  ENT:. The ears and nose are normal in appearance.  Neck: The appearance of the neck is normal and no neck mass is present.  Pulmonary: No respiratory distress and normal respiratory rhythm and effort.  Cardiovascular: Heart rate and rhythm are normal . No peripheral edema.  Abdomen: Incision site(s) healing well (She has a large transverse lower abdominal incision with a small area in the midline covered with a vac appliance. ). The abdomen is obese (morbidly). The abdomen is soft and nontender (with the exception of some induration from the mesh in the area of her incision. ). No masses are palpated. No CVA tenderness. No hernias are palpable. No hepatosplenomegaly noted.  Genitourinary:. She has a large lobular panniculus covering the genitalia. I was unable to do a bimanual exam.  Chaperone Present: Erica.  Examination of the external  genitalia shows vulvar atrophy (marked with stenosis). The urethra is normal in appearance. Vaginal exam demonstrates atrophy and the vaginal epithelium to be poorly estrogenized. Postvoid residual urine is 200 mL. The anus is normal on inspection. The perineum is normal on inspection.  Lymphatics: The femoral and inguinal nodes are not enlarged or tender.  Skin: Normal skin turgor, no visible rash and no visible skin lesions.  Neuro/Psych:. Mood and affect are appropriate. Normal sensation of the perineum/perianal region (S3,4,5).    Results/Data Urine [Data Includes: Last 1 Day]   22Mar2017  COLOR YELLOW  YELLOW   APPEARANCE CLOUDY  CLOUDY   SPECIFIC GRAVITY 1.015  1.010   pH 5.5  5.0   GLUCOSE NEGATIVE  NEGATIVE   BILIRUBIN NEGATIVE  NEGATIVE   KETONE NEGATIVE  NEGATIVE   BLOOD 3+  3+   PROTEIN 1+  NEGATIVE   NITRITE NEGATIVE  NEGATIVE   LEUKOCYTE ESTERASE 3+  3+   SQUAMOUS EPITHELIAL/HPF 0-5 HPF 0-5 HPF  WBC >60 WBC/HPF >60 WBC/HPF  RBC 20-40 RBC/HPF 10-20 RBC/HPF  BACTERIA FEW HPF  HPF  CRYSTALS See Below HPF NONE SEEN HPF  CASTS NONE SEEN LPF NONE SEEN LPF  Yeast NONE SEEN HPF NONE SEEN HPF   Old records or history reviewed: I have reviewed her hospital notes and records from Dr. Ehinger.  The following images/tracing/specimen were independently visualized:  I have reviewed 3 CT films dating back to 2014.  The following clinical lab reports were reviewed:  I have reviewed her UA's today along with her UA's and cultures from her hospitalization and her hospital   labs. She has minimal renal insufficiency.    Procedure Sterile cath was performed with a 14 fr straight cath with a 200ml PVR and cloudy urine.     Assessment Assessed  1. Gross hematuria (R31.0) 2. Chronic cystitis (N30.20) 3. Hydronephrosis, right (N13.30) 4. 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 Feeling of incomplete bladder emptying (R39.14)  She has chronic cystitis with chronic right hydronephrosis with mucosal  thickening and has had hematuria.  She has an elevated PVR.   She had yeast on a culture in the hospital.  She is currently symptom free post antibiotics and remains on doxycycline but her UA remains markedly abnormal.  She has atrophic vaginitis with some vaginal bleeding.   Plan Chronic cystitis  1. Get Outside Records Office  Follow-up  Status: Hold For - Records,Records Release   Requested for: 22Mar2017 2. URINE CULTURE; Status:In Progress - Specimen/Data Collected;   Done: 22Mar2017 Gross hematuria  3. Cath for PVR, NO UA; Status:Complete;   Done: 22Mar2017 4. Pelvic Exam; Status:Complete;   Done: 22Mar2017 Health Maintenance  5. UA With REFLEX; [Do Not Release]; Status:Resulted - Requires Verification;   Done:  22Mar2017 08:44AM 6. UA With REFLEX; [Do Not Release]; Status:Resulted - Requires Verification;   Done:  22Mar2017 10:07AM  Urine culture today from cath specimen.  I am going to get her set up for cystoscopy with right retrograde pyelography along with possible ureteroscopy and stenting.  The risks were reviewed in detail.  I will get records from her prior urologist in [place], Dr. Eskue.    

## 2016-04-21 NOTE — Discharge Instructions (Addendum)
Please hold the atorvastatin while taking the fluconazole.    I will need to adjust your follow up appointment because I will need to remove the stent in the office.     [facility] [phone] Post Ureteroscopy With or Without Stent Instructions  Definitions:  Ureter: The duct that transports urine from the kidney to the bladder. Stent:   A plastic hollow tube that is placed into the ureter, from the kidney to the bladder to prevent the ureter from swelling shut.  GENERAL INSTRUCTIONS:  Despite the fact that no skin incisions were used, the area around the ureter and bladder is raw and irritated. The stent is a foreign body which will further irritate the bladder wall. This irritation is manifested by increased frequency of urination, both day and night, and by an increase in the urge to urinate. In some, the urge to urinate is present almost always. Sometimes the urge is strong enough that you may not be able to stop yourself from urinating. The only real cure is to remove the stent and then give time for the bladder wall to heal which can't be done until the danger of the ureter swelling shut has passed, which varies.  You may see some blood in your urine while the stent is in place and a few days afterwards. Do not be alarmed, even if the urine was clear for a while. Get off your feet and drink lots of fluids until clearing occurs. If you start to pass clots or don't improve, call us.  DIET: You may return to your normal diet immediately. Because of the raw surface of your bladder, alcohol, spicy foods, acid type foods and drinks with caffeine may cause irritation or frequency and should be used in moderation. To keep your urine flowing freely and to avoid constipation, drink plenty of fluids during the day ( 8-10 glasses ). Tip: Avoid cranberry juice because it is very acidic.  ACTIVITY: Your physical activity doesn't need to be restricted. However, if you are  very active, you may see some blood in your urine. We suggest that you reduce your activity under these circumstances until the bleeding has stopped.  BOWELS: It is important to keep your bowels regular during the postoperative period. Straining with bowel movements can cause bleeding. A bowel movement every other day is reasonable. Use a mild laxative if needed, such as Milk of Magnesia 2-3 tablespoons, or 2 Dulcolax tablets. Call if you continue to have problems. If you have been taking narcotics for pain, before, during or after your surgery, you may be constipated. Take a laxative if necessary.   MEDICATION: You should resume your pre-surgery medications unless told not to. In addition you will often be given an antibiotic to prevent infection. These should be taken as prescribed until the bottles are finished unless you are having an unusual reaction to one of the drugs.  PROBLEMS YOU SHOULD REPORT TO [place]:  Fevers over 100.5 Fahrenheit.  Heavy bleeding, or clots ( See above notes about blood in urine ).  Inability to urinate.  Drug reactions ( hives, rash, nausea, vomiting, diarrhea ).  Severe burning or pain with urination that is not improving.  FOLLOW-UP: You will need a follow-up appointment to monitor your progress. Call for this appointment at the number listed above. Usually the first appointment will be about three to fourteen days after your surgery.    Post Anesthesia Home Care Instructions  Activity: Get plenty of rest for the  remainder of the day. A responsible adult should stay with you for 24 hours following the procedure.  For the next 24 hours, DO NOT: -Drive a car -Advertising copywriterperate machinery -Drink alcoholic beverages -Take any medication unless instructed by your physician -Make any legal decisions or sign important papers.  Meals: Start with liquid foods such as gelatin or soup. Progress to regular foods as tolerated. Avoid greasy, spicy, heavy foods. If nausea  and/or vomiting occur, drink only clear liquids until the nausea and/or vomiting subsides. Call your physician if vomiting continues.  Special Instructions/Symptoms: Your throat may feel dry or sore from the anesthesia or the breathing tube placed in your throat during surgery. If this causes discomfort, gargle with warm salt water. The discomfort should disappear within 24 hours.  If you had a scopolamine patch placed behind your ear for the management of post- operative nausea and/or vomiting:  1. The medication in the patch is effective for 72 hours, after which it should be removed.  Wrap patch in a tissue and discard in the trash. Wash hands thoroughly with soap and water. 2. You may remove the patch earlier than 72 hours if you experience unpleasant side effects which may include dry mouth, dizziness or visual disturbances. 3. Avoid touching the patch. Wash your hands with soap and water after contact with the patch.

## 2016-04-21 NOTE — Interval H&P Note (Signed)
History and Physical Interval Note:  She had the initial procedure and was found to have purulent urine in the right kidney but cultures were negative.  I was unable to fully evaluated the proximal ureter so she is back for repeat ureteroscopy.   04/21/2016 8:37 AM  Valerie [name]  has presented today for surgery, with the diagnosis of RIGHT HYDRONEPHROSIS   The various methods of treatment have been discussed with the patient and family. After consideration of risks, benefits and other options for treatment, the patient has consented to  Procedure(s): CYSTOSCOPY WITH RIGHT URETEROSCOPY (Right) as a surgical intervention .  The patient's history has been reviewed, patient examined, no change in status, stable for surgery.  I have reviewed the patient's chart and labs.  Questions were answered to the patient's satisfaction.     Tully Mcinturff J

## 2016-04-21 NOTE — Anesthesia Postprocedure Evaluation (Signed)
Anesthesia Post Note  Patient: Valerie [name]  Procedure(s) Performed: Procedure(s) (LRB): CYSTOSCOPY WITH RIGHT URETEROSCOPY; RIGHT STENT EXCHANGE (Right)  Patient location during evaluation: PACU Anesthesia Type: General Level of consciousness: awake and alert Pain management: pain level controlled Vital Signs Assessment: post-procedure vital signs reviewed and stable Respiratory status: spontaneous breathing, nonlabored ventilation, respiratory function stable and patient connected to nasal cannula oxygen Cardiovascular status: blood pressure returned to baseline and stable Postop Assessment: no signs of nausea or vomiting Anesthetic complications: no    Last Vitals:  Filed Vitals:   04/21/16 1015 04/21/16 1030  BP: 157/86 150/78  Pulse: 80 80  Temp:    Resp: 24 18    Last Pain:  Filed Vitals:   04/21/16 1103  PainSc: 4                  Cordero Surette L

## 2016-04-22 ENCOUNTER — Encounter (HOSPITAL_BASED_OUTPATIENT_CLINIC_OR_DEPARTMENT_OTHER): Payer: Self-pay | Admitting: Urology

## 2016-04-22 NOTE — Op Note (Signed)
NAMVanessa [place]:  Teodoro, [place]               ACCOUNT NO.:  [id number]  MEDICAL RECORD NO.:  [id number]  LOCATION:                                FACILITY:  [facility]  PHYSICIAN:  Excell SeltzerJohn J. Annabell HowellsWrenn, M.D.    DATE OF BIRTH:  08-26-1946  DATE OF PROCEDURE:  04/21/2016 DATE OF DISCHARGE:  04/21/2016                              OPERATIVE REPORT   PROCEDURE: 1. Cystoscopy with removal of right double-J stent. 2. Right ureteroscopy with renal pelvic washings and biopsy of right     ureteral lesion. 3. Insertion of right double-J stent.  PREOPERATIVE DIAGNOSIS:  Right hydronephrosis with renal inflammation.  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  SURGEON:  Excell SeltzerJohn J. Annabell HowellsWrenn, M.D.  ANESTHESIA:  General.  SPECIMEN:  Washings from the right renal pelvis to be sent for culture cytology and Gram stains and biopsy of material from the right proximal ureter, possible fungal elements.  DRAINS:  A 6-French x 24 cm double-J stent.  BLOOD LOSS:  Minimal.  COMPLICATIONS:  None.  INDICATIONS:  Ms. Lindie SpruceWyatt is a 70 year old, white female, who was originally seen with right hydronephrosis of uncertain etiology.  She had previously undergone retrograde pyelography and ureteroscopy, but I was unable ascertain the nature of the obstruction because of significant turbidity and urine and some hemorrhage.  She has had a stent in for approximately 2 weeks and returns now for repeat ureteroscopy.  FINDINGS OF PROCEDURE:  She was given Ancef, she was taken to the operating room where general anesthetic was induced.  She was placed in lithotomy position.  Her perineum and genitalia were prepped with Betadine solution, and she was draped in usual sterile fashion.  Cystoscopy was performed using a 23-French scope and 30-degree lens. Examination revealed significantly turbid urine with a stent at the right ureteral orifice.  The bladder previously  had been described prior cystoscopy.  The stent was grasped with a grasping forceps and was removed.  I then inserted the BOA single-lumen digital flexible ureteroscope per urethra and was able to negotiate the right ureteral orifice without difficulty.  There were a few small clots in the ureter, and the proximal ureter, there was a fleshy appearing lesion on the lateral wall.  Inspection of internal collecting system revealed marked shaggy inflammatory changes in the renal pelvis with some debris in the calices that appeared suspicious for possible fungal elements.  After distention of renal pelvis for inspection, she began to have fairly significant bleeding which obscured vision.  At this point, I aspirated approximately 20 mL of fluid from the renal pelvis and this will be sent for culture cytology and Gram stain with attention to fungal elements.  I then backed the scope into the proximal ureter.  I used a 0 tip Nitinol basket to grasp the lesion on the lateral wall of the ureter, it came away easily suggesting a probable fungal elements or inflammatory material and not a true neoplastic lesion.  This was retrieved and will be sent to pathology for inspection, once again with attention for fungal elements.  At this point, the ureteroscope was removed and the cystoscope was reinserted.  The guidewire was passed to the kidney, and a fresh 6- French 24 cm Contour double-J stent was passed over the wire to the kidney under fluoroscopic guidance.  The wire was removed leaving good coil in the bladder and good coil in the kidney.  The bladder was drained.  The patient was taken down from lithotomy position.  Her anesthetic was reversed.  She was moved to recovery in stable condition. There were no complications.     Excell Seltzer. Annabell Howells, M.D.     JJW/MEDQ  D:  04/21/2016  T:  04/21/2016  Job:  [id number]

## 2016-04-24 LAB — BODY FLUID CULTURE

## 2016-05-06 ENCOUNTER — Ambulatory Visit (INDEPENDENT_AMBULATORY_CARE_PROVIDER_SITE_OTHER): Payer: Medicare Other | Admitting: Podiatry

## 2016-05-06 ENCOUNTER — Encounter: Payer: Self-pay | Admitting: Podiatry

## 2016-05-06 VITALS — BP 106/50 | HR 57 | Resp 16

## 2016-05-06 DIAGNOSIS — M2040 Other hammer toe(s) (acquired), unspecified foot: Secondary | ICD-10-CM

## 2016-05-06 DIAGNOSIS — M79676 Pain in unspecified toe(s): Secondary | ICD-10-CM

## 2016-05-06 DIAGNOSIS — B351 Tinea unguium: Secondary | ICD-10-CM | POA: Diagnosis not present

## 2016-05-06 DIAGNOSIS — L84 Corns and callosities: Secondary | ICD-10-CM | POA: Diagnosis not present

## 2016-05-06 NOTE — Progress Notes (Signed)
   Subjective:    Patient ID: Valerie JakschBeverly H Treiber, female    DOB: 02-05-1946, 70 y.o.   MRN: [id number]  HPI this patient presents to the office with chief complaint of long thick painful nails both feet. She says her big toenail, right foot is especially painful walking and wearing her shoes. She has also developed severe hammertoes on the second toes, which have formed callus at the site of the hammer toe. Patient is controlled type II diabetic with neuropathy. She presents the office through a referral by her medical doctor. She presents the office for an evaluation and treatment    Review of Systems  All other systems reviewed and are negative.      Objective:   Physical Exam GENERAL APPEARANCE: Alert, conversant. Appropriately groomed. No acute distress.  VASCULAR: Pedal pulses are not  palpable at  [facility] and PT bilateral.  Capillary refill time is immediate to all digits,  Normal temperature gradient.   NEUROLOGIC: sensation is diminished  to 5.07 monofilament at 5/5 sites bilateral.  Light touch is intact bilateral, Muscle strength normal.  MUSCULOSKELETAL: acceptable muscle strength, tone and stability bilateral.  Intrinsic muscluature intact bilateral.  Rectus appearance of foot and digits noted bilateral. Hammer toe deformity second toe B/L.  DERMATOLOGIC: skin color, texture, and turgor are within normal limits.  No preulcerative lesions or ulcers  are seen, no interdigital maceration noted.  No open lesions present.  . No drainage noted. Heloma durum second toes B/L.  NAILS  Thick disfigured discolored nails both feet.         Assessment & Plan:  Onychomycosis   Heloma durum second digit secondary to hammer toe B/L   IE  Debride nails B/L  Debride heloma durum B/L.  RTC 3 months   Helane GuntherGregory Jireh Vinas DPM

## 2016-06-02 ENCOUNTER — Other Ambulatory Visit: Payer: Self-pay | Admitting: *Deleted

## 2016-08-05 ENCOUNTER — Ambulatory Visit (INDEPENDENT_AMBULATORY_CARE_PROVIDER_SITE_OTHER): Payer: Medicare Other | Admitting: Podiatry

## 2016-08-05 ENCOUNTER — Encounter: Payer: Self-pay | Admitting: Podiatry

## 2016-08-05 DIAGNOSIS — B351 Tinea unguium: Secondary | ICD-10-CM | POA: Diagnosis not present

## 2016-08-05 DIAGNOSIS — M79676 Pain in unspecified toe(s): Secondary | ICD-10-CM

## 2016-08-05 NOTE — Progress Notes (Signed)
Complaint:  Visit Type: Patient returns to my office for continued preventative foot care services. Complaint: Patient states" my nails have grown long and thick and become painful to walk and wear shoes" Patient has been diagnosed with DM with neuropathy.. The patient presents for preventative foot care services. No changes to ROS  Podiatric Exam: Vascular: dorsalis pedis and posterior tibial pulses are palpable bilateral. Capillary return is immediate. Temperature gradient is WNL. Skin turgor WNL  Sensorium: Normal Semmes Weinstein monofilament test. Normal tactile sensation bilaterally. Nail Exam: Pt has thick disfigured discolored nails with subungual debris noted bilateral entire nail hallux through fifth toenails Ulcer Exam: There is no evidence of ulcer or pre-ulcerative changes or infection. Orthopedic Exam: Muscle tone and strength are WNL. No limitations in general ROM. No crepitus or effusions noted. Foot type and digits show no abnormalities. Bony prominences are unremarkable. Skin: No Porokeratosis. No infection or ulcers  Diagnosis:  Onychomycosis, , Pain in right toe, pain in left toes  Treatment & Plan Procedures and Treatment: Consent by patient was obtained for treatment procedures. The patient understood the discussion of treatment and procedures well. All questions were answered thoroughly reviewed. Debridement of mycotic and hypertrophic toenails, 1 through 5 bilateral and clearing of subungual debris. No ulceration, no infection noted.  Return Visit-Office Procedure: Patient instructed to return to the office for a follow up visit 3 months for continued evaluation and treatment.    Cheynne Virden DPM 

## 2016-08-08 ENCOUNTER — Other Ambulatory Visit: Payer: Self-pay | Admitting: Physical Medicine and Rehabilitation

## 2016-08-08 DIAGNOSIS — M545 Low back pain: Secondary | ICD-10-CM

## 2016-08-08 DIAGNOSIS — M47816 Spondylosis without myelopathy or radiculopathy, lumbar region: Secondary | ICD-10-CM

## 2016-08-15 ENCOUNTER — Ambulatory Visit
Admission: RE | Admit: 2016-08-15 | Discharge: 2016-08-15 | Disposition: A | Discharge status: Home / Self Care | Payer: Medicare Other | Source: Ambulatory Visit | Attending: Physical Medicine and Rehabilitation | Admitting: Physical Medicine and Rehabilitation

## 2016-08-15 DIAGNOSIS — M47816 Spondylosis without myelopathy or radiculopathy, lumbar region: Secondary | ICD-10-CM

## 2016-08-15 DIAGNOSIS — M545 Low back pain: Secondary | ICD-10-CM

## 2016-09-21 ENCOUNTER — Encounter (HOSPITAL_BASED_OUTPATIENT_CLINIC_OR_DEPARTMENT_OTHER): Payer: Medicare Other | Attending: Internal Medicine

## 2016-09-21 DIAGNOSIS — I129 Hypertensive chronic kidney disease with stage 1 through stage 4 chronic kidney disease, or unspecified chronic kidney disease: Secondary | ICD-10-CM | POA: Insufficient documentation

## 2016-09-21 DIAGNOSIS — E1122 Type 2 diabetes mellitus with diabetic chronic kidney disease: Secondary | ICD-10-CM | POA: Diagnosis not present

## 2016-09-21 DIAGNOSIS — Y838 Other surgical procedures as the cause of abnormal reaction of the patient, or of later complication, without mention of misadventure at the time of the procedure: Secondary | ICD-10-CM | POA: Insufficient documentation

## 2016-09-21 DIAGNOSIS — E1136 Type 2 diabetes mellitus with diabetic cataract: Secondary | ICD-10-CM | POA: Diagnosis not present

## 2016-09-21 DIAGNOSIS — L89323 Pressure ulcer of left buttock, stage 3: Secondary | ICD-10-CM | POA: Diagnosis not present

## 2016-09-21 DIAGNOSIS — Z794 Long term (current) use of insulin: Secondary | ICD-10-CM | POA: Diagnosis not present

## 2016-09-21 DIAGNOSIS — E1140 Type 2 diabetes mellitus with diabetic neuropathy, unspecified: Secondary | ICD-10-CM | POA: Diagnosis not present

## 2016-09-21 DIAGNOSIS — T8189XA Other complications of procedures, not elsewhere classified, initial encounter: Secondary | ICD-10-CM | POA: Insufficient documentation

## 2016-09-21 DIAGNOSIS — N183 Chronic kidney disease, stage 3 (moderate): Secondary | ICD-10-CM | POA: Insufficient documentation

## 2016-09-21 DIAGNOSIS — M479 Spondylosis, unspecified: Secondary | ICD-10-CM | POA: Insufficient documentation

## 2016-09-21 DIAGNOSIS — L89313 Pressure ulcer of right buttock, stage 3: Secondary | ICD-10-CM | POA: Diagnosis not present

## 2016-09-28 ENCOUNTER — Encounter (HOSPITAL_BASED_OUTPATIENT_CLINIC_OR_DEPARTMENT_OTHER): Payer: Medicare Other | Attending: Internal Medicine

## 2016-09-28 DIAGNOSIS — E1140 Type 2 diabetes mellitus with diabetic neuropathy, unspecified: Secondary | ICD-10-CM | POA: Diagnosis not present

## 2016-09-28 DIAGNOSIS — E669 Obesity, unspecified: Secondary | ICD-10-CM | POA: Insufficient documentation

## 2016-09-28 DIAGNOSIS — Z6841 Body Mass Index (BMI) 40.0 and over, adult: Secondary | ICD-10-CM | POA: Insufficient documentation

## 2016-09-28 DIAGNOSIS — T8189XA Other complications of procedures, not elsewhere classified, initial encounter: Secondary | ICD-10-CM | POA: Insufficient documentation

## 2016-09-28 DIAGNOSIS — L89322 Pressure ulcer of left buttock, stage 2: Secondary | ICD-10-CM | POA: Diagnosis not present

## 2016-09-28 DIAGNOSIS — Y838 Other surgical procedures as the cause of abnormal reaction of the patient, or of later complication, without mention of misadventure at the time of the procedure: Secondary | ICD-10-CM | POA: Insufficient documentation

## 2016-09-28 DIAGNOSIS — L89312 Pressure ulcer of right buttock, stage 2: Secondary | ICD-10-CM | POA: Diagnosis not present

## 2016-09-28 DIAGNOSIS — I10 Essential (primary) hypertension: Secondary | ICD-10-CM | POA: Insufficient documentation

## 2016-10-05 DIAGNOSIS — T8189XA Other complications of procedures, not elsewhere classified, initial encounter: Secondary | ICD-10-CM | POA: Diagnosis not present

## 2016-10-19 DIAGNOSIS — T8189XA Other complications of procedures, not elsewhere classified, initial encounter: Secondary | ICD-10-CM | POA: Diagnosis not present

## 2016-10-28 ENCOUNTER — Ambulatory Visit (INDEPENDENT_AMBULATORY_CARE_PROVIDER_SITE_OTHER): Payer: Medicare Other | Admitting: Podiatry

## 2016-10-28 ENCOUNTER — Encounter: Payer: Self-pay | Admitting: Podiatry

## 2016-10-28 VITALS — Resp 16 | Ht 63.0 in | Wt 291.0 lb

## 2016-10-28 DIAGNOSIS — B351 Tinea unguium: Secondary | ICD-10-CM | POA: Diagnosis not present

## 2016-10-28 DIAGNOSIS — M2040 Other hammer toe(s) (acquired), unspecified foot: Secondary | ICD-10-CM

## 2016-10-28 DIAGNOSIS — M79676 Pain in unspecified toe(s): Secondary | ICD-10-CM | POA: Diagnosis not present

## 2016-10-28 MED ORDER — CLOTRIMAZOLE-BETAMETHASONE 1-0.05 % EX CREA: 1.0000 "application " | TOPICAL_CREAM | Freq: Two times a day (BID) | CUTANEOUS | 0 refills | Status: DC

## 2016-10-28 NOTE — Progress Notes (Signed)
Complaint:  Visit Type: Patient returns to my office for continued preventative foot care services. Complaint: Patient states" my nails have grown long and thick and become painful to walk and wear shoes" Patient has been diagnosed with DM with neuropathy.. The patient presents for preventative foot care services. No changes to ROS  Podiatric Exam: Vascular: dorsalis pedis and posterior tibial pulses are palpable bilateral. Capillary return is immediate. Temperature gradient is WNL. Skin turgor WNL  Sensorium: Normal Semmes Weinstein monofilament test. Normal tactile sensation bilaterally. Nail Exam: Pt has thick disfigured discolored nails with subungual debris noted bilateral entire nail hallux through fifth toenails Ulcer Exam: There is no evidence of ulcer or pre-ulcerative changes or infection. Orthopedic Exam: Muscle tone and strength are WNL. No limitations in general ROM. No crepitus or effusions noted. Foot type and digits show no abnormalities. Bony prominences are unremarkable. Skin: No Porokeratosis. No infection or ulcers.  Large circular dermatitis medial malleolus right foot.  No signs of redness or infection. Diagnosis:  Onychomycosis, , Pain in right toe, pain in left toes  Treatment & Plan Procedures and Treatment: Consent by patient was obtained for treatment procedures. The patient understood the discussion of treatment and procedures well. All questions were answered thoroughly reviewed. Debridement of mycotic and hypertrophic toenails, 1 through 5 bilateral and clearing of subungual debris. No ulceration, no infection noted.  Return Visit-Office Procedure: Patient instructed to return to the office for a follow up visit 3 months for continued evaluation and treatment.   Helane GuntherGregory Laiken Sandy DPM    Helane GuntherGregory Shantee Hayne DPM

## 2016-10-30 IMAGING — CT CT CHEST W/O CM
2 of 4 series · 15 of 36 positions shown, 18 images · non-contrast
Comparison: Chest x-ray 01/20/2016, 01/06/2016 as well as abdominal
pelvic CT 01/14/2016, 01/06/2016 and 05/23/2013

CLINICAL DATA: Dyspnea.  Follow-up for pneumonia.

EXAM:
CT CHEST WITHOUT CONTRAST
TECHNIQUE: Multidetector CT imaging of the chest was performed following the
standard protocol without IV contrast.

[Series 2: chest w/o st · axial · non-contrast · 0.67mm/px · z∈[+1049,+1254]mm · 12 of 49 slices shown, 15 images]
[im 4/49  mediastinal]
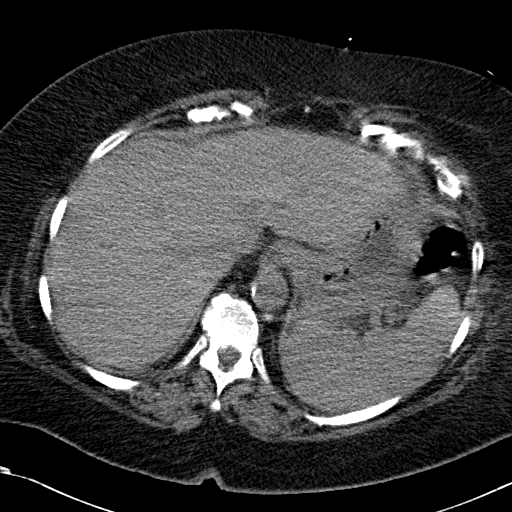
[im 4/49  lung]
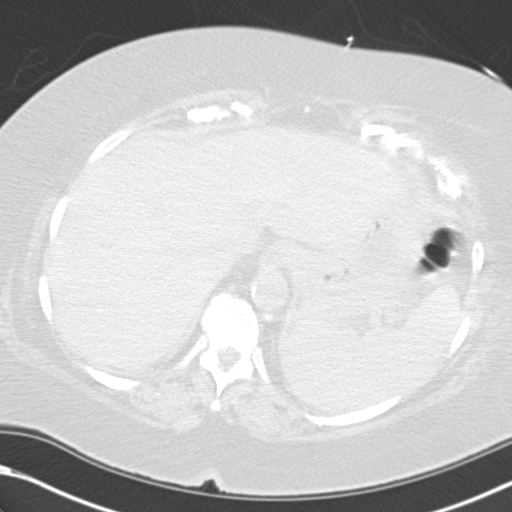
[im 8/49  lung]
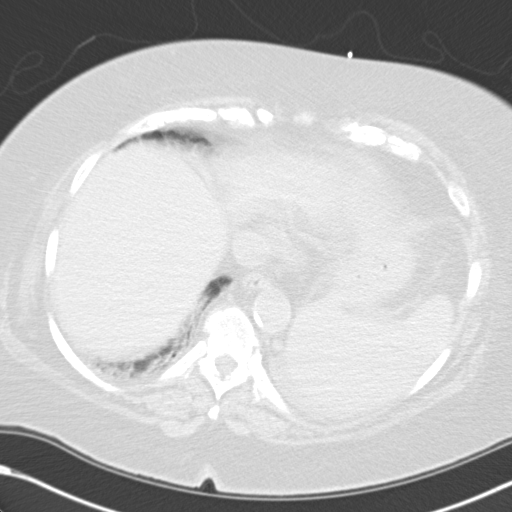
[im 12/49  lung]
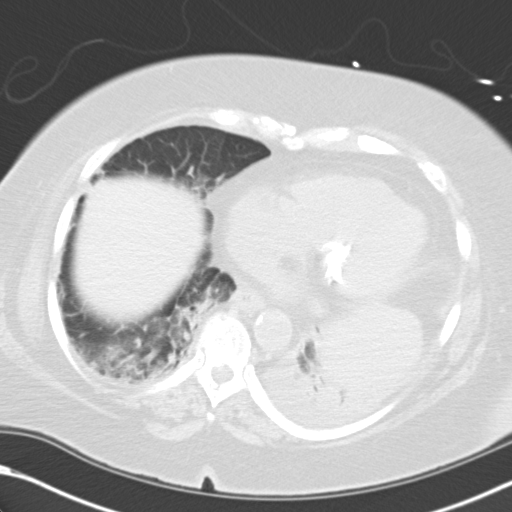
[im 15/49  lung]
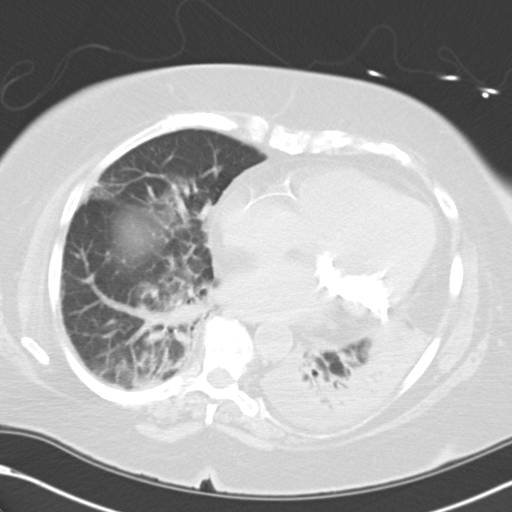
[im 19/49  mediastinal]
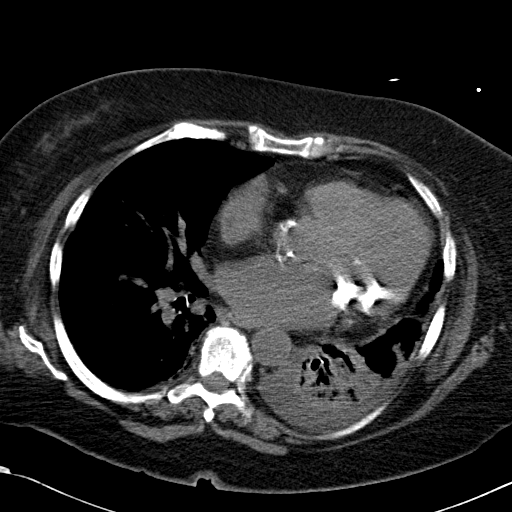
[im 19/49  lung]
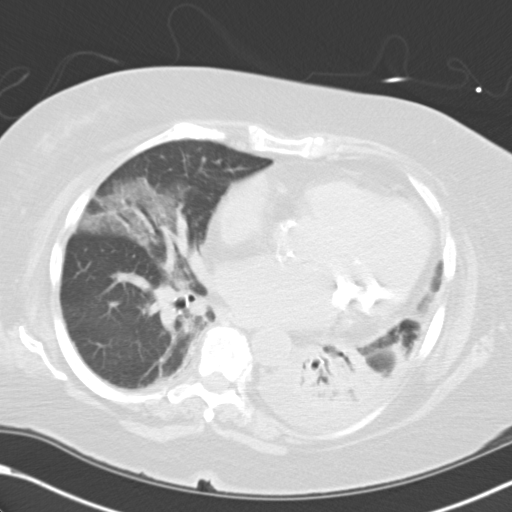
[im 23/49  lung]
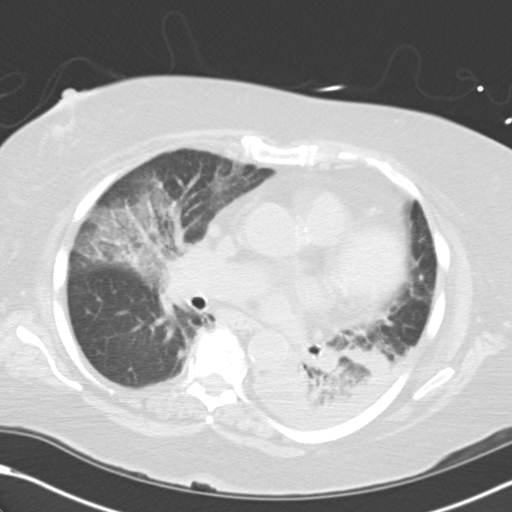
[im 26/49  lung]
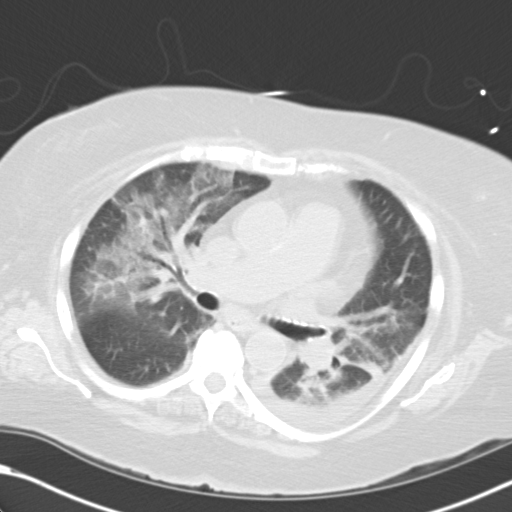
[im 30/49  lung]
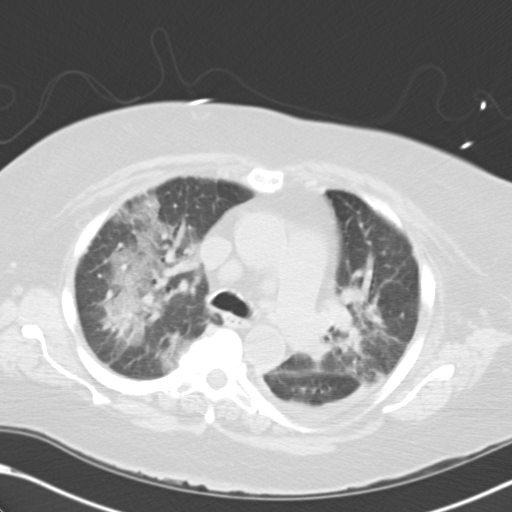
[im 34/49  mediastinal]
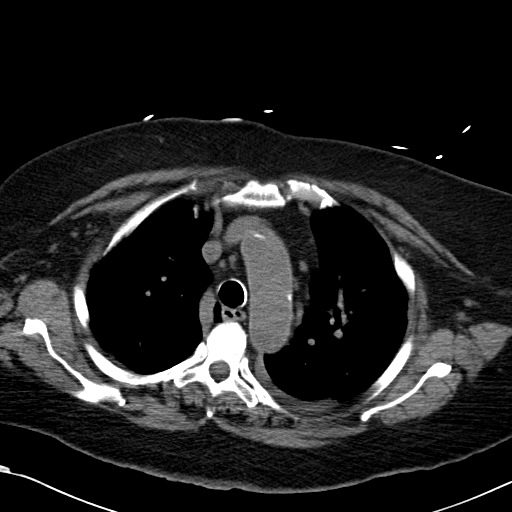
[im 34/49  lung]
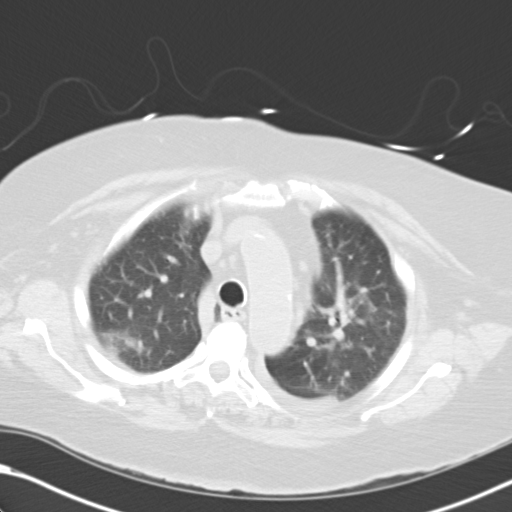
[im 37/49  lung]
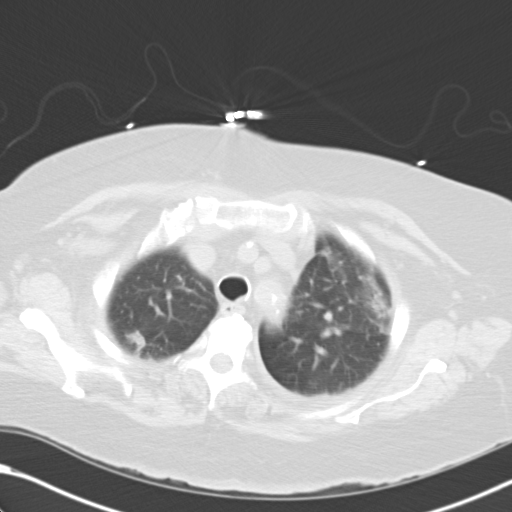
[im 41/49  lung]
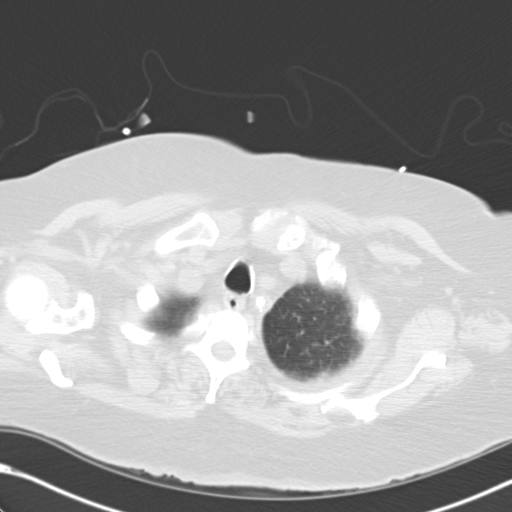
[im 45/49  lung]
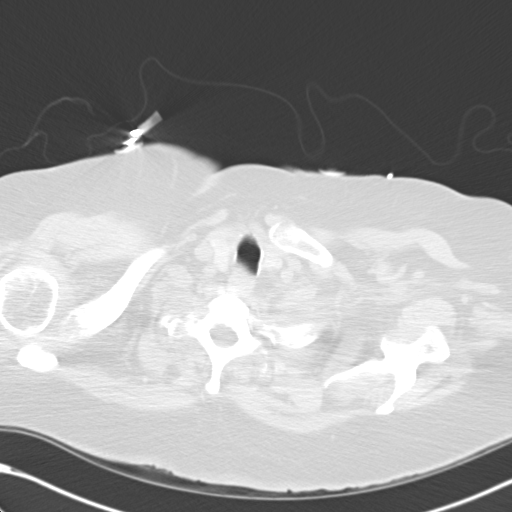

[Series 602: coronal · coronal · 0.67mm/px · 3 of 106 slices shown]
[im 22/106  lung]
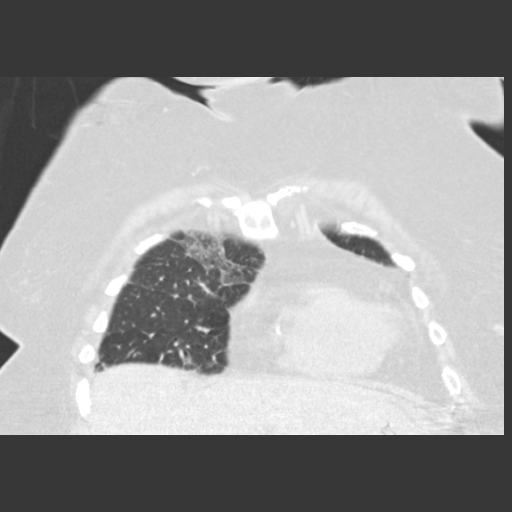
[im 43/106  lung]
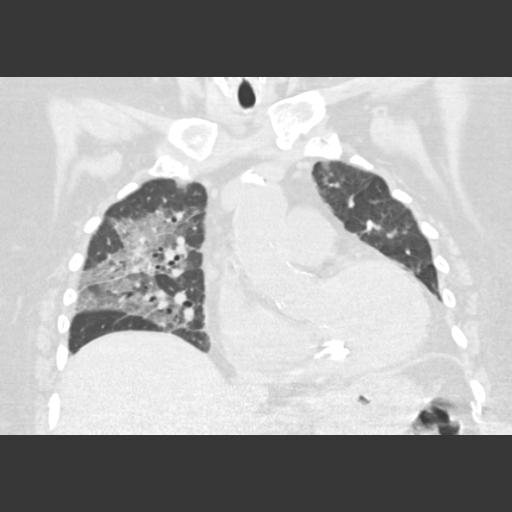
[im 64/106  lung]
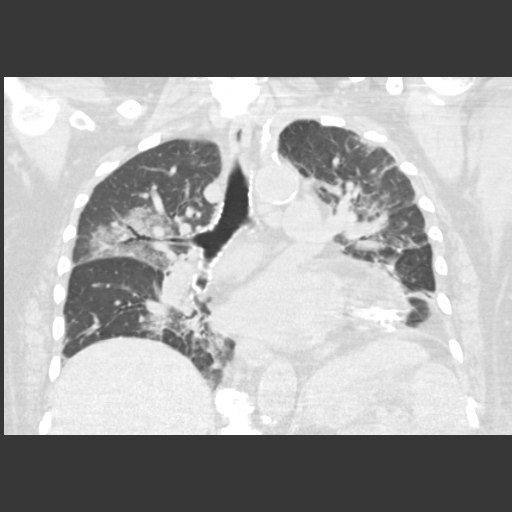

[15 of 36 positions shown; findings below may reference images not displayed]

FINDINGS: Lungs are adequately inflated demonstrate patchy bilateral airspace
process most prominent over the right upper lobe and left lower
lobe. Small left pleural effusion. Right mid to upper lung airspace
disease is new compared to the prior chest x-ray 01/06/2016.
Consolidation in the lung bases is not significantly changed
compared to CT 01/14/2016, although the small left pleural effusion
is new. Mild bibasilar bronchiectatic change. Small amount of likely
aspirated material along the left posterior aspect of the mid to
distal trachea.

There is mild stable cardiomegaly with severe calcification over the
mitral valve annulus. Calcified plaque right coronary artery.
Calcified plaque over the thoracic aorta. 1.4 cm precarinal lymph
node likely reactive. Few other smaller mediastinal lymph nodes.
Remaining mediastinal structures are within normal. 8-9 mm hypodense
nodule over the right lobe of the thyroid likely benign.

Images over the upper abdomen are unremarkable. Mild degenerate
change of the spine.
IMPRESSION: Multifocal airspace process worse over the right upper lobe and left
base likely multifocal pneumonia and likely secondary to aspiration
with small amount of aspirate material noted within the left
posterior aspect of the mid to distal trachea. Small left pleural
effusion. Minimal reactive mediastinal adenopathy.

Stable cardiomegaly with severe calcification of the mitral valve
annulus. Atherosclerotic disease of the right coronary artery

8-9 mm hypodense right thyroid nodule.

## 2016-11-02 ENCOUNTER — Encounter (HOSPITAL_BASED_OUTPATIENT_CLINIC_OR_DEPARTMENT_OTHER): Payer: Medicare Other | Attending: Internal Medicine

## 2016-11-02 DIAGNOSIS — E119 Type 2 diabetes mellitus without complications: Secondary | ICD-10-CM | POA: Insufficient documentation

## 2016-11-02 DIAGNOSIS — E669 Obesity, unspecified: Secondary | ICD-10-CM | POA: Insufficient documentation

## 2016-11-02 DIAGNOSIS — Y838 Other surgical procedures as the cause of abnormal reaction of the patient, or of later complication, without mention of misadventure at the time of the procedure: Secondary | ICD-10-CM | POA: Insufficient documentation

## 2016-11-02 DIAGNOSIS — S81812A Laceration without foreign body, left lower leg, initial encounter: Secondary | ICD-10-CM | POA: Diagnosis not present

## 2016-11-02 DIAGNOSIS — L89312 Pressure ulcer of right buttock, stage 2: Secondary | ICD-10-CM | POA: Diagnosis not present

## 2016-11-02 DIAGNOSIS — Z6841 Body Mass Index (BMI) 40.0 and over, adult: Secondary | ICD-10-CM | POA: Diagnosis not present

## 2016-11-02 DIAGNOSIS — X58XXXA Exposure to other specified factors, initial encounter: Secondary | ICD-10-CM | POA: Insufficient documentation

## 2016-11-02 DIAGNOSIS — T8189XA Other complications of procedures, not elsewhere classified, initial encounter: Secondary | ICD-10-CM | POA: Insufficient documentation

## 2016-11-02 DIAGNOSIS — I10 Essential (primary) hypertension: Secondary | ICD-10-CM | POA: Insufficient documentation

## 2016-11-26 DIAGNOSIS — T8189XA Other complications of procedures, not elsewhere classified, initial encounter: Secondary | ICD-10-CM | POA: Diagnosis not present

## 2016-12-11 ENCOUNTER — Other Ambulatory Visit: Payer: Self-pay | Admitting: Family Medicine

## 2016-12-11 DIAGNOSIS — Z1231 Encounter for screening mammogram for malignant neoplasm of breast: Secondary | ICD-10-CM

## 2016-12-31 ENCOUNTER — Encounter (HOSPITAL_BASED_OUTPATIENT_CLINIC_OR_DEPARTMENT_OTHER): Payer: Medicare Other | Attending: Internal Medicine

## 2016-12-31 DIAGNOSIS — Y838 Other surgical procedures as the cause of abnormal reaction of the patient, or of later complication, without mention of misadventure at the time of the procedure: Secondary | ICD-10-CM | POA: Diagnosis not present

## 2016-12-31 DIAGNOSIS — T8189XA Other complications of procedures, not elsewhere classified, initial encounter: Secondary | ICD-10-CM | POA: Insufficient documentation

## 2016-12-31 DIAGNOSIS — E1136 Type 2 diabetes mellitus with diabetic cataract: Secondary | ICD-10-CM | POA: Insufficient documentation

## 2016-12-31 DIAGNOSIS — I10 Essential (primary) hypertension: Secondary | ICD-10-CM | POA: Insufficient documentation

## 2016-12-31 DIAGNOSIS — E1140 Type 2 diabetes mellitus with diabetic neuropathy, unspecified: Secondary | ICD-10-CM | POA: Insufficient documentation

## 2017-01-18 DIAGNOSIS — T8189XA Other complications of procedures, not elsewhere classified, initial encounter: Secondary | ICD-10-CM | POA: Diagnosis not present

## 2017-01-27 ENCOUNTER — Ambulatory Visit: Payer: Medicare Other | Admitting: Podiatry

## 2017-02-01 ENCOUNTER — Encounter (HOSPITAL_BASED_OUTPATIENT_CLINIC_OR_DEPARTMENT_OTHER): Payer: Medicare Other | Attending: Internal Medicine

## 2017-02-01 DIAGNOSIS — T8189XA Other complications of procedures, not elsewhere classified, initial encounter: Secondary | ICD-10-CM | POA: Diagnosis not present

## 2017-02-01 DIAGNOSIS — E1140 Type 2 diabetes mellitus with diabetic neuropathy, unspecified: Secondary | ICD-10-CM | POA: Diagnosis not present

## 2017-02-01 DIAGNOSIS — Y838 Other surgical procedures as the cause of abnormal reaction of the patient, or of later complication, without mention of misadventure at the time of the procedure: Secondary | ICD-10-CM | POA: Diagnosis not present

## 2017-02-01 DIAGNOSIS — I10 Essential (primary) hypertension: Secondary | ICD-10-CM | POA: Insufficient documentation

## 2017-02-08 DIAGNOSIS — T8189XA Other complications of procedures, not elsewhere classified, initial encounter: Secondary | ICD-10-CM | POA: Diagnosis not present

## 2017-02-09 ENCOUNTER — Other Ambulatory Visit: Payer: Self-pay | Admitting: Internal Medicine

## 2017-02-11 ENCOUNTER — Encounter: Payer: Self-pay | Admitting: Podiatry

## 2017-02-11 ENCOUNTER — Ambulatory Visit (INDEPENDENT_AMBULATORY_CARE_PROVIDER_SITE_OTHER): Payer: Medicare Other | Admitting: Podiatry

## 2017-02-11 VITALS — Ht 63.0 in | Wt 291.0 lb

## 2017-02-11 DIAGNOSIS — M2040 Other hammer toe(s) (acquired), unspecified foot: Secondary | ICD-10-CM

## 2017-02-11 DIAGNOSIS — M79676 Pain in unspecified toe(s): Secondary | ICD-10-CM

## 2017-02-11 DIAGNOSIS — B351 Tinea unguium: Secondary | ICD-10-CM

## 2017-02-11 NOTE — Progress Notes (Signed)
Complaint:  Visit Type: Patient returns to my office for continued preventative foot care services. Complaint: Patient states" my nails have grown long and thick and become painful to walk and wear shoes" Patient has been diagnosed with DM with neuropathy.. The patient presents for preventative foot care services. No changes to ROS  Podiatric Exam: Vascular: dorsalis pedis and posterior tibial pulses are palpable bilateral. Capillary return is immediate. Temperature gradient is WNL. Skin turgor WNL  Sensorium: Normal Semmes Weinstein monofilament test. Normal tactile sensation bilaterally. Nail Exam: Pt has thick disfigured discolored nails with subungual debris noted bilateral entire nail hallux through fifth toenails Ulcer Exam: There is no evidence of ulcer or pre-ulcerative changes or infection. Orthopedic Exam: Muscle tone and strength are WNL. No limitations in general ROM. No crepitus or effusions noted. Foot type and digits show no abnormalities. Bony prominences are unremarkable. Skin: No Porokeratosis. No infection or ulcers.  Large circular dermatitis medial malleolus right foot.  No signs of redness or infection. Diagnosis:  Onychomycosis, , Pain in right toe, pain in left toes  Treatment & Plan Procedures and Treatment: Consent by patient was obtained for treatment procedures. The patient understood the discussion of treatment and procedures well. All questions were answered thoroughly reviewed. Debridement of mycotic and hypertrophic toenails, 1 through 5 bilateral and clearing of subungual debris. No ulceration, no infection noted. Padding applied second toes  B/L Return Visit-Office Procedure: Patient instructed to return to the office for a follow up visit 3 months for continued evaluation and treatment.   Helane GuntherGregory Braelen Sproule DPM

## 2017-02-15 DIAGNOSIS — T8189XA Other complications of procedures, not elsewhere classified, initial encounter: Secondary | ICD-10-CM | POA: Diagnosis not present

## 2017-02-16 ENCOUNTER — Telehealth: Payer: Self-pay | Admitting: *Deleted

## 2017-02-16 NOTE — Telephone Encounter (Signed)
Pt asked if she could get more of the toe pockets Dr.Mayer gave for her hammer toe. 02/16/2017-I told pt she could purchase the toe pocket from Dr. Aram BeechamMayer's office.

## 2017-03-01 ENCOUNTER — Encounter (HOSPITAL_BASED_OUTPATIENT_CLINIC_OR_DEPARTMENT_OTHER): Payer: Medicare Other | Attending: Internal Medicine

## 2017-03-01 DIAGNOSIS — Y838 Other surgical procedures as the cause of abnormal reaction of the patient, or of later complication, without mention of misadventure at the time of the procedure: Secondary | ICD-10-CM | POA: Insufficient documentation

## 2017-03-01 DIAGNOSIS — E1140 Type 2 diabetes mellitus with diabetic neuropathy, unspecified: Secondary | ICD-10-CM | POA: Insufficient documentation

## 2017-03-01 DIAGNOSIS — I10 Essential (primary) hypertension: Secondary | ICD-10-CM | POA: Insufficient documentation

## 2017-03-01 DIAGNOSIS — T8189XA Other complications of procedures, not elsewhere classified, initial encounter: Secondary | ICD-10-CM | POA: Insufficient documentation

## 2017-03-08 DIAGNOSIS — T8189XA Other complications of procedures, not elsewhere classified, initial encounter: Secondary | ICD-10-CM | POA: Diagnosis present

## 2017-03-08 DIAGNOSIS — I10 Essential (primary) hypertension: Secondary | ICD-10-CM | POA: Diagnosis not present

## 2017-03-08 DIAGNOSIS — Y838 Other surgical procedures as the cause of abnormal reaction of the patient, or of later complication, without mention of misadventure at the time of the procedure: Secondary | ICD-10-CM | POA: Diagnosis not present

## 2017-03-08 DIAGNOSIS — E1140 Type 2 diabetes mellitus with diabetic neuropathy, unspecified: Secondary | ICD-10-CM | POA: Diagnosis not present

## 2017-03-22 DIAGNOSIS — T8189XA Other complications of procedures, not elsewhere classified, initial encounter: Secondary | ICD-10-CM | POA: Diagnosis not present

## 2017-04-05 ENCOUNTER — Encounter (HOSPITAL_BASED_OUTPATIENT_CLINIC_OR_DEPARTMENT_OTHER): Payer: Medicare Other | Attending: Internal Medicine

## 2017-04-05 DIAGNOSIS — Y838 Other surgical procedures as the cause of abnormal reaction of the patient, or of later complication, without mention of misadventure at the time of the procedure: Secondary | ICD-10-CM | POA: Insufficient documentation

## 2017-04-05 DIAGNOSIS — T8189XA Other complications of procedures, not elsewhere classified, initial encounter: Secondary | ICD-10-CM | POA: Insufficient documentation

## 2017-04-05 DIAGNOSIS — E1140 Type 2 diabetes mellitus with diabetic neuropathy, unspecified: Secondary | ICD-10-CM | POA: Insufficient documentation

## 2017-04-05 DIAGNOSIS — I10 Essential (primary) hypertension: Secondary | ICD-10-CM | POA: Insufficient documentation

## 2017-04-07 ENCOUNTER — Other Ambulatory Visit: Payer: Self-pay | Admitting: Urology

## 2017-04-12 ENCOUNTER — Encounter (HOSPITAL_BASED_OUTPATIENT_CLINIC_OR_DEPARTMENT_OTHER): Payer: Self-pay | Admitting: *Deleted

## 2017-04-12 NOTE — Progress Notes (Signed)
NPO AFTER MN.  ARRIVE AT 1610.  NEEDS ISTAT 8 AND EKG.  WILL TAKE PROTONIX , MRYBETRIQ, NORVASC, OPANA ER, AND OPANA AM DOS W/ SIPS OF WATER.

## 2017-04-14 NOTE — H&P (Signed)
CC/HPI: I have blood in my urine.     Valerie [name] returns today with her CT results. She had recent gross hematuria with known chronic right renal inflammation with hydro. Her culture was negative. She continues to have brown urine and passes some flecks of material. She has some right back pain but has spinal issues as well. Her CT shows mild right hydro with chronic enhancement of the proximal ureter and pelvis. There is free floating debris in the pelvis as well. She has no associated signs or symptoms. Her Cr was 1.4.     ALLERGIES: No Allergies    MEDICATIONS: Aleve 1 PO Daily PRN  Allegra 60 mg capsule Oral  Alprazolam 0.25 mg tablet Oral  Amlodipine Besylate 10 mg tablet Oral  Celecoxib  Escitalopram Oxalate 10 mg tablet  Fenofibrate 150 mg capsule Oral  Fish Oil CAPS Oral  Flonase 50 mcg/actuation spray, suspension Nasal  Furosemide 20 mg tablet Oral  Humulin R 100 unit/ml vial Injection  Hydrochlorothiazide 12.5 mg tablet Oral  Kayexalate powder Oral  Lipitor 10 mg tablet Oral  Mucinex 600 mg tablet, extended release 12 hr Oral  Myrbetriq 50 mg tablet, extended release 24 hr Oral  Ondansetron Odt 4 mg tablet,disintegrating Oral  Oxymorphone Hcl 10 mg tablet  Pantoprazole Sodium 40 mg tablet, delayed release Oral  Tizanidine Hcl 2 mg tablet Oral  Tylenol 325 mg tablet Oral     GU PSH: Cysto Uretero Biopsy Fulgura - 04/24/2016 Cystoscopy Insert Stent - 04/24/2016, 04/16/2016 Cystoscopy Ureteroscopy - 04/16/2016 Locm 300-399Mg /Ml Iodine,1Ml - 04/02/2017 Rpr Umbil Hern; Reduc < 5 Yr - 09-Apr-2016      PSH Notes: Epidural injections 2017   NON-GU PSH: Laparoscopy, Surgical; Repair Umbilical Hernia Pancreas Surgery (Unspecified) - 04-09-2016 Small bowel resection - 04/09/16    GU PMH: Gross hematuria (Acute), Culture urine. Will continue with daily suppression Cephalexin until culture finalized. If she has UTI now resistant to Cephalexin will change suppression. Will proceed  with hematuria workup with CT and possible cysto - 04/01/2017, Gross hematuria, - 04-09-2016 Isolated proteinuria (Acute), With new onset Proteinuria in pt with diabetes and hypertension may need nephrology referral. Will check BUN/creatine today - 04/01/2017 Hydronephrosis Unspec (Stable), Right - 08/24/2016, (Improving), There is minimal residual right hydro noted on [place]. , - 07/03/2016, Hydronephrosis, right, - 05/27/2016 Recurrent Cystitis w/o hematuria (Stable), Her UA looks infected but she just came of our antibiotic and is on amoxicillin. She is free of symptoms. - 07/03/2016, Chronic cystitis, - 05/27/2016 Urinary Tract Inf, Unspec site, Pyuria - 04/27/2016 Incomplete bladder emptying, Feeling of incomplete bladder emptying - 2016-04-09 Postmenopausal atrophic vaginitis, Atrophic vaginitis - 04-09-2016    NON-GU PMH: Anxiety, Anxiety - 2016-04-09 Encounter for general adult medical examination without abnormal findings, Encounter for preventive health examination - 04/09/16 Personal history of other diseases of the circulatory system, History of hypertension - 2016-04-09 Personal history of other diseases of the digestive system, History of small bowel obstruction - 04/09/16, History of pancreatitis, - Apr 09, 2016, History of esophageal reflux, - 09-Apr-2016 Personal history of other diseases of the musculoskeletal system and connective tissue, History of arthritis - 2016-04-09 Personal history of other endocrine, nutritional and metabolic disease, History of hypercholesterolemia - 09-Apr-2016, History of diabetes mellitus, - 2016-04-09    FAMILY HISTORY: Death of family member - Runs In Family Prostate Cancer - Runs In Family   SOCIAL HISTORY: Marital Status: Married Current Smoking Status: Patient does not smoke anymore.  Has never drank.  Drinks 1 caffeinated drink  per day.    REVIEW OF SYSTEMS:    GU Review Female:   Patient denies frequent urination, hard to postpone urination, burning /pain with  urination, get up at night to urinate, leakage of urine, stream starts and stops, trouble starting your stream, have to strain to urinate, and currently pregnant.  Gastrointestinal (Upper):   Patient denies nausea, vomiting, and indigestion/ heartburn.  Gastrointestinal (Lower):   Patient denies diarrhea and constipation.  Constitutional:   Patient denies night sweats, fever, fatigue, and weight loss.  Skin:   Patient denies skin rash/ lesion and itching.  Eyes:   Patient denies blurred vision and double vision.  Ears/ Nose/ Throat:   Patient denies sore throat and sinus problems.  Hematologic/Lymphatic:   Patient denies swollen glands and easy bruising.  Cardiovascular:   Patient denies leg swelling and chest pains.  Respiratory:   Patient reports shortness of breath. Patient denies cough.  Endocrine:   Patient denies excessive thirst.  Musculoskeletal:   Patient reports back pain. Patient denies joint pain.  Neurological:   Patient reports dizziness. Patient denies headaches.  Psychologic:   Patient denies depression and anxiety.   VITAL SIGNS: None   MULTI-SYSTEM PHYSICAL EXAMINATION:    Constitutional: Obese. No physical deformities. Normally developed. Good grooming.   Respiratory: No labored breathing, no use of accessory muscles. CTA  Cardiovascular: Normal temperature, RRR without murmur  Gastrointestinal: Obese abdomen. No mass, no tenderness, no rigidity.      PAST DATA REVIEWED:  Source Of History:  Patient  Urine Test Review:   Urinalysis, Urine Culture  X-Ray Review: C.T. Hematuria: Reviewed Films. Reviewed Report. Discussed With Patient.     PROCEDURES:          Urinalysis w/Scope Dipstick Dipstick Cont'd Micro  Color: Amber Bilirubin: Neg WBC/hpf: >60/hpf  Appearance: Turbid Ketones: Neg RBC/hpf: 40 - 60/hpf  Specific Gravity: 1.025 Blood: 3+ Bacteria: Rare (0-9/hpf)  pH: 6.0 Protein: 2+ Cystals: NS (Not Seen)  Glucose: Neg Urobilinogen: 0.2 Casts: NS (Not Seen)     Nitrites: Neg Trichomonas: Not Present    Leukocyte Esterase: 3+ Mucous: Not Present      Epithelial Cells: 0 - 5/hpf      Yeast: NS (Not Seen)      Sperm: Not Present    Notes: unconcentrated microscopic    ASSESSMENT:      ICD-10 Details  1 GU:   Gross hematuria - R31.0 She has gross hematuria that appears to be originating in the right kidney and she has chronic inflammation and obstruction of that kidney, I am going to get a cytology today and will get her set up for ureteroscopy and stenting to better evaluate the collecting system. With CKD3 I think it would be best if she is able to avoid right nephrectomy. I have reviewed the risks of the procedure in detail including bleeding, infection, ureteral injury, need for a stent and secondary procedures, thrombotic events and anesthetic complications.   2   Hydronephrosis Unspec - N13.30 Right   PLAN:           Orders Labs Urine Cytology          Schedule Return Visit/Planned Activity: Next Available Appointment - Schedule Surgery          Document Letter(s):  Created for Patient: Clinical Summary

## 2017-04-15 ENCOUNTER — Ambulatory Visit (HOSPITAL_BASED_OUTPATIENT_CLINIC_OR_DEPARTMENT_OTHER)
Admission: RE | Admit: 2017-04-15 | Discharge: 2017-04-15 | Disposition: A | Discharge status: Home / Self Care | Payer: Medicare Other | Source: Ambulatory Visit | Attending: Urology | Admitting: Urology

## 2017-04-15 ENCOUNTER — Encounter (HOSPITAL_BASED_OUTPATIENT_CLINIC_OR_DEPARTMENT_OTHER): Payer: Self-pay

## 2017-04-15 ENCOUNTER — Encounter (HOSPITAL_BASED_OUTPATIENT_CLINIC_OR_DEPARTMENT_OTHER)
Admission: RE | Disposition: A | Discharge status: Home / Self Care | Payer: Self-pay | Source: Ambulatory Visit | Attending: Urology

## 2017-04-15 ENCOUNTER — Ambulatory Visit (HOSPITAL_BASED_OUTPATIENT_CLINIC_OR_DEPARTMENT_OTHER): Payer: Medicare Other | Admitting: Anesthesiology

## 2017-04-15 DIAGNOSIS — M1990 Unspecified osteoarthritis, unspecified site: Secondary | ICD-10-CM | POA: Diagnosis not present

## 2017-04-15 DIAGNOSIS — Z87891 Personal history of nicotine dependence: Secondary | ICD-10-CM | POA: Diagnosis not present

## 2017-04-15 DIAGNOSIS — N1330 Unspecified hydronephrosis: Secondary | ICD-10-CM | POA: Insufficient documentation

## 2017-04-15 DIAGNOSIS — Z79899 Other long term (current) drug therapy: Secondary | ICD-10-CM | POA: Insufficient documentation

## 2017-04-15 DIAGNOSIS — I129 Hypertensive chronic kidney disease with stage 1 through stage 4 chronic kidney disease, or unspecified chronic kidney disease: Secondary | ICD-10-CM | POA: Diagnosis not present

## 2017-04-15 DIAGNOSIS — K219 Gastro-esophageal reflux disease without esophagitis: Secondary | ICD-10-CM | POA: Diagnosis not present

## 2017-04-15 DIAGNOSIS — E119 Type 2 diabetes mellitus without complications: Secondary | ICD-10-CM | POA: Insufficient documentation

## 2017-04-15 DIAGNOSIS — R310 Gross hematuria: Secondary | ICD-10-CM | POA: Diagnosis not present

## 2017-04-15 DIAGNOSIS — I4510 Unspecified right bundle-branch block: Secondary | ICD-10-CM | POA: Insufficient documentation

## 2017-04-15 DIAGNOSIS — I452 Bifascicular block: Secondary | ICD-10-CM | POA: Insufficient documentation

## 2017-04-15 DIAGNOSIS — N183 Chronic kidney disease, stage 3 (moderate): Secondary | ICD-10-CM | POA: Insufficient documentation

## 2017-04-15 DIAGNOSIS — Z794 Long term (current) use of insulin: Secondary | ICD-10-CM | POA: Diagnosis not present

## 2017-04-15 HISTORY — DX: Dyspnea, unspecified: R06.00

## 2017-04-15 HISTORY — DX: Chronic kidney disease, stage 3 unspecified: N18.30

## 2017-04-15 HISTORY — PX: CYSTOSCOPY WITH RETROGRADE PYELOGRAM, URETEROSCOPY AND STENT PLACEMENT: SHX5789

## 2017-04-15 HISTORY — DX: Other forms of dyspnea: R06.09

## 2017-04-15 HISTORY — DX: Low back pain, unspecified: M54.50

## 2017-04-15 HISTORY — DX: Other chronic pain: G89.29

## 2017-04-15 HISTORY — DX: Low back pain: M54.5

## 2017-04-15 HISTORY — DX: Opioid use, unspecified, uncomplicated: F11.90

## 2017-04-15 HISTORY — DX: Chronic kidney disease, stage 3 (moderate): N18.3

## 2017-04-15 HISTORY — PX: CYSTOSCOPY WITH BIOPSY: SHX5122

## 2017-04-15 LAB — POCT I-STAT, CHEM 8
BUN: 51 mg/dL — AB (ref 6–20)
CALCIUM ION: 1.27 mmol/L (ref 1.15–1.40)
CHLORIDE: 104 mmol/L (ref 101–111)
Creatinine, Ser: 1.7 mg/dL — ABNORMAL HIGH (ref 0.44–1.00)
GLUCOSE: 133 mg/dL — AB (ref 65–99)
HCT: 38 % (ref 36.0–46.0)
Hemoglobin: 12.9 g/dL (ref 12.0–15.0)
Potassium: 5.4 mmol/L — ABNORMAL HIGH (ref 3.5–5.1)
Sodium: 137 mmol/L (ref 135–145)
TCO2: 24 mmol/L (ref 0–100)

## 2017-04-15 LAB — GLUCOSE, CAPILLARY: Glucose-Capillary: 166 mg/dL — ABNORMAL HIGH (ref 65–99)

## 2017-04-15 SURGERY — CYSTOURETEROSCOPY, WITH RETROGRADE PYELOGRAM AND STENT INSERTION
Anesthesia: General | Site: Ureter | Laterality: Right

## 2017-04-15 MED ORDER — PROPOFOL 10 MG/ML IV BOLUS
INTRAVENOUS | Status: DC | PRN
  Administered 2017-04-15: 100 mg via INTRAVENOUS

## 2017-04-15 MED ORDER — FENTANYL CITRATE (PF) 100 MCG/2ML IJ SOLN
INTRAMUSCULAR | Status: DC | PRN
  Administered 2017-04-15 (×2): 25 ug via INTRAVENOUS

## 2017-04-15 MED ORDER — EPHEDRINE 5 MG/ML INJ
INTRAVENOUS | Status: AC
  Filled 2017-04-15: qty 10

## 2017-04-15 MED ORDER — IOHEXOL 300 MG/ML  SOLN
INTRAMUSCULAR | Status: DC | PRN
  Administered 2017-04-15: 20 mL

## 2017-04-15 MED ORDER — LIDOCAINE 2% (20 MG/ML) 5 ML SYRINGE
INTRAMUSCULAR | Status: AC
Start: 2017-04-15 — End: 2017-04-15
  Filled 2017-04-15: qty 5

## 2017-04-15 MED ORDER — MIDAZOLAM HCL 2 MG/2ML IJ SOLN
INTRAMUSCULAR | Status: AC
  Filled 2017-04-15: qty 2

## 2017-04-15 MED ORDER — OXYCODONE HCL 5 MG PO TABS
5.0000 mg | ORAL_TABLET | ORAL | Status: DC | PRN
  Filled 2017-04-15: qty 2

## 2017-04-15 MED ORDER — ACETAMINOPHEN 325 MG PO TABS
650.0000 mg | ORAL_TABLET | ORAL | Status: DC | PRN
  Filled 2017-04-15: qty 2

## 2017-04-15 MED ORDER — SODIUM CHLORIDE 0.9 % IV SOLN
250.0000 mL | INTRAVENOUS | Status: DC | PRN
  Filled 2017-04-15: qty 250

## 2017-04-15 MED ORDER — SODIUM CHLORIDE 0.9 % IR SOLN
Status: DC | PRN
  Administered 2017-04-15: 1 via INTRAVESICAL

## 2017-04-15 MED ORDER — ACETAMINOPHEN 650 MG RE SUPP
650.0000 mg | RECTAL | Status: DC | PRN
  Filled 2017-04-15: qty 1

## 2017-04-15 MED ORDER — EPHEDRINE SULFATE 50 MG/ML IJ SOLN
INTRAMUSCULAR | Status: DC | PRN
  Administered 2017-04-15 (×3): 10 mg via INTRAVENOUS

## 2017-04-15 MED ORDER — DEXAMETHASONE SODIUM PHOSPHATE 10 MG/ML IJ SOLN
INTRAMUSCULAR | Status: AC
  Filled 2017-04-15: qty 1

## 2017-04-15 MED ORDER — MORPHINE SULFATE (PF) 2 MG/ML IV SOLN
2.0000 mg | INTRAVENOUS | Status: DC | PRN
  Filled 2017-04-15: qty 1

## 2017-04-15 MED ORDER — ONDANSETRON HCL 4 MG/2ML IJ SOLN
INTRAMUSCULAR | Status: DC | PRN
  Administered 2017-04-15: 4 mg via INTRAVENOUS

## 2017-04-15 MED ORDER — DEXTROSE 5 % IV SOLN
3.0000 g | INTRAVENOUS | Status: AC
  Administered 2017-04-15: 3 g via INTRAVENOUS
  Filled 2017-04-15 (×2): qty 3000

## 2017-04-15 MED ORDER — PROMETHAZINE HCL 25 MG/ML IJ SOLN
6.2500 mg | INTRAMUSCULAR | Status: DC | PRN
  Filled 2017-04-15: qty 1

## 2017-04-15 MED ORDER — LACTATED RINGERS IV SOLN
INTRAVENOUS | Status: DC | PRN
  Administered 2017-04-15: 11:00:00 via INTRAVENOUS

## 2017-04-15 MED ORDER — SODIUM CHLORIDE 0.9 % IV SOLN
INTRAVENOUS | Status: DC
Start: 2017-04-15 — End: 2017-04-15
  Administered 2017-04-15: 09:00:00 via INTRAVENOUS
  Filled 2017-04-15: qty 1000

## 2017-04-15 MED ORDER — MEPERIDINE HCL 25 MG/ML IJ SOLN
6.2500 mg | INTRAMUSCULAR | Status: DC | PRN
  Filled 2017-04-15: qty 1

## 2017-04-15 MED ORDER — PROPOFOL 10 MG/ML IV BOLUS
INTRAVENOUS | Status: AC
  Filled 2017-04-15: qty 40

## 2017-04-15 MED ORDER — FENTANYL CITRATE (PF) 100 MCG/2ML IJ SOLN
25.0000 ug | INTRAMUSCULAR | Status: DC | PRN
  Filled 2017-04-15: qty 1

## 2017-04-15 MED ORDER — STERILE WATER FOR IRRIGATION IR SOLN
Status: DC | PRN
  Administered 2017-04-15: 1

## 2017-04-15 MED ORDER — HYDROCODONE-ACETAMINOPHEN 5-325 MG PO TABS: 1.0000 | ORAL_TABLET | Freq: Four times a day (QID) | ORAL | 0 refills | Status: DC | PRN

## 2017-04-15 MED ORDER — DEXAMETHASONE SODIUM PHOSPHATE 4 MG/ML IJ SOLN
INTRAMUSCULAR | Status: DC | PRN
  Administered 2017-04-15: 10 mg via INTRAVENOUS

## 2017-04-15 MED ORDER — LACTATED RINGERS IV SOLN
INTRAVENOUS | Status: DC
  Filled 2017-04-15: qty 1000

## 2017-04-15 MED ORDER — ONDANSETRON HCL 4 MG/2ML IJ SOLN
INTRAMUSCULAR | Status: AC
  Filled 2017-04-15: qty 2

## 2017-04-15 MED ORDER — SODIUM CHLORIDE 0.9% FLUSH
3.0000 mL | INTRAVENOUS | Status: DC | PRN
  Filled 2017-04-15: qty 3

## 2017-04-15 MED ORDER — SODIUM CHLORIDE 0.9% FLUSH
3.0000 mL | Freq: Two times a day (BID) | INTRAVENOUS | Status: DC
  Filled 2017-04-15: qty 3

## 2017-04-15 MED ORDER — MIDAZOLAM HCL 5 MG/5ML IJ SOLN
INTRAMUSCULAR | Status: DC | PRN
  Administered 2017-04-15 (×2): 1 mg via INTRAVENOUS

## 2017-04-15 MED ORDER — LIDOCAINE 2% (20 MG/ML) 5 ML SYRINGE
INTRAMUSCULAR | Status: DC | PRN
  Administered 2017-04-15: 60 mg via INTRAVENOUS

## 2017-04-15 MED ORDER — FENTANYL CITRATE (PF) 100 MCG/2ML IJ SOLN
INTRAMUSCULAR | Status: AC
  Filled 2017-04-15: qty 2

## 2017-04-15 SURGICAL SUPPLY — 41 items
BAG DRAIN URO-CYSTO SKYTR STRL (DRAIN) ×4 IMPLANT
BASKET LASER NITINOL 1.9FR (BASKET) IMPLANT
BASKET ZERO TIP NITINOL 2.4FR (BASKET) IMPLANT
CATH FOLEY 2WAY SLVR  5CC 16FR (CATHETERS)
CATH FOLEY 2WAY SLVR 5CC 16FR (CATHETERS) IMPLANT
CATH URET 5FR 28IN CONE TIP (BALLOONS)
CATH URET 5FR 28IN OPEN ENDED (CATHETERS) IMPLANT
CATH URET 5FR 70CM CONE TIP (BALLOONS) IMPLANT
CLOTH BEACON ORANGE TIMEOUT ST (SAFETY) ×4 IMPLANT
ELECT REM PT RETURN 9FT ADLT (ELECTROSURGICAL) ×4
ELECTRODE REM PT RTRN 9FT ADLT (ELECTROSURGICAL) ×2 IMPLANT
FIBER LASER FLEXIVA 1000 (UROLOGICAL SUPPLIES) IMPLANT
FIBER LASER FLEXIVA 365 (UROLOGICAL SUPPLIES) IMPLANT
FIBER LASER FLEXIVA 550 (UROLOGICAL SUPPLIES) IMPLANT
FIBER LASER TRAC TIP (UROLOGICAL SUPPLIES) IMPLANT
GLOVE SURG SS PI 8.0 STRL IVOR (GLOVE) ×4 IMPLANT
GOWN STRL REUS W/ TWL LRG LVL3 (GOWN DISPOSABLE) ×2 IMPLANT
GOWN STRL REUS W/ TWL XL LVL3 (GOWN DISPOSABLE) ×2 IMPLANT
GOWN STRL REUS W/TWL LRG LVL3 (GOWN DISPOSABLE) ×2
GOWN STRL REUS W/TWL XL LVL3 (GOWN DISPOSABLE) ×2
GUIDEWIRE 0.038 PTFE COATED (WIRE) IMPLANT
GUIDEWIRE ANG ZIPWIRE 038X150 (WIRE) IMPLANT
GUIDEWIRE STR DUAL SENSOR (WIRE) ×4 IMPLANT
IV NS IRRIG 3000ML ARTHROMATIC (IV SOLUTION) ×4 IMPLANT
KIT BALLIN UROMAX 15FX10 (LABEL) IMPLANT
KIT BALLN UROMAX 15FX4 (MISCELLANEOUS) IMPLANT
KIT BALLN UROMAX 26 75X4 (MISCELLANEOUS)
KIT RM TURNOVER CYSTO AR (KITS) ×4 IMPLANT
MANIFOLD NEPTUNE II (INSTRUMENTS) IMPLANT
NDL SAFETY ECLIPSE 18X1.5 (NEEDLE) IMPLANT
NEEDLE HYPO 18GX1.5 SHARP (NEEDLE)
NEEDLE HYPO 22GX1.5 SAFETY (NEEDLE) IMPLANT
NS IRRIG 500ML POUR BTL (IV SOLUTION) IMPLANT
PACK CYSTO (CUSTOM PROCEDURE TRAY) ×4 IMPLANT
SET HIGH PRES BAL DIL (LABEL)
SHEATH ACCESS URETERAL 38CM (SHEATH) IMPLANT
STENT URET 6FRX24 CONTOUR (STENTS) ×4 IMPLANT
SYR 20CC LL (SYRINGE) IMPLANT
TUBE CONNECTING 12'X1/4 (SUCTIONS)
TUBE CONNECTING 12X1/4 (SUCTIONS) IMPLANT
WATER STERILE IRR 3000ML UROMA (IV SOLUTION) ×4 IMPLANT

## 2017-04-15 NOTE — Anesthesia Preprocedure Evaluation (Signed)
Anesthesia Evaluation  Patient identified by MRN, date of birth, ID band Patient awake    Reviewed: Allergy & Precautions, NPO status , Patient's Chart, lab work & pertinent test results  Airway Mallampati: I  TM Distance: >3 FB Neck ROM: Full    Dental  (+) Teeth Intact, Dental Advisory Given   Pulmonary former smoker,    breath sounds clear to auscultation       Cardiovascular hypertension, Pt. on medications  Rhythm:Regular Rate:Normal     Neuro/Psych PSYCHIATRIC DISORDERS Anxiety negative neurological ROS     GI/Hepatic Neg liver ROS, GERD  Medicated,  Endo/Other  diabetes, Type 2, Insulin Dependent  Renal/GU CRFRenal disease  negative genitourinary   Musculoskeletal negative musculoskeletal ROS (+)   Abdominal   Peds negative pediatric ROS (+)  Hematology negative hematology ROS (+)   Anesthesia Other Findings   Reproductive/Obstetrics negative OB ROS                             Lab Results  Component Value Date   WBC 5.0 03/14/2016   HGB 12.9 04/15/2017   HCT 38.0 04/15/2017   MCV 89.7 03/14/2016   PLT 320 03/14/2016   Lab Results  Component Value Date   CREATININE 1.70 (H) 04/15/2017   BUN 51 (H) 04/15/2017   NA 137 04/15/2017   K 5.4 (H) 04/15/2017   CL 104 04/15/2017   CO2 20 (L) 03/14/2016   No results found for: INR, PROTIME  03/2017: EKG: normal sinus rhythm.  Anesthesia Physical Anesthesia Plan  ASA: III  Anesthesia Plan: General   Post-op Pain Management:    Induction: Intravenous  Airway Management Planned: LMA  Additional Equipment:   Intra-op Plan:   Post-operative Plan: Extubation in OR  Informed Consent: I have reviewed the patients History and Physical, chart, labs and discussed the procedure including the risks, benefits and alternatives for the proposed anesthesia with the patient or authorized representative who has indicated his/her  understanding and acceptance.   Dental advisory given  Plan Discussed with: CRNA  Anesthesia Plan Comments:         Anesthesia Quick Evaluation

## 2017-04-15 NOTE — Brief Op Note (Signed)
04/15/2017  11:09 AM  PATIENT:  Valerie [name]  71 y.o. female  PRE-OPERATIVE DIAGNOSIS:  RIGHT HYDRONEPHROSIS AND HEMATURIA  POST-OPERATIVE DIAGNOSIS:  RIGHT URETERAL AND RENAL INFLAMMATION WITH OBSTRUCTION  PROCEDURE:  Procedure(s): CYSTOSCOPY WITH RIGHT RETROGRADE, RIGHT URETEROSCOPY AND RIGHT STENT PLACEMENT (Right)   SURGEON:  Surgeon(s) and Role:    * Bjorn Pippin, MD - Primary  PHYSICIAN ASSISTANT:   ASSISTANTS: none   ANESTHESIA:   general  EBL:  Total I/O In: 500 [I.V.:500] Out: 0   BLOOD ADMINISTERED:none  DRAINS: 6 X 24 RIGHT JJ STENT   LOCAL MEDICATIONS USED:  NONE  SPECIMEN:  Source of Specimen:  RIGHT RENAL URINE FOR  CULTURE AND CYTOLOGY  DISPOSITION OF SPECIMEN:  PATHOLOGY  COUNTS:  YES  TOURNIQUET:  * No tourniquets in log *  DICTATION: .Other Dictation: Dictation Number [id number]  PLAN OF CARE: Discharge to home after PACU  PATIENT DISPOSITION:  PACU - hemodynamically stable.   Delay start of Pharmacological VTE agent (>24hrs) due to surgical blood loss or risk of bleeding: not applicable

## 2017-04-15 NOTE — Anesthesia Procedure Notes (Signed)
Procedure Name: LMA Insertion Date/Time: 04/15/2017 10:36 AM Performed by: Jessica Priest Pre-anesthesia Checklist: Patient identified, Emergency Drugs available, Suction available and Patient being monitored Patient Re-evaluated:Patient Re-evaluated prior to inductionOxygen Delivery Method: Circle system utilized Preoxygenation: Pre-oxygenation with 100% oxygen Intubation Type: IV induction Ventilation: Mask ventilation without difficulty LMA: LMA inserted LMA Size: 4.0 Number of attempts: 1 Airway Equipment and Method: Bite block Placement Confirmation: positive ETCO2 Tube secured with: Tape Dental Injury: Teeth and Oropharynx as per pre-operative assessment

## 2017-04-15 NOTE — Discharge Instructions (Signed)
°Post Anesthesia Home Care Instructions ° °Activity: °Get plenty of rest for the remainder of the day. A responsible individual must stay with you for 24 hours following the procedure.  °For the next 24 hours, DO NOT: °-Drive a car °-Operate machinery °-Drink alcoholic beverages °-Take any medication unless instructed by your physician °-Make any legal decisions or sign important papers. ° °Meals: °Start with liquid foods such as gelatin or soup. Progress to regular foods as tolerated. Avoid greasy, spicy, heavy foods. If nausea and/or vomiting occur, drink only clear liquids until the nausea and/or vomiting subsides. Call your physician if vomiting continues. ° °Special Instructions/Symptoms: °Your throat may feel dry or sore from the anesthesia or the breathing tube placed in your throat during surgery. If this causes discomfort, gargle with warm salt water. The discomfort should disappear within 24 hours. ° °If you had a scopolamine patch placed behind your ear for the management of post- operative nausea and/or vomiting: ° °1. The medication in the patch is effective for 72 hours, after which it should be removed.  Wrap patch in a tissue and discard in the trash. Wash hands thoroughly with soap and water. °2. You may remove the patch earlier than 72 hours if you experience unpleasant side effects which may include dry mouth, dizziness or visual disturbances. °3. Avoid touching the patch. Wash your hands with soap and water after contact with the patch. °  °Ureteral Stent Implantation, Care After °Refer to this sheet in the next few weeks. These instructions provide you with information about caring for yourself after your procedure. Your health care provider may also give you more specific instructions. Your treatment has been planned according to current medical practices, but problems sometimes occur. Call your health care provider if you have any problems or questions after your procedure. °What can I expect  after the procedure? °After the procedure, it is common to have: °· Nausea. °· Mild pain when you urinate. You may feel this pain in your lower back or lower abdomen. Pain should stop within a few minutes after you urinate. This may last for up to 1 week. °· A small amount of blood in your urine for several days. °Follow these instructions at home: °  °Medicines  °· Take over-the-counter and prescription medicines only as told by your health care provider. °· If you were prescribed an antibiotic medicine, take it as told by your health care provider. Do not stop taking the antibiotic even if you start to feel better. °· Do not drive for 24 hours if you received a sedative. °· Do not drive or operate heavy machinery while taking prescription pain medicines. °Activity  °· Return to your normal activities as told by your health care provider. Ask your health care provider what activities are safe for you. °· Do not lift anything that is heavier than 10 lb (4.5 kg). Follow this limit for 1 week after your procedure, or for as long as told by your health care provider. °General instructions  °· Watch for any blood in your urine. Call your health care provider if the amount of blood in your urine increases. °· If you have a catheter: °¨ Follow instructions from your health care provider about taking care of your catheter and collection bag. °¨ Do not take baths, swim, or use a hot tub until your health care provider approves. °· Drink enough fluid to keep your urine clear or pale yellow. °· Keep all follow-up visits as told by your   health care provider. This is important. °Contact a health care provider if: °· You have pain that gets worse or does not get better with medicine, especially pain when you urinate. °· You have difficulty urinating. °· You feel nauseous or you vomit repeatedly during a period of more than 2 days after the procedure. °Get help right away if: °· Your urine is dark red or has blood clots in  it. °· You are leaking urine (have incontinence). °· The end of the stent comes out of your urethra. °· You cannot urinate. °· You have sudden, sharp, or severe pain in your abdomen or lower back. °· You have a fever. °This information is not intended to replace advice given to you by your health care provider. Make sure you discuss any questions you have with your health care provider. °Document Released: 08/16/2013 Document Revised: 05/21/2016 Document Reviewed: 06/28/2015 °Elsevier Interactive Patient Education © 2017 [employer] ° °

## 2017-04-15 NOTE — Transfer of Care (Signed)
Immediate Anesthesia Transfer of Care Note  Patient: Valerie [name]  Procedure(s) Performed: Procedure(s) (LRB): CYSTOSCOPY WITH RIGHT RETROGRADE URETEROSCOPY AND RIGHT STENT PLACEMENT (Right) CYSTOSCOPY WITH BIOPSY (N/A)  Patient Location: PACU  Anesthesia Type: General  Level of Consciousness: awake, sedated, patient cooperative and responds to stimulation  Airway & Oxygen Therapy: Patient Spontanous Breathing and Patient connected to [place] O2  Post-op Assessment: Report given to PACU RN, Post -op Vital signs reviewed and stable and Patient moving all extremities  Post vital signs: Reviewed and stable  Complications: No apparent anesthesia complications

## 2017-04-15 NOTE — Op Note (Signed)
NAME:  Valerie [name], Valerie [name]                    ACCOUNT NO.:  MEDICAL RECORD NO.:  [id number]  LOCATION:                                 FACILITY:  PHYSICIAN:  Excell Seltzer. Annabell Howells, M.D.    DATE OF BIRTH:  [birth year]  DATE OF PROCEDURE:  04/15/2017 DATE OF DISCHARGE:                              OPERATIVE REPORT   PROCEDURE: 1. Cystoscopy with right retrograde pyelogram and interpretation. 2. Right ureteroscopy. 3. Insertion of right double-J stent.  PREOPERATIVE DIAGNOSIS:  Right hydronephrosis with a history of chronic intrarenal inflammation with new-onset hematuria.  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  SURGEON:  Excell Seltzer. Annabell Howells, M.D.  ANESTHESIA:  General.  SPECIMEN:  Urine from right kidney for culture and cytology.  DRAINS:  A 6-French x 24 cm right double-J stent.  BLOOD LOSS:  Minimal.  COMPLICATIONS:  None.  INDICATIONS:  Ms. Burpee is a 71 year old white female diabetic with a history of chronic pyuria with negative cultures.  She has previously undergone ureteroscopy for hydronephrosis and was found to have inflammatory changes in the proximal ureter and kidney.  She recently presented with new-onset bleeding, and a CT scan revealed a filling defect in the collecting system that was mobile and was felt to be a clot, but it was felt that reinspection was indicated.  FINDINGS AND PROCEDURE:  She was taken to the operating room where she was given Ancef.  A general anesthetic was induced.  She was placed in lithotomy position.  Her perineum and genitalia were prepped with Betadine solution.  She was draped in usual sterile fashion.  Cystoscopy was performed using a 23-French scope and 30-degree lens. Examination revealed a normal urethra.  The bladder wall had mild trabeculation.  No tumors or stones were noted.  The ureteral orifices were in the normal anatomic position.  The left was  unremarkable.  The right had some perimeatal erythema and of note on placing the scope, a small clot had washed out of the bladder, but none were seen in the bladder.  A 5-French open-end catheter was then used to perform a retrograde pyelogram on the right with Omnipaque.  Retrograde pyelogram revealed some narrowing of the distal ureter with fusiform dilation of the mid and proximal ureter to approximately halfway up the proximal ureter.  Then, there was additional narrowing with dilation of the intrarenal collecting system without obvious filling defect.  The guidewire was advanced to the kidney using the Sensor wire to aid passage and a brisk hydronephrotic drip was noted of bloody purulent urine.  Approximately 20 mL were collected to be sent for culture and cytology.  A guidewire was then passed to the kidney.  The open-end catheter was removed.  A 38 cm digital access sheath was then inserted.  Initially, the inner core was used to assess ureteral compliance and then the assembled sheath was passed without difficulty to the kidney.  The dual-lumen digital flexible scope was then used to inspect the kidney thoroughly once the bloody purulent urine had been irrigated out. This revealed some areas of an inflammatory change in the calyces and  in the renal pelvis particularly at the UPJ, but no papillary tumors or other findings suggestive of carcinoma in situ.  No papillary necrosis was noted, and while the urine was bloody, I did not find an active source of bleeding in the intrarenal collecting system.  The ureteroscope was then backed up into the proximal ureter.  There was more significant inflammation in this area and manipulation of the sheath did cause some mild mucosal stripping with subsequent bleeding.  At this point, a guidewire was passed through the ureteroscope to the kidney.  The ureteroscope and sheath were then removed with visual inspection of the ureter.   No additional abnormalities were noted.  Once the ureteroscope was out, the cystoscope was reinserted over the wire and a 6-French 24 cm Contour double-J stent was advanced to the kidney under fluoroscopic guidance.  The wire was removed leaving good coil in the kidney, a good coil in the bladder.  The bladder was then drained.  The cystoscope was removed.  The patient was taken down from lithotomy position.  Her anesthetic was reversed.  She was moved to the recovery room in stable condition.     Excell Seltzer. Annabell Howells, M.D.     JJW/MEDQ  D:  04/15/2017  T:  04/15/2017  Job:  [id number]

## 2017-04-15 NOTE — Interval H&P Note (Signed)
History and Physical Interval Note:  04/15/2017 10:15 AM  Valerie [name]  has presented today for surgery, with the diagnosis of RIGHT HYDRONEPHROSIS AND HEMATURIA  The various methods of treatment have been discussed with the patient and family. After consideration of risks, benefits and other options for treatment, the patient has consented to  Procedure(s): CYSTOSCOPY WITH RIGHT RETROGRADE URETEROSCOPY AND RIGHT STENT PLACEMENT (Right) CYSTOSCOPY WITH BIOPSY (N/A) as a surgical intervention .  The patient's history has been reviewed, patient examined, no change in status, stable for surgery.  I have reviewed the patient's chart and labs.  Questions were answered to the patient's satisfaction.     Birgitta Uhlir J

## 2017-04-15 NOTE — Anesthesia Postprocedure Evaluation (Addendum)
Anesthesia Post Note  Patient: Valerie [name]  Procedure(s) Performed: Procedure(s) (LRB): CYSTOSCOPY WITH RIGHT RETROGRADE URETEROSCOPY AND RIGHT STENT PLACEMENT (Right) CYSTOSCOPY WITH BIOPSY (N/A)  Patient location during evaluation: PACU Anesthesia Type: General Level of consciousness: awake and alert Pain management: pain level controlled Vital Signs Assessment: post-procedure vital signs reviewed and stable Respiratory status: spontaneous breathing, nonlabored ventilation, respiratory function stable and patient connected to nasal cannula oxygen Cardiovascular status: blood pressure returned to baseline and stable Postop Assessment: no signs of nausea or vomiting Anesthetic complications: no       Last Vitals:  Vitals:   04/15/17 1215 04/15/17 1230  BP: 127/72 128/70  Pulse: 85 86  Resp: 13 13  Temp:      Last Pain:  Vitals:   04/15/17 1245  TempSrc:   PainSc: 0-No pain                 Shelton Silvas

## 2017-04-16 ENCOUNTER — Encounter (HOSPITAL_BASED_OUTPATIENT_CLINIC_OR_DEPARTMENT_OTHER): Payer: Self-pay | Admitting: Urology

## 2017-04-16 LAB — URINE CULTURE: Culture: NO GROWTH

## 2017-04-22 DIAGNOSIS — T8189XA Other complications of procedures, not elsewhere classified, initial encounter: Secondary | ICD-10-CM | POA: Diagnosis not present

## 2017-04-22 DIAGNOSIS — I10 Essential (primary) hypertension: Secondary | ICD-10-CM | POA: Diagnosis not present

## 2017-04-22 DIAGNOSIS — E1140 Type 2 diabetes mellitus with diabetic neuropathy, unspecified: Secondary | ICD-10-CM | POA: Diagnosis not present

## 2017-04-22 DIAGNOSIS — Y838 Other surgical procedures as the cause of abnormal reaction of the patient, or of later complication, without mention of misadventure at the time of the procedure: Secondary | ICD-10-CM | POA: Diagnosis not present

## 2017-04-30 ENCOUNTER — Encounter (HOSPITAL_BASED_OUTPATIENT_CLINIC_OR_DEPARTMENT_OTHER): Payer: Medicare Other | Attending: Internal Medicine

## 2017-04-30 DIAGNOSIS — T8131XD Disruption of external operation (surgical) wound, not elsewhere classified, subsequent encounter: Secondary | ICD-10-CM | POA: Insufficient documentation

## 2017-04-30 DIAGNOSIS — I10 Essential (primary) hypertension: Secondary | ICD-10-CM | POA: Insufficient documentation

## 2017-04-30 DIAGNOSIS — S31105S Unspecified open wound of abdominal wall, periumbilic region without penetration into peritoneal cavity, sequela: Secondary | ICD-10-CM | POA: Insufficient documentation

## 2017-04-30 DIAGNOSIS — H2510 Age-related nuclear cataract, unspecified eye: Secondary | ICD-10-CM | POA: Diagnosis not present

## 2017-04-30 DIAGNOSIS — D649 Anemia, unspecified: Secondary | ICD-10-CM | POA: Diagnosis not present

## 2017-04-30 DIAGNOSIS — Y839 Surgical procedure, unspecified as the cause of abnormal reaction of the patient, or of later complication, without mention of misadventure at the time of the procedure: Secondary | ICD-10-CM | POA: Insufficient documentation

## 2017-04-30 DIAGNOSIS — G629 Polyneuropathy, unspecified: Secondary | ICD-10-CM | POA: Diagnosis not present

## 2017-04-30 DIAGNOSIS — M1990 Unspecified osteoarthritis, unspecified site: Secondary | ICD-10-CM | POA: Insufficient documentation

## 2017-05-05 ENCOUNTER — Ambulatory Visit: Payer: Medicare Other | Admitting: Podiatry

## 2017-05-10 DIAGNOSIS — S31105S Unspecified open wound of abdominal wall, periumbilic region without penetration into peritoneal cavity, sequela: Secondary | ICD-10-CM | POA: Diagnosis not present

## 2017-05-13 ENCOUNTER — Other Ambulatory Visit: Payer: Self-pay | Admitting: Urology

## 2017-05-18 ENCOUNTER — Ambulatory Visit (INDEPENDENT_AMBULATORY_CARE_PROVIDER_SITE_OTHER): Payer: Medicare Other | Admitting: Podiatry

## 2017-05-18 ENCOUNTER — Encounter: Payer: Self-pay | Admitting: Podiatry

## 2017-05-18 DIAGNOSIS — M2040 Other hammer toe(s) (acquired), unspecified foot: Secondary | ICD-10-CM

## 2017-05-18 DIAGNOSIS — M79676 Pain in unspecified toe(s): Secondary | ICD-10-CM

## 2017-05-18 DIAGNOSIS — E1142 Type 2 diabetes mellitus with diabetic polyneuropathy: Secondary | ICD-10-CM

## 2017-05-18 DIAGNOSIS — B351 Tinea unguium: Secondary | ICD-10-CM

## 2017-05-18 NOTE — Progress Notes (Signed)
Complaint:  Visit Type: Patient returns to my office for continued preventative foot care services. Complaint: Patient states" my nails have grown long and thick and become painful to walk and wear shoes" Patient has been diagnosed with DM with neuropathy.. The patient presents for preventative foot care services. No changes to ROS  Podiatric Exam: Vascular: dorsalis pedis and posterior tibial pulses are palpable bilateral. Capillary return is immediate. Temperature gradient is WNL. Skin turgor WNL  Sensorium: Absent LOPS in toes.  LOPS in foot WNL. Normal tactile sensation bilaterally. Nail Exam: Pt has thick disfigured discolored nails with subungual debris noted bilateral entire nail hallux through fifth toenails Ulcer Exam: There is no evidence of ulcer or pre-ulcerative changes or infection. Orthopedic Exam: Muscle tone and strength are WNL. No limitations in general ROM. No crepitus or effusions noted. Hammer toes  B/L. Bony prominences are unremarkable. Skin: No Porokeratosis. No infection or ulcers.  Multiple hematomas noted on second and third toes  B/L.No infections noted.    Diagnosis:  Onychomycosis, , Pain in right toe, pain in left toes  Treatment & Plan Procedures and Treatment: Consent by patient was obtained for treatment procedures. The patient understood the discussion of treatment and procedures well. All questions were answered thoroughly reviewed. Debridement of mycotic and hypertrophic toenails, 1 through 5 bilateral and clearing of subungual debris. No ulceration, no infection noted. Determined she has neuropathy in her toes and did not know her shoes were causing her hematomas.  She was told to wear proper footgear and allow her toes to heal. Return Visit-Office Procedure: Patient instructed to return to the office for a follow up visit 3 months for continued evaluation and treatment. Patient says she has diabetic shoes at home.   Helane GuntherGregory Karter Haire DPM

## 2017-05-20 DIAGNOSIS — S31105S Unspecified open wound of abdominal wall, periumbilic region without penetration into peritoneal cavity, sequela: Secondary | ICD-10-CM | POA: Diagnosis not present

## 2017-05-28 NOTE — Addendum Note (Signed)
Addendum  created 05/28/17 1210 by Eva Vallee D, MD   Sign clinical note    

## 2017-06-03 ENCOUNTER — Encounter (HOSPITAL_BASED_OUTPATIENT_CLINIC_OR_DEPARTMENT_OTHER): Payer: Medicare Other | Attending: Internal Medicine

## 2017-06-03 DIAGNOSIS — Y838 Other surgical procedures as the cause of abnormal reaction of the patient, or of later complication, without mention of misadventure at the time of the procedure: Secondary | ICD-10-CM | POA: Diagnosis not present

## 2017-06-03 DIAGNOSIS — E1140 Type 2 diabetes mellitus with diabetic neuropathy, unspecified: Secondary | ICD-10-CM | POA: Insufficient documentation

## 2017-06-03 DIAGNOSIS — T8189XA Other complications of procedures, not elsewhere classified, initial encounter: Secondary | ICD-10-CM | POA: Insufficient documentation

## 2017-06-03 DIAGNOSIS — I10 Essential (primary) hypertension: Secondary | ICD-10-CM | POA: Diagnosis not present

## 2017-06-17 DIAGNOSIS — T8189XA Other complications of procedures, not elsewhere classified, initial encounter: Secondary | ICD-10-CM | POA: Diagnosis not present

## 2017-06-23 ENCOUNTER — Encounter (HOSPITAL_BASED_OUTPATIENT_CLINIC_OR_DEPARTMENT_OTHER): Payer: Self-pay | Admitting: *Deleted

## 2017-06-23 NOTE — Progress Notes (Signed)
NPO AFTER MN.  ARRIVE AT 1000.  NEEDS ISTAT 8.  CURRENT EKG IN CHART AND EPIC.  WILL TAKE PROTONIX, NORVASC, MRYBETRIQ, AND OPANA ER AND IF NEEDED TAKE OPANA AM DOS W/ SIPS OF WATER.  NOTED PT LAST OFFICE VISIT W/ ENDOCRINOLOGIST , DR Leslie DalesALTHEIMER,  THAT SHE WAS TO STOP LISINOPRIL DUE TO ELEVATED K+, PT STATED SHE WAS STILL TAKING IT.  ADVISED PT TO CALL HIS OFFICE AND VERIFY THAT SHE WAS TO STOP LISINOPRIL,  PT STATED SHE WOULD.

## 2017-07-01 ENCOUNTER — Encounter (HOSPITAL_BASED_OUTPATIENT_CLINIC_OR_DEPARTMENT_OTHER): Payer: Self-pay | Admitting: *Deleted

## 2017-07-01 ENCOUNTER — Ambulatory Visit (HOSPITAL_BASED_OUTPATIENT_CLINIC_OR_DEPARTMENT_OTHER): Payer: Medicare Other | Admitting: Anesthesiology

## 2017-07-01 ENCOUNTER — Encounter (HOSPITAL_BASED_OUTPATIENT_CLINIC_OR_DEPARTMENT_OTHER)
Admission: RE | Disposition: A | Discharge status: Home / Self Care | Payer: Self-pay | Source: Ambulatory Visit | Attending: Urology

## 2017-07-01 ENCOUNTER — Ambulatory Visit (HOSPITAL_BASED_OUTPATIENT_CLINIC_OR_DEPARTMENT_OTHER)
Admission: RE | Admit: 2017-07-01 | Discharge: 2017-07-01 | Disposition: A | Discharge status: Home / Self Care | Payer: Medicare Other | Source: Ambulatory Visit | Attending: Urology | Admitting: Urology

## 2017-07-01 DIAGNOSIS — R310 Gross hematuria: Secondary | ICD-10-CM | POA: Insufficient documentation

## 2017-07-01 DIAGNOSIS — Z794 Long term (current) use of insulin: Secondary | ICD-10-CM | POA: Diagnosis not present

## 2017-07-01 DIAGNOSIS — K219 Gastro-esophageal reflux disease without esophagitis: Secondary | ICD-10-CM | POA: Diagnosis not present

## 2017-07-01 DIAGNOSIS — I10 Essential (primary) hypertension: Secondary | ICD-10-CM | POA: Insufficient documentation

## 2017-07-01 DIAGNOSIS — N1330 Unspecified hydronephrosis: Secondary | ICD-10-CM | POA: Insufficient documentation

## 2017-07-01 DIAGNOSIS — Z87891 Personal history of nicotine dependence: Secondary | ICD-10-CM | POA: Diagnosis not present

## 2017-07-01 DIAGNOSIS — F419 Anxiety disorder, unspecified: Secondary | ICD-10-CM | POA: Diagnosis not present

## 2017-07-01 DIAGNOSIS — E119 Type 2 diabetes mellitus without complications: Secondary | ICD-10-CM | POA: Diagnosis not present

## 2017-07-01 DIAGNOSIS — Z79899 Other long term (current) drug therapy: Secondary | ICD-10-CM | POA: Diagnosis not present

## 2017-07-01 HISTORY — DX: Spondylosis, unspecified: M47.9

## 2017-07-01 HISTORY — DX: Bifascicular block: I45.2

## 2017-07-01 HISTORY — PX: CYSTOSCOPY WITH STENT PLACEMENT: SHX5790

## 2017-07-01 HISTORY — DX: Unspecified right bundle-branch block: I45.10

## 2017-07-01 LAB — GLUCOSE, CAPILLARY
Glucose-Capillary: 192 mg/dL — ABNORMAL HIGH (ref 65–99)
Glucose-Capillary: 208 mg/dL — ABNORMAL HIGH (ref 65–99)

## 2017-07-01 LAB — POCT I-STAT, CHEM 8
BUN: 36 mg/dL — AB (ref 6–20)
CALCIUM ION: 1.26 mmol/L (ref 1.15–1.40)
Chloride: 102 mmol/L (ref 101–111)
Creatinine, Ser: 1 mg/dL (ref 0.44–1.00)
Glucose, Bld: 183 mg/dL — ABNORMAL HIGH (ref 65–99)
HCT: 35 % — ABNORMAL LOW (ref 36.0–46.0)
Hemoglobin: 11.9 g/dL — ABNORMAL LOW (ref 12.0–15.0)
Potassium: 5 mmol/L (ref 3.5–5.1)
SODIUM: 138 mmol/L (ref 135–145)
TCO2: 26 mmol/L (ref 0–100)

## 2017-07-01 SURGERY — CYSTOSCOPY, WITH STENT INSERTION
Anesthesia: General | Laterality: Right

## 2017-07-01 MED ORDER — ACETAMINOPHEN 650 MG RE SUPP
650.0000 mg | RECTAL | Status: DC | PRN
  Filled 2017-07-01: qty 1

## 2017-07-01 MED ORDER — SODIUM CHLORIDE 0.9% FLUSH
3.0000 mL | Freq: Two times a day (BID) | INTRAVENOUS | Status: DC
  Filled 2017-07-01: qty 3

## 2017-07-01 MED ORDER — EPHEDRINE SULFATE-NACL 50-0.9 MG/10ML-% IV SOSY
PREFILLED_SYRINGE | INTRAVENOUS | Status: DC | PRN
  Administered 2017-07-01 (×3): 10 mg via INTRAVENOUS

## 2017-07-01 MED ORDER — PROPOFOL 10 MG/ML IV BOLUS
INTRAVENOUS | Status: AC
  Filled 2017-07-01: qty 20

## 2017-07-01 MED ORDER — SODIUM CHLORIDE 0.9 % IV SOLN
250.0000 mL | INTRAVENOUS | Status: DC | PRN
  Filled 2017-07-01: qty 250

## 2017-07-01 MED ORDER — SODIUM CHLORIDE 0.9 % IV SOLN
INTRAVENOUS | Status: DC
  Administered 2017-07-01: 11:00:00 via INTRAVENOUS
  Filled 2017-07-01: qty 1000

## 2017-07-01 MED ORDER — EPHEDRINE 5 MG/ML INJ
INTRAVENOUS | Status: AC
  Filled 2017-07-01: qty 10

## 2017-07-01 MED ORDER — STERILE WATER FOR IRRIGATION IR SOLN
Status: DC | PRN
  Administered 2017-07-01: 3000 mL

## 2017-07-01 MED ORDER — ACETAMINOPHEN 325 MG PO TABS
650.0000 mg | ORAL_TABLET | ORAL | Status: DC | PRN
  Filled 2017-07-01: qty 2

## 2017-07-01 MED ORDER — LIDOCAINE 2% (20 MG/ML) 5 ML SYRINGE
INTRAMUSCULAR | Status: DC | PRN
  Administered 2017-07-01: 60 mg via INTRAVENOUS

## 2017-07-01 MED ORDER — MORPHINE SULFATE (PF) 2 MG/ML IV SOLN
2.0000 mg | INTRAVENOUS | Status: DC | PRN
  Filled 2017-07-01: qty 1

## 2017-07-01 MED ORDER — OXYCODONE HCL 5 MG PO TABS
5.0000 mg | ORAL_TABLET | ORAL | Status: DC | PRN
  Filled 2017-07-01: qty 2

## 2017-07-01 MED ORDER — ONDANSETRON HCL 4 MG/2ML IJ SOLN
INTRAMUSCULAR | Status: DC | PRN
  Administered 2017-07-01: 4 mg via INTRAVENOUS

## 2017-07-01 MED ORDER — CIPROFLOXACIN IN D5W 400 MG/200ML IV SOLN
INTRAVENOUS | Status: AC
  Filled 2017-07-01: qty 200

## 2017-07-01 MED ORDER — LIDOCAINE 2% (20 MG/ML) 5 ML SYRINGE
INTRAMUSCULAR | Status: AC
  Filled 2017-07-01: qty 5

## 2017-07-01 MED ORDER — PROPOFOL 10 MG/ML IV BOLUS
INTRAVENOUS | Status: DC | PRN
  Administered 2017-07-01: 100 mg via INTRAVENOUS

## 2017-07-01 MED ORDER — CIPROFLOXACIN IN D5W 400 MG/200ML IV SOLN
400.0000 mg | Freq: Two times a day (BID) | INTRAVENOUS | Status: DC
  Administered 2017-07-01: 400 mg via INTRAVENOUS
  Filled 2017-07-01: qty 200

## 2017-07-01 MED ORDER — ONDANSETRON HCL 4 MG/2ML IJ SOLN
INTRAMUSCULAR | Status: AC
  Filled 2017-07-01: qty 2

## 2017-07-01 MED ORDER — FENTANYL CITRATE (PF) 100 MCG/2ML IJ SOLN
INTRAMUSCULAR | Status: DC | PRN
  Administered 2017-07-01: 25 ug via INTRAVENOUS

## 2017-07-01 MED ORDER — FENTANYL CITRATE (PF) 100 MCG/2ML IJ SOLN
INTRAMUSCULAR | Status: AC
  Filled 2017-07-01: qty 2

## 2017-07-01 MED ORDER — CEFAZOLIN SODIUM-DEXTROSE 2-4 GM/100ML-% IV SOLN
2.0000 g | INTRAVENOUS | Status: DC
  Filled 2017-07-01: qty 100

## 2017-07-01 MED ORDER — SODIUM CHLORIDE 0.9% FLUSH
3.0000 mL | INTRAVENOUS | Status: DC | PRN
  Filled 2017-07-01: qty 3

## 2017-07-01 SURGICAL SUPPLY — 21 items
BAG DRAIN URO-CYSTO SKYTR STRL (DRAIN) ×3 IMPLANT
CATH URET 5FR 28IN CONE TIP (BALLOONS)
CATH URET 5FR 28IN OPEN ENDED (CATHETERS) ×3 IMPLANT
CATH URET 5FR 70CM CONE TIP (BALLOONS) IMPLANT
CLOTH BEACON ORANGE TIMEOUT ST (SAFETY) ×3 IMPLANT
GLOVE BIO SURGEON STRL SZ7.5 (GLOVE) ×6 IMPLANT
GLOVE SURG SS PI 8.0 STRL IVOR (GLOVE) ×3 IMPLANT
GOWN STRL REUS W/ TWL LRG LVL3 (GOWN DISPOSABLE) ×1 IMPLANT
GOWN STRL REUS W/ TWL XL LVL3 (GOWN DISPOSABLE) ×1 IMPLANT
GOWN STRL REUS W/TWL LRG LVL3 (GOWN DISPOSABLE) ×2
GOWN STRL REUS W/TWL XL LVL3 (GOWN DISPOSABLE) ×8 IMPLANT
GUIDEWIRE 0.038 PTFE COATED (WIRE) IMPLANT
GUIDEWIRE ANG ZIPWIRE 038X150 (WIRE) IMPLANT
GUIDEWIRE STR DUAL SENSOR (WIRE) ×3 IMPLANT
KIT RM TURNOVER CYSTO AR (KITS) ×3 IMPLANT
MANIFOLD NEPTUNE II (INSTRUMENTS) ×3 IMPLANT
NS IRRIG 500ML POUR BTL (IV SOLUTION) ×3 IMPLANT
PACK CYSTO (CUSTOM PROCEDURE TRAY) ×3 IMPLANT
STENT URET 6FRX24 CONTOUR (STENTS) ×3 IMPLANT
TUBE CONNECTING 12'X1/4 (SUCTIONS) ×1
TUBE CONNECTING 12X1/4 (SUCTIONS) ×2 IMPLANT

## 2017-07-01 NOTE — H&P (Signed)
CC: I am here for a post operative visit.  HPI: Valerie [name] is a 71 year-old female established patient who is here for a post operative visit.  "I'm feeling good."    The surgery she had done was cystourethroscopy, (R) RPG, double J stent placement. Her surgery was done 04/15/2017.   She has not had post operative nausea. She has not had vomiting. She has not had post operative fever. She has not had post operative chills. She does have a good appetite. BOWEL HABITS: her bowels are moving normally.     ALLERGIES: No Allergies    MEDICATIONS: Allegra 60 mg capsule Oral  Alprazolam 0.25 mg tablet Oral  Amlodipine Besylate 10 mg tablet Oral  Celecoxib  Escitalopram Oxalate 10 mg tablet  Fish Oil CAPS Oral  Flonase 50 mcg/actuation spray, suspension Nasal  Furosemide 20 mg tablet Oral  Humulin R 100 unit/ml vial Injection  Hydrochlorothiazide 12.5 mg tablet Oral  Lipitor 10 mg tablet Oral  Mucinex 600 mg tablet, extended release 12 hr Oral  Myrbetriq 50 mg tablet, extended release 24 hr Oral  Ondansetron Odt 4 mg tablet,disintegrating Oral  Oxymorphone Hcl 10 mg tablet  Pantoprazole Sodium 40 mg tablet, delayed release Oral  Tizanidine Hcl 2 mg tablet Oral     REVIEW OF SYSTEMS:    GU Review Female:   Patient denies frequent urination, hard to postpone urination, burning /pain with urination, get up at night to urinate, leakage of urine, stream starts and stops, trouble starting your stream, have to strain to urinate, and currently pregnant.  Gastrointestinal (Upper):   Patient denies nausea, vomiting, and indigestion/ heartburn.  Gastrointestinal (Lower):   Patient denies diarrhea and constipation.  Constitutional:   Patient denies fever, night sweats, weight loss, and fatigue.  Skin:   Patient denies skin rash/ lesion and itching.  Eyes:   Patient denies blurred vision and double vision.  Ears/ Nose/ Throat:   Patient denies sore throat and sinus problems.   Hematologic/Lymphatic:   Patient denies swollen glands and easy bruising.  Cardiovascular:   Patient denies leg swelling and chest pains.  Respiratory:   Patient denies cough and shortness of breath.  Endocrine:   Patient denies excessive thirst.  Musculoskeletal:   Patient reports back pain. Patient denies joint pain.  Neurological:   Patient denies headaches and dizziness.  Psychologic:   Patient denies depression and anxiety.   VITAL SIGNS:      05/12/2017 09:26 AM  Weight 239 lb / 108.41 kg  Height 64 in / 162.56 cm  BP 107/67 mmHg  Pulse 99 /min  Temperature 97.6 F / 36 C  BMI 41.0 kg/m   MULTI-SYSTEM PHYSICAL EXAMINATION:    Constitutional: Obese. No physical deformities. Normally developed. Good grooming.   Respiratory: No labored breathing, no use of accessory muscles.   Cardiovascular: Normal temperature, normal extremity pulses, no swelling, no varicosities.   Skin: No paleness, no jaundice, no cyanosis. No lesion, no ulcer, no rash.   Neurologic / Psychiatric: Oriented to time, oriented to place, oriented to person. No depression, no anxiety, no agitation.   Gastrointestinal: Obese abdomen. No mass, no tenderness, no rigidity.      PROCEDURES:          Urinalysis w/Scope - 81001 Dipstick Dipstick Cont'd Micro  Specimen: Voided Bilirubin: Neg WBC/hpf: >60/hpf  Color: Yellow Ketones: Neg RBC/hpf: 10 - 20/hpf  Appearance: Cloudy Blood: 3+ Bacteria: Many (>50/hpf)  Specific Gravity: 1.025 Protein: 2+ Cystals: NS (Not Seen)  pH: 5.5 Urobilinogen: 2.0 Casts: NS (Not Seen)  Glucose: Neg Nitrites: Neg Trichomonas: Not Present    Leukocyte Esterase: 3+ Mucous: Present      Epithelial Cells: 0 - 5/hpf      Yeast: NS (Not Seen)      Sperm: Not Present    Notes: microscopic on urine not concentrated    ASSESSMENT:      ICD-10 Details  1 GU:   Gross hematuria - R31.0 Chronic - Discussed with Dr. Annabell HowellsWrenn. He recommends pt maintain chronic indwelling stent and will  schedule pt for cysto/stent exchange in July.   2   Hydronephrosis Unspec - N13.30 Right, Chronic - Discussed with Dr. Annabell HowellsWrenn. He recommends pt maintain chronic indwelling stent and will schedule pt for cysto/stent exchange in July. Culture urine. No ABX unless culture proven UTI.    PLAN:           Orders Labs Urine Culture          Document Letter(s):  Created for Patient: Clinical Summary    E & M CODE: I spent at least 15 minutes face to face with the patient, more than 50% of that time was spent on counseling and/or coordinating care.

## 2017-07-01 NOTE — Discharge Instructions (Addendum)
CYSTOSCOPY HOME CARE INSTRUCTIONS  Activity: Rest for the remainder of the day.  Do not drive or operate equipment today.  You may resume normal activities in one to two days as instructed by your physician.   Meals: Drink plenty of liquids and eat light foods such as gelatin or soup this evening.  You may return to a normal meal plan tomorrow.  Return to Work: You may return to work in one to two days or as instructed by your physician.  Special Instructions / Symptoms: Call your physician if any of these symptoms occur:   -persistent or heavy bleeding  -bleeding which continues after first few urination  -large blood clots that are difficult to pass  -urine stream diminishes or stops completely  -fever equal to or higher than 101 degrees Farenheit.  -cloudy urine with a strong, foul odor  -severe pain  Females should always wipe from front to back after elimination.  You may feel some burning pain when you urinate.  This should disappear with time.  Applying moist heat to the lower abdomen or a hot tub bath may help relieve the pain. \     Post Anesthesia Home Care Instructions  Activity: Get plenty of rest for the remainder of the day. A responsible individual must stay with you for 24 hours following the procedure.  For the next 24 hours, DO NOT: -Drive a car -Operate machinery -Drink alcoholic beverages -Take any medication unless instructed by your physician -Make any legal decisions or sign important papers.  Meals: Start with liquid foods such as gelatin or soup. Progress to regular foods as tolerated. Avoid greasy, spicy, heavy foods. If nausea and/or vomiting occur, drink only clear liquids until the nausea and/or vomiting subsides. Call your physician if vomiting continues.  Special Instructions/Symptoms: Your throat may feel dry or sore from the anesthesia or the breathing tube placed in your throat during surgery. If this causes discomfort, gargle with warm salt  water. The discomfort should disappear within 24 hours.  If you had a scopolamine patch placed behind your ear for the management of post- operative nausea and/or vomiting:  1. The medication in the patch is effective for 72 hours, after which it should be removed.  Wrap patch in a tissue and discard in the trash. Wash hands thoroughly with soap and water. 2. You may remove the patch earlier than 72 hours if you experience unpleasant side effects which may include dry mouth, dizziness or visual disturbances. 3. Avoid touching the patch. Wash your hands with soap and water after contact with the patch.     

## 2017-07-01 NOTE — Anesthesia Postprocedure Evaluation (Signed)
Anesthesia Post Note  Patient: Valerie [name]  Procedure(s) Performed: Procedure(s) (LRB): CYSTOSCOPY WITH RIGHT STENT EXCHANGE (Right)     Patient location during evaluation: PACU Anesthesia Type: General Level of consciousness: awake and alert Pain management: pain level controlled Vital Signs Assessment: post-procedure vital signs reviewed and stable Respiratory status: spontaneous breathing, nonlabored ventilation, respiratory function stable and patient connected to nasal cannula oxygen Cardiovascular status: blood pressure returned to baseline and stable Postop Assessment: no signs of nausea or vomiting Anesthetic complications: no    Last Vitals:  Vitals:   07/01/17 1157 07/01/17 1200  BP: 135/69 135/61  Pulse: 77 79  Resp: 17 (!) 29  Temp: 36.8 C     Last Pain:  Vitals:   07/01/17 1157  TempSrc:   PainSc: Asleep                 Varick Keys

## 2017-07-01 NOTE — Anesthesia Procedure Notes (Signed)
Procedure Name: LMA Insertion Date/Time: 07/01/2017 11:30 AM Performed by: Renella CunasHAZEL, Darnisha Vernet D Pre-anesthesia Checklist: Patient identified, Emergency Drugs available, Suction available and Patient being monitored Patient Re-evaluated:Patient Re-evaluated prior to inductionOxygen Delivery Method: Circle system utilized Preoxygenation: Pre-oxygenation with 100% oxygen Intubation Type: IV induction Ventilation: Mask ventilation without difficulty LMA: LMA inserted LMA Size: 4.0 Number of attempts: 2 Airway Equipment and Method: Bite block Placement Confirmation: positive ETCO2 Tube secured with: Tape Dental Injury: Teeth and Oropharynx as per pre-operative assessment

## 2017-07-01 NOTE — Brief Op Note (Signed)
07/01/2017  11:43 AM  PATIENT:  Tyna JakschBeverly H Olmo  71 y.o. female  PRE-OPERATIVE DIAGNOSIS:  RIGHT HYDRONEPHROSIS  POST-OPERATIVE DIAGNOSIS:   RIGHT HYDRONEPHROSIS PROCEDURE:  Procedure(s): CYSTOSCOPY WITH STENT EXCHANGE (Right)  SURGEON:  Surgeon(s) and Role:    * Bjorn PippinWrenn, Miriam Liles, MD - Primary  PHYSICIAN ASSISTANT:   ASSISTANTS: none   ANESTHESIA:   general  EBL:  Total I/O In: 100 [I.V.:100] Out: -   BLOOD ADMINISTERED:none  DRAINS: 6 x 24 right JJ stent   LOCAL MEDICATIONS USED:  NONE  SPECIMEN:  No Specimen  DISPOSITION OF SPECIMEN:  N/A  COUNTS:  YES  TOURNIQUET:  * No tourniquets in log *  DICTATION: .Other Dictation: Dictation Number [id number]  PLAN OF CARE: Discharge to home after PACU  PATIENT DISPOSITION:  PACU - hemodynamically stable.   Delay start of Pharmacological VTE agent (>24hrs) due to surgical blood loss or risk of bleeding: not applicable

## 2017-07-01 NOTE — Transfer of Care (Signed)
Immediate Anesthesia Transfer of Care Note  Patient: Tyna JakschBeverly H Guimont  Procedure(s) Performed: Procedure(s) (LRB): CYSTOSCOPY WITH STENT EXCHANGE (Right)  Patient Location: PACU  Anesthesia Type: General  Level of Consciousness: awake, oriented, sedated and patient cooperative  Airway & Oxygen Therapy: Patient Spontanous Breathing and Patient connected to face mask oxygen  Post-op Assessment: Report given to PACU RN and Post -op Vital signs reviewed and stable  Post vital signs: Reviewed and stable  Complications: No apparent anesthesia complications  Last Vitals:  Vitals:   07/01/17 1001 07/01/17 1157  BP: 139/65   Pulse: 88 77  Resp: 20 17  Temp: 36.9 C 36.8 C    Last Pain:  Vitals:   07/01/17 1025  TempSrc:   PainSc: 3

## 2017-07-01 NOTE — Anesthesia Preprocedure Evaluation (Addendum)
Anesthesia Evaluation  Patient identified by MRN, date of birth, ID band Patient awake    Reviewed: Allergy & Precautions, NPO status , Patient's Chart, lab work & pertinent test results  Airway Mallampati: II  TM Distance: >3 FB Neck ROM: Full    Dental no notable dental hx. (+) Teeth Intact, Dental Advisory Given   Pulmonary neg pulmonary ROS, former smoker,    Pulmonary exam normal breath sounds clear to auscultation       Cardiovascular hypertension, Pt. on medications negative cardio ROS Normal cardiovascular exam+ dysrhythmias  Rhythm:Regular Rate:Normal     Neuro/Psych PSYCHIATRIC DISORDERS Anxiety negative neurological ROS  negative psych ROS   GI/Hepatic negative GI ROS, Neg liver ROS, GERD  Medicated,  Endo/Other  negative endocrine ROSdiabetes, Type 2, Insulin Dependent  Renal/GU CRFRenal diseasenegative Renal ROS  negative genitourinary   Musculoskeletal negative musculoskeletal ROS (+) Arthritis , Osteoarthritis,    Abdominal   Peds negative pediatric ROS (+)  Hematology negative hematology ROS (+) anemia ,   Anesthesia Other Findings EKG: Normal sinus rhythm Right bundle branch block Left axis deviation ( Left anterior fasicular block axis, but with LVH) Bifascicular block  Left ventricular hypertrophy  Reproductive/Obstetrics negative OB ROS                         Lab Results  Component Value Date   WBC 5.0 03/14/2016   HGB 12.9 04/15/2017   HCT 38.0 04/15/2017   MCV 89.7 03/14/2016   PLT 320 03/14/2016   Lab Results  Component Value Date   CREATININE 1.70 (H) 04/15/2017   BUN 51 (H) 04/15/2017   NA 137 04/15/2017   K 5.4 (H) 04/15/2017   CL 104 04/15/2017   CO2 20 (L) 03/14/2016   No results found for: INR, PROTIME  03/2017: EKG: normal sinus rhythm.  Anesthesia Physical  Anesthesia Plan  ASA: III  Anesthesia Plan: General   Post-op Pain Management:     Induction: Intravenous  PONV Risk Score and Plan: 2 and Ondansetron and Dexamethasone  Airway Management Planned: LMA  Additional Equipment:   Intra-op Plan:   Post-operative Plan: Extubation in OR  Informed Consent: I have reviewed the patients History and Physical, chart, labs and discussed the procedure including the risks, benefits and alternatives for the proposed anesthesia with the patient or authorized representative who has indicated his/her understanding and acceptance.   Dental advisory given  Plan Discussed with: CRNA and Surgeon  Anesthesia Plan Comments: ( )        Anesthesia Quick Evaluation

## 2017-07-02 ENCOUNTER — Encounter (HOSPITAL_BASED_OUTPATIENT_CLINIC_OR_DEPARTMENT_OTHER): Payer: Medicare Other | Attending: Internal Medicine

## 2017-07-02 ENCOUNTER — Encounter (HOSPITAL_BASED_OUTPATIENT_CLINIC_OR_DEPARTMENT_OTHER): Payer: Self-pay | Admitting: Urology

## 2017-07-02 DIAGNOSIS — E1140 Type 2 diabetes mellitus with diabetic neuropathy, unspecified: Secondary | ICD-10-CM | POA: Insufficient documentation

## 2017-07-02 DIAGNOSIS — T8189XA Other complications of procedures, not elsewhere classified, initial encounter: Secondary | ICD-10-CM | POA: Insufficient documentation

## 2017-07-02 DIAGNOSIS — Y838 Other surgical procedures as the cause of abnormal reaction of the patient, or of later complication, without mention of misadventure at the time of the procedure: Secondary | ICD-10-CM | POA: Insufficient documentation

## 2017-07-02 DIAGNOSIS — I10 Essential (primary) hypertension: Secondary | ICD-10-CM | POA: Insufficient documentation

## 2017-07-02 NOTE — Op Note (Signed)
NAMVanessa [name]:  Petrucelli, Lucrecia               ACCOUNT NO.:  [id number]  MEDICAL RECORD NO.:  [id number]  LOCATION:                                 FACILITY:  PHYSICIAN:  Excell SeltzerJohn J. Annabell HowellsWrenn, M.D.         DATE OF BIRTH:  DATE OF PROCEDURE:  07/01/2017 DATE OF DISCHARGE:                              OPERATIVE REPORT   PROCEDURE:  Cystoscopy with removal of right double-J stent and replacement of right double-J stent.  PREOPERATIVE DIAGNOSIS:  History of chronic right hydronephrosis and renal inflammation.  POSTOPERATIVE DIAGNOSIS:  History of chronic right hydronephrosis and renal inflammation.  SURGEON:  Excell SeltzerJohn J. Annabell HowellsWrenn, M.D.  ANESTHESIA:  General.  SPECIMEN:  None.  DRAINS:  A 6-French x 24-cm right double-J stent.  BLOOD LOSS:  None.  COMPLICATIONS:  None.  INDICATIONS:  Meriam SpragueBeverly is a 71 year old white female with a history of chronic right hydro with renal inflammation, who is managed with chronic stent changes.  She returns now for stent exchange.  FINDINGS AND PROCEDURE:  She was given Cipro.  She was taken to the operating room where general anesthetic was induced.  She was placed in lithotomy position.  Her perineum and genitalia were prepped with Betadine solution and she was draped in usual sterile fashion.  Cystoscopy was performed using the 23-French scope and 30-degree lens. Examination revealed the normal urethra.  The bladder wall was smooth and pale.  The ureteral orifices were in the normal anatomic position. The left was unremarkable, the right had a stent loop present with minimal incrustation.  The urine was somewhat turbid.  The stent was grasped with a grasping forceps and pulled the urethral meatus.  A Sensor guidewire was then passed to the kidney through the old stent, which was then removed.  A fresh 6-French x 24-cm Contour double-J stent was inserted over the wire to the kidney under fluoroscopic guidance.  The wire was removed leaving good coil in the  kidney and good coil in the bladder.  The bladder was drained.  The cystoscope was removed.  The patient was taken down from lithotomy position.  Her anesthetic was reversed.  She was moved to the recovery room in stable condition.  There were no complications.     Excell SeltzerJohn J. Annabell HowellsWrenn, M.D.   ______________________________ Excell SeltzerJohn J. Annabell HowellsWrenn, M.D.    JJW/MEDQ  D:  07/01/2017  T:  07/01/2017  Job:  [id number]

## 2017-07-05 DIAGNOSIS — I10 Essential (primary) hypertension: Secondary | ICD-10-CM | POA: Diagnosis not present

## 2017-07-05 DIAGNOSIS — T8189XA Other complications of procedures, not elsewhere classified, initial encounter: Secondary | ICD-10-CM | POA: Diagnosis present

## 2017-07-05 DIAGNOSIS — Y838 Other surgical procedures as the cause of abnormal reaction of the patient, or of later complication, without mention of misadventure at the time of the procedure: Secondary | ICD-10-CM | POA: Diagnosis not present

## 2017-07-05 DIAGNOSIS — E1140 Type 2 diabetes mellitus with diabetic neuropathy, unspecified: Secondary | ICD-10-CM | POA: Diagnosis not present

## 2017-07-05 NOTE — Addendum Note (Signed)
Addendum  created 07/05/17 1032 by Bethena Midgetddono, Ilai Hiller, MD   Pend clinical note

## 2017-07-09 NOTE — Addendum Note (Signed)
Addendum  created 07/09/17 1307 by Bethena Midgetddono, Sabriyah Wilcher, MD   Sign clinical note

## 2017-08-09 ENCOUNTER — Encounter (HOSPITAL_BASED_OUTPATIENT_CLINIC_OR_DEPARTMENT_OTHER): Payer: Medicare Other | Attending: Internal Medicine

## 2017-08-09 DIAGNOSIS — I10 Essential (primary) hypertension: Secondary | ICD-10-CM | POA: Diagnosis not present

## 2017-08-09 DIAGNOSIS — T8189XA Other complications of procedures, not elsewhere classified, initial encounter: Secondary | ICD-10-CM | POA: Insufficient documentation

## 2017-08-09 DIAGNOSIS — E1140 Type 2 diabetes mellitus with diabetic neuropathy, unspecified: Secondary | ICD-10-CM | POA: Insufficient documentation

## 2017-08-09 DIAGNOSIS — Y838 Other surgical procedures as the cause of abnormal reaction of the patient, or of later complication, without mention of misadventure at the time of the procedure: Secondary | ICD-10-CM | POA: Insufficient documentation

## 2017-08-09 DIAGNOSIS — E119 Type 2 diabetes mellitus without complications: Secondary | ICD-10-CM | POA: Diagnosis not present

## 2017-08-23 DIAGNOSIS — T8189XA Other complications of procedures, not elsewhere classified, initial encounter: Secondary | ICD-10-CM | POA: Diagnosis not present

## 2017-08-25 ENCOUNTER — Encounter: Payer: Self-pay | Admitting: Podiatry

## 2017-08-25 ENCOUNTER — Ambulatory Visit (INDEPENDENT_AMBULATORY_CARE_PROVIDER_SITE_OTHER): Payer: Medicare Other | Admitting: Podiatry

## 2017-08-25 DIAGNOSIS — M79676 Pain in unspecified toe(s): Secondary | ICD-10-CM | POA: Diagnosis not present

## 2017-08-25 DIAGNOSIS — B351 Tinea unguium: Secondary | ICD-10-CM | POA: Diagnosis not present

## 2017-08-25 DIAGNOSIS — M2040 Other hammer toe(s) (acquired), unspecified foot: Secondary | ICD-10-CM

## 2017-08-25 DIAGNOSIS — E1142 Type 2 diabetes mellitus with diabetic polyneuropathy: Secondary | ICD-10-CM

## 2017-08-25 NOTE — Progress Notes (Signed)
Complaint:  Visit Type: Patient returns to my office for continued preventative foot care services. Complaint: Patient states" my nails have grown long and thick and become painful to walk and wear shoes" Patient has been diagnosed with DM with neuropathy.. The patient presents for preventative foot care services. No changes to ROS  Podiatric Exam: Vascular: dorsalis pedis and posterior tibial pulses are palpable bilateral. Capillary return is immediate. Temperature gradient is WNL. Skin turgor WNL  Sensorium: Absent LOPS in toes.  LOPS in foot diminished . Normal tactile sensation bilaterally. Nail Exam: Pt has thick disfigured discolored nails with subungual debris noted bilateral entire nail hallux through fifth toenails Ulcer Exam: There is no evidence of ulcer or pre-ulcerative changes or infection. Orthopedic Exam: Muscle tone and strength are WNL. No limitations in general ROM. No crepitus or effusions noted. Hammer toes  B/L. Bony prominences are unremarkable. Skin: No Porokeratosis. No infection or ulcers.  Multiple hematomas noted on second and third toes  B/L  .No infections noted.    Diagnosis:  Onychomycosis, , Pain in right toe, pain in left toes  Treatment & Plan Procedures and Treatment: Consent by patient was obtained for treatment procedures. The patient understood the discussion of treatment and procedures well. All questions were answered thoroughly reviewed. Debridement of mycotic and hypertrophic toenails, 1 through 5 bilateral and clearing of subungual debris. No ulceration, no infection noted. Patient does qualify for diabetic shoes in future. Return Visit-Office Procedure: Patient instructed to return to the office for a follow up visit 3 months for continued evaluation and treatment.    Helane GuntherGregory Kalila Adkison DPM

## 2017-09-06 ENCOUNTER — Encounter (HOSPITAL_BASED_OUTPATIENT_CLINIC_OR_DEPARTMENT_OTHER): Payer: Medicare Other | Attending: Internal Medicine

## 2017-09-06 DIAGNOSIS — T8189XA Other complications of procedures, not elsewhere classified, initial encounter: Secondary | ICD-10-CM | POA: Diagnosis not present

## 2017-09-06 DIAGNOSIS — Y838 Other surgical procedures as the cause of abnormal reaction of the patient, or of later complication, without mention of misadventure at the time of the procedure: Secondary | ICD-10-CM | POA: Diagnosis not present

## 2017-09-06 DIAGNOSIS — I10 Essential (primary) hypertension: Secondary | ICD-10-CM | POA: Diagnosis not present

## 2017-09-06 DIAGNOSIS — E1140 Type 2 diabetes mellitus with diabetic neuropathy, unspecified: Secondary | ICD-10-CM | POA: Insufficient documentation

## 2017-09-06 LAB — GLUCOSE, CAPILLARY: Glucose-Capillary: 74 mg/dL (ref 65–99)

## 2017-09-21 ENCOUNTER — Other Ambulatory Visit: Payer: Self-pay | Admitting: Urology

## 2017-09-27 ENCOUNTER — Encounter (HOSPITAL_BASED_OUTPATIENT_CLINIC_OR_DEPARTMENT_OTHER): Payer: Medicare Other | Attending: Internal Medicine

## 2017-09-27 DIAGNOSIS — I10 Essential (primary) hypertension: Secondary | ICD-10-CM | POA: Insufficient documentation

## 2017-09-27 DIAGNOSIS — T8189XA Other complications of procedures, not elsewhere classified, initial encounter: Secondary | ICD-10-CM | POA: Insufficient documentation

## 2017-09-27 DIAGNOSIS — Y838 Other surgical procedures as the cause of abnormal reaction of the patient, or of later complication, without mention of misadventure at the time of the procedure: Secondary | ICD-10-CM | POA: Insufficient documentation

## 2017-09-27 DIAGNOSIS — G629 Polyneuropathy, unspecified: Secondary | ICD-10-CM | POA: Insufficient documentation

## 2017-10-04 DIAGNOSIS — G629 Polyneuropathy, unspecified: Secondary | ICD-10-CM | POA: Diagnosis not present

## 2017-10-04 DIAGNOSIS — T8189XA Other complications of procedures, not elsewhere classified, initial encounter: Secondary | ICD-10-CM | POA: Diagnosis not present

## 2017-10-04 DIAGNOSIS — I10 Essential (primary) hypertension: Secondary | ICD-10-CM | POA: Diagnosis not present

## 2017-10-04 DIAGNOSIS — Y838 Other surgical procedures as the cause of abnormal reaction of the patient, or of later complication, without mention of misadventure at the time of the procedure: Secondary | ICD-10-CM | POA: Diagnosis not present

## 2017-10-18 DIAGNOSIS — T8189XA Other complications of procedures, not elsewhere classified, initial encounter: Secondary | ICD-10-CM | POA: Diagnosis not present

## 2017-10-19 ENCOUNTER — Encounter (HOSPITAL_BASED_OUTPATIENT_CLINIC_OR_DEPARTMENT_OTHER): Payer: Self-pay | Admitting: *Deleted

## 2017-10-19 NOTE — Progress Notes (Signed)
Npo after midnight, take amolidipine, atorvastatin, escitalopram, myrbetriq, nasonex nasdal spray prn, no insulin am 10-28-17 sip on water, arrive 715 am [facility], husband driver, ekg [id number] epic and on chart needs I stat 4.

## 2017-10-27 NOTE — H&P (Signed)
CC: Problems/Symptoms  HPI: Valerie [name] is a 71 year-old female established patient who is here related to problem/symptoms noted below.  Denies any voiced complaints at this time. Denies f/c, n/v, chest pain. or SOB.    Her presenting problem is Seen today for a preop visit. . Her problem is located in kidney and back. Associated symptoms do not occur at the same time as the symptoms. The problem is throbbing. The symptoms are aggravated or occur with a specific activity. The activity includes walking.     ALLERGIES: None   MEDICATIONS: Keflex 500 mg capsule 1 capsule PO Q HS  Alprazolam 0.25 mg tablet Oral  Amlodipine Besylate 10 mg tablet Oral  Celecoxib  Escitalopram Oxalate 10 mg tablet  Fish Oil CAPS Oral  Flonase 50 mcg/actuation spray, suspension Nasal  Furosemide 20 mg tablet Oral  Humulin R 100 unit/ml vial Injection  Hydrochlorothiazide 12.5 mg tablet Oral  Lipitor 10 mg tablet Oral  Mucinex 600 mg tablet, extended release 12 hr Oral  Myrbetriq 50 mg tablet, extended release 24 hr Oral  Ondansetron Odt 4 mg tablet,disintegrating Oral  Oxymorphone Hcl Er 30 mg tablet, extended release 12 hr  Pantoprazole Sodium 40 mg tablet, delayed release Oral  Tizanidine Hcl 2 mg tablet Oral     Notes: She has increased the oxymorphone for her back.   GU PSH: Cysto Uretero Biopsy Fulgura - 04/24/2016 Cystoscopy Insert Stent, Right - 07/01/2017, Right - 04/15/2017, 04/24/2016, 2017 Cystoscopy Ureteroscopy, Right - 04/15/2017, 2017 Locm 300-399Mg /Ml Iodine,1Ml - 04/02/2017 Rpr Umbil Hern; Reduc < 5 Yr - 2017      PSH Notes: Epidural injections 2017   NON-GU PSH: Laparoscopy, Surgical; Repair Umbilical Hernia Pancreas Surgery (Unspecified) - 2017 Small bowel resection - 2017    GU PMH: Hydronephrosis Unspec, She is due for a stent change in the next 4-6 weeks. I will get that set up. - 09/15/2017, She is doing well post stent exchanged. I will have her return in 3 months to arrange  the next exchange. , - 07/14/2017 (Stable), Right, - 08/24/2016 (Improving), There is minimal residual right hydro noted on [place]. , - 07/03/2016, Hydronephrosis, right, - 05/27/2016 Urinary Urgency, Her frequency and urgency are well controlled on Myrbetriq. - 07/14/2017 Gross hematuria, She has gross hematuria that appears to be originating in the right kidney and she has chronic inflammation and obstruction of that kidney, I am going to get a cytology today and will get her set up for ureteroscopy and stenting to better evaluate the collecting system. With CKD3 I think it would be best if she is able to avoid right nephrectomy. I have reviewed the risks of the procedure in detail including bleeding, infection, ureteral injury, need for a stent and secondary procedures, thrombotic events and anesthetic complications. - 04/07/2017, (Acute), Culture urine. Will continue with daily suppression Cephalexin until culture finalized. If she has UTI now resistant to Cephalexin will change suppression. Will proceed with hematuria workup with CT and possible cysto , - 04/01/2017, Gross hematuria, - 2017 Proteinuria (Acute), With new onset Proteinuria in pt with diabetes and hypertension may need nephrology referral. Will check BUN/creatine today - 04/01/2017 Chronic cystitis (w/o hematuria) (Stable), Her UA looks infected but she just came of our antibiotic and is on amoxicillin. She is free of symptoms. - 07/03/2016, Chronic cystitis, - 05/27/2016 Urinary Tract Inf, Unspec site, Pyuria - 04/27/2016 Incomplete bladder emptying, Feeling of incomplete bladder emptying - 2017 Postmenopausal atrophic vaginitis, Atrophic vaginitis - 2017  NON-GU PMH: Anxiety, Anxiety - 05/20/2016 Encounter for general adult medical examination without abnormal findings, Encounter for preventive health examination - 20-May-2016 Personal history of other diseases of the circulatory system, History of hypertension - 05/20/2016 Personal history of other diseases of the  digestive system, History of small bowel obstruction - 05/20/16, History of pancreatitis, - 2016-05-20, History of esophageal reflux, - 05-20-2016 Personal history of other diseases of the musculoskeletal system and connective tissue, History of arthritis - 05/20/16 Personal history of other endocrine, nutritional and metabolic disease, History of hypercholesterolemia - 05-20-16, History of diabetes mellitus, - 20-May-2016    FAMILY HISTORY: Death of family member - Runs In Family Prostate Cancer - Runs In Family   SOCIAL HISTORY: Marital Status: Married Preferred Language: English; Ethnicity: Not Hispanic Or Latino; Race: White Current Smoking Status: Patient does not smoke anymore.  Does not use smokeless tobacco. Has never drank.  Does not use drugs. Drinks 1 caffeinated drink per day.    REVIEW OF SYSTEMS:    GU Review Female:   Patient denies frequent urination, hard to postpone urination, burning /pain with urination, get up at night to urinate, leakage of urine, stream starts and stops, trouble starting your stream, have to strain to urinate, and being pregnant.  Gastrointestinal (Upper):   Patient denies nausea, vomiting, and indigestion/ heartburn.  Gastrointestinal (Lower):   Patient denies diarrhea and constipation.  Constitutional:   Patient denies fever, night sweats, weight loss, and fatigue.  Skin:   Patient denies skin rash/ lesion and itching.  Eyes:   Patient denies blurred vision and double vision.  Ears/ Nose/ Throat:   Patient reports sinus problems. Patient denies sore throat.  Hematologic/Lymphatic:   Patient denies swollen glands and easy bruising.  Cardiovascular:   Patient denies leg swelling and chest pains.  Respiratory:   Patient reports shortness of breath. Patient denies cough.  Endocrine:   Patient reports excessive thirst.   Musculoskeletal:   Patient reports back pain. Patient denies joint pain.  Neurological:   Patient reports dizziness. Patient denies headaches.  Psychologic:    Patient reports anxiety. Patient denies depression.   VITAL SIGNS:      10/19/2017 09:48 AM  Weight 235 lb / 106.59 kg  Height 64 in / 162.56 cm  BP 132/79 mmHg  Pulse 87 /min  Temperature 98.1 F / 36.7 C  BMI 40.3 kg/m   MULTI-SYSTEM PHYSICAL EXAMINATION:    Constitutional: Obese. No physical deformities. Normally developed. Good grooming.   Respiratory: Normal breath sounds. No labored breathing, no use of accessory muscles.   Cardiovascular: Regular rate and rhythm. No murmur, no gallop. Extremity swelling BLE +1 pitting. Normal temperature, normal extremity pulses, no varicosities.   Skin: No paleness, no jaundice, no cyanosis. No lesion, no ulcer, no rash.   Neurologic / Psychiatric: Oriented to time, oriented to place, oriented to person. No depression, no anxiety, no agitation.   Gastrointestinal: Obese abdomen. No mass, no tenderness, no rigidity.   Musculoskeletal: Normal gait and station of head and neck. Ambulates with walker     PAST DATA REVIEWED:  Source Of History:  Patient  Records Review:   Previous Patient Records  Urine Test Review:   Urinalysis, Urine Culture   PROCEDURES:          Urinalysis w/Scope - 81001 Dipstick Dipstick Cont'd Micro  Specimen: Voided Bilirubin: Neg WBC/hpf: 10 - 20/hpf  Color: Yellow Ketones: Neg RBC/hpf: >60/hpf  Appearance: Cloudy Blood: 3+ Bacteria: Rare (0-9/hpf)  Specific Gravity: 1.015  Protein: 3+ Cystals: NS (Not Seen)  pH: 6.0 Urobilinogen: 0.2 Casts: NS (Not Seen)  Glucose: Neg Nitrites: Neg Trichomonas: Not Present    Leukocyte Esterase: Trace Mucous: Not Present      Epithelial Cells: 6 - 10/hpf      Yeast: NS (Not Seen)      Sperm: Not Present    ASSESSMENT:      ICD-10 Details  1 GU:   Hydronephrosis Unspec - N13.30 Right, Chronic - Culture urine. No ABX unless culture proven UTI. Cleared to proceed with upcoming surgical procedure   PLAN:           Orders Labs Urine Culture   NG on 10/19/17

## 2017-10-28 ENCOUNTER — Ambulatory Visit (HOSPITAL_BASED_OUTPATIENT_CLINIC_OR_DEPARTMENT_OTHER)
Admission: RE | Admit: 2017-10-28 | Discharge: 2017-10-28 | Disposition: A | Discharge status: Home / Self Care | Payer: Medicare Other | Source: Ambulatory Visit | Attending: Urology | Admitting: Urology

## 2017-10-28 ENCOUNTER — Ambulatory Visit (HOSPITAL_BASED_OUTPATIENT_CLINIC_OR_DEPARTMENT_OTHER): Payer: Medicare Other | Admitting: Anesthesiology

## 2017-10-28 ENCOUNTER — Encounter (HOSPITAL_BASED_OUTPATIENT_CLINIC_OR_DEPARTMENT_OTHER)
Admission: RE | Disposition: A | Discharge status: Home / Self Care | Payer: Self-pay | Source: Ambulatory Visit | Attending: Urology

## 2017-10-28 ENCOUNTER — Encounter (HOSPITAL_BASED_OUTPATIENT_CLINIC_OR_DEPARTMENT_OTHER): Payer: Self-pay | Admitting: *Deleted

## 2017-10-28 DIAGNOSIS — N1330 Unspecified hydronephrosis: Secondary | ICD-10-CM | POA: Insufficient documentation

## 2017-10-28 DIAGNOSIS — Z794 Long term (current) use of insulin: Secondary | ICD-10-CM | POA: Insufficient documentation

## 2017-10-28 DIAGNOSIS — E119 Type 2 diabetes mellitus without complications: Secondary | ICD-10-CM | POA: Insufficient documentation

## 2017-10-28 DIAGNOSIS — Z79899 Other long term (current) drug therapy: Secondary | ICD-10-CM | POA: Insufficient documentation

## 2017-10-28 DIAGNOSIS — F419 Anxiety disorder, unspecified: Secondary | ICD-10-CM | POA: Insufficient documentation

## 2017-10-28 DIAGNOSIS — Z87891 Personal history of nicotine dependence: Secondary | ICD-10-CM | POA: Diagnosis not present

## 2017-10-28 DIAGNOSIS — E7800 Pure hypercholesterolemia, unspecified: Secondary | ICD-10-CM | POA: Diagnosis not present

## 2017-10-28 HISTORY — PX: CYSTOSCOPY W/ URETERAL STENT PLACEMENT: SHX1429

## 2017-10-28 LAB — GLUCOSE, CAPILLARY: Glucose-Capillary: 110 mg/dL — ABNORMAL HIGH (ref 65–99)

## 2017-10-28 LAB — POCT I-STAT 4, (NA,K, GLUC, HGB,HCT)
Glucose, Bld: 92 mg/dL (ref 65–99)
HEMATOCRIT: 33 % — AB (ref 36.0–46.0)
HEMOGLOBIN: 11.2 g/dL — AB (ref 12.0–15.0)
POTASSIUM: 4.9 mmol/L (ref 3.5–5.1)
SODIUM: 141 mmol/L (ref 135–145)

## 2017-10-28 SURGERY — CYSTOSCOPY, FLEXIBLE, WITH STENT REPLACEMENT
Anesthesia: General | Laterality: Right

## 2017-10-28 MED ORDER — CEFAZOLIN SODIUM-DEXTROSE 2-4 GM/100ML-% IV SOLN
2.0000 g | INTRAVENOUS | Status: AC
  Administered 2017-10-28: 2 g via INTRAVENOUS
  Filled 2017-10-28: qty 100

## 2017-10-28 MED ORDER — LACTATED RINGERS IV SOLN
INTRAVENOUS | Status: DC
  Administered 2017-10-28 (×2): via INTRAVENOUS
  Filled 2017-10-28: qty 1000

## 2017-10-28 MED ORDER — KETOROLAC TROMETHAMINE 30 MG/ML IJ SOLN
INTRAMUSCULAR | Status: DC | PRN
  Administered 2017-10-28: 30 mg via INTRAVENOUS

## 2017-10-28 MED ORDER — LIDOCAINE 2% (20 MG/ML) 5 ML SYRINGE
INTRAMUSCULAR | Status: DC | PRN
  Administered 2017-10-28: 25 mg via INTRAVENOUS
  Administered 2017-10-28: 80 mg via INTRAVENOUS

## 2017-10-28 MED ORDER — PROPOFOL 10 MG/ML IV BOLUS
INTRAVENOUS | Status: AC
  Filled 2017-10-28: qty 20

## 2017-10-28 MED ORDER — ONDANSETRON HCL 4 MG/2ML IJ SOLN
INTRAMUSCULAR | Status: DC | PRN
  Administered 2017-10-28: 4 mg via INTRAVENOUS

## 2017-10-28 MED ORDER — ONDANSETRON HCL 4 MG/2ML IJ SOLN
INTRAMUSCULAR | Status: AC
  Filled 2017-10-28: qty 2

## 2017-10-28 MED ORDER — LIDOCAINE 2% (20 MG/ML) 5 ML SYRINGE
INTRAMUSCULAR | Status: AC
  Filled 2017-10-28: qty 5

## 2017-10-28 MED ORDER — FENTANYL CITRATE (PF) 100 MCG/2ML IJ SOLN
INTRAMUSCULAR | Status: AC
  Filled 2017-10-28: qty 2

## 2017-10-28 MED ORDER — KETOROLAC TROMETHAMINE 30 MG/ML IJ SOLN
INTRAMUSCULAR | Status: AC
  Filled 2017-10-28: qty 1

## 2017-10-28 MED ORDER — CEFAZOLIN SODIUM-DEXTROSE 2-4 GM/100ML-% IV SOLN
INTRAVENOUS | Status: AC
  Filled 2017-10-28: qty 100

## 2017-10-28 MED ORDER — PROMETHAZINE HCL 25 MG/ML IJ SOLN
6.2500 mg | INTRAMUSCULAR | Status: DC | PRN
  Filled 2017-10-28: qty 1

## 2017-10-28 MED ORDER — DEXAMETHASONE SODIUM PHOSPHATE 4 MG/ML IJ SOLN
INTRAMUSCULAR | Status: DC | PRN
  Administered 2017-10-28: 10 mg via INTRAVENOUS

## 2017-10-28 MED ORDER — DEXAMETHASONE SODIUM PHOSPHATE 10 MG/ML IJ SOLN
INTRAMUSCULAR | Status: AC
  Filled 2017-10-28: qty 1

## 2017-10-28 MED ORDER — FENTANYL CITRATE (PF) 100 MCG/2ML IJ SOLN
INTRAMUSCULAR | Status: DC | PRN
  Administered 2017-10-28 (×4): 25 ug via INTRAVENOUS

## 2017-10-28 MED ORDER — PROPOFOL 10 MG/ML IV BOLUS
INTRAVENOUS | Status: DC | PRN
  Administered 2017-10-28: 150 mg via INTRAVENOUS

## 2017-10-28 SURGICAL SUPPLY — 19 items
BAG DRAIN URO-CYSTO SKYTR STRL (DRAIN) ×3 IMPLANT
CATH URET 5FR 28IN CONE TIP (BALLOONS)
CATH URET 5FR 28IN OPEN ENDED (CATHETERS) IMPLANT
CATH URET 5FR 70CM CONE TIP (BALLOONS) IMPLANT
CLOTH BEACON ORANGE TIMEOUT ST (SAFETY) ×3 IMPLANT
GLOVE SURG SS PI 8.0 STRL IVOR (GLOVE) ×3 IMPLANT
GOWN STRL REUS W/ TWL XL LVL3 (GOWN DISPOSABLE) ×1 IMPLANT
GOWN STRL REUS W/TWL XL LVL3 (GOWN DISPOSABLE) ×2
GUIDEWIRE 0.038 PTFE COATED (WIRE) IMPLANT
GUIDEWIRE ANG ZIPWIRE 038X150 (WIRE) IMPLANT
GUIDEWIRE STR DUAL SENSOR (WIRE) ×3 IMPLANT
IV NS IRRIG 3000ML ARTHROMATIC (IV SOLUTION) ×3 IMPLANT
KIT RM TURNOVER CYSTO AR (KITS) ×3 IMPLANT
MANIFOLD NEPTUNE II (INSTRUMENTS) ×3 IMPLANT
NS IRRIG 500ML POUR BTL (IV SOLUTION) IMPLANT
PACK CYSTO (CUSTOM PROCEDURE TRAY) ×3 IMPLANT
STENT URET 6FRX24 CONTOUR (STENTS) ×3 IMPLANT
TUBE CONNECTING 12'X1/4 (SUCTIONS) ×1
TUBE CONNECTING 12X1/4 (SUCTIONS) ×2 IMPLANT

## 2017-10-28 NOTE — Anesthesia Postprocedure Evaluation (Signed)
Anesthesia Post Note  Patient: Tyna JakschBeverly H Cliff  Procedure(s) Performed: CYSTOSCOPY WITH STENT REPLACEMENT (Right )     Patient location during evaluation: PACU Anesthesia Type: General Level of consciousness: awake and alert Pain management: pain level controlled Vital Signs Assessment: post-procedure vital signs reviewed and stable Respiratory status: spontaneous breathing, nonlabored ventilation, respiratory function stable and patient connected to nasal cannula oxygen Cardiovascular status: blood pressure returned to baseline and stable Postop Assessment: no apparent nausea or vomiting Anesthetic complications: no    Last Vitals:  Vitals:   10/28/17 1100 10/28/17 1115  BP: 113/73   Pulse: 72 74  Resp: 20 17  Temp:    SpO2: 100% 91%    Last Pain:  Vitals:   10/28/17 0817  TempSrc: Oral                 Theora Vankirk S

## 2017-10-28 NOTE — Interval H&P Note (Signed)
History and Physical Interval Note:  10/28/2017 9:13 AM  Valerie [name]  has presented today for surgery, with the diagnosis of RIGHT HYDRONEPHROSIS  The various methods of treatment have been discussed with the patient and family. After consideration of risks, benefits and other options for treatment, the patient has consented to  Procedure(s): CYSTOSCOPY WITH STENT REPLACEMENT (Right) as a surgical intervention .  The patient's history has been reviewed, patient examined, no change in status, stable for surgery.  I have reviewed the patient's chart and labs.  Questions were answered to the patient's satisfaction.     Eisen Robenson J

## 2017-10-28 NOTE — Discharge Instructions (Signed)
Ureteral Stent Implantation, Care After Refer to this sheet in the next few weeks. These instructions provide you with information about caring for yourself after your procedure. Your health care provider may also give you more specific instructions. Your treatment has been planned according to current medical practices, but problems sometimes occur. Call your health care provider if you have any problems or questions after your procedure. What can I expect after the procedure? After the procedure, it is common to have:  Nausea.  Mild pain when you urinate. You may feel this pain in your lower back or lower abdomen. Pain should stop within a few minutes after you urinate. This may last for up to 1 week.  A small amount of blood in your urine for several days.  Follow these instructions at home:  Medicines  Take over-the-counter and prescription medicines only as told by your health care provider.  If you were prescribed an antibiotic medicine, take it as told by your health care provider. Do not stop taking the antibiotic even if you start to feel better.  Do not drive for 24 hours if you received a sedative.  Do not drive or operate heavy machinery while taking prescription pain medicines. Activity  Return to your normal activities as told by your health care provider. Ask your health care provider what activities are safe for you.  Do not lift anything that is heavier than 10 lb (4.5 kg). Follow this limit for 1 week after your procedure, or for as long as told by your health care provider. General instructions  Watch for any blood in your urine. Call your health care provider if the amount of blood in your urine increases.  If you have a catheter: ? Follow instructions from your health care provider about taking care of your catheter and collection bag. ? Do not take baths, swim, or use a hot tub until your health care provider approves.  Drink enough fluid to keep your urine  clear or pale yellow.  Keep all follow-up visits as told by your health care provider. This is important.  Do not take any nonsteroidal anti inflammatories (Ibuprofen, Advil, Motrin, Aleve) until after 3:30 pm today Contact a health care provider if:  You have pain that gets worse or does not get better with medicine, especially pain when you urinate.  You have difficulty urinating.  You feel nauseous or you vomit repeatedly during a period of more than 2 days after the procedure. Get help right away if:  Your urine is dark red or has blood clots in it.  You are leaking urine (have incontinence).  The end of the stent comes out of your urethra.  You cannot urinate.  You have sudden, sharp, or severe pain in your abdomen or lower back.  You have a fever. This information is not intended to replace advice given to you by your health care provider. Make sure you discuss any questions you have with your health care provider. Document Released: 08/16/2013 Document Revised: 05/21/2016 Document Reviewed: 06/28/2015 Elsevier Interactive Patient Education  2018 [employer]    Post Anesthesia Home Care Instructions  Activity: Get plenty of rest for the remainder of the day. A responsible individual must stay with you for 24 hours following the procedure.  For the next 24 hours, DO NOT: -Drive a car -Advertising copywriter -Drink alcoholic beverages -Take any medication unless instructed by your physician -Make any legal decisions or sign important papers.  Meals: Start with liquid foods  such as gelatin or soup. Progress to regular foods as tolerated. Avoid greasy, spicy, heavy foods. If nausea and/or vomiting occur, drink only clear liquids until the nausea and/or vomiting subsides. Call your physician if vomiting continues.  Special Instructions/Symptoms: Your throat may feel dry or sore from the anesthesia or the breathing tube placed in your throat during surgery. If this causes  discomfort, gargle with warm salt water. The discomfort should disappear within 24 hours.  If you had a scopolamine patch placed behind your ear for the management of post- operative nausea and/or vomiting:  1. The medication in the patch is effective for 72 hours, after which it should be removed.  Wrap patch in a tissue and discard in the trash. Wash hands thoroughly with soap and water. 2. You may remove the patch earlier than 72 hours if you experience unpleasant side effects which may include dry mouth, dizziness or visual disturbances. 3. Avoid touching the patch. Wash your hands with soap and water after contact with the patch.   .Marland Kitchen

## 2017-10-28 NOTE — Op Note (Signed)
10/28/2017  9:44 AM  PATIENT:  Valerie [name]  71 y.o. female  PRE-OPERATIVE DIAGNOSIS:  RIGHT HYDRONEPHROSIS  POST-OPERATIVE DIAGNOSIS:  RIGHT HYDRONEPHROSIS  PROCEDURE:  Procedure(s): CYSTOSCOPY WITH STENT REPLACEMENT (Right)  SURGEON:  Surgeon(s) and Role:    * Bjorn PippinWrenn, Ashton Belote, MD - Primary  PHYSICIAN ASSISTANT:   ASSISTANTS: none   ANESTHESIA:   general  EBL:  0 mL   BLOOD ADMINISTERED:none  DRAINS: 6 x 24 right JJ stent   LOCAL MEDICATIONS USED:  NONE  SPECIMEN:  No Specimen  DISPOSITION OF SPECIMEN:  N/A  COUNTS:  YES  TOURNIQUET:  * No tourniquets in log *  DICTATION: .Dragon Dictation  INDICATION:  Right hydronephrosis managed with chronic stents.  PROCEDURE: Valerie [name] was taken to the operating room where she was given 2 g of Ancef.  A general anesthetic was induced.  She was placed in the lithotomy position and fitted with PAS hose.  Her perineum and genitalia were prepped with Betadine solution and she was draped in the usual sterile fashion.  Cystoscopy was performed using the 23 [place] scope and 30 degree lens.  Examination revealed a stent at the right ureteral orifice with some surrounding erythema and edema but no other bladder wall or ureteral abnormal.  The stent was grasped with a grasping forceps and pulled to the urethral meatus.  A sensor guidewire was passed through the stent to the kidney and the stent was removed.  A fresh 6 [place] by 24 cm contour double-J stent was passed over the wire to the kidney under fluoroscopic guidance.  The bladder was drained and the cystoscope was removed.  She was taken down from the lithotomy position and moved to the recovery room in stable condition after reversal of her anesthetic.  There were no complications.    PLAN OF CARE: Discharge to home after PACU  PATIENT DISPOSITION:  PACU - hemodynamically stable.   Delay start of Pharmacological VTE agent (>24hrs) due to surgical blood loss or risk of  bleeding: not applicable

## 2017-10-28 NOTE — Anesthesia Preprocedure Evaluation (Signed)
Anesthesia Evaluation  Patient identified by MRN, date of birth, ID band Patient awake    Reviewed: Allergy & Precautions, NPO status , Patient's Chart, lab work & pertinent test results  Airway Mallampati: III  TM Distance: >3 FB Neck ROM: Full    Dental no notable dental hx.    Pulmonary neg pulmonary ROS, former smoker,    breath sounds clear to auscultation + decreased breath sounds      Cardiovascular hypertension, Normal cardiovascular exam+ dysrhythmias  Rhythm:Regular Rate:Normal     Neuro/Psych negative neurological ROS  negative psych ROS   GI/Hepatic negative GI ROS, Neg liver ROS,   Endo/Other  diabetes, Insulin DependentMorbid obesity  Renal/GU Renal disease  negative genitourinary   Musculoskeletal negative musculoskeletal ROS (+)   Abdominal   Peds negative pediatric ROS (+)  Hematology negative hematology ROS (+)   Anesthesia Other Findings   Reproductive/Obstetrics negative OB ROS                             Anesthesia Physical Anesthesia Plan  ASA: III  Anesthesia Plan: General   Post-op Pain Management:    Induction: Intravenous  PONV Risk Score and Plan: 3 and Ondansetron, Dexamethasone and Treatment may vary due to age or medical condition  Airway Management Planned: LMA  Additional Equipment:   Intra-op Plan:   Post-operative Plan: Extubation in OR  Informed Consent: I have reviewed the patients History and Physical, chart, labs and discussed the procedure including the risks, benefits and alternatives for the proposed anesthesia with the patient or authorized representative who has indicated his/her understanding and acceptance.   Dental advisory given  Plan Discussed with: CRNA and Surgeon  Anesthesia Plan Comments:         Anesthesia Quick Evaluation

## 2017-10-28 NOTE — Transfer of Care (Signed)
Immediate Anesthesia Transfer of Care Note  Patient: Valerie [name]  Procedure(s) Performed: Procedure(s) (LRB): CYSTOSCOPY WITH STENT REPLACEMENT (Right)  Patient Location: PACU  Anesthesia Type: General  Level of Consciousness: awake, sedated, patient cooperative and responds to stimulation  Airway & Oxygen Therapy: Patient Spontanous Breathing and Patient connected to [place] oxygen  Post-op Assessment: Report given to PACU RN, Post -op Vital signs reviewed and stable and Patient moving all extremities  Post vital signs: Reviewed and stable  Complications: No apparent anesthesia complications

## 2017-10-28 NOTE — Anesthesia Procedure Notes (Signed)
Procedure Name: LMA Insertion Date/Time: 10/28/2017 9:29 AM Performed by: Jessica PriestBEESON, Valerie [name] Pre-anesthesia Checklist: Patient identified, Emergency Drugs available, Suction available and Patient being monitored Patient Re-evaluated:Patient Re-evaluated prior to induction Oxygen Delivery Method: Circle system utilized Preoxygenation: Pre-oxygenation with 100% oxygen Induction Type: IV induction Ventilation: Mask ventilation without difficulty LMA: LMA inserted LMA Size: 4.0 Number of attempts: 1 Airway Equipment and Method: Bite block Placement Confirmation: positive ETCO2 and breath sounds checked- equal and bilateral Tube secured with: Tape Dental Injury: Teeth and Oropharynx as per pre-operative assessment

## 2017-10-29 ENCOUNTER — Encounter (HOSPITAL_BASED_OUTPATIENT_CLINIC_OR_DEPARTMENT_OTHER): Payer: Self-pay | Admitting: Urology

## 2017-11-01 ENCOUNTER — Encounter (HOSPITAL_BASED_OUTPATIENT_CLINIC_OR_DEPARTMENT_OTHER): Payer: Medicare Other | Attending: Internal Medicine

## 2017-11-01 ENCOUNTER — Ambulatory Visit: Payer: Medicare Other

## 2017-11-01 DIAGNOSIS — I10 Essential (primary) hypertension: Secondary | ICD-10-CM | POA: Insufficient documentation

## 2017-11-01 DIAGNOSIS — T8131XA Disruption of external operation (surgical) wound, not elsewhere classified, initial encounter: Secondary | ICD-10-CM | POA: Insufficient documentation

## 2017-11-01 DIAGNOSIS — E1140 Type 2 diabetes mellitus with diabetic neuropathy, unspecified: Secondary | ICD-10-CM | POA: Insufficient documentation

## 2017-11-01 DIAGNOSIS — Y838 Other surgical procedures as the cause of abnormal reaction of the patient, or of later complication, without mention of misadventure at the time of the procedure: Secondary | ICD-10-CM | POA: Insufficient documentation

## 2017-11-25 DIAGNOSIS — T8131XA Disruption of external operation (surgical) wound, not elsewhere classified, initial encounter: Secondary | ICD-10-CM | POA: Diagnosis not present

## 2017-11-30 ENCOUNTER — Ambulatory Visit: Payer: Medicare Other

## 2017-12-01 ENCOUNTER — Ambulatory Visit: Payer: Medicare Other | Admitting: Podiatry

## 2017-12-09 ENCOUNTER — Encounter (HOSPITAL_BASED_OUTPATIENT_CLINIC_OR_DEPARTMENT_OTHER): Payer: Medicare Other | Attending: Internal Medicine

## 2017-12-09 DIAGNOSIS — E1140 Type 2 diabetes mellitus with diabetic neuropathy, unspecified: Secondary | ICD-10-CM | POA: Insufficient documentation

## 2017-12-09 DIAGNOSIS — I10 Essential (primary) hypertension: Secondary | ICD-10-CM | POA: Insufficient documentation

## 2017-12-09 DIAGNOSIS — Y838 Other surgical procedures as the cause of abnormal reaction of the patient, or of later complication, without mention of misadventure at the time of the procedure: Secondary | ICD-10-CM | POA: Insufficient documentation

## 2017-12-09 DIAGNOSIS — T8131XA Disruption of external operation (surgical) wound, not elsewhere classified, initial encounter: Secondary | ICD-10-CM | POA: Diagnosis present

## 2017-12-15 ENCOUNTER — Ambulatory Visit: Payer: Medicare Other | Admitting: Podiatry

## 2017-12-15 ENCOUNTER — Encounter: Payer: Self-pay | Admitting: Podiatry

## 2017-12-15 DIAGNOSIS — B351 Tinea unguium: Secondary | ICD-10-CM

## 2017-12-15 DIAGNOSIS — E1142 Type 2 diabetes mellitus with diabetic polyneuropathy: Secondary | ICD-10-CM | POA: Diagnosis not present

## 2017-12-15 DIAGNOSIS — M2040 Other hammer toe(s) (acquired), unspecified foot: Secondary | ICD-10-CM

## 2017-12-15 DIAGNOSIS — M79676 Pain in unspecified toe(s): Secondary | ICD-10-CM

## 2017-12-15 NOTE — Progress Notes (Addendum)
Complaint:  Visit Type: Patient returns to my office for continued preventative foot care services. Complaint: Patient states" my nails have grown long and thick and become painful to walk and wear shoes" Patient has been diagnosed with DM with neuropathy.. The patient presents for preventative foot care services. No changes to ROS  Podiatric Exam: Vascular: dorsalis pedis and posterior tibial pulses are palpable bilateral. Capillary return is immediate. Temperature gradient is WNL. Skin turgor WNL  Sensorium: Absent LOPS in toes.  LOPS in foot diminished . Normal tactile sensation bilaterally. Nail Exam: Pt has thick disfigured discolored nails with subungual debris noted bilateral entire nail hallux through fifth toenails Ulcer Exam: There is no evidence of ulcer or pre-ulcerative changes or infection. Orthopedic Exam: Muscle tone and strength are WNL. No limitations in general ROM. No crepitus or effusions noted. Hammer toes  2  Bony prominences are unremarkable. Skin: No Porokeratosis. No infection or ulcers.  Multiple hematomas noted on second and third toes  B/L  .No infections noted.    Diagnosis:  Onychomycosis, , Pain in right toe, pain in left toes  Treatment & Plan Procedures and Treatment: Consent by patient was obtained for treatment procedures. The patient understood the discussion of treatment and procedures well. All questions were answered thoroughly reviewed. Debridement of mycotic and hypertrophic toenails, 1 through 5 bilateral and clearing of subungual debris. No ulceration, no infection noted. Patient does qualify for diabetic shoes in future. Return Visit-Office Procedure: Patient instructed to return to the office for a follow up visit 3 months for continued evaluation and treatment.    Helane GuntherGregory Jaleea Alesi DPM

## 2017-12-31 ENCOUNTER — Inpatient Hospital Stay: Admission: RE | Admit: 2017-12-31 | Payer: Medicare Other | Source: Ambulatory Visit

## 2018-01-03 ENCOUNTER — Encounter (HOSPITAL_BASED_OUTPATIENT_CLINIC_OR_DEPARTMENT_OTHER): Payer: Medicare Other | Attending: Internal Medicine

## 2018-01-03 DIAGNOSIS — E669 Obesity, unspecified: Secondary | ICD-10-CM | POA: Insufficient documentation

## 2018-01-03 DIAGNOSIS — I10 Essential (primary) hypertension: Secondary | ICD-10-CM | POA: Insufficient documentation

## 2018-01-03 DIAGNOSIS — Z6841 Body Mass Index (BMI) 40.0 and over, adult: Secondary | ICD-10-CM | POA: Diagnosis not present

## 2018-01-03 DIAGNOSIS — T8189XA Other complications of procedures, not elsewhere classified, initial encounter: Secondary | ICD-10-CM | POA: Insufficient documentation

## 2018-01-03 DIAGNOSIS — E1140 Type 2 diabetes mellitus with diabetic neuropathy, unspecified: Secondary | ICD-10-CM | POA: Diagnosis not present

## 2018-01-03 DIAGNOSIS — Y838 Other surgical procedures as the cause of abnormal reaction of the patient, or of later complication, without mention of misadventure at the time of the procedure: Secondary | ICD-10-CM | POA: Insufficient documentation

## 2018-01-19 ENCOUNTER — Ambulatory Visit: Payer: Medicare Other

## 2018-02-03 ENCOUNTER — Encounter (HOSPITAL_BASED_OUTPATIENT_CLINIC_OR_DEPARTMENT_OTHER): Payer: Medicare Other | Attending: Internal Medicine

## 2018-02-03 DIAGNOSIS — T8189XA Other complications of procedures, not elsewhere classified, initial encounter: Secondary | ICD-10-CM | POA: Insufficient documentation

## 2018-02-03 DIAGNOSIS — E1140 Type 2 diabetes mellitus with diabetic neuropathy, unspecified: Secondary | ICD-10-CM | POA: Insufficient documentation

## 2018-02-03 DIAGNOSIS — Y838 Other surgical procedures as the cause of abnormal reaction of the patient, or of later complication, without mention of misadventure at the time of the procedure: Secondary | ICD-10-CM | POA: Insufficient documentation

## 2018-02-03 DIAGNOSIS — E1136 Type 2 diabetes mellitus with diabetic cataract: Secondary | ICD-10-CM | POA: Insufficient documentation

## 2018-02-03 DIAGNOSIS — I10 Essential (primary) hypertension: Secondary | ICD-10-CM | POA: Insufficient documentation

## 2018-02-07 ENCOUNTER — Ambulatory Visit
Admission: RE | Admit: 2018-02-07 | Discharge: 2018-02-07 | Disposition: A | Discharge status: Home / Self Care | Payer: Medicare Other | Source: Ambulatory Visit | Attending: Family Medicine | Admitting: Family Medicine

## 2018-02-07 DIAGNOSIS — Z1231 Encounter for screening mammogram for malignant neoplasm of breast: Secondary | ICD-10-CM

## 2018-02-10 DIAGNOSIS — T8189XA Other complications of procedures, not elsewhere classified, initial encounter: Secondary | ICD-10-CM | POA: Diagnosis present

## 2018-02-10 DIAGNOSIS — E1136 Type 2 diabetes mellitus with diabetic cataract: Secondary | ICD-10-CM | POA: Diagnosis not present

## 2018-02-10 DIAGNOSIS — I10 Essential (primary) hypertension: Secondary | ICD-10-CM | POA: Diagnosis not present

## 2018-02-10 DIAGNOSIS — Y838 Other surgical procedures as the cause of abnormal reaction of the patient, or of later complication, without mention of misadventure at the time of the procedure: Secondary | ICD-10-CM | POA: Diagnosis not present

## 2018-02-10 DIAGNOSIS — E1140 Type 2 diabetes mellitus with diabetic neuropathy, unspecified: Secondary | ICD-10-CM | POA: Diagnosis not present

## 2018-03-10 ENCOUNTER — Encounter (HOSPITAL_BASED_OUTPATIENT_CLINIC_OR_DEPARTMENT_OTHER): Payer: Medicare Other | Attending: Internal Medicine

## 2018-03-10 DIAGNOSIS — E1140 Type 2 diabetes mellitus with diabetic neuropathy, unspecified: Secondary | ICD-10-CM | POA: Diagnosis not present

## 2018-03-10 DIAGNOSIS — T8189XA Other complications of procedures, not elsewhere classified, initial encounter: Secondary | ICD-10-CM | POA: Insufficient documentation

## 2018-03-10 DIAGNOSIS — E1136 Type 2 diabetes mellitus with diabetic cataract: Secondary | ICD-10-CM | POA: Diagnosis not present

## 2018-03-10 DIAGNOSIS — Y838 Other surgical procedures as the cause of abnormal reaction of the patient, or of later complication, without mention of misadventure at the time of the procedure: Secondary | ICD-10-CM | POA: Insufficient documentation

## 2018-03-10 DIAGNOSIS — I10 Essential (primary) hypertension: Secondary | ICD-10-CM | POA: Insufficient documentation

## 2018-03-16 ENCOUNTER — Ambulatory Visit: Payer: Medicare Other | Admitting: Podiatry

## 2018-03-17 ENCOUNTER — Other Ambulatory Visit: Payer: Self-pay | Admitting: Urology

## 2018-03-18 ENCOUNTER — Other Ambulatory Visit: Payer: Self-pay

## 2018-03-18 ENCOUNTER — Encounter (HOSPITAL_BASED_OUTPATIENT_CLINIC_OR_DEPARTMENT_OTHER): Payer: Self-pay | Admitting: *Deleted

## 2018-03-18 NOTE — Progress Notes (Addendum)
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

## 2018-03-21 NOTE — Anesthesia Preprocedure Evaluation (Signed)
Anesthesia Evaluation  Patient identified by MRN, date of birth, ID band Patient awake    Reviewed: Allergy & Precautions, NPO status , Patient's Chart, lab work & pertinent test results  Airway Mallampati: III  TM Distance: >3 FB Neck ROM: Full    Dental no notable dental hx.    Pulmonary neg pulmonary ROS, former smoker,    breath sounds clear to auscultation + decreased breath sounds      Cardiovascular hypertension, Normal cardiovascular exam+ dysrhythmias  Rhythm:Regular Rate:Normal     Neuro/Psych Anxiety negative neurological ROS     GI/Hepatic Neg liver ROS, GERD  ,  Endo/Other  diabetes, Insulin DependentMorbid obesity  Renal/GU Renal disease     Musculoskeletal negative musculoskeletal ROS (+) Arthritis ,   Abdominal   Peds  Hematology negative hematology ROS (+) anemia ,   Anesthesia Other Findings   Reproductive/Obstetrics negative OB ROS                             Anesthesia Physical  Anesthesia Plan  ASA: III  Anesthesia Plan: MAC   Post-op Pain Management:    Induction: Intravenous  PONV Risk Score and Plan: 3 and Ondansetron, Treatment may vary due to age or medical condition, Propofol infusion and Dexamethasone  Airway Management Planned:   Additional Equipment:   Intra-op Plan:   Post-operative Plan:   Informed Consent: I have reviewed the patients History and Physical, chart, labs and discussed the procedure including the risks, benefits and alternatives for the proposed anesthesia with the patient or authorized representative who has indicated his/her understanding and acceptance.   Dental advisory given  Plan Discussed with: CRNA  Anesthesia Plan Comments:         Anesthesia Quick Evaluation

## 2018-03-21 NOTE — H&P (Signed)
CC: I have hydronephrosis (Stent Inserted).  HPI: Valerie [name] is a 72 year-old female established patient who is here for hydronephrosis (Stent Inserted).  Her stent was placed 10/28/2017 . The problem is on the right side. 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 Patient denies kidney stone, ureteral stone, compression from mass, and blood clot.   She has not had any x-rays done.   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 She last had an exchange on 10/28/17. She is on keflex for suppression. She is having some right flank pain which has increased over the last few weeks. She has had no hematuria. She has had no fever. She is voiding well without incontinence. Her UA has possible infection today. She has chronic back pain from DDD.      ALLERGIES: None   MEDICATIONS: Keflex 500 mg capsule 1 capsule PO Q HS  Alprazolam 0.25 mg tablet Oral  Amlodipine Besylate 10 mg tablet Oral  Celecoxib  Escitalopram Oxalate 10 mg tablet  Fish Oil CAPS Oral  Flonase 50 mcg/actuation spray, suspension Nasal  Furosemide 20 mg tablet Oral  Humulin R 100 unit/ml vial Injection  Hydrochlorothiazide 12.5 mg tablet Oral  Lipitor 10 mg tablet Oral  Mucinex 600 mg tablet, extended release 12 hr Oral  Myrbetriq 50 mg tablet, extended release 24 hr Oral  Ondansetron Odt 4 mg tablet,disintegrating Oral  Oxymorphone Hcl Er 30 mg tablet, extended release 12 hr  Pantoprazole Sodium 40 mg tablet, delayed release Oral  Tizanidine Hcl 2 mg tablet Oral     Notes: She has increased the oxymorphone for her back.   GU PSH: Cysto Uretero Biopsy Fulgura - 04/24/2016 Cystoscopy Insert Stent, Right - 10/28/2017, Right - 07/01/2017, Right - 04/15/2017, 04/24/2016, 04/16/2016 Cystoscopy Ureteroscopy, Right - 04/15/2017, 04/16/2016 Locm 300-399Mg /Ml Iodine,1Ml - 04/02/2017 Rpr Umbil Hern; Reduc < 5 Yr - 03/18/2016      PSH  Notes: Epidural injections 2017   NON-GU PSH: Laparoscopy, Surgical; Repair Umbilical Hernia Pancreas Surgery (Unspecified) - 03/18/2016 Small bowel resection - 03/18/2016    GU PMH: Hydronephrosis Unspec, She is due for a stent change in the next 4-6 weeks. I will get that set up. - 09/15/2017, She is doing well post stent exchanged. I will have her return in 3 months to arrange the next exchange. , - 07/14/2017 (Stable), Right, - 08/24/2016 (Improving), There is minimal residual right hydro noted on [place]. , - 07/03/2016, Hydronephrosis, right, - 05/27/2016 Urinary Urgency, Her frequency and urgency are well controlled on Myrbetriq. - 07/14/2017 Gross hematuria, She has gross hematuria that appears to be originating in the right kidney and she has chronic inflammation and obstruction of that kidney, I am going to get a cytology today and will get her set up for ureteroscopy and stenting to better evaluate the collecting system. With CKD3 I think it would be best if she is able to avoid right nephrectomy. I have reviewed the risks of the procedure in detail including bleeding, infection, ureteral injury, need for a stent and secondary procedures, thrombotic events and anesthetic complications. - 04/07/2017, (Acute), Culture urine. Will continue with daily suppression Cephalexin until culture finalized. If she has UTI now resistant to Cephalexin will change suppression. Will proceed with hematuria workup with CT and possible cysto , - 04/01/2017, Gross hematuria, - 03/18/2016 Proteinuria (Acute), With new onset Proteinuria in pt with diabetes and hypertension may need nephrology  referral. Will check BUN/creatine today - 04/01/2017 Chronic cystitis (w/o hematuria) (Stable), Her UA looks infected but she just came of our antibiotic and is on amoxicillin. She is free of symptoms. - 07/03/2016, Chronic cystitis, - 05/27/2016 Urinary Tract Inf, Unspec site, Pyuria - 04/27/2016 Incomplete bladder emptying, Feeling of incomplete  bladder emptying - March 24, 2016 Postmenopausal atrophic vaginitis, Atrophic vaginitis - 03-24-2016    NON-GU PMH: Anxiety, Anxiety - 03/24/2016 Encounter for general adult medical examination without abnormal findings, Encounter for preventive health examination - 03/24/2016 Personal history of other diseases of the circulatory system, History of hypertension - March 24, 2016 Personal history of other diseases of the digestive system, History of small bowel obstruction - 03-24-2016, History of pancreatitis, - 03-24-2016, History of esophageal reflux, - 03-24-16 Personal history of other diseases of the musculoskeletal system and connective tissue, History of arthritis - 03-24-2016 Personal history of other endocrine, nutritional and metabolic disease, History of hypercholesterolemia - 03-24-2016, History of diabetes mellitus, - 2016/03/24    FAMILY HISTORY: Death of family member - Runs In Family Prostate Cancer - Runs In Family   SOCIAL HISTORY: Marital Status: Married Preferred Language: English; Ethnicity: Not Hispanic Or Latino; Race: White Current Smoking Status: Patient does not smoke anymore.  Does not use smokeless tobacco. Has never drank.  Does not use drugs. Drinks 1 caffeinated drink per day.    REVIEW OF SYSTEMS:    GU Review Female:   Patient denies frequent urination, hard to postpone urination, burning /pain with urination, get up at night to urinate, leakage of urine, stream starts and stops, trouble starting your stream, have to strain to urinate, and being pregnant.  Gastrointestinal (Upper):   Patient denies nausea, vomiting, and indigestion/ heartburn.  Gastrointestinal (Lower):   Patient denies diarrhea and constipation.  Constitutional:   Patient reports fatigue. Patient denies fever, night sweats, and weight loss.  Skin:   Patient reports itching. Patient denies skin rash/ lesion.  Eyes:   Patient denies blurred vision and double vision.  Ears/ Nose/ Throat:   Patient reports  sinus problems. Patient denies sore throat.  Hematologic/Lymphatic:   Patient denies swollen glands and easy bruising.  Cardiovascular:   Patient denies leg swelling and chest pains.  Respiratory:   Patient reports shortness of breath. Patient denies cough.  Endocrine:   Patient denies excessive thirst.  Musculoskeletal:   Patient reports back pain and joint pain.   Neurological:   Patient reports dizziness. Patient denies headaches.  Psychologic:   Patient reports depression and anxiety.    VITAL SIGNS:      03/16/2018 03:39 PM  Weight 230 lb / 104.33 kg  Height 64 in / 162.56 cm  BP 117/62 mmHg  Pulse 82 /min  Temperature 98.3 F / 36.8 C  BMI 39.5 kg/m   MULTI-SYSTEM PHYSICAL EXAMINATION:    Constitutional: Obese. No physical deformities. Normally developed. Good grooming.   Respiratory: No labored breathing, no use of accessory muscles. Normal breath sounds.   Cardiovascular: Regular rate and rhythm. No murmur, no gallop. Normal temperature,   Gastrointestinal: Abdominal tenderness in RUQ, obese. No mass, no rigidity.   Musculoskeletal: right SI joint tenderness.      PAST DATA REVIEWED:  Source Of History:  Patient  Urine Test Review:   Urinalysis   PROCEDURES:          Urinalysis w/Scope Dipstick Dipstick Cont'd Micro  Color: Yellow Bilirubin: Neg WBC/hpf: 20 - 40/hpf  Appearance: Cloudy Ketones: Neg RBC/hpf: 40 - 60/hpf  Specific Gravity: 1.025  Blood: 3+ Bacteria: NS (Not Seen)  pH: 6.0 Protein: 3+ Cystals: NS (Not Seen)  Glucose: Neg Urobilinogen: 0.2 Casts: NS (Not Seen)    Nitrites: Neg Trichomonas: Not Present    Leukocyte Esterase: 2+ Mucous: Not Present      Epithelial Cells: NS (Not Seen)      Yeast: NS (Not Seen)      Sperm: Not Present    ASSESSMENT:      ICD-10 Details  1 GU:   Hydronephrosis Unspec - N13.30 Worsening - She is overdue for a stent exchange and has some increased pain. I will get a urine culture and get her set up for the stent exchange  next week.   2   Chronic cystitis (w/o hematuria) - N30.20    PLAN:           Orders Labs Urine Culture          Schedule Return Visit/Planned Activity: ASAP - Schedule Surgery             Note: cystoscopy with right stent exchange.   Procedure: Unspecified Date - Cystoscopy Insert Stent - [id number], right Notes: Next available.

## 2018-03-22 ENCOUNTER — Ambulatory Visit (HOSPITAL_BASED_OUTPATIENT_CLINIC_OR_DEPARTMENT_OTHER)
Admission: RE | Admit: 2018-03-22 | Discharge: 2018-03-22 | Disposition: A | Discharge status: Home / Self Care | Payer: Medicare Other | Source: Ambulatory Visit | Attending: Urology | Admitting: Urology

## 2018-03-22 ENCOUNTER — Encounter (HOSPITAL_BASED_OUTPATIENT_CLINIC_OR_DEPARTMENT_OTHER): Payer: Self-pay | Admitting: *Deleted

## 2018-03-22 ENCOUNTER — Encounter (HOSPITAL_BASED_OUTPATIENT_CLINIC_OR_DEPARTMENT_OTHER)
Admission: RE | Disposition: A | Discharge status: Home / Self Care | Payer: Self-pay | Source: Ambulatory Visit | Attending: Urology

## 2018-03-22 ENCOUNTER — Other Ambulatory Visit: Payer: Self-pay

## 2018-03-22 ENCOUNTER — Ambulatory Visit (HOSPITAL_BASED_OUTPATIENT_CLINIC_OR_DEPARTMENT_OTHER): Payer: Medicare Other | Admitting: Anesthesiology

## 2018-03-22 DIAGNOSIS — N1330 Unspecified hydronephrosis: Secondary | ICD-10-CM | POA: Diagnosis present

## 2018-03-22 DIAGNOSIS — K219 Gastro-esophageal reflux disease without esophagitis: Secondary | ICD-10-CM | POA: Diagnosis not present

## 2018-03-22 DIAGNOSIS — I10 Essential (primary) hypertension: Secondary | ICD-10-CM | POA: Insufficient documentation

## 2018-03-22 DIAGNOSIS — Z87891 Personal history of nicotine dependence: Secondary | ICD-10-CM | POA: Insufficient documentation

## 2018-03-22 DIAGNOSIS — Z79899 Other long term (current) drug therapy: Secondary | ICD-10-CM | POA: Diagnosis not present

## 2018-03-22 DIAGNOSIS — Z794 Long term (current) use of insulin: Secondary | ICD-10-CM | POA: Insufficient documentation

## 2018-03-22 DIAGNOSIS — E119 Type 2 diabetes mellitus without complications: Secondary | ICD-10-CM | POA: Diagnosis not present

## 2018-03-22 HISTORY — PX: CYSTOSCOPY WITH STENT PLACEMENT: SHX5790

## 2018-03-22 LAB — POCT I-STAT, CHEM 8
BUN: 41 mg/dL — ABNORMAL HIGH (ref 6–20)
CREATININE: 1.3 mg/dL — AB (ref 0.44–1.00)
Calcium, Ion: 1.21 mmol/L (ref 1.15–1.40)
Chloride: 107 mmol/L (ref 101–111)
Glucose, Bld: 106 mg/dL — ABNORMAL HIGH (ref 65–99)
HEMATOCRIT: 34 % — AB (ref 36.0–46.0)
HEMOGLOBIN: 11.6 g/dL — AB (ref 12.0–15.0)
POTASSIUM: 4.5 mmol/L (ref 3.5–5.1)
SODIUM: 142 mmol/L (ref 135–145)
TCO2: 23 mmol/L (ref 22–32)

## 2018-03-22 LAB — GLUCOSE, CAPILLARY: Glucose-Capillary: 158 mg/dL — ABNORMAL HIGH (ref 65–99)

## 2018-03-22 SURGERY — CYSTOSCOPY, WITH STENT INSERTION
Anesthesia: Monitor Anesthesia Care | Site: Bladder | Laterality: Right

## 2018-03-22 MED ORDER — STERILE WATER FOR IRRIGATION IR SOLN
Status: DC | PRN
  Administered 2018-03-22: 3000 mL via INTRAVESICAL

## 2018-03-22 MED ORDER — PROPOFOL 10 MG/ML IV BOLUS
INTRAVENOUS | Status: DC | PRN
  Administered 2018-03-22: 30 mg via INTRAVENOUS

## 2018-03-22 MED ORDER — FENTANYL CITRATE (PF) 100 MCG/2ML IJ SOLN
25.0000 ug | INTRAMUSCULAR | Status: DC | PRN
  Administered 2018-03-22: 50 ug via INTRAVENOUS
  Administered 2018-03-22: 25 ug via INTRAVENOUS
  Filled 2018-03-22: qty 1

## 2018-03-22 MED ORDER — CEFAZOLIN SODIUM-DEXTROSE 2-4 GM/100ML-% IV SOLN
2.0000 g | INTRAVENOUS | Status: AC
  Administered 2018-03-22: 2 g via INTRAVENOUS
  Filled 2018-03-22: qty 100

## 2018-03-22 MED ORDER — SODIUM CHLORIDE 0.9 % IV SOLN
INTRAVENOUS | Status: DC
  Administered 2018-03-22: 06:00:00 via INTRAVENOUS
  Filled 2018-03-22: qty 1000

## 2018-03-22 MED ORDER — PROPOFOL 500 MG/50ML IV EMUL
INTRAVENOUS | Status: DC | PRN
  Administered 2018-03-22: 100 ug/kg/min via INTRAVENOUS

## 2018-03-22 MED ORDER — GENTAMICIN SULFATE 40 MG/ML IJ SOLN
5.0000 mg/kg | INTRAVENOUS | Status: AC
  Administered 2018-03-22: 550 mg via INTRAVENOUS
  Filled 2018-03-22 (×3): qty 13.75

## 2018-03-22 MED ORDER — FENTANYL CITRATE (PF) 100 MCG/2ML IJ SOLN
INTRAMUSCULAR | Status: AC
  Filled 2018-03-22: qty 2

## 2018-03-22 MED ORDER — CEFAZOLIN SODIUM-DEXTROSE 2-4 GM/100ML-% IV SOLN
INTRAVENOUS | Status: AC
  Filled 2018-03-22: qty 100

## 2018-03-22 MED ORDER — FENTANYL CITRATE (PF) 100 MCG/2ML IJ SOLN
INTRAMUSCULAR | Status: DC | PRN
  Administered 2018-03-22: 25 ug via INTRAVENOUS
  Administered 2018-03-22: 50 ug via INTRAVENOUS
  Administered 2018-03-22: 25 ug via INTRAVENOUS

## 2018-03-22 MED ORDER — EPHEDRINE SULFATE-NACL 50-0.9 MG/10ML-% IV SOSY
PREFILLED_SYRINGE | INTRAVENOUS | Status: DC | PRN
  Administered 2018-03-22: 10 mg via INTRAVENOUS

## 2018-03-22 MED ORDER — PROPOFOL 500 MG/50ML IV EMUL
INTRAVENOUS | Status: AC
  Filled 2018-03-22: qty 50

## 2018-03-22 MED ORDER — EPHEDRINE 5 MG/ML INJ
INTRAVENOUS | Status: AC
  Filled 2018-03-22: qty 10

## 2018-03-22 MED ORDER — PROMETHAZINE HCL 25 MG/ML IJ SOLN
6.2500 mg | INTRAMUSCULAR | Status: DC | PRN
  Filled 2018-03-22: qty 1

## 2018-03-22 MED ORDER — MEPERIDINE HCL 25 MG/ML IJ SOLN
6.2500 mg | INTRAMUSCULAR | Status: DC | PRN
  Filled 2018-03-22: qty 1

## 2018-03-22 SURGICAL SUPPLY — 20 items
BAG DRAIN URO-CYSTO SKYTR STRL (DRAIN) ×3 IMPLANT
CATH URET 5FR 28IN CONE TIP (BALLOONS)
CATH URET 5FR 28IN OPEN ENDED (CATHETERS) ×3 IMPLANT
CATH URET 5FR 70CM CONE TIP (BALLOONS) IMPLANT
CLOTH BEACON ORANGE TIMEOUT ST (SAFETY) ×3 IMPLANT
GLOVE SURG SS PI 8.0 STRL IVOR (GLOVE) ×3 IMPLANT
GOWN STRL REUS W/ TWL LRG LVL3 (GOWN DISPOSABLE) ×1 IMPLANT
GOWN STRL REUS W/ TWL XL LVL3 (GOWN DISPOSABLE) ×1 IMPLANT
GOWN STRL REUS W/TWL LRG LVL3 (GOWN DISPOSABLE) ×2
GOWN STRL REUS W/TWL XL LVL3 (GOWN DISPOSABLE) ×2
GUIDEWIRE 0.038 PTFE COATED (WIRE) IMPLANT
GUIDEWIRE ANG ZIPWIRE 038X150 (WIRE) IMPLANT
GUIDEWIRE STR DUAL SENSOR (WIRE) ×3 IMPLANT
KIT TURNOVER CYSTO (KITS) ×3 IMPLANT
MANIFOLD NEPTUNE II (INSTRUMENTS) ×3 IMPLANT
NS IRRIG 500ML POUR BTL (IV SOLUTION) IMPLANT
PACK CYSTO (CUSTOM PROCEDURE TRAY) ×3 IMPLANT
STENT URET 6FRX24 CONTOUR (STENTS) ×3 IMPLANT
TUBE CONNECTING 12'X1/4 (SUCTIONS) ×1
TUBE CONNECTING 12X1/4 (SUCTIONS) ×2 IMPLANT

## 2018-03-22 NOTE — Interval H&P Note (Signed)
History and Physical Interval Note:  03/22/2018 7:18 AM  Valerie [name]  has presented today for surgery, with the diagnosis of RIGHT HYDRONEPHROSIS  The various methods of treatment have been discussed with the patient and family. After consideration of risks, benefits and other options for treatment, the patient has consented to  Procedure(s): CYSTOSCOPY WITH RIGHT STENT EXCHANGE (Right) as a surgical intervention .  The patient's history has been reviewed, patient examined, no change in status, stable for surgery.  I have reviewed the patient's chart and labs.  Questions were answered to the patient's satisfaction.     Bjorn PippinJohn Jermisha Hoffart

## 2018-03-22 NOTE — Op Note (Signed)
Procedure: Cystoscopy with exchange of right double-J stent.  Preop diagnosis: Chronic right hydronephrosis.  Postop diagnosis: Same.  Surgeon: Dr. [name].  Anesthesia: MAC.  Drain: 6 [place] by 24 cm right contour double-J stent.  Specimen: None.  Blood loss: None.  Complications: None.  Indications: Julyana is a 72 year old female with a history of chronic right hydronephrosis managed with stent exchanges.  She presents today for stent replacement.  Procedure: She was taken the operating room where she was given Ancef and gentamicin.  She was placed in lithotomy position and fitted with PAS hose and sedation was given as needed.  Her perineum and genitalia were prepped with Betadine solution and she was draped in the usual sterile fashion.  Cystoscopy was performed with a 23 [place] scope and 30 degree lens.  Inspection demonstrated an encrusted stent loop at the right ureteral orifice with some surrounding erythema and edema but no other bladder wall abnormalities.  The stent loop was grasped with a grasping forceps and pulled to the urethral meatus.  A sensor guidewire was advanced through the stent to the kidney and the old stent was removed.  The cystoscope was reinserted over the wire and a fresh 6 [place] by 24 cm contour double-J stent without tether was passed to the kidney under fluoroscopic guidance.  The wire was removed leaving a good coil in the kidney and a good coil in the bladder.  The cystoscope was removed after the bladder was drained.  She was taken down from lithotomy position and moved to the recovery room in stable condition.  There were no complications

## 2018-03-22 NOTE — Transfer of Care (Signed)
Immediate Anesthesia Transfer of Care Note  Patient: Valerie [name]  Procedure(s) Performed: Procedure(s) (LRB): CYSTOSCOPY WITH RIGHT STENT EXCHANGE (Right)  Patient Location: PACU  Anesthesia Type: General  Level of Consciousness: awake, oriented, sedated and patient cooperative  Airway & Oxygen Therapy: Patient Spontanous Breathing and Patient connected to face mask oxygen  Post-op Assessment: Report given to PACU RN and Post -op Vital signs reviewed and stable  Post vital signs: Reviewed and stable  Complications: No apparent anesthesia complications  Last Vitals:  Vitals Value Taken Time  BP 121/61 03/22/2018  8:08 AM  Temp    Pulse 89 03/22/2018  8:10 AM  Resp 18 03/22/2018  8:10 AM  SpO2 100 % 03/22/2018  8:10 AM  Vitals shown include unvalidated device data.  Last Pain:  Vitals:   03/22/18 0612  TempSrc:   PainSc: 0-No pain

## 2018-03-22 NOTE — Discharge Instructions (Addendum)
Ureteral Stent Implantation, Care After °Refer to this sheet in the next few weeks. These instructions provide you with information about caring for yourself after your procedure. Your health care provider may also give you more specific instructions. Your treatment has been planned according to current medical practices, but problems sometimes occur. Call your health care provider if you have any problems or questions after your procedure. °What can I expect after the procedure? °After the procedure, it is common to have: °· Nausea. °· Mild pain when you urinate. You may feel this pain in your lower back or lower abdomen. Pain should stop within a few minutes after you urinate. This may last for up to 1 week. °· A small amount of blood in your urine for several days. ° °Follow these instructions at home: ° °Medicines °· Take over-the-counter and prescription medicines only as told by your health care provider. °· If you were prescribed an antibiotic medicine, take it as told by your health care provider. Do not stop taking the antibiotic even if you start to feel better. °· Do not drive for 24 hours if you received a sedative. °· Do not drive or operate heavy machinery while taking prescription pain medicines. °Activity °· Return to your normal activities as told by your health care provider. Ask your health care provider what activities are safe for you. °· Do not lift anything that is heavier than 10 lb (4.5 kg). Follow this limit for 1 week after your procedure, or for as long as told by your health care provider. °General instructions °· Watch for any blood in your urine. Call your health care provider if the amount of blood in your urine increases. °· If you have a catheter: °? Follow instructions from your health care provider about taking care of your catheter and collection bag. °? Do not take baths, swim, or use a hot tub until your health care provider approves. °· Drink enough fluid to keep your urine  clear or pale yellow. °· Keep all follow-up visits as told by your health care provider. This is important. °Contact a health care provider if: °· You have pain that gets worse or does not get better with medicine, especially pain when you urinate. °· You have difficulty urinating. °· You feel nauseous or you vomit repeatedly during a period of more than 2 days after the procedure. °Get help right away if: °· Your urine is dark red or has blood clots in it. °· You are leaking urine (have incontinence). °· The end of the stent comes out of your urethra. °· You cannot urinate. °· You have sudden, sharp, or severe pain in your abdomen or lower back. °· You have a fever. °This information is not intended to replace advice given to you by your health care provider. Make sure you discuss any questions you have with your health care provider. °Document Released: 08/16/2013 Document Revised: 05/21/2016 Document Reviewed: 06/28/2015 °Elsevier Interactive Patient Education © 2018 [employer] ° ° °Post Anesthesia Home Care Instructions ° °Activity: °Get plenty of rest for the remainder of the day. A responsible individual must stay with you for 24 hours following the procedure.  °For the next 24 hours, DO NOT: °-Drive a car °-Operate machinery °-Drink alcoholic beverages °-Take any medication unless instructed by your physician °-Make any legal decisions or sign important papers. ° °Meals: °Start with liquid foods such as gelatin or soup. Progress to regular foods as tolerated. Avoid greasy, spicy, heavy foods. If nausea   and/or vomiting occur, drink only clear liquids until the nausea and/or vomiting subsides. Call your physician if vomiting continues. ° °Special Instructions/Symptoms: °Your throat may feel dry or sore from the anesthesia or the breathing tube placed in your throat during surgery. If this causes discomfort, gargle with warm salt water. The discomfort should disappear within 24 hours. ° °If you had a  scopolamine patch placed behind your ear for the management of post- operative nausea and/or vomiting: ° °1. The medication in the patch is effective for 72 hours, after which it should be removed.  Wrap patch in a tissue and discard in the trash. Wash hands thoroughly with soap and water. °2. You may remove the patch earlier than 72 hours if you experience unpleasant side effects which may include dry mouth, dizziness or visual disturbances. °3. Avoid touching the patch. Wash your hands with soap and water after contact with the patch. °  ° °

## 2018-03-23 ENCOUNTER — Encounter (HOSPITAL_BASED_OUTPATIENT_CLINIC_OR_DEPARTMENT_OTHER): Payer: Self-pay | Admitting: Urology

## 2018-03-23 NOTE — Anesthesia Postprocedure Evaluation (Signed)
Anesthesia Post Note  Patient: Valerie [name]  Procedure(s) Performed: CYSTOSCOPY WITH RIGHT STENT EXCHANGE (Right Bladder)     Anesthesia Post Evaluation  Last Vitals:  Vitals:   03/22/18 0845 03/22/18 0927  BP: (!) 128/53 114/68  Pulse: 88 95  Resp: (!) 25 (!) 24  Temp:  36.8 C  SpO2: 96% 95%    Last Pain:  Vitals:   03/23/18 1351  TempSrc:   PainSc: 4    Pain Goal:                 [name]

## 2018-03-29 ENCOUNTER — Ambulatory Visit: Payer: Medicare Other | Admitting: Podiatry

## 2018-03-29 ENCOUNTER — Encounter: Payer: Self-pay | Admitting: Podiatry

## 2018-03-29 DIAGNOSIS — M2040 Other hammer toe(s) (acquired), unspecified foot: Secondary | ICD-10-CM

## 2018-03-29 DIAGNOSIS — M79676 Pain in unspecified toe(s): Secondary | ICD-10-CM | POA: Diagnosis not present

## 2018-03-29 DIAGNOSIS — E1142 Type 2 diabetes mellitus with diabetic polyneuropathy: Secondary | ICD-10-CM | POA: Diagnosis not present

## 2018-03-29 DIAGNOSIS — B351 Tinea unguium: Secondary | ICD-10-CM

## 2018-03-29 NOTE — Progress Notes (Signed)
Complaint:  Visit Type: Patient returns to my office for continued preventative foot care services. Complaint: Patient states" my nails have grown long and thick and become painful to walk and wear shoes" Patient has been diagnosed with DM with neuropathy.. The patient presents for preventative foot care services. No changes to ROS  Podiatric Exam: Vascular: dorsalis pedis and posterior tibial pulses are palpable bilateral. Capillary return is immediate. Temperature gradient is WNL. Skin turgor WNL  Sensorium: Absent LOPS in toes.  LOPS in foot diminished . Normal tactile sensation bilaterally. Nail Exam: Pt has thick disfigured discolored nails with subungual debris noted bilateral entire nail hallux through fifth toenails Ulcer Exam: There is no evidence of ulcer or pre-ulcerative changes or infection. Orthopedic Exam: Muscle tone and strength are WNL. No limitations in general ROM. No crepitus or effusions noted. Hammer toes  2  Bony prominences are unremarkable. Skin: No Porokeratosis. No infection or ulcers.  Third toenail right foot is loosely attached to nail bed. .No infections noted.    Diagnosis:  Onychomycosis, , Pain in right toe, pain in left toes  Treatment & Plan Procedures and Treatment: Consent by patient was obtained for treatment procedures. The patient understood the discussion of treatment and procedures well. All questions were answered thoroughly reviewed. Debridement of mycotic and hypertrophic toenails, 1 through 5 bilateral and clearing of subungual debris. No ulceration, no infection noted. Patient does qualify for diabetic shoes in future. Return Visit-Office Procedure: Patient instructed to return to the office for a follow up visit 3 months for continued evaluation and treatment.    Helane GuntherGregory Kinshasa Throckmorton DPM

## 2018-04-04 ENCOUNTER — Encounter (HOSPITAL_BASED_OUTPATIENT_CLINIC_OR_DEPARTMENT_OTHER): Payer: Medicare Other | Attending: Internal Medicine

## 2018-04-04 DIAGNOSIS — T8189XA Other complications of procedures, not elsewhere classified, initial encounter: Secondary | ICD-10-CM | POA: Insufficient documentation

## 2018-04-04 DIAGNOSIS — Y838 Other surgical procedures as the cause of abnormal reaction of the patient, or of later complication, without mention of misadventure at the time of the procedure: Secondary | ICD-10-CM | POA: Insufficient documentation

## 2018-04-04 DIAGNOSIS — I10 Essential (primary) hypertension: Secondary | ICD-10-CM | POA: Diagnosis not present

## 2018-04-04 DIAGNOSIS — E1140 Type 2 diabetes mellitus with diabetic neuropathy, unspecified: Secondary | ICD-10-CM | POA: Diagnosis not present

## 2018-04-04 DIAGNOSIS — S31105A Unspecified open wound of abdominal wall, periumbilic region without penetration into peritoneal cavity, initial encounter: Secondary | ICD-10-CM | POA: Insufficient documentation

## 2018-04-04 DIAGNOSIS — X58XXXA Exposure to other specified factors, initial encounter: Secondary | ICD-10-CM | POA: Diagnosis not present

## 2018-05-02 ENCOUNTER — Encounter (HOSPITAL_BASED_OUTPATIENT_CLINIC_OR_DEPARTMENT_OTHER): Payer: Medicare Other | Attending: Internal Medicine

## 2018-05-02 DIAGNOSIS — E1140 Type 2 diabetes mellitus with diabetic neuropathy, unspecified: Secondary | ICD-10-CM | POA: Diagnosis not present

## 2018-05-02 DIAGNOSIS — T8189XA Other complications of procedures, not elsewhere classified, initial encounter: Secondary | ICD-10-CM | POA: Diagnosis present

## 2018-05-02 DIAGNOSIS — I10 Essential (primary) hypertension: Secondary | ICD-10-CM | POA: Diagnosis not present

## 2018-05-02 DIAGNOSIS — Y838 Other surgical procedures as the cause of abnormal reaction of the patient, or of later complication, without mention of misadventure at the time of the procedure: Secondary | ICD-10-CM | POA: Diagnosis not present

## 2018-05-02 DIAGNOSIS — X58XXXA Exposure to other specified factors, initial encounter: Secondary | ICD-10-CM | POA: Insufficient documentation

## 2018-05-02 DIAGNOSIS — S31119A Laceration without foreign body of abdominal wall, unspecified quadrant without penetration into peritoneal cavity, initial encounter: Secondary | ICD-10-CM | POA: Diagnosis not present

## 2018-05-19 DIAGNOSIS — T8189XA Other complications of procedures, not elsewhere classified, initial encounter: Secondary | ICD-10-CM | POA: Diagnosis not present

## 2018-06-28 ENCOUNTER — Ambulatory Visit: Payer: Medicare Other | Admitting: Podiatry

## 2018-07-04 ENCOUNTER — Encounter (HOSPITAL_BASED_OUTPATIENT_CLINIC_OR_DEPARTMENT_OTHER): Payer: Medicare Other | Attending: Internal Medicine

## 2018-07-04 DIAGNOSIS — I10 Essential (primary) hypertension: Secondary | ICD-10-CM | POA: Diagnosis not present

## 2018-07-04 DIAGNOSIS — T8189XA Other complications of procedures, not elsewhere classified, initial encounter: Secondary | ICD-10-CM | POA: Insufficient documentation

## 2018-07-04 DIAGNOSIS — Y838 Other surgical procedures as the cause of abnormal reaction of the patient, or of later complication, without mention of misadventure at the time of the procedure: Secondary | ICD-10-CM | POA: Insufficient documentation

## 2018-07-04 DIAGNOSIS — E1140 Type 2 diabetes mellitus with diabetic neuropathy, unspecified: Secondary | ICD-10-CM | POA: Insufficient documentation

## 2018-07-15 ENCOUNTER — Encounter: Payer: Self-pay | Admitting: Podiatry

## 2018-07-15 ENCOUNTER — Ambulatory Visit: Payer: Medicare Other | Admitting: Podiatry

## 2018-07-15 DIAGNOSIS — B351 Tinea unguium: Secondary | ICD-10-CM | POA: Diagnosis not present

## 2018-07-15 DIAGNOSIS — M79676 Pain in unspecified toe(s): Secondary | ICD-10-CM | POA: Diagnosis not present

## 2018-07-15 DIAGNOSIS — E1142 Type 2 diabetes mellitus with diabetic polyneuropathy: Secondary | ICD-10-CM | POA: Diagnosis not present

## 2018-07-15 NOTE — Progress Notes (Signed)
Complaint:  Visit Type: Patient returns to my office for continued preventative foot care services. Complaint: Patient states" my nails have grown long and thick and become painful to walk and wear shoes" Patient has been diagnosed with DM with neuropathy.. The patient presents for preventative foot care services. No changes to ROS  Podiatric Exam: Vascular: dorsalis pedis and posterior tibial pulses are palpable bilateral. Capillary return is immediate. Temperature gradient is WNL. Skin turgor WNL  Sensorium: Absent LOPS in toes.  LOPS in foot diminished . Normal tactile sensation bilaterally. Nail Exam: Pt has thick disfigured discolored nails with subungual debris noted bilateral entire nail hallux through fifth toenails Ulcer Exam: There is no evidence of ulcer or pre-ulcerative changes or infection. Orthopedic Exam: Muscle tone and strength are WNL. No limitations in general ROM. No crepitus or effusions noted. Hammer toes  2  Bony prominences are unremarkable. Skin: No Porokeratosis. No infection or ulcers.  Third toenail right foot is loosely attached to nail bed. .No infections noted.    Diagnosis:  Onychomycosis, , Pain in right toe, pain in left toes  Treatment & Plan Procedures and Treatment: Consent by patient was obtained for treatment procedures. The patient understood the discussion of treatment and procedures well. All questions were answered thoroughly reviewed. Debridement of mycotic and hypertrophic toenails, 1 through 5 bilateral and clearing of subungual debris. No ulceration, no infection noted. Return Visit-Office Procedure: Patient instructed to return to the office for a follow up visit 3 months for continued evaluation and treatment.    Helane GuntherGregory Aubert Choyce DPM

## 2018-07-26 ENCOUNTER — Emergency Department (HOSPITAL_COMMUNITY)
Admission: EM | Admit: 2018-07-26 | Discharge: 2018-07-27 | Disposition: A | Discharge status: Home / Self Care | Payer: Medicare Other | Attending: Emergency Medicine | Admitting: Emergency Medicine

## 2018-07-26 ENCOUNTER — Emergency Department (HOSPITAL_COMMUNITY): Payer: Medicare Other

## 2018-07-26 ENCOUNTER — Other Ambulatory Visit: Payer: Self-pay

## 2018-07-26 ENCOUNTER — Encounter (HOSPITAL_COMMUNITY): Payer: Self-pay

## 2018-07-26 DIAGNOSIS — N184 Chronic kidney disease, stage 4 (severe): Secondary | ICD-10-CM | POA: Insufficient documentation

## 2018-07-26 DIAGNOSIS — R202 Paresthesia of skin: Secondary | ICD-10-CM

## 2018-07-26 DIAGNOSIS — E1122 Type 2 diabetes mellitus with diabetic chronic kidney disease: Secondary | ICD-10-CM | POA: Diagnosis not present

## 2018-07-26 DIAGNOSIS — Z79899 Other long term (current) drug therapy: Secondary | ICD-10-CM | POA: Insufficient documentation

## 2018-07-26 DIAGNOSIS — N289 Disorder of kidney and ureter, unspecified: Secondary | ICD-10-CM | POA: Diagnosis not present

## 2018-07-26 DIAGNOSIS — Z794 Long term (current) use of insulin: Secondary | ICD-10-CM | POA: Insufficient documentation

## 2018-07-26 DIAGNOSIS — Z87891 Personal history of nicotine dependence: Secondary | ICD-10-CM | POA: Diagnosis not present

## 2018-07-26 DIAGNOSIS — R600 Localized edema: Secondary | ICD-10-CM | POA: Insufficient documentation

## 2018-07-26 DIAGNOSIS — I129 Hypertensive chronic kidney disease with stage 1 through stage 4 chronic kidney disease, or unspecified chronic kidney disease: Secondary | ICD-10-CM | POA: Diagnosis not present

## 2018-07-26 DIAGNOSIS — R200 Anesthesia of skin: Secondary | ICD-10-CM

## 2018-07-26 LAB — URINALYSIS, ROUTINE W REFLEX MICROSCOPIC
Bilirubin Urine: NEGATIVE
GLUCOSE, UA: NEGATIVE mg/dL
KETONES UR: NEGATIVE mg/dL
Nitrite: NEGATIVE
PROTEIN: 100 mg/dL — AB
Specific Gravity, Urine: 1.011 (ref 1.005–1.030)
WBC, UA: >50 WBC/hpf — ABNORMAL HIGH (ref 0–5)
pH: 5 (ref 5.0–8.0)

## 2018-07-26 LAB — COMPREHENSIVE METABOLIC PANEL
ALK PHOS: 76 U/L (ref 38–126)
ALT: 15 U/L (ref 0–44)
AST: 21 U/L (ref 15–41)
Albumin: 2.8 g/dL — ABNORMAL LOW (ref 3.5–5.0)
Anion gap: 11 (ref 5–15)
BILIRUBIN TOTAL: 0.4 mg/dL (ref 0.3–1.2)
BUN: 50 mg/dL — AB (ref 8–23)
CHLORIDE: 105 mmol/L (ref 98–111)
CO2: 24 mmol/L (ref 22–32)
CREATININE: 2.22 mg/dL — AB (ref 0.44–1.00)
Calcium: 8.7 mg/dL — ABNORMAL LOW (ref 8.9–10.3)
GFR calc Af Amer: 24 mL/min — ABNORMAL LOW (ref >60)
GFR, EST NON AFRICAN AMERICAN: 21 mL/min — AB (ref >60)
Glucose, Bld: 82 mg/dL (ref 70–99)
Potassium: 4.4 mmol/L (ref 3.5–5.1)
Sodium: 140 mmol/L (ref 135–145)
TOTAL PROTEIN: 6.2 g/dL — AB (ref 6.5–8.1)

## 2018-07-26 LAB — CBC
HEMATOCRIT: 33.2 % — AB (ref 36.0–46.0)
Hemoglobin: 10.4 g/dL — ABNORMAL LOW (ref 12.0–15.0)
MCH: 27.2 pg (ref 26.0–34.0)
MCHC: 31.3 g/dL (ref 30.0–36.0)
MCV: 86.9 fL (ref 78.0–100.0)
Platelets: 147 10*3/uL — ABNORMAL LOW (ref 150–400)
RBC: 3.82 MIL/uL — AB (ref 3.87–5.11)
RDW: 14.6 % (ref 11.5–15.5)
WBC: 6.1 10*3/uL (ref 4.0–10.5)

## 2018-07-26 LAB — CBG MONITORING, ED: Glucose-Capillary: 113 mg/dL — ABNORMAL HIGH (ref 70–99)

## 2018-07-26 LAB — BRAIN NATRIURETIC PEPTIDE: B NATRIURETIC PEPTIDE 5: 78.9 pg/mL (ref 0.0–100.0)

## 2018-07-26 MED ORDER — SODIUM CHLORIDE 0.9 % IV BOLUS
500.0000 mL | Freq: Once | INTRAVENOUS | Status: AC
  Administered 2018-07-26: 500 mL via INTRAVENOUS

## 2018-07-26 MED ORDER — DEXTROSE 50 % IV SOLN
1.0000 | Freq: Once | INTRAVENOUS | Status: DC
  Filled 2018-07-26: qty 50

## 2018-07-26 MED ORDER — LORAZEPAM 2 MG/ML IJ SOLN
1.0000 mg | Freq: Once | INTRAMUSCULAR | Status: AC
  Administered 2018-07-26: 1 mg via INTRAVENOUS
  Filled 2018-07-26: qty 1

## 2018-07-26 MED ORDER — DEXTROSE 50 % IV SOLN
1.0000 | Freq: Once | INTRAVENOUS | Status: AC
  Administered 2018-07-26: 50 mL via INTRAVENOUS

## 2018-07-26 NOTE — ED Notes (Signed)
Pt. To MRI via stretcher. 

## 2018-07-26 NOTE — ED Triage Notes (Signed)
Pt BIBA for c/o swelling to bilateral feet that began on Friday when she got back from the beach;  pt states she noticed today around 12 pm  she woke up with numbness and weakness to the  Left leg   ; pt denies any CP or SOB; no facial droop noted , clear speech, equal bilateral hand grips

## 2018-07-26 NOTE — ED Notes (Signed)
Pure wick in place 

## 2018-07-27 LAB — CBG MONITORING, ED
GLUCOSE-CAPILLARY: 42 mg/dL — AB (ref 70–99)
GLUCOSE-CAPILLARY: 84 mg/dL (ref 70–99)

## 2018-07-27 MED ORDER — CEPHALEXIN 250 MG PO CAPS
500.0000 mg | ORAL_CAPSULE | Freq: Once | ORAL | Status: AC
  Administered 2018-07-27: 500 mg via ORAL
  Filled 2018-07-27: qty 2

## 2018-07-27 MED ORDER — CEPHALEXIN 500 MG PO CAPS
500.0000 mg | ORAL_CAPSULE | Freq: Three times a day (TID) | ORAL | 0 refills | Status: DC
Start: 2018-07-27 — End: 2018-08-11

## 2018-07-27 NOTE — ED Provider Notes (Signed)
[facility] EMERGENCY DEPARTMENT Provider Note   CSN: [id number] Arrival date & time: 07/26/18  1526     History   Chief Complaint Chief Complaint  Patient presents with  . Numbness  . Leg Swelling    HPI Valerie [name] is a 72 y.o. female.  HPI Patient is brought to the emergency department for increased swelling of her bilateral lower extremities over the past 4 to 5 days and around 12 PM today she woke up with reported numbness and weakness of the left leg.  No new back pain.  No weakness or numbness of her upper extremities.  No facial droop noted.  Speech is normal.  Spouse reports for several months she has had low blood pressure readings at the wound care center where she is having a abdominal wound managed with topical agents.  She has not had any blood pressure medication changes in several months.  She maintains on lisinopril and Norvasc.  She denies lightheadedness.  Denies chest pain shortness of breath.  Spouse reports that at the beach last week her feet were hanging off the couch for the majority the time which is abnormal for the patient and she normally has her feet elevated.  There was some dietary indiscretion.  No other recent changes in her medications.  Denies a prior history of stroke.  Mental status is normal per family.     Past Medical History:  Diagnosis Date  . Allergic rhinitis   . Anxiety   . Bifascicular block   . Chronic low back pain    FOLLOWED BY PAIN CLINIC  . Chronic narcotic use   . CKD (chronic kidney disease), stage III (HCC)   . Degenerative arthritis of spine   . Diabetic neuropathy (HCC)   . Dyspnea on exertion    due to back pain  . GERD (gastroesophageal reflux disease)   . Gross hematuria    10-19-17  . History of acute respiratory failure 1985   in ic[facility]   . History of pressure ulcer 2017   buttock ulcer is healed  . History of recurrent UTIs   . History of small bowel obstruction    01-21-2016  bowel  resection  . Hydronephrosis of right kidney 2017  . Hyperlipidemia   . Hypertension   . Pancreatitis 1985  . RBBB (right bundle branch block)   . Type 2 diabetes mellitus with insulin therapy [facility])    endocrinologist-  dr altheimer--  last A1c 5.9 on 04--2018  . Wound discharge    small open area between breasts changes banadaid qod quarter size small amount brown drainage present jan 2017  . Wound healing, delayed 01/15/2016   hx wound vac,s/p debridement fasciitis--  upper abdominal area , not open area just red w/ scar tissue dressing change every other day    Patient Active Problem List   Diagnosis Date Noted  . Hypokalemia 02/03/2016  . Anxiety state 02/03/2016  . Edema 02/03/2016  . Cellulitis 02/03/2016  . Acute respiratory failure with hypoxia (HCC) 01/31/2016  . Pressure ulcer 01/27/2016  . Hypomagnesemia 01/19/2016  . Small bowel obstruction (HCC) 01/17/2016  . Acute renal failure superimposed on stage 4 chronic kidney disease (HCC) 01/17/2016  . Anemia of chronic disease 01/17/2016  . Anemia associated with acute blood loss 01/17/2016  . Controlled type 2 diabetes mellitus with diabetic nephropathy, with long-term current use of insulin (HCC) 01/17/2016  . Morbid obesity due to excess calories (HCC) 01/17/2016  . Yeast  UTI 01/17/2016  . Incarcerated hernia 01/17/2016  . Dyslipidemia associated with type 2 diabetes mellitus (HCC) 01/17/2016  . Lower urinary tract infection 09/25/2014  . Essential hypertension, benign 03/07/2014  . Hyperlipidemia 09/12/2013    Past Surgical History:  Procedure Laterality Date  . CATARACT EXTRACTION W/ INTRAOCULAR LENS  IMPLANT, BILATERAL  2016  . CYSTOSCOPY W/ URETERAL STENT PLACEMENT Right 10/28/2017   Procedure: CYSTOSCOPY WITH STENT REPLACEMENT;  Surgeon: Bjorn Pippin, MD;  Location: [facility];  Service: Urology;  Laterality: Right;  . CYSTOSCOPY WITH BIOPSY N/A 04/15/2017   Procedure: CYSTOSCOPY WITH BIOPSY;   Surgeon: Bjorn Pippin, MD;  Location: [facility];  Service: Urology;  Laterality: N/A;  . CYSTOSCOPY WITH RETROGRADE PYELOGRAM, URETEROSCOPY AND STENT PLACEMENT Bilateral 04/02/2016   Procedure: CYSTO, RIGHT RETROGRADE,  RIGHT URETEROSCOPY AND STENT ;  Surgeon: Bjorn Pippin, MD;  Location: [facility];  Service: Urology;  Laterality: Bilateral;  . CYSTOSCOPY WITH RETROGRADE PYELOGRAM, URETEROSCOPY AND STENT PLACEMENT Right 04/15/2017   Procedure: CYSTOSCOPY WITH RIGHT RETROGRADE URETEROSCOPY AND RIGHT STENT PLACEMENT;  Surgeon: Bjorn Pippin, MD;  Location: [facility];  Service: Urology;  Laterality: Right;  . CYSTOSCOPY WITH STENT PLACEMENT Right 07/01/2017   Procedure: CYSTOSCOPY WITH RIGHT STENT EXCHANGE;  Surgeon: Bjorn Pippin, MD;  Location: [facility];  Service: Urology;  Laterality: Right;  . CYSTOSCOPY WITH STENT PLACEMENT Right 03/22/2018   Procedure: CYSTOSCOPY WITH RIGHT STENT EXCHANGE;  Surgeon: Bjorn Pippin, MD;  Location: [facility];  Service: Urology;  Laterality: Right;  . CYSTOSCOPY WITH URETEROSCOPY Right 04/21/2016   Procedure: CYSTOSCOPY WITH RIGHT URETEROSCOPY; RIGHT STENT EXCHANGE;  Surgeon: Bjorn Pippin, MD;  Location: [facility];  Service: Urology;  Laterality: Right;  . LAPAROTOMY N/A 01/15/2016   Procedure: EXPLORATORY LAPAROTOMY, LYSIS OF ADHESIONS, EXTENSIVE DEBRIDEMENT OF SKIN AND SUBCUTANEOUS TISSUE AND MUSCLE, REPAIR OF INCARCERATED INCISION HERNIA WITH BIOLOGIC MESH, APPLICATION OF NEGATIVE PRESSURE DRESSING;  Surgeon: Claud Kelp, MD;  Location: [facility];  Service: General;  Laterality: N/A;  . MULTIPLE TOOTH EXTRACTIONS    . 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 Authorizing Provider  amLODipine (NORVASC) 10 MG tablet TAKE 1 TABLET BY  MOUTH EVERY DAY Patient taking differently: TAKE 10 MG BY MOUTH DAILY 10/21/15  Yes Reather Littler, MD  atorvastatin (LIPITOR) 10 MG tablet TAKE 1 TABLET BY MOUTH DAILY Patient taking differently: TAKE 10 MG BY MOUTH DAILY 09/12/15  Yes Reather Littler, MD  celecoxib (CELEBREX) 200 MG capsule Take 200 mg by mouth every morning.   Yes [provider]  cephALEXin (KEFLEX) 500 MG capsule Take 500 mg by mouth at bedtime.   Yes [provider]  fenofibrate 160 MG tablet Take 160 mg by mouth daily. 05/24/18  Yes [provider]  gabapentin (NEURONTIN) 300 MG capsule Take 300 mg by mouth at bedtime.   Yes [provider]  Insulin Glargine (TOUJEO SOLOSTAR) 300 UNIT/ML SOPN Inject 35 Units into the skin daily. May adjust by 4 units every four days, targeting mostly under 140 before breakfast.   Yes [provider]  insulin lispro (HUMALOG) 100 UNIT/ML injection Inject into the skin 3 (three) times daily before meals. SSC :  Under 100 = 0 units,  100-150 = 4 units,  151-200 = 6 units,  201-250 = 8 units,  251-300 = 10 units,  Over 300 = 12 units   Yes [provider]  lisinopril (PRINIVIL,ZESTRIL) 5 MG tablet Take 5 mg by mouth daily.    Yes [provider]  mometasone (NASONEX) 50 MCG/ACT nasal spray Place 2 sprays into the nose daily as needed (allergies).   Yes [provider]  Multiple Vitamins-Minerals (ONE-A-DAY WOMENS 50 PLUS PO) Take by mouth every morning.   Yes [provider]  nystatin (MYCOSTATIN) 100000 UNIT/ML suspension Take 5 mLs by mouth 4 (four) times daily. Swish, gargle, and swallow 5ml 4 times daily. 07/11/18  Yes [provider]  Omega-3 Fatty Acids (FISH OIL) 1000 MG CAPS Take 1 capsule by mouth daily.    Yes [provider]  oxymorphone (OPANA ER) 20 MG 12 hr tablet Take 20 mg by mouth every 12 (twelve) hours. 07/11/18  Yes [provider]  oxymorphone (OPANA) 10 MG tablet Take 10 mg by  mouth 5 (five) times daily as needed for pain.    Yes [provider]  oxymorphone (OPANA ER) 30 MG 12 hr tablet Take 30 mg by mouth every 12 (twelve) hours. 07/25/18   [provider]    Family History Family History  Problem Relation Age of Onset  . Cancer Father        Prostate cancer  . Cancer Mother        Gynecologic    Social History Social History   Tobacco Use  . Smoking status: Former Smoker    Packs/day: 2.00    Years: 20.00    Pack years: 40.00    Types: Cigarettes    Last attempt to quit: 04/12/1984    Years since quitting: 34.3  . Smokeless tobacco: Never Used  Substance Use Topics  . Alcohol use: No  . Drug use: No     Allergies   Patient has no known allergies.   Review of Systems Review of Systems  All other systems reviewed and are negative.    Physical Exam Updated Vital Signs BP (!) 97/58   Pulse 81   Temp 98.8 F (37.1 C) (Oral)   Resp (!) 21   Ht 5\' 4"  (1.626 m)   Wt 108.9 kg (240 lb)   SpO2 91%   BMI 41.20 kg/m   Physical Exam  Constitutional: She is oriented to person, place, and time. She appears well-developed and well-nourished. No distress.  HENT:  Head: Normocephalic and atraumatic.  Eyes: Pupils are equal, round, and reactive to light. EOM are normal.  Neck: Normal range of motion.  Cardiovascular: Normal rate, regular rhythm and normal heart sounds.  Pulmonary/Chest: Effort normal and breath sounds normal.  Abdominal: Soft. She exhibits no distension. There is no tenderness.  Musculoskeletal: Normal range of motion. She exhibits edema. She exhibits no deformity.  Neurological: She is alert and oriented to person, place, and time.  5/5 strength in major muscle groups of  bilateral upper and lower extremities. Speech normal. No facial asymetry.   Skin: Skin is warm and dry.  Psychiatric: She has a normal mood and affect. Judgment normal.  Nursing note and vitals reviewed.    ED Treatments / Results    Labs (all labs ordered are listed, but only abnormal results are displayed) Labs Reviewed  CBC - Abnormal; Notable for the following components:      Result Value   RBC 3.82 (*)    Hemoglobin 10.4 (*)  HCT 33.2 (*)    Platelets 147 (*)    All other components within normal limits  COMPREHENSIVE METABOLIC PANEL - Abnormal; Notable for the following components:   BUN 50 (*)    Creatinine, Ser 2.22 (*)    Calcium 8.7 (*)    Total Protein 6.2 (*)    Albumin 2.8 (*)    GFR calc non Af Amer 21 (*)    GFR calc Af Amer 24 (*)    All other components within normal limits  URINALYSIS, ROUTINE W REFLEX MICROSCOPIC - Abnormal; Notable for the following components:   APPearance CLOUDY (*)    Hgb urine dipstick MODERATE (*)    Protein, ur 100 (*)    Leukocytes, UA LARGE (*)    WBC, UA >50 (*)    Bacteria, UA FEW (*)    All other components within normal limits  CBG MONITORING, ED - Abnormal; Notable for the following components:   Glucose-Capillary 113 (*)    All other components within normal limits  CBG MONITORING, ED - Abnormal; Notable for the following components:   Glucose-Capillary 42 (*)    All other components within normal limits  BRAIN NATRIURETIC PEPTIDE    EKG None  Radiology Dg Chest 1 View  Result Date: 07/26/2018 CLINICAL DATA:  Delayed wound healing.  Screen for MRI. EXAM: CHEST  1 VIEW COMPARISON:  March 15, 2016 FINDINGS: Stable cardiomegaly. The hila and mediastinum are unremarkable. Coarsened interstitium identified. Left retrocardiac opacity not excluded. No pneumothorax. No metal in the chest. IMPRESSION: 1. No metal in the chest identified on this MRI screening chest x-ray. 2. Left retrocardiac opacity not excluded. A PA and lateral chest x-ray could better evaluate. 3. Coarsened interstitium could represent mild edema versus bronchitic change. Electronically Signed   By: Gerome Sam III M.D   On: 07/26/2018 22:40   Ct Head Wo Contrast  Result Date:  07/26/2018 CLINICAL DATA:  Focal neuro deficit greater than 6 hours. Suspect stroke. EXAM: CT HEAD WITHOUT CONTRAST TECHNIQUE: Contiguous axial images were obtained from the base of the skull through the vertex without intravenous contrast. COMPARISON:  MRI head and CT head 05/13/2015 FINDINGS: Brain: Mild atrophy. Mild chronic ischemia in the white matter. 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 Negative for   hemorrhage, or mass. Hypodensity right posterior pons probably artifact. Hypodensity right medulla probably artifact. Vascular: Negative for acute vascular thrombosis. Skull: Negative Sinuses/Orbits: Paranasal sinuses clear. Bilateral cataract removal. Other: None IMPRESSION: Hypodensity right posterior pons and right medulla likely artifact. Acute or subacute infarct not excluded. No intracranial hemorrhage Mild chronic ischemic changes in the white matter and left parietal lobe. Electronically Signed   By: Marlan Palau M.D.   On: 07/26/2018 16:51   Mr Brain Wo Contrast  Result Date: 07/26/2018 CLINICAL DATA:  Initial evaluation for acute left leg numbness. EXAM: MRI HEAD WITHOUT CONTRAST TECHNIQUE: Multiplanar, multiecho pulse sequences of the brain and surrounding structures were obtained without intravenous contrast. COMPARISON:  Prior CT from earlier the same day. FINDINGS: Brain: Study severely degraded by motion artifact. Generalized age-related cerebral atrophy. Patchy T2/FLAIR hyperintensity within the periventricular and deep white matter both cerebral hemispheres most consistent with chronic small vessel ischemic disease, mild to moderate nature. Focus of encephalomalacia of within the posterior left temporal lobe consistent with a small remote cortical infarct (series 15, image 12). Associated chronic hemorrhagic blood products noted within this region. No other areas of chronic infarction identified on this motion degraded  exam. No definite abnormal foci of restricted  diffusion to suggest acute or subacute ischemia. Gray-white matter differentiation grossly maintained. No acute intracranial hemorrhage. No mass lesion, midline shift or mass effect. No hydrocephalus. No extra-axial fluid collection. Pituitary gland grossly normal. Vascular: Major intracranial vascular flow voids grossly maintained at the skull base. Skull and upper cervical spine: Craniocervical junction normal. Upper cervical spine grossly unremarkable. Bone marrow signal intensity within normal limits. No scalp soft tissue abnormality. Sinuses/Orbits: Globes and orbital soft tissues demonstrate no acute abnormality. Patient status post ocular lens replacement bilaterally paranasal sinuses are clear. Small bilateral mastoid effusions, of doubtful significance. Other: None. IMPRESSION: 1. Limited exam due to extensive motion artifact. 2. No definite acute intracranial abnormality. No appreciable infarct identified. 3. 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 4. Age-related cerebral atrophy with mild to moderate chronic small vessel ischemic disease. Electronically Signed   By: Rise Mu M.D.   On: 07/26/2018 23:47    Procedures Procedures (including critical care time)  Medications Ordered in ED Medications  sodium chloride 0.9 % bolus 500 mL (0 mLs Intravenous Stopped 07/26/18 1714)  sodium chloride 0.9 % bolus 500 mL (0 mLs Intravenous Stopped 07/26/18 1954)  sodium chloride 0.9 % bolus 500 mL (0 mLs Intravenous Stopped 07/26/18 2119)  dextrose 50 % solution 50 mL (50 mLs Intravenous Given 07/26/18 2119)  LORazepam (ATIVAN) injection 1 mg (1 mg Intravenous Given 07/26/18 2151)     Initial Impression / Assessment and Plan / ED Course  I have reviewed the triage vital signs and the nursing notes.  Pertinent labs & imaging results that were available during my care of the patient were reviewed by me and considered in my medical decision making (see chart for details).      Imaging in the emergency department demonstrates no acute stroke to explain the numbness and paresthesias of left lower extremity.  Doubt sciatica and neurologic symptoms coming from a lumbar or thoracic spine issue.  She does have mild elevation in her BUN and creatinine.  She has had low blood pressure for months.  This is likely related to her blood pressure meds including her lisinopril.  She will have her lisinopril and her Norvasc held.  She will follow-up with her primary care physician.  Of asked that her kidney function be rechecked on Friday.  Questionable appearing urine however no significant urinary symptoms.  Urine culture will be sent.  Patient be started on Keflex.  I do not believe this a presentation of urosepsis.  I do not believe her low blood pressure is related to infection.  Patient is overall well-appearing.  Family on board.  All questions answered.  Family and patient understand return to the emergency department for new or worsening symptoms.  Outpatient neurology follow-up regarding the paresthesias and numbness in the left lower extremity.  Primary care follow-up regarding her kidney function and recheck of her blood pressure.  She has a urologist as well who manages her chronic right hydronephrosis with ureteral stent exchanges.  She will follow-up with him as well.  Final Clinical Impressions(s) / ED Diagnoses   Final diagnoses:  Numbness and tingling of left leg  Renal insufficiency    ED Discharge Orders        Ordered    cephALEXin (KEFLEX) 500 MG capsule  3 times daily     07/27/18 0032       Azalia Bilis, MD 07/27/18 [phone]

## 2018-07-27 NOTE — Discharge Instructions (Addendum)
Please hold your Lisinopril  Please hold your Norvasc  Check your blood pressure twice daily  Call your doctor for follow up

## 2018-07-30 LAB — URINE CULTURE: Culture: >=100000 — AB

## 2018-07-31 ENCOUNTER — Telehealth: Payer: Self-pay | Admitting: Emergency Medicine

## 2018-07-31 ENCOUNTER — Telehealth (HOSPITAL_COMMUNITY): Payer: Self-pay | Admitting: Pharmacist

## 2018-07-31 NOTE — Telephone Encounter (Signed)
Post ED Visit - Positive Culture Follow-up: Successful Patient Follow-Up  Culture assessed and recommendations reviewed by:  []  Enzo BiNathan Batchelder, Pharm.D. []  Celedonio MiyamotoJeremy Frens, Pharm.D., BCPS AQ-ID []  Garvin FilaMike Maccia, Pharm.D., BCPS []  Georgina PillionElizabeth Martin, [address]Ph[address]D., BCPS []  Fort BridgerMinh Pham, [address]Pharm.D., BCPS, AAHIVP []  Estella HuskMichelle Turner, Pharm.D., BCPS, AAHIVP []  Lysle Pearlachel Rumbarger, PharmD, BCPS []  Phillips Climeshuy Dang, PharmD, BCPS [x]  Agapito GamesAlison Masters, PharmD, BCPS []  Verlan FriendsErin Deja, PharmD  Positive urine culture  []  Patient discharged without antimicrobial prescription and treatment is now indicated [x]  Organism is resistant to prescribed ED discharge antimicrobial []  Patient with positive blood cultures  Changes discussed with ED provider: Kerrie BuffaloHope Neese NP New antibiotic prescription stop keflex, start ciprofloxacin 500mg  po daily x 3 days [facility] [address] @ [phone]  Contacted patient, 07/31/2018 1226   Berle MullMiller, Sybrina Laning 07/31/2018, 12:25 PM

## 2018-07-31 NOTE — Progress Notes (Signed)
ED Antimicrobial Stewardship Positive Culture Follow Up   Valerie [name] is an 72 y.o. female who [facility] ([facility] with a chief complaint of No chief complaint on file.   [x]  Treated with Keflex, organism resistant to prescribed antimicrobial []  Patient discharged originally without antimicrobial agent and treatment is now indicated  New antibiotic prescription: Ciprofloxacin 500 mg po daily x 3 days  ED Provider: Mayer CamelH. Neese, NP   Heston Widener, Darl HouseholderAlison M 07/31/2018, 10:20 AM Clinical Pharmacist Monday - Friday phone -  [phone] Saturday - Sunday phone - [phone]

## 2018-08-03 ENCOUNTER — Encounter (HOSPITAL_BASED_OUTPATIENT_CLINIC_OR_DEPARTMENT_OTHER): Payer: Self-pay | Admitting: *Deleted

## 2018-08-08 ENCOUNTER — Other Ambulatory Visit: Payer: Self-pay

## 2018-08-08 ENCOUNTER — Other Ambulatory Visit: Payer: Self-pay | Admitting: Urology

## 2018-08-08 ENCOUNTER — Encounter (HOSPITAL_BASED_OUTPATIENT_CLINIC_OR_DEPARTMENT_OTHER): Payer: Self-pay

## 2018-08-08 NOTE — Pre-Procedure Instructions (Signed)
LMOM for [name] at Dr. Belva CromeWrenn's office that Ms. Valerie [name] currently has a pressure ulcer on her buttocks

## 2018-08-08 NOTE — Progress Notes (Signed)
Ms. Lindie SpruceWyatt stated on 08/08/18 that she currently has a pressure ulcer on her buttocks, and a delayed healing wound on her abdomen Spoke with: Meriam SpragueBeverly NPO:  After Midnight, no gum, candy, or mints   Arrival time: 0915AM Labs: Istat8, EKG AM medications: Atorvastatin, Escitalopram, Pantoprazole, Oxymorphone, Nasal spray if needed Pre op orders: Need second sign Ride home:  Freida Busmanllen (husband) [phone]

## 2018-08-10 ENCOUNTER — Ambulatory Visit: Payer: Medicare Other | Admitting: Neurology

## 2018-08-10 NOTE — H&P (Signed)
CC/HPI: [name] has quite an extensive medical history with chronic comorbidities. 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 This is managed with suppressive antibiotics as well as ureteral stent which gets exchanged every couple of months. Her last stent exchange was performed in late March. She admits to forgetting about follow-up appointment in June of this year. Her last urine culture here in this office was negative.   Seen in the emergency department a few days ago for increased bilateral lower extremity edema as well as new onset lower extremity paresthesia and weakness limiting her ability to stand. Her stroke workup was negative. She did have an elevated creatinine at 2.2 (up from 1.3 in late March) BUN was 50, white count 6.1. Review of notes indicates patient had been having issues with hypotension. It was thought her presentation was related to her blood pressure medications and therefore lisinopril and Norvasc was held. She followed up with her primary care provider yesterday who recommended holding the medications for an additional 3 weeks. Clinically the patient feels better since her ED visit and is able to ambulate much better on her own. A urine culture was sent and preliminarily has not resulted final as of this note but preliminarily she does have 100,000 gram-negative colonies growing with organism and sensitivities pending. She does report over the last couple of weeks noting an increased and urinary urgency with mild urge incontinence if unable to get to the restroom in time. Denies painful urination or blood in the urine. She has chronic right lower back and hip pain which has not changed. She is on pain management contract for this. Her cephalexin was increased by the ER physician to 500 mg 3 times a day which she continues. No upper track imaging performed during her ED visit.      ALLERGIES: No Allergies    MEDICATIONS: Keflex 500 mg capsule 1 capsule PO Q HS  Alprazolam 0.25 mg tablet Oral  Amlodipine Besylate 10 mg tablet Oral  Celecoxib  Escitalopram Oxalate 10 mg tablet  Fish Oil CAPS Oral  Flonase 50 mcg/actuation spray, suspension Nasal  Furosemide 20 mg tablet Oral  Humulin R 100 unit/ml vial Injection  Hydrochlorothiazide 12.5 mg tablet Oral  Lipitor 10 mg tablet Oral  Mucinex 600 mg tablet, extended release 12 hr Oral  Myrbetriq 50 mg tablet, extended release 24 hr Oral  Ondansetron Odt 4 mg tablet,disintegrating Oral  Oxymorphone Hcl Er 30 mg tablet, extended release 12 hr  Pantoprazole Sodium 40 mg tablet, delayed release Oral  Tizanidine Hcl 2 mg tablet Oral     Notes: She has increased the oxymorphone for her back.   GU PSH: Cysto Uretero Biopsy Fulgura - 2017 Cystoscopy Insert Stent, Right - 03/22/2018, Right - 10/28/2017, Right - 07/01/2017, Right - 04/15/2017, 2017, 2017 Cystoscopy Ureteroscopy, Right - 04/15/2017, 2017 Locm 300-399Mg /Ml Iodine,1Ml - 04/02/2017 Rpr Umbil Hern; Reduc < 5 Yr - 2017      PSH Notes: Epidural injections 2017   NON-GU PSH: Laparoscopy, Surgical; Repair Umbilical Hernia Pancreas Surgery (Unspecified) - 2017 Small bowel resection - 2017    GU PMH: Hydronephrosis Unspec (Worsening), She is overdue for a stent exchange and has some increased pain. I will get a urine culture and get her set up for the stent exchange next week. - 03/16/2018, She is due for a stent change in the next 4-6 weeks. I will get that set up. , - 09/15/2017,  She is doing well post stent exchanged. I will have her return in 3 months to arrange the next exchange. , - 07/14/2017 (Stable), Right, - 08/24/2016 (Improving), There is minimal residual right hydro noted on [place]. , 12-May-2016, Hydronephrosis, right, - 05/12/2016 Urinary Urgency, Her frequency and urgency are well controlled on Myrbetriq. - 07/14/2017 Gross hematuria, She has gross hematuria that appears to be  originating in the right kidney and she has chronic inflammation and obstruction of that kidney, I am going to get a cytology today and will get her set up for ureteroscopy and stenting to better evaluate the collecting system. With CKD3 I think it would be best if she is able to avoid right nephrectomy. I have reviewed the risks of the procedure in detail including bleeding, infection, ureteral injury, need for a stent and secondary procedures, thrombotic events and anesthetic complications. - 04/07/2017, (Acute), Culture urine. Will continue with daily suppression Cephalexin until culture finalized. If she has UTI now resistant to Cephalexin will change suppression. Will proceed with hematuria workup with CT and possible cysto , - 04/01/2017, Gross hematuria, - 05/12/2016 Proteinuria (Acute), With new onset Proteinuria in pt with diabetes and hypertension may need nephrology referral. Will check BUN/creatine today - 04/01/2017 Chronic cystitis (w/o hematuria) (Stable), Her UA looks infected but she just came of our antibiotic and is on amoxicillin. She is free of symptoms. May 12, 2016, Chronic cystitis, - May 12, 2016 Urinary Tract Inf, Unspec site, Pyuria - May 12, 2016 Incomplete bladder emptying, Feeling of incomplete bladder emptying - 05-12-2016 Postmenopausal atrophic vaginitis, Atrophic vaginitis - 05-12-2016    NON-GU PMH: Anxiety, Anxiety - May 12, 2016 Encounter for general adult medical examination without abnormal findings, Encounter for preventive health examination - 05/12/16 Personal history of other diseases of the circulatory system, History of hypertension - May 12, 2016 Personal history of other diseases of the digestive system, History of small bowel obstruction - 05/12/2016, History of pancreatitis, - 2016-05-12, History of esophageal reflux, - 05-12-2016 Personal history of other diseases of the musculoskeletal system and connective tissue, History of arthritis - 05/12/16 Personal history of other endocrine, nutritional and metabolic disease, History of diabetes  mellitus - 05/12/16, History of hypercholesterolemia, - 05/12/16    FAMILY HISTORY: Death of family member - Runs In Family Prostate Cancer - Runs In Family   SOCIAL HISTORY: Marital Status: Married Preferred Language: English; Ethnicity: Not Hispanic Or Latino; Race: White Current Smoking Status: Patient does not smoke anymore.  Does not use smokeless tobacco. Has never drank.  Does not use drugs. Drinks 1 caffeinated drink per day.    REVIEW OF SYSTEMS:    GU Review Female:   Patient denies frequent urination, hard to postpone urination, burning /pain with urination, get up at night to urinate, leakage of urine, stream starts and stops, trouble starting your stream, have to strain to urinate, and being pregnant.  Gastrointestinal (Upper):   Patient denies nausea, vomiting, and indigestion/ heartburn.  Gastrointestinal (Lower):   Patient denies diarrhea and constipation.  Constitutional:   Patient denies fever, night sweats, weight loss, and fatigue.  Skin:   Patient denies skin rash/ lesion and itching.  Eyes:   Patient denies blurred vision and double vision.  Ears/ Nose/ Throat:   Patient denies sore throat and sinus problems.  Hematologic/Lymphatic:   Patient denies swollen glands and easy bruising.  Cardiovascular:   Patient reports leg swelling. Patient denies chest pains.  Respiratory:   Patient denies cough and shortness of breath.  Endocrine:   Patient denies excessive  thirst.  Musculoskeletal:   Patient denies back pain and joint pain.  Neurological:   Patient denies headaches and dizziness.  Psychologic:   Patient denies anxiety and depression.   Notes: discuss stent change    VITAL SIGNS:      07/29/2018 11:27 AM  BP 92/59 mmHg  Heart Rate 142 /min  Temperature 98.4 F / 36.8 C   MULTI-SYSTEM PHYSICAL EXAMINATION:    Constitutional: Obese. No physical deformities. Normally developed. Good grooming.   Respiratory: No labored breathing, no use of accessory muscles. Normal  breath sounds.   Cardiovascular: Regular rate and rhythm. No murmur, no gallop. Normal temperature,   Neurologic / Psychiatric: Oriented to time, oriented to place, oriented to person. No depression, no anxiety, no agitation.  Gastrointestinal: Obese abdomen. No mass, no tenderness, no rigidity. C/D/i dressing overlying a small wound mid-line along the upper abdomen. She is being followed by wound care specialists for a poor healing wound r/t a prior hernia repair. There is no CVA or flank tenderness today.     PAST DATA REVIEWED:  Source Of History:  Patient, Family/Caregiver  Lab Test Review:   CBC with Diff, CMP  Records Review:   Previous Hospital Records, Previous Patient Records  Urine Test Review:   Urinalysis, Urine Culture   07/29/18  Urinalysis  Urine Appearance Turbid   Urine Color Yellow   Urine Glucose Neg mg/dL  Urine Bilirubin Neg mg/dL  Urine Ketones Neg mg/dL  Urine Specific Gravity 1.025   Urine Blood 3+ ery/uL  Urine pH 6.0   Urine Protein 3+ mg/dL  Urine Urobilinogen 0.2 mg/dL  Urine Nitrites Neg   Urine Leukocyte Esterase 3+ leu/uL  Urine WBC/hpf Packed/hpf   Urine RBC/hpf 3 - 10/hpf   Urine Epithelial Cells NS (Not Seen)   Urine Bacteria Rare (0-9/hpf)   Urine Mucous Not Present   Urine Yeast NS (Not Seen)   Urine Trichomonas Not Present   Urine Cystals NS (Not Seen)   Urine Casts NS (Not Seen)   Urine Sperm Not Present    PROCEDURES:          Urinalysis w/Scope - 81001 Dipstick Dipstick Cont'd Micro  Color: Yellow Bilirubin: Neg mg/dL WBC/hpf: Packed/hpf  Appearance: Turbid Ketones: Neg mg/dL RBC/hpf: 3 - 16/XWR10/hpf  Specific Gravity: 1.025 Blood: 3+ ery/uL Bacteria: Rare (0-9/hpf)  pH: 6.0 Protein: 3+ mg/dL Cystals: NS (Not Seen)  Glucose: Neg mg/dL Urobilinogen: 0.2 mg/dL Casts: NS (Not Seen)    Nitrites: Neg Trichomonas: Not Present    Leukocyte Esterase: 3+ leu/uL Mucous: Not Present      Epithelial Cells: NS (Not Seen)      Yeast: NS (Not Seen)       Sperm: Not Present    Notes: Microscopic done on unconcentrated urine    ASSESSMENT:      ICD-10 Details  1 GU:   Chronic cystitis (w/o hematuria) - N30.20   2   Hydronephrosis Unspec - N13.30    PLAN:           Schedule Return Visit/Planned Activity: Next Available Appointment - Schedule Surgery          Document Letter(s):  Created for Patient: Clinical Summary         Notes:   She'll continue holding Norvasc and lisinopril for the next 3 weeks. This likely was contributing to worsening lower extremity edema and leg weakening as well as worsening renal function. Urine culture pending and positive for gram-negative rods. No organisms or  sensitivity reported yet, I'll keep an eye on this throughout the day and if final result occurs before close of clinic, I'll contact the patient with new antimicrobial regimen if indicated. Until then she'll keep taking Keflex as prescribed. She is overdue for stent exchange, I'll send a message to her urologist regarding that as well.

## 2018-08-11 ENCOUNTER — Ambulatory Visit (HOSPITAL_BASED_OUTPATIENT_CLINIC_OR_DEPARTMENT_OTHER): Payer: Medicare Other | Admitting: Anesthesiology

## 2018-08-11 ENCOUNTER — Encounter (HOSPITAL_BASED_OUTPATIENT_CLINIC_OR_DEPARTMENT_OTHER)
Admission: RE | Disposition: A | Discharge status: Home / Self Care | Payer: Self-pay | Source: Ambulatory Visit | Attending: Urology

## 2018-08-11 ENCOUNTER — Encounter (HOSPITAL_BASED_OUTPATIENT_CLINIC_OR_DEPARTMENT_OTHER): Payer: Self-pay | Admitting: Anesthesiology

## 2018-08-11 ENCOUNTER — Other Ambulatory Visit: Payer: Self-pay

## 2018-08-11 ENCOUNTER — Ambulatory Visit (HOSPITAL_BASED_OUTPATIENT_CLINIC_OR_DEPARTMENT_OTHER)
Admission: RE | Admit: 2018-08-11 | Discharge: 2018-08-11 | Disposition: A | Discharge status: Home / Self Care | Payer: Medicare Other | Source: Ambulatory Visit | Attending: Urology | Admitting: Urology

## 2018-08-11 DIAGNOSIS — I10 Essential (primary) hypertension: Secondary | ICD-10-CM | POA: Diagnosis not present

## 2018-08-11 DIAGNOSIS — E7800 Pure hypercholesterolemia, unspecified: Secondary | ICD-10-CM | POA: Insufficient documentation

## 2018-08-11 DIAGNOSIS — R600 Localized edema: Secondary | ICD-10-CM | POA: Diagnosis not present

## 2018-08-11 DIAGNOSIS — M545 Low back pain: Secondary | ICD-10-CM | POA: Insufficient documentation

## 2018-08-11 DIAGNOSIS — Z87891 Personal history of nicotine dependence: Secondary | ICD-10-CM | POA: Insufficient documentation

## 2018-08-11 DIAGNOSIS — E119 Type 2 diabetes mellitus without complications: Secondary | ICD-10-CM | POA: Diagnosis not present

## 2018-08-11 DIAGNOSIS — G8929 Other chronic pain: Secondary | ICD-10-CM | POA: Diagnosis not present

## 2018-08-11 DIAGNOSIS — D649 Anemia, unspecified: Secondary | ICD-10-CM | POA: Insufficient documentation

## 2018-08-11 DIAGNOSIS — Z466 Encounter for fitting and adjustment of urinary device: Secondary | ICD-10-CM | POA: Diagnosis not present

## 2018-08-11 DIAGNOSIS — Z79899 Other long term (current) drug therapy: Secondary | ICD-10-CM | POA: Insufficient documentation

## 2018-08-11 DIAGNOSIS — Z794 Long term (current) use of insulin: Secondary | ICD-10-CM | POA: Diagnosis not present

## 2018-08-11 DIAGNOSIS — M25551 Pain in right hip: Secondary | ICD-10-CM | POA: Insufficient documentation

## 2018-08-11 DIAGNOSIS — Z791 Long term (current) use of non-steroidal anti-inflammatories (NSAID): Secondary | ICD-10-CM | POA: Insufficient documentation

## 2018-08-11 DIAGNOSIS — K219 Gastro-esophageal reflux disease without esophagitis: Secondary | ICD-10-CM | POA: Insufficient documentation

## 2018-08-11 DIAGNOSIS — N136 Pyonephrosis: Secondary | ICD-10-CM | POA: Diagnosis not present

## 2018-08-11 DIAGNOSIS — Z7951 Long term (current) use of inhaled steroids: Secondary | ICD-10-CM | POA: Diagnosis not present

## 2018-08-11 DIAGNOSIS — R531 Weakness: Secondary | ICD-10-CM | POA: Insufficient documentation

## 2018-08-11 DIAGNOSIS — Z79891 Long term (current) use of opiate analgesic: Secondary | ICD-10-CM | POA: Insufficient documentation

## 2018-08-11 DIAGNOSIS — Z6841 Body Mass Index (BMI) 40.0 and over, adult: Secondary | ICD-10-CM | POA: Diagnosis not present

## 2018-08-11 HISTORY — PX: CYSTOSCOPY W/ URETERAL STENT PLACEMENT: SHX1429

## 2018-08-11 HISTORY — DX: Anemia, unspecified: D64.9

## 2018-08-11 HISTORY — DX: Mixed hyperlipidemia: E78.2

## 2018-08-11 HISTORY — DX: Personal history of other diseases of the digestive system: Z87.19

## 2018-08-11 HISTORY — DX: Presence of spectacles and contact lenses: Z97.3

## 2018-08-11 HISTORY — DX: Hypotension, unspecified: I95.9

## 2018-08-11 HISTORY — DX: Morbid (severe) obesity due to excess calories: E66.01

## 2018-08-11 LAB — POCT I-STAT, CHEM 8
BUN: 30 mg/dL — ABNORMAL HIGH (ref 8–23)
CALCIUM ION: 1.2 mmol/L (ref 1.15–1.40)
Chloride: 104 mmol/L (ref 98–111)
Creatinine, Ser: 1.4 mg/dL — ABNORMAL HIGH (ref 0.44–1.00)
Glucose, Bld: 145 mg/dL — ABNORMAL HIGH (ref 70–99)
HEMATOCRIT: 32 % — AB (ref 36.0–46.0)
Hemoglobin: 10.9 g/dL — ABNORMAL LOW (ref 12.0–15.0)
Potassium: 4.5 mmol/L (ref 3.5–5.1)
SODIUM: 142 mmol/L (ref 135–145)
TCO2: 26 mmol/L (ref 22–32)

## 2018-08-11 LAB — GLUCOSE, CAPILLARY: GLUCOSE-CAPILLARY: 175 mg/dL — AB (ref 70–99)

## 2018-08-11 SURGERY — CYSTOSCOPY, FLEXIBLE, WITH STENT REPLACEMENT
Anesthesia: Monitor Anesthesia Care | Laterality: Right

## 2018-08-11 MED ORDER — HYDROMORPHONE HCL 1 MG/ML IJ SOLN
0.2500 mg | INTRAMUSCULAR | Status: DC | PRN
  Filled 2018-08-11: qty 0.5

## 2018-08-11 MED ORDER — OXYCODONE HCL 5 MG/5ML PO SOLN
5.0000 mg | Freq: Once | ORAL | Status: DC | PRN
  Filled 2018-08-11: qty 5

## 2018-08-11 MED ORDER — DEXAMETHASONE SODIUM PHOSPHATE 10 MG/ML IJ SOLN
INTRAMUSCULAR | Status: DC | PRN
  Administered 2018-08-11: 5 mg via INTRAVENOUS

## 2018-08-11 MED ORDER — CIPROFLOXACIN IN D5W 400 MG/200ML IV SOLN
INTRAVENOUS | Status: AC
  Filled 2018-08-11: qty 200

## 2018-08-11 MED ORDER — SODIUM CHLORIDE 0.9 % IV SOLN
INTRAVENOUS | Status: DC
  Administered 2018-08-11: 10:00:00 via INTRAVENOUS
  Filled 2018-08-11: qty 1000

## 2018-08-11 MED ORDER — DEXAMETHASONE SODIUM PHOSPHATE 10 MG/ML IJ SOLN
INTRAMUSCULAR | Status: AC
  Filled 2018-08-11: qty 1

## 2018-08-11 MED ORDER — ACETAMINOPHEN 650 MG RE SUPP
650.0000 mg | RECTAL | Status: DC | PRN
  Filled 2018-08-11: qty 1

## 2018-08-11 MED ORDER — PROMETHAZINE HCL 25 MG/ML IJ SOLN
6.2500 mg | INTRAMUSCULAR | Status: DC | PRN
  Filled 2018-08-11: qty 1

## 2018-08-11 MED ORDER — PROPOFOL 10 MG/ML IV BOLUS
INTRAVENOUS | Status: AC
  Filled 2018-08-11: qty 20

## 2018-08-11 MED ORDER — LIDOCAINE 2% (20 MG/ML) 5 ML SYRINGE
INTRAMUSCULAR | Status: DC | PRN
  Administered 2018-08-11: 80 mg via INTRAVENOUS

## 2018-08-11 MED ORDER — LIDOCAINE 2% (20 MG/ML) 5 ML SYRINGE
INTRAMUSCULAR | Status: AC
  Filled 2018-08-11: qty 5

## 2018-08-11 MED ORDER — MORPHINE SULFATE (PF) 2 MG/ML IV SOLN
1.0000 mg | INTRAVENOUS | Status: DC | PRN
  Filled 2018-08-11: qty 0.5

## 2018-08-11 MED ORDER — CIPROFLOXACIN IN D5W 400 MG/200ML IV SOLN
400.0000 mg | INTRAVENOUS | Status: AC
  Administered 2018-08-11: 400 mg via INTRAVENOUS
  Filled 2018-08-11: qty 200

## 2018-08-11 MED ORDER — FENTANYL CITRATE (PF) 100 MCG/2ML IJ SOLN
INTRAMUSCULAR | Status: AC
  Filled 2018-08-11: qty 2

## 2018-08-11 MED ORDER — SODIUM CHLORIDE 0.9 % IV SOLN
250.0000 mL | INTRAVENOUS | Status: DC | PRN
  Filled 2018-08-11: qty 250

## 2018-08-11 MED ORDER — FENTANYL CITRATE (PF) 100 MCG/2ML IJ SOLN
INTRAMUSCULAR | Status: DC | PRN
  Administered 2018-08-11: 50 ug via INTRAVENOUS
  Administered 2018-08-11: 25 ug via INTRAVENOUS

## 2018-08-11 MED ORDER — PROPOFOL 500 MG/50ML IV EMUL
INTRAVENOUS | Status: DC | PRN
  Administered 2018-08-11: 150 ug/kg/min via INTRAVENOUS

## 2018-08-11 MED ORDER — OXYCODONE HCL 5 MG PO TABS
5.0000 mg | ORAL_TABLET | Freq: Once | ORAL | Status: DC | PRN
  Filled 2018-08-11: qty 1

## 2018-08-11 MED ORDER — ONDANSETRON HCL 4 MG/2ML IJ SOLN
INTRAMUSCULAR | Status: AC
  Filled 2018-08-11: qty 2

## 2018-08-11 MED ORDER — SODIUM CHLORIDE 0.9% FLUSH
3.0000 mL | Freq: Two times a day (BID) | INTRAVENOUS | Status: DC
  Filled 2018-08-11: qty 3

## 2018-08-11 MED ORDER — STERILE WATER FOR IRRIGATION IR SOLN
Status: DC | PRN
  Administered 2018-08-11: 3000 mL

## 2018-08-11 MED ORDER — OXYCODONE HCL 5 MG PO TABS
5.0000 mg | ORAL_TABLET | ORAL | Status: DC | PRN
  Filled 2018-08-11: qty 2

## 2018-08-11 MED ORDER — SODIUM CHLORIDE 0.9% FLUSH
3.0000 mL | INTRAVENOUS | Status: DC | PRN
  Filled 2018-08-11: qty 3

## 2018-08-11 MED ORDER — ONDANSETRON HCL 4 MG/2ML IJ SOLN
INTRAMUSCULAR | Status: DC | PRN
  Administered 2018-08-11: 4 mg via INTRAVENOUS

## 2018-08-11 MED ORDER — ACETAMINOPHEN 325 MG PO TABS
650.0000 mg | ORAL_TABLET | ORAL | Status: DC | PRN
  Filled 2018-08-11: qty 2

## 2018-08-11 SURGICAL SUPPLY — 29 items
BAG DRAIN URO-CYSTO SKYTR STRL (DRAIN) ×3 IMPLANT
BASKET STONE 1.7 NGAGE (UROLOGICAL SUPPLIES) IMPLANT
BASKET ZERO TIP NITINOL 2.4FR (BASKET) IMPLANT
CATH URET 5FR 28IN CONE TIP (BALLOONS)
CATH URET 5FR 28IN OPEN ENDED (CATHETERS) IMPLANT
CATH URET 5FR 70CM CONE TIP (BALLOONS) IMPLANT
CLOTH BEACON ORANGE TIMEOUT ST (SAFETY) ×3 IMPLANT
ELECT REM PT RETURN 9FT ADLT (ELECTROSURGICAL)
ELECTRODE REM PT RTRN 9FT ADLT (ELECTROSURGICAL) IMPLANT
FIBER LASER FLEXIVA 365 (UROLOGICAL SUPPLIES) IMPLANT
FIBER LASER TRAC TIP (UROLOGICAL SUPPLIES) IMPLANT
GLOVE BIOGEL PI IND STRL 8.5 (GLOVE) ×2 IMPLANT
GLOVE BIOGEL PI INDICATOR 8.5 (GLOVE) ×4
GLOVE INDICATOR 8.5 STRL (GLOVE) ×3 IMPLANT
GLOVE SURG SS PI 8.0 STRL IVOR (GLOVE) ×3 IMPLANT
GOWN STRL REUS W/TWL XL LVL3 (GOWN DISPOSABLE) ×3 IMPLANT
GUIDEWIRE 0.038 PTFE COATED (WIRE) IMPLANT
GUIDEWIRE ANG ZIPWIRE 038X150 (WIRE) IMPLANT
GUIDEWIRE STR DUAL SENSOR (WIRE) ×3 IMPLANT
INFUSOR MANOMETER BAG 3000ML (MISCELLANEOUS) ×3 IMPLANT
IV NS IRRIG 3000ML ARTHROMATIC (IV SOLUTION) ×3 IMPLANT
KIT TURNOVER CYSTO (KITS) ×3 IMPLANT
MANIFOLD NEPTUNE II (INSTRUMENTS) ×3 IMPLANT
NS IRRIG 500ML POUR BTL (IV SOLUTION) ×3 IMPLANT
PACK CYSTO (CUSTOM PROCEDURE TRAY) ×3 IMPLANT
STENT URET 6FRX24 CONTOUR (STENTS) ×3 IMPLANT
TUBE CONNECTING 12'X1/4 (SUCTIONS)
TUBE CONNECTING 12X1/4 (SUCTIONS) IMPLANT
TUBING UROLOGY SET (TUBING) IMPLANT

## 2018-08-11 NOTE — Op Note (Signed)
Procedure: Cystoscopy with removal and replacement of right double-J stent.  Preop diagnosis: Chronic right ureteral obstruction.  Postop diagnosis: Same.  Surgeon: Dr. [name].  Anesthesia: MAC.  Drain: 6 [place] by 24 cm contour double-J stent.  Specimen: None.  EBL: None.  Complications: None.  Indications: Valerie [name] is a 72 year old white female with chronic right ureteral obstruction managed with stent exchanges.  She returns for routine stent exchange today.  Procedure: She was given Cipro.  She was placed in the lithotomy position and fitted with PAS hose.  Sedation was given as needed.  Her perineum and genitalia were prepped with Betadine solution and she was draped in usual sterile fashion.  Left in the labia to place the cystoscope because of her large panniculus some labial adhesions tore anteriorly despite efforts to be gentle and there was a little bleeding in this area.  The 13 [place] scope with 30 degree lens was passed without difficulty.  Inspection revealed a normal urethra.  The bladder wall was smooth with some patchy erythema from chronic inflammation and the urine was turbid with some debris around the stent loop on the right.  The left ureteral orifice was unremarkable.  The stent was grasped with a grasping forceps and pulled the urethral meatus.  A sensor guidewire was advanced through the stent to the kidney under fluoroscopic guidance.  The stent was removed and the cystoscope was reinserted over the wire.  A fresh 6 [place] by 24 cm contour double-J stent was then advanced over the wire to the kidney under fluoroscopic guidance and the wire was removed.  A good coil was noted in the kidney and the bladder.  The bladder was drained and the cystoscope was removed.  She was taken down from lithotomy position and moved to recovery in stable condition.  There were no complications.

## 2018-08-11 NOTE — Anesthesia Procedure Notes (Signed)
Procedure Name: MAC Date/Time: 08/11/2018 11:14 AM Performed by: Lynda Rainwater, MD Pre-anesthesia Checklist: Patient identified, Emergency Drugs available, Suction available, Patient being monitored and Timeout performed Oxygen Delivery Method: Nasal cannula Placement Confirmation: positive ETCO2 and breath sounds checked- equal and bilateral

## 2018-08-11 NOTE — Transfer of Care (Signed)
Immediate Anesthesia Transfer of Care Note  Patient: GAILYA TAUER  Procedure(s) Performed: CYSTOSCOPY WITH RIGHT STENT EXCHANGE (Right )  Patient Location: PACU  Anesthesia Type:MAC  Level of Consciousness: sedated and patient cooperative  Airway & Oxygen Therapy: Patient Spontanous Breathing and Patient connected to nasal cannula oxygen  Post-op Assessment: Report given to RN and Post -op Vital signs reviewed and stable  Post vital signs: Reviewed and stable  Last Vitals:  Vitals Value Taken Time  BP 111/62 08/11/2018 11:45 AM  Temp    Pulse    Resp 12 08/11/2018 11:44 AM  SpO2    Vitals shown include unvalidated device data.  Last Pain:  Vitals:   08/11/18 0932  TempSrc: Oral         Complications: No apparent anesthesia complications

## 2018-08-11 NOTE — Discharge Instructions (Addendum)
Ureteral Stent Implantation, Care After °Refer to this sheet in the next few weeks. These instructions provide you with information about caring for yourself after your procedure. Your health care provider may also give you more specific instructions. Your treatment has been planned according to current medical practices, but problems sometimes occur. Call your health care provider if you have any problems or questions after your procedure. °What can I expect after the procedure? °After the procedure, it is common to have: °· Nausea. °· Mild pain when you urinate. You may feel this pain in your lower back or lower abdomen. Pain should stop within a few minutes after you urinate. This may last for up to 1 week. °· A small amount of blood in your urine for several days. ° °Follow these instructions at home: ° °Medicines °· Take over-the-counter and prescription medicines only as told by your health care provider. °· If you were prescribed an antibiotic medicine, take it as told by your health care provider. Do not stop taking the antibiotic even if you start to feel better. °· Do not drive for 24 hours if you received a sedative. °· Do not drive or operate heavy machinery while taking prescription pain medicines. °Activity °· Return to your normal activities as told by your health care provider. Ask your health care provider what activities are safe for you. °· Do not lift anything that is heavier than 10 lb (4.5 kg). Follow this limit for 1 week after your procedure, or for as long as told by your health care provider. °General instructions °· Watch for any blood in your urine. Call your health care provider if the amount of blood in your urine increases. °· If you have a catheter: °? Follow instructions from your health care provider about taking care of your catheter and collection bag. °? Do not take baths, swim, or use a hot tub until your health care provider approves. °· Drink enough fluid to keep your urine  clear or pale yellow. °· Keep all follow-up visits as told by your health care provider. This is important. °Contact a health care provider if: °· You have pain that gets worse or does not get better with medicine, especially pain when you urinate. °· You have difficulty urinating. °· You feel nauseous or you vomit repeatedly during a period of more than 2 days after the procedure. °Get help right away if: °· Your urine is dark red or has blood clots in it. °· You are leaking urine (have incontinence). °· The end of the stent comes out of your urethra. °· You cannot urinate. °· You have sudden, sharp, or severe pain in your abdomen or lower back. °· You have a fever. °This information is not intended to replace advice given to you by your health care provider. Make sure you discuss any questions you have with your health care provider. °Document Released: 08/16/2013 Document Revised: 05/21/2016 Document Reviewed: 06/28/2015 °Elsevier Interactive Patient Education © 2018 [employer] ° ° °Post Anesthesia Home Care Instructions ° °Activity: °Get plenty of rest for the remainder of the day. A responsible individual must stay with you for 24 hours following the procedure.  °For the next 24 hours, DO NOT: °-Drive a car °-Operate machinery °-Drink alcoholic beverages °-Take any medication unless instructed by your physician °-Make any legal decisions or sign important papers. ° °Meals: °Start with liquid foods such as gelatin or soup. Progress to regular foods as tolerated. Avoid greasy, spicy, heavy foods. If nausea   and/or vomiting occur, drink only clear liquids until the nausea and/or vomiting subsides. Call your physician if vomiting continues. ° °Special Instructions/Symptoms: °Your throat may feel dry or sore from the anesthesia or the breathing tube placed in your throat during surgery. If this causes discomfort, gargle with warm salt water. The discomfort should disappear within 24 hours. ° °If you had a  scopolamine patch placed behind your ear for the management of post- operative nausea and/or vomiting: ° °1. The medication in the patch is effective for 72 hours, after which it should be removed.  Wrap patch in a tissue and discard in the trash. Wash hands thoroughly with soap and water. °2. You may remove the patch earlier than 72 hours if you experience unpleasant side effects which may include dry mouth, dizziness or visual disturbances. °3. Avoid touching the patch. Wash your hands with soap and water after contact with the patch. °  ° °

## 2018-08-11 NOTE — Interval H&P Note (Signed)
History and Physical Interval Note:  Pt has a pressure sore that is preexisting.   08/11/2018 10:52 AM  Valerie [name]  has presented today for surgery, with the diagnosis of RIGHT HYDRONEPHROSIS  The various methods of treatment have been discussed with the patient and family. After consideration of risks, benefits and other options for treatment, the patient has consented to  Procedure(s): CYSTOSCOPY WITH RIGHT STENT EXCHANGE (Right) as a surgical intervention .  The patient's history has been reviewed, patient examined, no change in status, stable for surgery.  I have reviewed the patient's chart and labs.  Questions were answered to the patient's satisfaction.     Valerie [name]

## 2018-08-11 NOTE — Anesthesia Postprocedure Evaluation (Signed)
Anesthesia Post Note  Patient: Valerie [name]  Procedure(s) Performed: CYSTOSCOPY WITH RIGHT STENT EXCHANGE (Right )     Patient location during evaluation: PACU Anesthesia Type: MAC Level of consciousness: awake Pain management: pain level controlled Vital Signs Assessment: post-procedure vital signs reviewed and stable Respiratory status: spontaneous breathing Cardiovascular status: stable Postop Assessment: no apparent nausea or vomiting Anesthetic complications: no    Last Vitals:  Vitals:   08/11/18 1200 08/11/18 1215  BP: (!) 141/56 133/61  Pulse: 75 73  Resp: (!) 21 16  Temp:    SpO2: 100% 97%    Last Pain:  Vitals:   08/11/18 1300  TempSrc:   PainSc: 0-No pain   Pain Goal:                 Jaheim Canino JR,JOHN Dequavion Follette

## 2018-08-11 NOTE — Anesthesia Preprocedure Evaluation (Addendum)
Anesthesia Evaluation    Airway Mallampati: II  TM Distance: >3 FB Neck ROM: Full    Dental no notable dental hx.    Pulmonary former smoker,    Pulmonary exam normal breath sounds clear to auscultation       Cardiovascular hypertension, Pt. on medications Normal cardiovascular exam Rhythm:Regular Rate:Normal     Neuro/Psych Anxiety    GI/Hepatic GERD  Medicated,  Endo/Other  diabetes, Type 2, Insulin DependentMorbid obesity  Renal/GU Renal InsufficiencyRenal disease     Musculoskeletal  (+) Arthritis , Osteoarthritis,    Abdominal (+) + obese,   Peds  Hematology   Anesthesia Other Findings   Reproductive/Obstetrics                                                             Anesthesia Evaluation  Patient identified by MRN, date of birth, ID band Patient awake    Reviewed: Allergy & Precautions, NPO status , Patient's Chart, lab work & pertinent test results  Airway Mallampati: III  TM Distance: >3 FB Neck ROM: Full    Dental no notable dental hx.    Pulmonary neg pulmonary ROS, former smoker,    breath sounds clear to auscultation + decreased breath sounds      Cardiovascular hypertension, Normal cardiovascular exam+ dysrhythmias  Rhythm:Regular Rate:Normal     Neuro/Psych Anxiety negative neurological ROS     GI/Hepatic Neg liver ROS, GERD  ,  Endo/Other  diabetes, Insulin DependentMorbid obesity  Renal/GU Renal disease     Musculoskeletal negative musculoskeletal ROS (+) Arthritis ,   Abdominal   Peds  Hematology negative hematology ROS (+) anemia ,   Anesthesia Other Findings   Reproductive/Obstetrics negative OB ROS                             Anesthesia Physical  Anesthesia Plan  ASA: III  Anesthesia Plan: MAC   Post-op Pain Management:    Induction: Intravenous  PONV Risk Score and Plan: 3 and Ondansetron,  Treatment may vary due to age or medical condition, Propofol infusion and Dexamethasone  Airway Management Planned:   Additional Equipment:   Intra-op Plan:   Post-operative Plan:   Informed Consent: I have reviewed the patients History and Physical, chart, labs and discussed the procedure including the risks, benefits and alternatives for the proposed anesthesia with the patient or authorized representative who has indicated his/her understanding and acceptance.   Dental advisory given  Plan Discussed with: CRNA  Anesthesia Plan Comments:         Anesthesia Quick Evaluation  Anesthesia Physical Anesthesia Plan  ASA: III  Anesthesia Plan: MAC   Post-op Pain Management:    Induction: Intravenous  PONV Risk Score and Plan: 3 and Ondansetron, Dexamethasone and Midazolam  Airway Management Planned: LMA  Additional Equipment:   Intra-op Plan:   Post-operative Plan: Extubation in OR  Informed Consent: I have reviewed the patients History and Physical, chart, labs and discussed the procedure including the risks, benefits and alternatives for the proposed anesthesia with the patient or authorized representative who has indicated his/her understanding and acceptance.   Dental advisory given  Plan Discussed with: CRNA  Anesthesia Plan Comments:        Anesthesia Quick Evaluation

## 2018-08-12 ENCOUNTER — Encounter (HOSPITAL_BASED_OUTPATIENT_CLINIC_OR_DEPARTMENT_OTHER): Payer: Self-pay | Admitting: Urology

## 2018-08-24 ENCOUNTER — Encounter (HOSPITAL_BASED_OUTPATIENT_CLINIC_OR_DEPARTMENT_OTHER): Payer: Medicare Other | Attending: Internal Medicine

## 2018-08-24 DIAGNOSIS — E1140 Type 2 diabetes mellitus with diabetic neuropathy, unspecified: Secondary | ICD-10-CM | POA: Diagnosis not present

## 2018-08-24 DIAGNOSIS — T8189XA Other complications of procedures, not elsewhere classified, initial encounter: Secondary | ICD-10-CM | POA: Diagnosis not present

## 2018-08-24 DIAGNOSIS — L97812 Non-pressure chronic ulcer of other part of right lower leg with fat layer exposed: Secondary | ICD-10-CM | POA: Insufficient documentation

## 2018-08-24 DIAGNOSIS — Z87891 Personal history of nicotine dependence: Secondary | ICD-10-CM | POA: Insufficient documentation

## 2018-08-24 DIAGNOSIS — Z6841 Body Mass Index (BMI) 40.0 and over, adult: Secondary | ICD-10-CM | POA: Diagnosis not present

## 2018-08-24 DIAGNOSIS — Z794 Long term (current) use of insulin: Secondary | ICD-10-CM | POA: Diagnosis not present

## 2018-08-24 DIAGNOSIS — N183 Chronic kidney disease, stage 3 (moderate): Secondary | ICD-10-CM | POA: Insufficient documentation

## 2018-08-24 DIAGNOSIS — E1122 Type 2 diabetes mellitus with diabetic chronic kidney disease: Secondary | ICD-10-CM | POA: Insufficient documentation

## 2018-08-24 DIAGNOSIS — E11622 Type 2 diabetes mellitus with other skin ulcer: Secondary | ICD-10-CM | POA: Insufficient documentation

## 2018-08-24 DIAGNOSIS — I129 Hypertensive chronic kidney disease with stage 1 through stage 4 chronic kidney disease, or unspecified chronic kidney disease: Secondary | ICD-10-CM | POA: Diagnosis not present

## 2018-08-24 DIAGNOSIS — Y838 Other surgical procedures as the cause of abnormal reaction of the patient, or of later complication, without mention of misadventure at the time of the procedure: Secondary | ICD-10-CM | POA: Diagnosis not present

## 2018-08-25 DIAGNOSIS — E11622 Type 2 diabetes mellitus with other skin ulcer: Secondary | ICD-10-CM | POA: Diagnosis not present

## 2018-08-31 ENCOUNTER — Encounter (HOSPITAL_BASED_OUTPATIENT_CLINIC_OR_DEPARTMENT_OTHER): Payer: Medicare Other | Attending: Physician Assistant

## 2018-08-31 DIAGNOSIS — E11622 Type 2 diabetes mellitus with other skin ulcer: Secondary | ICD-10-CM | POA: Insufficient documentation

## 2018-08-31 DIAGNOSIS — Z6841 Body Mass Index (BMI) 40.0 and over, adult: Secondary | ICD-10-CM | POA: Insufficient documentation

## 2018-08-31 DIAGNOSIS — T8189XA Other complications of procedures, not elsewhere classified, initial encounter: Secondary | ICD-10-CM | POA: Diagnosis not present

## 2018-08-31 DIAGNOSIS — L97812 Non-pressure chronic ulcer of other part of right lower leg with fat layer exposed: Secondary | ICD-10-CM | POA: Insufficient documentation

## 2018-08-31 DIAGNOSIS — E1140 Type 2 diabetes mellitus with diabetic neuropathy, unspecified: Secondary | ICD-10-CM | POA: Insufficient documentation

## 2018-08-31 DIAGNOSIS — Y838 Other surgical procedures as the cause of abnormal reaction of the patient, or of later complication, without mention of misadventure at the time of the procedure: Secondary | ICD-10-CM | POA: Insufficient documentation

## 2018-08-31 DIAGNOSIS — E1122 Type 2 diabetes mellitus with diabetic chronic kidney disease: Secondary | ICD-10-CM | POA: Insufficient documentation

## 2018-08-31 DIAGNOSIS — I129 Hypertensive chronic kidney disease with stage 1 through stage 4 chronic kidney disease, or unspecified chronic kidney disease: Secondary | ICD-10-CM | POA: Diagnosis not present

## 2018-08-31 DIAGNOSIS — Z794 Long term (current) use of insulin: Secondary | ICD-10-CM | POA: Insufficient documentation

## 2018-08-31 DIAGNOSIS — Z87891 Personal history of nicotine dependence: Secondary | ICD-10-CM | POA: Insufficient documentation

## 2018-08-31 DIAGNOSIS — N183 Chronic kidney disease, stage 3 (moderate): Secondary | ICD-10-CM | POA: Diagnosis not present

## 2018-09-07 DIAGNOSIS — T8189XA Other complications of procedures, not elsewhere classified, initial encounter: Secondary | ICD-10-CM | POA: Diagnosis not present

## 2018-09-28 ENCOUNTER — Encounter (HOSPITAL_BASED_OUTPATIENT_CLINIC_OR_DEPARTMENT_OTHER): Payer: Medicare Other | Attending: Physician Assistant

## 2018-09-28 DIAGNOSIS — E1140 Type 2 diabetes mellitus with diabetic neuropathy, unspecified: Secondary | ICD-10-CM | POA: Diagnosis not present

## 2018-09-28 DIAGNOSIS — Z87891 Personal history of nicotine dependence: Secondary | ICD-10-CM | POA: Diagnosis not present

## 2018-09-28 DIAGNOSIS — E1122 Type 2 diabetes mellitus with diabetic chronic kidney disease: Secondary | ICD-10-CM | POA: Diagnosis not present

## 2018-09-28 DIAGNOSIS — E1136 Type 2 diabetes mellitus with diabetic cataract: Secondary | ICD-10-CM | POA: Insufficient documentation

## 2018-09-28 DIAGNOSIS — I129 Hypertensive chronic kidney disease with stage 1 through stage 4 chronic kidney disease, or unspecified chronic kidney disease: Secondary | ICD-10-CM | POA: Diagnosis not present

## 2018-09-28 DIAGNOSIS — T8189XA Other complications of procedures, not elsewhere classified, initial encounter: Secondary | ICD-10-CM | POA: Diagnosis present

## 2018-09-28 DIAGNOSIS — Y838 Other surgical procedures as the cause of abnormal reaction of the patient, or of later complication, without mention of misadventure at the time of the procedure: Secondary | ICD-10-CM | POA: Diagnosis not present

## 2018-09-28 DIAGNOSIS — N183 Chronic kidney disease, stage 3 (moderate): Secondary | ICD-10-CM | POA: Insufficient documentation

## 2018-09-28 DIAGNOSIS — Z794 Long term (current) use of insulin: Secondary | ICD-10-CM | POA: Diagnosis not present

## 2018-10-21 ENCOUNTER — Ambulatory Visit: Payer: Medicare Other | Admitting: Podiatry

## 2018-11-02 ENCOUNTER — Encounter (HOSPITAL_BASED_OUTPATIENT_CLINIC_OR_DEPARTMENT_OTHER): Payer: Medicare Other | Attending: Internal Medicine

## 2018-11-02 DIAGNOSIS — Z794 Long term (current) use of insulin: Secondary | ICD-10-CM | POA: Insufficient documentation

## 2018-11-02 DIAGNOSIS — T8189XA Other complications of procedures, not elsewhere classified, initial encounter: Secondary | ICD-10-CM | POA: Diagnosis present

## 2018-11-02 DIAGNOSIS — E1140 Type 2 diabetes mellitus with diabetic neuropathy, unspecified: Secondary | ICD-10-CM | POA: Insufficient documentation

## 2018-11-02 DIAGNOSIS — Y838 Other surgical procedures as the cause of abnormal reaction of the patient, or of later complication, without mention of misadventure at the time of the procedure: Secondary | ICD-10-CM | POA: Insufficient documentation

## 2018-11-02 DIAGNOSIS — N183 Chronic kidney disease, stage 3 (moderate): Secondary | ICD-10-CM | POA: Diagnosis not present

## 2018-11-02 DIAGNOSIS — Z6841 Body Mass Index (BMI) 40.0 and over, adult: Secondary | ICD-10-CM | POA: Insufficient documentation

## 2018-11-02 DIAGNOSIS — E1122 Type 2 diabetes mellitus with diabetic chronic kidney disease: Secondary | ICD-10-CM | POA: Diagnosis not present

## 2018-11-02 DIAGNOSIS — Z87891 Personal history of nicotine dependence: Secondary | ICD-10-CM | POA: Diagnosis not present

## 2018-11-02 DIAGNOSIS — I129 Hypertensive chronic kidney disease with stage 1 through stage 4 chronic kidney disease, or unspecified chronic kidney disease: Secondary | ICD-10-CM | POA: Insufficient documentation

## 2018-11-14 ENCOUNTER — Other Ambulatory Visit: Payer: Self-pay | Admitting: Urology

## 2018-11-22 ENCOUNTER — Encounter (HOSPITAL_BASED_OUTPATIENT_CLINIC_OR_DEPARTMENT_OTHER): Payer: Self-pay | Admitting: *Deleted

## 2018-11-22 ENCOUNTER — Other Ambulatory Visit: Payer: Self-pay

## 2018-11-22 NOTE — Progress Notes (Addendum)
SPOKE WITH Valerie [name] NPO AFTER MIDNIGHT, ARRIVE 530 AM 11-29-18 [facility] WISHES TO TAKE ONLY OXYMORPHONE AND OXYCONTIN SIP OF WATER AM OF SURGERY DRIVER SPOUSE [name] WILL STAY RECORDS ON CHART/EPIC EKG 08-11-18 Epic NEEDS I STAT 8 HAS SURGERY ORDERS IN EPIC

## 2018-11-28 NOTE — H&P (Signed)
CC/HPI: Valerie [name] has quite an extensive medical history with chronic comorbidities. 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 This is managed with suppressive antibiotics as well as ureteral stent which gets exchanged every three months. Her last stent exchange was performed in late August. She remains on Myrbetriq 50mg  for her OAB symptoms.   She was taken off of BP meds in 7/19 for hypotension. She remains on Celebrex. She has CDK3.     ALLERGIES: No Allergies    MEDICATIONS: Alprazolam 0.25 mg tablet Oral  Celecoxib  Escitalopram Oxalate 10 mg tablet  Fish Oil CAPS Oral  Flonase 50 mcg/actuation spray, suspension Nasal  Furosemide 20 mg tablet Oral  Humulin R 100 unit/ml vial Injection  Lipitor 10 mg tablet Oral  Mucinex 600 mg tablet, extended release 12 hr Oral  Myrbetriq 50 mg tablet, extended release 24 hr Oral  Ondansetron Odt 4 mg tablet,disintegrating Oral  Oxycontin  Pantoprazole Sodium 40 mg tablet, delayed release Oral  Tizanidine Hcl 2 mg tablet Oral      Notes: She has increased the oxymorphone for her back.   GU PSH: Cysto Uretero Biopsy Fulgura - 2017 Cystoscopy Insert Stent, Right - 08/11/2018, Right - 03/22/2018, Right - 10/28/2017, Right - 07/01/2017, Right - 2018, 2017, 2017 Cystoscopy Ureteroscopy, Right - 2018, 2017 Locm 300-399Mg /Ml Iodine,1Ml - 2018 Rpr Umbil Hern; Reduc < 5 Yr - 2017      PSH Notes: Epidural injections 2017   NON-GU PSH: Laparoscopy, Surgical; Repair Umbilical Hernia Pancreas Surgery (Unspecified) - 2017 Small bowel resection - 2017        GU PMH: Hydronephrosis Unspec (Worsening), She is overdue for a stent exchange and has some increased pain. I will get a urine culture and get her set up for the stent exchange next week. - 03/16/2018, She is due for a stent change in the next 4-6 weeks. I will get that set up. , - 09/15/2017, She is doing well post stent  exchanged. I will have her return in 3 months to arrange the next exchange. , - 07/14/2017 (Stable), Right, - 2017 (Improving), There is minimal residual right hydro noted on [place]. , - 2017, Hydronephrosis, right, - 2017 Urinary Urgency, Her frequency and urgency are well controlled on Myrbetriq. - 07/14/2017 Gross hematuria, She has gross hematuria that appears to be originating in the right kidney and she has chronic inflammation and obstruction of that kidney, I am going to get a cytology today and will get her set up for ureteroscopy and stenting to better evaluate the collecting system. With CKD3 I think it would be best if she is able to avoid right nephrectomy. I have reviewed the risks of the procedure in detail including bleeding, infection, ureteral injury, need for a stent and secondary procedures, thrombotic events and anesthetic complications. - 2018, (Acute), Culture urine. Will continue with daily suppression Cephalexin until culture finalized. If she has UTI now resistant to Cephalexin will change suppression. Will proceed with hematuria workup with CT and possible cysto , - 2018, Gross hematuria, - 2017 Proteinuria (Acute), With new onset Proteinuria in pt with diabetes and hypertension may need nephrology referral. Will check BUN/creatine today - 2018 Chronic cystitis (w/o hematuria) (Stable), Her UA looks infected but she just came of our antibiotic and is on amoxicillin. She is free of symptoms. - 2017, Chronic cystitis, - 2017 Urinary Tract Inf, Unspec site, Pyuria - 2017 Incomplete bladder emptying, Feeling of incomplete  bladder emptying - 2017 Postmenopausal atrophic vaginitis, Atrophic vaginitis - 2017    NON-GU PMH: Anxiety, Anxiety - 2017 Encounter for general adult medical examination without abnormal findings, Encounter for preventive health examination - 2017 Personal history of other diseases of the circulatory system, History of hypertension - 2017 Personal history of other  diseases of the digestive system, History of small bowel obstruction - 2017, History of pancreatitis, - 2017, History of esophageal reflux, - 2017 Personal history of other diseases of the musculoskeletal system and connective tissue, History of arthritis - 2017 Personal history of other endocrine, nutritional and metabolic disease, History of diabetes mellitus - 2017, History of hypercholesterolemia, - 2017    FAMILY HISTORY: Death of family member - Runs In Family Prostate Cancer - Runs In Family   SOCIAL HISTORY: Marital Status: Married Preferred Language: English; Ethnicity: Not Hispanic Or Latino; Race: White Current Smoking Status: Patient does not smoke anymore.  Does not use smokeless tobacco. Has never drank.  Does not use drugs. Drinks 1 caffeinated drink per day.    REVIEW OF SYSTEMS:     GU Review Female:  Patient denies frequent urination, hard to postpone urination, burning /pain with urination, get up at night to urinate, leakage of urine, stream starts and stops, trouble starting your stream, have to strain to urinate, and being pregnant.    Gastrointestinal (Upper):  Patient denies nausea, vomiting, and indigestion/ heartburn.    Gastrointestinal (Lower):  Patient denies diarrhea and constipation.    Constitutional:  Patient denies fever, night sweats, weight loss, and fatigue.    Skin:  Patient denies skin rash/ lesion and itching.    Eyes:  Patient denies blurred vision and double vision.    Ears/ Nose/ Throat:  Patient denies sore throat and sinus problems.    Hematologic/Lymphatic:  Patient denies swollen glands and easy bruising.    Cardiovascular:  Patient denies chest pains and leg swelling.    Respiratory:  Patient denies cough and shortness of breath.    Endocrine:  Patient denies excessive thirst.    Musculoskeletal:  Patient reports back pain. Patient denies joint pain.    Neurological:  Patient denies headaches and dizziness.    Psychologic:  Patient denies  depression and anxiety.    VITAL SIGNS:       11/10/2018 03:31 PM     Weight 233 lb / 105.69 kg     BP 111/71 mmHg     Pulse 92 /min     Temperature 98.8 F / 37.1 C     MULTI-SYSTEM PHYSICAL EXAMINATION:      Constitutional: Obese. No physical deformities. Normally developed. Good grooming.      Respiratory: Normal breath sounds. No labored breathing, no use of accessory muscles.      Cardiovascular: Regular rate and rhythm. No murmur, no gallop.             PAST DATA REVIEWED:   Source Of History:  Patient  Lab Test Review:  BMP  Records Review:  Previous Hospital Records  Urine Test Review:  Urinalysis   Notes:  Recent Cr in 8/19 was 1.4.    PROCEDURES:    Urinalysis w/Scope  Dipstick Dipstick Cont'd Micro  Color: Yellow Bilirubin: Neg mg/dL WBC/hpf: >86/VHQ>60/hpf  Appearance: Cloudy Ketones: Neg mg/dL RBC/hpf: >46/NGE>60/hpf  Specific Gravity: 1.025 Blood: 3+ ery/uL Bacteria: Rare (0-9/hpf)  pH: 5.5 Protein: 2+ mg/dL Cystals: NS (Not Seen)  Glucose: Neg mg/dL Urobilinogen: 0.2 mg/dL Casts: NS (Not Seen)   Nitrites:  Neg Trichomonas: Not Present   Leukocyte Esterase: 3+ leu/uL Mucous: Not Present    Epithelial Cells: 0 - 5/hpf    Yeast: NS (Not Seen)    Sperm: Not Present    ASSESSMENT:     ICD-10 Details  1 GU:  Chronic cystitis (w/o hematuria) - N30.20 Stable - I will culture the urine today.   2  Hydronephrosis Unspec - N13.30 She is due for a right stent exchange and that will be scheduled.   3  Chronic kidney disease stage 3 (GFR 30-60) - N18.3 Stable - She had a lot of questions about her kidney dysfunction. She has CKD3. I have recommended she not take Celebrex but also suggested she bring this up with Dr. Manus Gunning.    PLAN:   Medications  New Meds: Keflex 500 mg capsule 1 capsule PO Daily #90 3 Refill(s)    Orders  Labs Urine Culture  Schedule  Return Visit/Planned Activity: Next Available Appointment - Schedule Surgery  Note: She needs cystoscopy with right stent  exchange in the next 2-3wks.   Document  Letter(s):  Created for Patient: Clinical Summary

## 2018-11-29 ENCOUNTER — Encounter (HOSPITAL_BASED_OUTPATIENT_CLINIC_OR_DEPARTMENT_OTHER)
Admission: RE | Disposition: A | Discharge status: Home / Self Care | Payer: Self-pay | Source: Ambulatory Visit | Attending: Urology

## 2018-11-29 ENCOUNTER — Ambulatory Visit (HOSPITAL_BASED_OUTPATIENT_CLINIC_OR_DEPARTMENT_OTHER)
Admission: RE | Admit: 2018-11-29 | Discharge: 2018-11-29 | Disposition: A | Discharge status: Home / Self Care | Payer: Medicare Other | Source: Ambulatory Visit | Attending: Urology | Admitting: Urology

## 2018-11-29 ENCOUNTER — Ambulatory Visit (HOSPITAL_BASED_OUTPATIENT_CLINIC_OR_DEPARTMENT_OTHER): Payer: Medicare Other | Admitting: Anesthesiology

## 2018-11-29 ENCOUNTER — Encounter (HOSPITAL_BASED_OUTPATIENT_CLINIC_OR_DEPARTMENT_OTHER): Payer: Self-pay | Admitting: *Deleted

## 2018-11-29 DIAGNOSIS — E1140 Type 2 diabetes mellitus with diabetic neuropathy, unspecified: Secondary | ICD-10-CM | POA: Diagnosis not present

## 2018-11-29 DIAGNOSIS — Z87891 Personal history of nicotine dependence: Secondary | ICD-10-CM | POA: Diagnosis not present

## 2018-11-29 DIAGNOSIS — Z6841 Body Mass Index (BMI) 40.0 and over, adult: Secondary | ICD-10-CM | POA: Insufficient documentation

## 2018-11-29 DIAGNOSIS — F419 Anxiety disorder, unspecified: Secondary | ICD-10-CM | POA: Diagnosis not present

## 2018-11-29 DIAGNOSIS — E785 Hyperlipidemia, unspecified: Secondary | ICD-10-CM | POA: Diagnosis not present

## 2018-11-29 DIAGNOSIS — N183 Chronic kidney disease, stage 3 (moderate): Secondary | ICD-10-CM | POA: Diagnosis not present

## 2018-11-29 DIAGNOSIS — I129 Hypertensive chronic kidney disease with stage 1 through stage 4 chronic kidney disease, or unspecified chronic kidney disease: Secondary | ICD-10-CM | POA: Diagnosis not present

## 2018-11-29 DIAGNOSIS — E1122 Type 2 diabetes mellitus with diabetic chronic kidney disease: Secondary | ICD-10-CM | POA: Diagnosis not present

## 2018-11-29 DIAGNOSIS — N3020 Other chronic cystitis without hematuria: Secondary | ICD-10-CM | POA: Insufficient documentation

## 2018-11-29 DIAGNOSIS — I452 Bifascicular block: Secondary | ICD-10-CM | POA: Diagnosis not present

## 2018-11-29 DIAGNOSIS — N131 Hydronephrosis with ureteral stricture, not elsewhere classified: Secondary | ICD-10-CM | POA: Insufficient documentation

## 2018-11-29 HISTORY — PX: CYSTOSCOPY W/ URETERAL STENT PLACEMENT: SHX1429

## 2018-11-29 LAB — POCT I-STAT, CHEM 8
BUN: 45 mg/dL — AB (ref 8–23)
CHLORIDE: 103 mmol/L (ref 98–111)
Calcium, Ion: 1.19 mmol/L (ref 1.15–1.40)
Creatinine, Ser: 1.7 mg/dL — ABNORMAL HIGH (ref 0.44–1.00)
Glucose, Bld: 92 mg/dL (ref 70–99)
HCT: 37 % (ref 36.0–46.0)
Hemoglobin: 12.6 g/dL (ref 12.0–15.0)
POTASSIUM: 4.5 mmol/L (ref 3.5–5.1)
SODIUM: 144 mmol/L (ref 135–145)
TCO2: 29 mmol/L (ref 22–32)

## 2018-11-29 LAB — GLUCOSE, CAPILLARY: Glucose-Capillary: 123 mg/dL — ABNORMAL HIGH (ref 70–99)

## 2018-11-29 SURGERY — CYSTOSCOPY, FLEXIBLE, WITH STENT REPLACEMENT
Anesthesia: Monitor Anesthesia Care | Site: Ureter | Laterality: Right

## 2018-11-29 MED ORDER — MORPHINE SULFATE (PF) 2 MG/ML IV SOLN
2.0000 mg | INTRAVENOUS | Status: DC | PRN
  Filled 2018-11-29: qty 1

## 2018-11-29 MED ORDER — FENTANYL CITRATE (PF) 100 MCG/2ML IJ SOLN
INTRAMUSCULAR | Status: DC | PRN
  Administered 2018-11-29 (×2): 50 ug via INTRAVENOUS

## 2018-11-29 MED ORDER — LACTATED RINGERS IV SOLN
INTRAVENOUS | Status: DC
  Administered 2018-11-29: 07:00:00 via INTRAVENOUS
  Filled 2018-11-29: qty 1000

## 2018-11-29 MED ORDER — ACETAMINOPHEN 325 MG PO TABS
650.0000 mg | ORAL_TABLET | ORAL | Status: DC | PRN
  Filled 2018-11-29: qty 2

## 2018-11-29 MED ORDER — OXYCODONE HCL 5 MG PO TABS
5.0000 mg | ORAL_TABLET | ORAL | Status: DC | PRN
  Filled 2018-11-29: qty 2

## 2018-11-29 MED ORDER — SODIUM CHLORIDE 0.9% FLUSH
3.0000 mL | INTRAVENOUS | Status: DC | PRN
  Filled 2018-11-29: qty 3

## 2018-11-29 MED ORDER — OXYMORPHONE HCL ER 10 MG PO T12A
10.0000 mg | EXTENDED_RELEASE_TABLET | Freq: Once | ORAL | Status: AC
  Administered 2018-11-29: 10 mg via ORAL

## 2018-11-29 MED ORDER — PROPOFOL 10 MG/ML IV BOLUS
INTRAVENOUS | Status: AC
  Filled 2018-11-29: qty 40

## 2018-11-29 MED ORDER — CIPROFLOXACIN IN D5W 400 MG/200ML IV SOLN
INTRAVENOUS | Status: AC
  Filled 2018-11-29: qty 200

## 2018-11-29 MED ORDER — CIPROFLOXACIN IN D5W 400 MG/200ML IV SOLN
400.0000 mg | INTRAVENOUS | Status: AC
  Administered 2018-11-29: 400 mg via INTRAVENOUS
  Filled 2018-11-29: qty 200

## 2018-11-29 MED ORDER — LIDOCAINE 2% (20 MG/ML) 5 ML SYRINGE
INTRAMUSCULAR | Status: DC | PRN
  Administered 2018-11-29: 25 mg via INTRAVENOUS

## 2018-11-29 MED ORDER — LIDOCAINE 2% (20 MG/ML) 5 ML SYRINGE
INTRAMUSCULAR | Status: AC
  Filled 2018-11-29: qty 5

## 2018-11-29 MED ORDER — FENTANYL CITRATE (PF) 100 MCG/2ML IJ SOLN
INTRAMUSCULAR | Status: AC
  Filled 2018-11-29: qty 2

## 2018-11-29 MED ORDER — SODIUM CHLORIDE 0.9 % IV SOLN
250.0000 mL | INTRAVENOUS | Status: DC | PRN
  Filled 2018-11-29: qty 250

## 2018-11-29 MED ORDER — ONDANSETRON HCL 4 MG/2ML IJ SOLN
INTRAMUSCULAR | Status: AC
  Filled 2018-11-29: qty 2

## 2018-11-29 MED ORDER — EPHEDRINE SULFATE 50 MG/ML IJ SOLN
INTRAMUSCULAR | Status: DC | PRN
  Administered 2018-11-29: 10 mg via INTRAVENOUS
  Administered 2018-11-29: 5 mg via INTRAVENOUS

## 2018-11-29 MED ORDER — SODIUM CHLORIDE 0.9% FLUSH
3.0000 mL | Freq: Two times a day (BID) | INTRAVENOUS | Status: DC
  Filled 2018-11-29: qty 3

## 2018-11-29 MED ORDER — ACETAMINOPHEN 650 MG RE SUPP
650.0000 mg | RECTAL | Status: DC | PRN
  Filled 2018-11-29: qty 1

## 2018-11-29 MED ORDER — STERILE WATER FOR IRRIGATION IR SOLN
Status: DC | PRN
  Administered 2018-11-29: 3000 mL via INTRAVESICAL

## 2018-11-29 MED ORDER — ONDANSETRON HCL 4 MG/2ML IJ SOLN
INTRAMUSCULAR | Status: DC | PRN
  Administered 2018-11-29: 4 mg via INTRAVENOUS

## 2018-11-29 MED ORDER — PROPOFOL 500 MG/50ML IV EMUL
INTRAVENOUS | Status: DC | PRN
  Administered 2018-11-29: 200 ug/kg/min via INTRAVENOUS

## 2018-11-29 MED ORDER — ONDANSETRON HCL 4 MG/2ML IJ SOLN
4.0000 mg | Freq: Once | INTRAMUSCULAR | Status: DC | PRN
  Filled 2018-11-29: qty 2

## 2018-11-29 MED ORDER — FENTANYL CITRATE (PF) 100 MCG/2ML IJ SOLN
25.0000 ug | INTRAMUSCULAR | Status: DC | PRN
  Filled 2018-11-29: qty 1

## 2018-11-29 SURGICAL SUPPLY — 27 items
BAG DRAIN URO-CYSTO SKYTR STRL (DRAIN) ×2 IMPLANT
BASKET STONE 1.7 NGAGE (UROLOGICAL SUPPLIES) IMPLANT
BASKET ZERO TIP NITINOL 2.4FR (BASKET) IMPLANT
CATH URET 5FR 28IN CONE TIP (BALLOONS)
CATH URET 5FR 28IN OPEN ENDED (CATHETERS) ×2 IMPLANT
CATH URET 5FR 70CM CONE TIP (BALLOONS) IMPLANT
CLOTH BEACON ORANGE TIMEOUT ST (SAFETY) ×2 IMPLANT
ELECT REM PT RETURN 9FT ADLT (ELECTROSURGICAL)
ELECTRODE REM PT RTRN 9FT ADLT (ELECTROSURGICAL) IMPLANT
FIBER LASER FLEXIVA 365 (UROLOGICAL SUPPLIES) IMPLANT
FIBER LASER TRAC TIP (UROLOGICAL SUPPLIES) IMPLANT
GLOVE BIO SURGEON STRL SZ8 (GLOVE) ×2 IMPLANT
GLOVE BIOGEL PI IND STRL 8.5 (GLOVE) ×2 IMPLANT
GLOVE BIOGEL PI INDICATOR 8.5 (GLOVE) ×2
GLOVE SURG SS PI 8.0 STRL IVOR (GLOVE) ×2 IMPLANT
GOWN STRL REUS W/TWL XL LVL3 (GOWN DISPOSABLE) ×4 IMPLANT
GUIDEWIRE ANG ZIPWIRE 038X150 (WIRE) IMPLANT
GUIDEWIRE STR DUAL SENSOR (WIRE) ×2 IMPLANT
INFUSOR MANOMETER BAG 3000ML (MISCELLANEOUS) IMPLANT
IV NS IRRIG 3000ML ARTHROMATIC (IV SOLUTION) IMPLANT
KIT TURNOVER CYSTO (KITS) ×2 IMPLANT
MANIFOLD NEPTUNE II (INSTRUMENTS) ×2 IMPLANT
NS IRRIG 500ML POUR BTL (IV SOLUTION) IMPLANT
PACK CYSTO (CUSTOM PROCEDURE TRAY) ×2 IMPLANT
STENT URET 6FRX24 CONTOUR (STENTS) ×2 IMPLANT
TUBE CONNECTING 12X1/4 (SUCTIONS) ×2 IMPLANT
TUBING UROLOGY SET (TUBING) ×2 IMPLANT

## 2018-11-29 NOTE — Op Note (Signed)
Procedure: Cystoscopy with exchange of right double-J stent.  Preop diagnosis: Right ureteral obstruction.  Postop diagnosis: Same.  Surgeon: Dr. [name].  Anesthesia: MAC.  Drain: 6 [place] by 24 cm contour double-J stent.  Specimen: None.  EBL: None.  Complications: None.  Indications: Valerie [name] is a 72 year old white female with chronic right ureteral obstruction who was managed with ureteral stents.  She is to undergo exchange today.  Procedure: She was taken operating room where she was given Cipro.  She was placed in lithotomy position and given sedation as needed.  She was fitted with PAS hose.  Her perineum and genitalia were prepped with Betadine solution and she was draped in usual sterile fashion.  Cystoscopy was performed using a 23 [place] scope and 30 degree lens.  Examination revealed a normal urethra.  The bladder wall was smooth and pale with the exception of some edema of the right ureteral orifice where the stent was located.  The right ureteral stent was grasped with a grasping forceps and pulled the urethral meatus.  A sensor guidewire was passed to the kidney under fluoroscopic guidance.  The old stent was removed over the wire.  The cystoscope was reinserted over the wire and a fresh 6 [place] by 24 cm contour double-J stent was advanced to the right kidney under fluoroscopic guidance.  The wire was removed and a good coil in the kidney and a good coil in the bladder.  Her bladder was drained and the cystoscope was removed.  she was taken down from the lithotomy position and she was moved to recovery room in stable condition.  There were no complications.

## 2018-11-29 NOTE — Anesthesia Preprocedure Evaluation (Addendum)
Anesthesia Evaluation  Patient identified by MRN, date of birth, ID band Patient awake    Reviewed: Allergy & Precautions, NPO status , Patient's Chart, lab work & pertinent test results  Airway Mallampati: II  TM Distance: >3 FB Neck ROM: Full    Dental  (+) Edentulous Upper   Pulmonary former smoker,    Pulmonary exam normal breath sounds clear to auscultation       Cardiovascular hypertension, Normal cardiovascular exam Rhythm:Regular Rate:Normal  ECG: rate 88. Normal sinus rhythm Right bundle branch block Left anterior fascicular block Bifascicular block    Neuro/Psych PSYCHIATRIC DISORDERS Anxiety Diabetic neuropathy  Neuromuscular disease    GI/Hepatic GERD  Medicated and Controlled,(+)     substance abuse  ,   Endo/Other  diabetes, Insulin DependentMorbid obesity  Renal/GU Renal disease     Musculoskeletal negative musculoskeletal ROS (+) narcotic dependentChronic low back pain   Abdominal   Peds  Hematology HLD   Anesthesia Other Findings RIGHT HYDRONEPHROSIS  Reproductive/Obstetrics                            Anesthesia Physical Anesthesia Plan  ASA: III  Anesthesia Plan: MAC   Post-op Pain Management:    Induction: Intravenous  PONV Risk Score and Plan: 2 and Propofol infusion and Treatment may vary due to age or medical condition  Airway Management Planned: Simple Face Mask  Additional Equipment:   Intra-op Plan:   Post-operative Plan:   Informed Consent: I have reviewed the patients History and Physical, chart, labs and discussed the procedure including the risks, benefits and alternatives for the proposed anesthesia with the patient or authorized representative who has indicated his/her understanding and acceptance.   Dental advisory given  Plan Discussed with: CRNA  Anesthesia Plan Comments:         Anesthesia Quick Evaluation

## 2018-11-29 NOTE — Anesthesia Postprocedure Evaluation (Signed)
Anesthesia Post Note  Patient: Valerie [name]  Procedure(s) Performed: CYSTOSCOPY WITH STENT REPLACEMENT (Right Ureter)     Patient location during evaluation: PACU Anesthesia Type: MAC Level of consciousness: awake Pain management: pain level controlled Vital Signs Assessment: post-procedure vital signs reviewed and stable Respiratory status: spontaneous breathing, nonlabored ventilation, respiratory function stable and patient connected to nasal cannula oxygen Cardiovascular status: stable and blood pressure returned to baseline Postop Assessment: no apparent nausea or vomiting Anesthetic complications: no    Last Vitals:  Vitals:   11/29/18 0845 11/29/18 0945  BP: 119/62 (!) 107/59  Pulse: 70 70  Resp: 14 14  Temp:  36.6 C  SpO2: 94% 91%    Last Pain:  Vitals:   11/29/18 0945  TempSrc:   PainSc: 0-No pain                 Amadi Frady P Nylan Nevel

## 2018-11-29 NOTE — Anesthesia Procedure Notes (Signed)
Procedure Name: MAC Date/Time: 11/29/2018 7:30 AM Performed by: Wanita Chamberlain, CRNA Pre-anesthesia Checklist: Patient identified, Timeout performed, Emergency Drugs available, Suction available and Patient being monitored Patient Re-evaluated:Patient Re-evaluated prior to induction Oxygen Delivery Method: Nasal cannula Induction Type: IV induction Placement Confirmation: positive ETCO2,  CO2 detector and breath sounds checked- equal and bilateral

## 2018-11-29 NOTE — Interval H&P Note (Signed)
History and Physical Interval Note:  11/29/2018 6:51 AM  Valerie JakschBeverly H Viviano  has presented today for surgery, with the diagnosis of RIGHT HYDRONEPHROSIS  The various methods of treatment have been discussed with the patient and family. After consideration of risks, benefits and other options for treatment, the patient has consented to  Procedure(s): CYSTOSCOPY WITH STENT REPLACEMENT (Right) as a surgical intervention .  The patient's history has been reviewed, patient examined, no change in status, stable for surgery.  I have reviewed the patient's chart and labs.  Questions were answered to the patient's satisfaction.     Bjorn PippinJohn Milessa Hogan

## 2018-11-29 NOTE — Discharge Instructions (Signed)
Ureteral Stent Implantation, Care After °Refer to this sheet in the next few weeks. These instructions provide you with information about caring for yourself after your procedure. Your health care provider may also give you more specific instructions. Your treatment has been planned according to current medical practices, but problems sometimes occur. Call your health care provider if you have any problems or questions after your procedure. °What can I expect after the procedure? °After the procedure, it is common to have: °· Nausea. °· Mild pain when you urinate. You may feel this pain in your lower back or lower abdomen. Pain should stop within a few minutes after you urinate. This may last for up to 1 week. °· A small amount of blood in your urine for several days. ° °Follow these instructions at home: ° °Medicines °· Take over-the-counter and prescription medicines only as told by your health care provider. °· If you were prescribed an antibiotic medicine, take it as told by your health care provider. Do not stop taking the antibiotic even if you start to feel better. °· Do not drive for 24 hours if you received a sedative. °· Do not drive or operate heavy machinery while taking prescription pain medicines. °Activity °· Return to your normal activities as told by your health care provider. Ask your health care provider what activities are safe for you. °· Do not lift anything that is heavier than 10 lb (4.5 kg). Follow this limit for 1 week after your procedure, or for as long as told by your health care provider. °General instructions °· Watch for any blood in your urine. Call your health care provider if the amount of blood in your urine increases. °· If you have a catheter: °? Follow instructions from your health care provider about taking care of your catheter and collection bag. °? Do not take baths, swim, or use a hot tub until your health care provider approves. °· Drink enough fluid to keep your urine  clear or pale yellow. °· Keep all follow-up visits as told by your health care provider. This is important. °Contact a health care provider if: °· You have pain that gets worse or does not get better with medicine, especially pain when you urinate. °· You have difficulty urinating. °· You feel nauseous or you vomit repeatedly during a period of more than 2 days after the procedure. °Get help right away if: °· Your urine is dark red or has blood clots in it. °· You are leaking urine (have incontinence). °· The end of the stent comes out of your urethra. °· You cannot urinate. °· You have sudden, sharp, or severe pain in your abdomen or lower back. °· You have a fever. °This information is not intended to replace advice given to you by your health care provider. Make sure you discuss any questions you have with your health care provider. °Document Released: 08/16/2013 Document Revised: 05/21/2016 Document Reviewed: 06/28/2015 °Elsevier Interactive Patient Education © 2018 [employer] ° ° °Post Anesthesia Home Care Instructions ° °Activity: °Get plenty of rest for the remainder of the day. A responsible individual must stay with you for 24 hours following the procedure.  °For the next 24 hours, DO NOT: °-Drive a car °-Operate machinery °-Drink alcoholic beverages °-Take any medication unless instructed by your physician °-Make any legal decisions or sign important papers. ° °Meals: °Start with liquid foods such as gelatin or soup. Progress to regular foods as tolerated. Avoid greasy, spicy, heavy foods. If nausea   and/or vomiting occur, drink only clear liquids until the nausea and/or vomiting subsides. Call your physician if vomiting continues. ° °Special Instructions/Symptoms: °Your throat may feel dry or sore from the anesthesia or the breathing tube placed in your throat during surgery. If this causes discomfort, gargle with warm salt water. The discomfort should disappear within 24 hours. ° °  ° ° °

## 2018-11-29 NOTE — Transfer of Care (Signed)
Immediate Anesthesia Transfer of Care Note  Patient: Valerie [name]  Procedure(s) Performed: CYSTOSCOPY WITH STENT REPLACEMENT (Right Ureter)  Patient Location: PACU  Anesthesia Type:MAC  Level of Consciousness: sedated, drowsy and patient cooperative  Airway & Oxygen Therapy: Patient Spontanous Breathing and Patient connected to nasal cannula oxygen  Post-op Assessment: Report given to RN and Post -op Vital signs reviewed and stable  Post vital signs: Reviewed and stable  Last Vitals:  Vitals Value Taken Time  BP 106/80 11/29/2018  7:58 AM  Temp    Pulse 65 11/29/2018  7:59 AM  Resp 10 11/29/2018  7:59 AM  SpO2 97 % 11/29/2018  7:59 AM    Last Pain:  Vitals:   11/29/18 0633  TempSrc: Oral         Complications: No apparent anesthesia complications

## 2018-11-30 ENCOUNTER — Encounter (HOSPITAL_BASED_OUTPATIENT_CLINIC_OR_DEPARTMENT_OTHER): Payer: Self-pay | Admitting: Urology

## 2018-11-30 ENCOUNTER — Encounter (HOSPITAL_BASED_OUTPATIENT_CLINIC_OR_DEPARTMENT_OTHER): Payer: Medicare Other | Attending: Physician Assistant

## 2018-12-30 ENCOUNTER — Encounter (HOSPITAL_BASED_OUTPATIENT_CLINIC_OR_DEPARTMENT_OTHER): Payer: Medicare Other | Attending: Internal Medicine

## 2018-12-30 DIAGNOSIS — Y838 Other surgical procedures as the cause of abnormal reaction of the patient, or of later complication, without mention of misadventure at the time of the procedure: Secondary | ICD-10-CM | POA: Insufficient documentation

## 2018-12-30 DIAGNOSIS — I10 Essential (primary) hypertension: Secondary | ICD-10-CM | POA: Diagnosis not present

## 2018-12-30 DIAGNOSIS — E1140 Type 2 diabetes mellitus with diabetic neuropathy, unspecified: Secondary | ICD-10-CM | POA: Diagnosis not present

## 2018-12-30 DIAGNOSIS — E1136 Type 2 diabetes mellitus with diabetic cataract: Secondary | ICD-10-CM | POA: Diagnosis not present

## 2018-12-30 DIAGNOSIS — T8189XA Other complications of procedures, not elsewhere classified, initial encounter: Secondary | ICD-10-CM | POA: Insufficient documentation

## 2019-01-10 ENCOUNTER — Ambulatory Visit: Payer: Medicare Other | Admitting: Podiatry

## 2019-01-10 ENCOUNTER — Encounter: Payer: Self-pay | Admitting: Podiatry

## 2019-01-10 DIAGNOSIS — M79676 Pain in unspecified toe(s): Secondary | ICD-10-CM

## 2019-01-10 DIAGNOSIS — E1142 Type 2 diabetes mellitus with diabetic polyneuropathy: Secondary | ICD-10-CM | POA: Diagnosis not present

## 2019-01-10 DIAGNOSIS — B351 Tinea unguium: Secondary | ICD-10-CM

## 2019-01-10 NOTE — Progress Notes (Signed)
Complaint:  Visit Type: Patient returns to my office for continued preventative foot care services. Complaint: Patient states" my nails have grown long and thick and become painful to walk and wear shoes" Patient has been diagnosed with DM with neuropathy.. The patient presents for preventative foot care services. No changes to ROS  Podiatric Exam: Vascular: dorsalis pedis and posterior tibial pulses are palpable bilateral. Capillary return is immediate. Temperature gradient is WNL. Skin turgor WNL  Sensorium: Absent LOPS in toes.  LOPS in foot diminished . Normal tactile sensation bilaterally. Nail Exam: Pt has thick disfigured discolored nails with subungual debris noted bilateral entire nail hallux through fifth toenails Ulcer Exam: There is no evidence of ulcer or pre-ulcerative changes or infection. Orthopedic Exam: Muscle tone and strength are WNL. No limitations in general ROM. No crepitus or effusions noted. Hammer toes  2 . Bony prominences are unremarkable. Skin: No Porokeratosis.  .No infections noted.    Diagnosis:  Onychomycosis, , Pain in right toe, pain in left toes  Treatment & Plan Procedures and Treatment: Consent by patient was obtained for treatment procedures. The patient understood the discussion of treatment and procedures well. All questions were answered thoroughly reviewed. Debridement of mycotic and hypertrophic toenails, 1 through 5 bilateral and clearing of subungual debris. No ulceration, no infection noted. Return Visit-Office Procedure: Patient instructed to return to the office for a follow up visit 3 months for continued evaluation and treatment.    Helane Gunther DPM

## 2019-02-01 ENCOUNTER — Other Ambulatory Visit: Payer: Self-pay | Admitting: Urology

## 2019-02-02 ENCOUNTER — Encounter (HOSPITAL_BASED_OUTPATIENT_CLINIC_OR_DEPARTMENT_OTHER): Payer: Self-pay

## 2019-02-02 ENCOUNTER — Encounter (HOSPITAL_BASED_OUTPATIENT_CLINIC_OR_DEPARTMENT_OTHER): Payer: Medicare Other | Attending: Internal Medicine

## 2019-02-07 ENCOUNTER — Encounter (HOSPITAL_BASED_OUTPATIENT_CLINIC_OR_DEPARTMENT_OTHER): Payer: Self-pay | Admitting: *Deleted

## 2019-02-07 ENCOUNTER — Other Ambulatory Visit: Payer: Self-pay

## 2019-02-07 NOTE — Progress Notes (Signed)
Spoke w/ pt via phone for pre-op interview.  Npo after mn.  Arrive at [facility].  Needs hg, bmet.  Current ekg in chart and epic.  Will take protonix and oxycontin am dos w/ sips of water.

## 2019-02-08 NOTE — H&P (Signed)
CC/HPI: Valerie [name] has quite an extensive medical history with chronic comorbidities. 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 This is managed with suppressive keflex as well as ureteral stent which gets exchanged every three months. Her last stent exchange was performed on 11/29/18. She remains on Myrbetriq 50mg  for her OAB symptoms.    She is having pain in the RLQ after she voids. Iniitial it was transient but is now more constant. She has no dysuria. It is a crampy, tickling pain. She had a small blood clot this AM.     ALLERGIES: None   MEDICATIONS: Keflex 500 mg capsule 1 capsule PO Daily  Alprazolam 0.25 mg tablet Oral  Amoxicillin  Celecoxib  Escitalopram Oxalate 10 mg tablet  Fish Oil CAPS Oral  Flonase 50 mcg/actuation spray, suspension Nasal  Furosemide 20 mg tablet Oral  Humulin R 100 unit/ml vial Injection  Lipitor 10 mg tablet Oral  Mucinex 600 mg tablet, extended release 12 hr Oral  Myrbetriq 50 mg tablet, extended release 24 hr Oral  Ondansetron Odt 4 mg tablet,disintegrating Oral  Oxycontin  Pantoprazole Sodium 40 mg tablet, delayed release Oral  Tizanidine Hcl 2 mg tablet Oral     Notes: She has increased the oxymorphone for her back.   GU PSH: Cysto Uretero Biopsy Fulgura - 2017 Cystoscopy Insert Stent, Right - 11/29/2018, Right - 08/11/2018, Right - 03/22/2018, Right - 10/28/2017, Right - 07/01/2017, Right - 04/15/2017, 2017, 2017 Cystoscopy Ureteroscopy, Right - 04/15/2017, 2017 Locm 300-399Mg /Ml Iodine,1Ml - 04/02/2017 Rpr Umbil Hern; Reduc < 5 Yr - 2017      PSH Notes: Epidural injections 2017   NON-GU PSH: Laparoscopy, Surgical; Repair Umbilical Hernia Pancreas Surgery (Unspecified) - 2017 Small bowel resection - 2017    GU PMH: Chronic kidney disease stage 3 (GFR 30-60) (Stable), She had a lot of questions about her kidney dysfunction. She has CKD3. I have recommended she not  take Celebrex but also suggested she bring this up with Dr. Manus Gunning. - 11/10/2018 Hydronephrosis Unspec (Worsening), She is overdue for a stent exchange and has some increased pain. I will get a urine culture and get her set up for the stent exchange next week. - 03/16/2018, She is due for a stent change in the next 4-6 weeks. I will get that set up. , - 09/15/2017, She is doing well post stent exchanged. I will have her return in 3 months to arrange the next exchange. , - 07/14/2017 (Stable), Right, - 08/24/2016 (Improving), There is minimal residual right hydro noted on [place]. , - 2017, Hydronephrosis, right, - 2017 Urinary Urgency, Her frequency and urgency are well controlled on Myrbetriq. - 07/14/2017 Gross hematuria, She has gross hematuria that appears to be originating in the right kidney and she has chronic inflammation and obstruction of that kidney, I am going to get a cytology today and will get her set up for ureteroscopy and stenting to better evaluate the collecting system. With CKD3 I think it would be best if she is able to avoid right nephrectomy. I have reviewed the risks of the procedure in detail including bleeding, infection, ureteral injury, need for a stent and secondary procedures, thrombotic events and anesthetic complications. - 04/07/2017, (Acute), Culture urine. Will continue with daily suppression Cephalexin until culture finalized. If she has UTI now resistant to Cephalexin will change suppression. Will proceed with hematuria workup with CT and possible cysto , - 04/01/2017, Gross hematuria, - 2017 Proteinuria (  Acute), With new onset Proteinuria in pt with diabetes and hypertension may need nephrology referral. Will check BUN/creatine today - 04/01/2017 Chronic cystitis (w/o hematuria) (Stable), Her UA looks infected but she just came of our antibiotic and is on amoxicillin. She is free of symptoms. - 2017, Chronic cystitis, - 2017 Urinary Tract Inf, Unspec site, Pyuria - 2017 Incomplete  bladder emptying, Feeling of incomplete bladder emptying - 2017 Postmenopausal atrophic vaginitis, Atrophic vaginitis - 2017    NON-GU PMH: Anxiety, Anxiety - 2017 Encounter for general adult medical examination without abnormal findings, Encounter for preventive health examination - 2017 Personal history of other diseases of the circulatory system, History of hypertension - 2017 Personal history of other diseases of the digestive system, History of small bowel obstruction - 2017, History of pancreatitis, - 2017, History of esophageal reflux, - 2017 Personal history of other diseases of the musculoskeletal system and connective tissue, History of arthritis - 2017 Personal history of other endocrine, nutritional and metabolic disease, History of hypercholesterolemia - 2017, History of diabetes mellitus, - 2017    FAMILY HISTORY: Death of family member - Runs In Family Prostate Cancer - Runs In Family   SOCIAL HISTORY: Marital Status: Married Preferred Language: English; Ethnicity: Not Hispanic Or Latino; Race: White Current Smoking Status: Patient does not smoke anymore.  Does not use smokeless tobacco. Has never drank.  Does not use drugs. Drinks 1 caffeinated drink per day.    REVIEW OF SYSTEMS:    GU Review Female:   Patient denies frequent urination, hard to postpone urination, burning /pain with urination, get up at night to urinate, leakage of urine, stream starts and stops, trouble starting your stream, have to strain to urinate, and being pregnant.  Gastrointestinal (Upper):   Patient denies nausea, vomiting, and indigestion/ heartburn.  Gastrointestinal (Lower):   Patient denies diarrhea and constipation.  Constitutional:   Patient reports fatigue. Patient denies fever, night sweats, and weight loss.  Skin:   Patient reports itching. Patient denies skin rash/ lesion.  Eyes:   Patient denies blurred vision and double vision.  Ears/ Nose/ Throat:   Patient reports sinus problems.  Patient denies sore throat.  Hematologic/Lymphatic:   Patient denies swollen glands and easy bruising.  Cardiovascular:   Patient denies leg swelling and chest pains.  Respiratory:   Patient reports shortness of breath. Patient denies cough.  Endocrine:   Patient denies excessive thirst.  Musculoskeletal:   Patient reports back pain and joint pain.   Neurological:   Patient denies headaches and dizziness.  Psychologic:   Patient denies depression and anxiety.   Notes: right abd pain, blood clot in underwaer, urine dark in color    VITAL SIGNS:      01/30/2019 11:02 AM  Height 64 in / 162.56 cm  BP 94/63 mmHg  Pulse 75 /min  Temperature 97.3 F / 36.2 C   MULTI-SYSTEM PHYSICAL EXAMINATION:    Constitutional: Obese. No physical deformities. Normally developed. Good grooming.   Respiratory: Normal breath sounds. No labored breathing, no use of accessory muscles.   Cardiovascular: Regular rate and rhythm. No murmur, no gallop.      PAST DATA REVIEWED:  Source Of History:  Patient  Urine Test Review:   Urinalysis   PROCEDURES:          Urinalysis w/Scope Dipstick Dipstick Cont'd Micro  Color: Amber Bilirubin: Neg mg/dL WBC/hpf: 10 - 14/NWG20/hpf  Appearance: Turbid Ketones: Trace mg/dL RBC/hpf: >95/AOZ>60/hpf  Specific Gravity: 1.020 Blood: 3+ ery/uL Bacteria: Rare (  0-9/hpf)  pH: 5.5 Protein: 3+ mg/dL Cystals: NS (Not Seen)  Glucose: Neg mg/dL Urobilinogen: 0.2 mg/dL Casts: Hyaline    Nitrites: Neg Trichomonas: Not Present    Leukocyte Esterase: 3+ leu/uL Mucous: Present      Epithelial Cells: NS (Not Seen)      Yeast: NS (Not Seen)      Sperm: Not Present    Notes: MICROSCOPIC PERFORMED ON UNCONCENTRATED URINE    ASSESSMENT:      ICD-10 Details  1 GU:   RLQ pain - R10.31 She has some increased pain in the RLQ. I will move up her stent exchange to see if that will help.  2   Hydronephrosis Unspec - N13.30   3   Chronic cystitis (w/o hematuria) - N30.20 Urine culture today and I will  adjust her meds accordingly.    PLAN:           Orders Labs Urine Culture          Schedule Return Visit/Planned Activity: Next Available Appointment - Schedule Surgery

## 2019-02-09 ENCOUNTER — Encounter (HOSPITAL_BASED_OUTPATIENT_CLINIC_OR_DEPARTMENT_OTHER): Payer: Self-pay | Admitting: *Deleted

## 2019-02-09 ENCOUNTER — Ambulatory Visit (HOSPITAL_BASED_OUTPATIENT_CLINIC_OR_DEPARTMENT_OTHER): Payer: Medicare Other | Admitting: Certified Registered"

## 2019-02-09 ENCOUNTER — Other Ambulatory Visit: Payer: Self-pay

## 2019-02-09 ENCOUNTER — Encounter (HOSPITAL_BASED_OUTPATIENT_CLINIC_OR_DEPARTMENT_OTHER)
Admission: RE | Disposition: A | Discharge status: Home / Self Care | Payer: Self-pay | Source: Home / Self Care | Attending: Urology

## 2019-02-09 ENCOUNTER — Ambulatory Visit (HOSPITAL_BASED_OUTPATIENT_CLINIC_OR_DEPARTMENT_OTHER)
Admission: RE | Admit: 2019-02-09 | Discharge: 2019-02-09 | Disposition: A | Discharge status: Home / Self Care | Payer: Medicare Other | Attending: Urology | Admitting: Urology

## 2019-02-09 DIAGNOSIS — Z79899 Other long term (current) drug therapy: Secondary | ICD-10-CM | POA: Diagnosis not present

## 2019-02-09 DIAGNOSIS — Z8042 Family history of malignant neoplasm of prostate: Secondary | ICD-10-CM | POA: Diagnosis not present

## 2019-02-09 DIAGNOSIS — Z794 Long term (current) use of insulin: Secondary | ICD-10-CM | POA: Diagnosis not present

## 2019-02-09 DIAGNOSIS — N131 Hydronephrosis with ureteral stricture, not elsewhere classified: Secondary | ICD-10-CM | POA: Diagnosis present

## 2019-02-09 DIAGNOSIS — Z6841 Body Mass Index (BMI) 40.0 and over, adult: Secondary | ICD-10-CM | POA: Diagnosis not present

## 2019-02-09 DIAGNOSIS — Z87891 Personal history of nicotine dependence: Secondary | ICD-10-CM | POA: Diagnosis not present

## 2019-02-09 DIAGNOSIS — I10 Essential (primary) hypertension: Secondary | ICD-10-CM | POA: Insufficient documentation

## 2019-02-09 DIAGNOSIS — N3020 Other chronic cystitis without hematuria: Secondary | ICD-10-CM | POA: Insufficient documentation

## 2019-02-09 DIAGNOSIS — M1990 Unspecified osteoarthritis, unspecified site: Secondary | ICD-10-CM | POA: Diagnosis not present

## 2019-02-09 DIAGNOSIS — F419 Anxiety disorder, unspecified: Secondary | ICD-10-CM | POA: Insufficient documentation

## 2019-02-09 DIAGNOSIS — K219 Gastro-esophageal reflux disease without esophagitis: Secondary | ICD-10-CM | POA: Diagnosis not present

## 2019-02-09 DIAGNOSIS — E118 Type 2 diabetes mellitus with unspecified complications: Secondary | ICD-10-CM | POA: Diagnosis not present

## 2019-02-09 DIAGNOSIS — E669 Obesity, unspecified: Secondary | ICD-10-CM | POA: Insufficient documentation

## 2019-02-09 HISTORY — DX: Spinal stenosis, lumbar region without neurogenic claudication: M48.061

## 2019-02-09 HISTORY — DX: Personal history of other diseases of the circulatory system: Z86.79

## 2019-02-09 HISTORY — PX: CYSTOSCOPY W/ URETERAL STENT PLACEMENT: SHX1429

## 2019-02-09 HISTORY — DX: Presence of dental prosthetic device (complete) (partial): Z97.2

## 2019-02-09 HISTORY — DX: Nontoxic single thyroid nodule: E04.1

## 2019-02-09 HISTORY — DX: Localized edema: R60.0

## 2019-02-09 LAB — POCT HEMOGLOBIN-HEMACUE: Hemoglobin: 11.6 g/dL — ABNORMAL LOW (ref 12.0–15.0)

## 2019-02-09 LAB — GLUCOSE, CAPILLARY: Glucose-Capillary: 108 mg/dL — ABNORMAL HIGH (ref 70–99)

## 2019-02-09 LAB — BASIC METABOLIC PANEL
Anion gap: 8 (ref 5–15)
BUN: 47 mg/dL — ABNORMAL HIGH (ref 8–23)
CO2: 26 mmol/L (ref 22–32)
Calcium: 8.9 mg/dL (ref 8.9–10.3)
Chloride: 106 mmol/L (ref 98–111)
Creatinine, Ser: 1.37 mg/dL — ABNORMAL HIGH (ref 0.44–1.00)
GFR calc Af Amer: 44 mL/min — ABNORMAL LOW (ref >60)
GFR, EST NON AFRICAN AMERICAN: 38 mL/min — AB (ref >60)
Glucose, Bld: 104 mg/dL — ABNORMAL HIGH (ref 70–99)
Potassium: 4.5 mmol/L (ref 3.5–5.1)
Sodium: 140 mmol/L (ref 135–145)

## 2019-02-09 SURGERY — CYSTOSCOPY, FLEXIBLE, WITH STENT REPLACEMENT
Anesthesia: Monitor Anesthesia Care | Site: Ureter | Laterality: Right

## 2019-02-09 MED ORDER — SODIUM CHLORIDE 0.9 % IV SOLN
250.0000 mL | INTRAVENOUS | Status: DC | PRN
  Filled 2019-02-09: qty 250

## 2019-02-09 MED ORDER — ACETAMINOPHEN 160 MG/5ML PO SOLN
325.0000 mg | Freq: Once | ORAL | Status: DC
  Filled 2019-02-09: qty 20.3

## 2019-02-09 MED ORDER — MORPHINE SULFATE (PF) 2 MG/ML IV SOLN
1.0000 mg | INTRAVENOUS | Status: DC | PRN
  Filled 2019-02-09: qty 0.5

## 2019-02-09 MED ORDER — PROPOFOL 10 MG/ML IV BOLUS
INTRAVENOUS | Status: DC | PRN
  Administered 2019-02-09: 30 mg via INTRAVENOUS

## 2019-02-09 MED ORDER — FENTANYL CITRATE (PF) 100 MCG/2ML IJ SOLN
INTRAMUSCULAR | Status: DC | PRN
  Administered 2019-02-09: 25 ug via INTRAVENOUS

## 2019-02-09 MED ORDER — ACETAMINOPHEN 325 MG PO TABS
650.0000 mg | ORAL_TABLET | ORAL | Status: DC | PRN
  Filled 2019-02-09: qty 2

## 2019-02-09 MED ORDER — FENTANYL CITRATE (PF) 100 MCG/2ML IJ SOLN
INTRAMUSCULAR | Status: AC
  Filled 2019-02-09: qty 2

## 2019-02-09 MED ORDER — FENTANYL CITRATE (PF) 100 MCG/2ML IJ SOLN
25.0000 ug | INTRAMUSCULAR | Status: DC | PRN
  Administered 2019-02-09: 25 ug via INTRAVENOUS
  Filled 2019-02-09: qty 1

## 2019-02-09 MED ORDER — LIDOCAINE 2% (20 MG/ML) 5 ML SYRINGE
INTRAMUSCULAR | Status: DC | PRN
  Administered 2019-02-09: 50 mg via INTRAVENOUS

## 2019-02-09 MED ORDER — ACETAMINOPHEN 325 MG PO TABS
325.0000 mg | ORAL_TABLET | Freq: Once | ORAL | Status: DC
  Filled 2019-02-09: qty 2

## 2019-02-09 MED ORDER — SODIUM CHLORIDE 0.9% FLUSH
3.0000 mL | INTRAVENOUS | Status: DC | PRN
  Filled 2019-02-09: qty 3

## 2019-02-09 MED ORDER — MEPERIDINE HCL 25 MG/ML IJ SOLN
6.2500 mg | INTRAMUSCULAR | Status: DC | PRN
  Filled 2019-02-09: qty 1

## 2019-02-09 MED ORDER — ACETAMINOPHEN 10 MG/ML IV SOLN
1000.0000 mg | Freq: Once | INTRAVENOUS | Status: DC | PRN
  Filled 2019-02-09: qty 100

## 2019-02-09 MED ORDER — OXYCODONE HCL 5 MG PO TABS
5.0000 mg | ORAL_TABLET | ORAL | Status: DC | PRN
  Filled 2019-02-09: qty 2

## 2019-02-09 MED ORDER — ACETAMINOPHEN 650 MG RE SUPP
650.0000 mg | RECTAL | Status: DC | PRN
  Filled 2019-02-09: qty 1

## 2019-02-09 MED ORDER — SODIUM CHLORIDE 0.9% FLUSH
3.0000 mL | Freq: Two times a day (BID) | INTRAVENOUS | Status: DC
  Filled 2019-02-09: qty 3

## 2019-02-09 MED ORDER — LACTATED RINGERS IV SOLN
INTRAVENOUS | Status: DC
  Filled 2019-02-09: qty 1000

## 2019-02-09 MED ORDER — LIDOCAINE 2% (20 MG/ML) 5 ML SYRINGE
INTRAMUSCULAR | Status: AC
  Filled 2019-02-09: qty 5

## 2019-02-09 MED ORDER — PROMETHAZINE HCL 25 MG/ML IJ SOLN
6.2500 mg | INTRAMUSCULAR | Status: DC | PRN
  Filled 2019-02-09: qty 1

## 2019-02-09 MED ORDER — PROMETHAZINE HCL 25 MG/ML IJ SOLN: 6.2500 mg | INTRAMUSCULAR | Status: DC | PRN

## 2019-02-09 MED ORDER — CIPROFLOXACIN IN D5W 400 MG/200ML IV SOLN
INTRAVENOUS | Status: AC
  Filled 2019-02-09: qty 200

## 2019-02-09 MED ORDER — PROPOFOL 500 MG/50ML IV EMUL
INTRAVENOUS | Status: DC | PRN
  Administered 2019-02-09: 150 ug/kg/min via INTRAVENOUS

## 2019-02-09 MED ORDER — PROPOFOL 10 MG/ML IV BOLUS
INTRAVENOUS | Status: AC
  Filled 2019-02-09: qty 20

## 2019-02-09 MED ORDER — PROPOFOL 500 MG/50ML IV EMUL
INTRAVENOUS | Status: AC
  Filled 2019-02-09: qty 50

## 2019-02-09 MED ORDER — FENTANYL CITRATE (PF) 100 MCG/2ML IJ SOLN
25.0000 ug | INTRAMUSCULAR | Status: DC | PRN
  Filled 2019-02-09: qty 1

## 2019-02-09 MED ORDER — CIPROFLOXACIN IN D5W 400 MG/200ML IV SOLN
400.0000 mg | INTRAVENOUS | Status: AC
  Administered 2019-02-09: 400 mg via INTRAVENOUS
  Filled 2019-02-09: qty 200

## 2019-02-09 MED ORDER — LACTATED RINGERS IV SOLN
INTRAVENOUS | Status: DC
  Administered 2019-02-09: 08:00:00 via INTRAVENOUS
  Filled 2019-02-09: qty 1000

## 2019-02-09 SURGICAL SUPPLY — 24 items
BAG DRAIN URO-CYSTO SKYTR STRL (DRAIN) ×3 IMPLANT
BASKET STONE 1.7 NGAGE (UROLOGICAL SUPPLIES) IMPLANT
BASKET ZERO TIP NITINOL 2.4FR (BASKET) IMPLANT
CATH URET 5FR 28IN CONE TIP (BALLOONS)
CATH URET 5FR 28IN OPEN ENDED (CATHETERS) IMPLANT
CATH URET 5FR 70CM CONE TIP (BALLOONS) IMPLANT
CLOTH BEACON ORANGE TIMEOUT ST (SAFETY) ×3 IMPLANT
ELECT REM PT RETURN 9FT ADLT (ELECTROSURGICAL)
ELECTRODE REM PT RTRN 9FT ADLT (ELECTROSURGICAL) IMPLANT
FIBER LASER FLEXIVA 365 (UROLOGICAL SUPPLIES) IMPLANT
FIBER LASER TRAC TIP (UROLOGICAL SUPPLIES) IMPLANT
GLOVE SURG SS PI 8.0 STRL IVOR (GLOVE) ×3 IMPLANT
GOWN STRL REUS W/TWL XL LVL3 (GOWN DISPOSABLE) ×3 IMPLANT
GUIDEWIRE ANG ZIPWIRE 038X150 (WIRE) IMPLANT
GUIDEWIRE STR DUAL SENSOR (WIRE) ×3 IMPLANT
IV NS IRRIG 3000ML ARTHROMATIC (IV SOLUTION) ×3 IMPLANT
KIT TURNOVER CYSTO (KITS) ×3 IMPLANT
MANIFOLD NEPTUNE II (INSTRUMENTS) ×3 IMPLANT
NS IRRIG 500ML POUR BTL (IV SOLUTION) ×3 IMPLANT
PACK CYSTO (CUSTOM PROCEDURE TRAY) ×3 IMPLANT
STENT URET 6FRX24 CONTOUR (STENTS) ×3 IMPLANT
TUBE CONNECTING 12'X1/4 (SUCTIONS) ×1
TUBE CONNECTING 12X1/4 (SUCTIONS) ×2 IMPLANT
TUBING UROLOGY SET (TUBING) IMPLANT

## 2019-02-09 NOTE — Discharge Instructions (Addendum)
°Post Anesthesia Home Care Instructions ° °Activity: °Get plenty of rest for the remainder of the day. A responsible individual must stay with you for 24 hours following the procedure.  °For the next 24 hours, DO NOT: °-Drive a car °-Operate machinery °-Drink alcoholic beverages °-Take any medication unless instructed by your physician °-Make any legal decisions or sign important papers. ° °Meals: °Start with liquid foods such as gelatin or soup. Progress to regular foods as tolerated. Avoid greasy, spicy, heavy foods. If nausea and/or vomiting occur, drink only clear liquids until the nausea and/or vomiting subsides. Call your physician if vomiting continues. ° °Special Instructions/Symptoms: °Your throat may feel dry or sore from the anesthesia or the breathing tube placed in your throat during surgery. If this causes discomfort, gargle with warm salt water. The discomfort should disappear within 24 hours. ° °If you had a scopolamine patch placed behind your ear for the management of post- operative nausea and/or vomiting: ° °1. The medication in the patch is effective for 72 hours, after which it should be removed.  Wrap patch in a tissue and discard in the trash. Wash hands thoroughly with soap and water. °2. You may remove the patch earlier than 72 hours if you experience unpleasant side effects which may include dry mouth, dizziness or visual disturbances. °3. Avoid touching the patch. Wash your hands with soap and water after contact with the patch. °  °Ureteral Stent Implantation, Care After °Refer to this sheet in the next few weeks. These instructions provide you with information about caring for yourself after your procedure. Your health care provider may also give you more specific instructions. Your treatment has been planned according to current medical practices, but problems sometimes occur. Call your health care provider if you have any problems or questions after your procedure. °What can I expect  after the procedure? °After the procedure, it is common to have: °· Nausea. °· Mild pain when you urinate. You may feel this pain in your lower back or lower abdomen. Pain should stop within a few minutes after you urinate. This may last for up to 1 week. °· A small amount of blood in your urine for several days. °Follow these instructions at home: ° °Medicines °· Take over-the-counter and prescription medicines only as told by your health care provider. °· If you were prescribed an antibiotic medicine, take it as told by your health care provider. Do not stop taking the antibiotic even if you start to feel better. °· Do not drive for 24 hours if you received a sedative. °· Do not drive or operate heavy machinery while taking prescription pain medicines. °Activity °· Return to your normal activities as told by your health care provider. Ask your health care provider what activities are safe for you. °· Do not lift anything that is heavier than 10 lb (4.5 kg). Follow this limit for 1 week after your procedure, or for as long as told by your health care provider. °General instructions °· Watch for any blood in your urine. Call your health care provider if the amount of blood in your urine increases. °· If you have a catheter: °? Follow instructions from your health care provider about taking care of your catheter and collection bag. °? Do not take baths, swim, or use a hot tub until your health care provider approves. °· Drink enough fluid to keep your urine clear or pale yellow. °· Keep all follow-up visits as told by your health care provider. This   is important. °Contact a health care provider if: °· You have pain that gets worse or does not get better with medicine, especially pain when you urinate. °· You have difficulty urinating. °· You feel nauseous or you vomit repeatedly during a period of more than 2 days after the procedure. °Get help right away if: °· Your urine is dark red or has blood clots in it. °· You  are leaking urine (have incontinence). °· The end of the stent comes out of your urethra. °· You cannot urinate. °· You have sudden, sharp, or severe pain in your abdomen or lower back. °· You have a fever. °This information is not intended to replace advice given to you by your health care provider. Make sure you discuss any questions you have with your health care provider. °Document Released: 08/16/2013 Document Revised: 05/21/2016 Document Reviewed: 06/28/2015 °Elsevier Interactive Patient Education © 2019 [employer] ° °

## 2019-02-09 NOTE — Anesthesia Postprocedure Evaluation (Signed)
Anesthesia Post Note  Patient: Valerie [name]  Procedure(s) Performed: CYSTOSCOPY WITH STENT REPLACEMENT (Right Ureter)     Patient location during evaluation: PACU Anesthesia Type: MAC Level of consciousness: awake and alert Pain management: pain level controlled Vital Signs Assessment: post-procedure vital signs reviewed and stable Respiratory status: spontaneous breathing, nonlabored ventilation, respiratory function stable and patient connected to nasal cannula oxygen Cardiovascular status: stable and blood pressure returned to baseline Postop Assessment: no apparent nausea or vomiting Anesthetic complications: no    Last Vitals:  Vitals:   02/09/19 1030 02/09/19 1045  BP: 136/66 125/64  Pulse: 66 65  Resp: (!) 22 14  Temp:    SpO2: 97% 96%    Last Pain:  Vitals:   02/09/19 1045  TempSrc:   PainSc: 0-No pain                 Effie Berkshire

## 2019-02-09 NOTE — Interval H&P Note (Signed)
History and Physical Interval Note:  02/09/2019 9:02 AM  Valerie JakschBeverly H [name]  has presented today for surgery, with the diagnosis of RIGHT HYDRONEPHROSIS  The various methods of treatment have been discussed with the patient and family. After consideration of risks, benefits and other options for treatment, the patient has consented to  Procedure(s): CYSTOSCOPY WITH STENT REPLACEMENT (Right) as a surgical intervention .  The patient's history has been reviewed, patient examined, no change in status, stable for surgery.  I have reviewed the patient's chart and labs.  Questions were answered to the patient's satisfaction.     Bjorn PippinJohn Finis Hendricksen

## 2019-02-09 NOTE — Op Note (Signed)
Procedure: 1.  Cystoscopy with removal of right double-J stent. 2.  Cystoscopy with placement of right double-J stent.  Preop diagnosis: Chronic right ureteral obstruction.  Postop diagnosis: Same.  Surgeon: Dr. [name].  Anesthesia: MAC.  Drain: 6 [place] by 24 cm right contour double-J stent.  Specimen: None.  EBL: None.  Complications: None.  Indications: Valerie [name] returns for routine right ureteral stent change.  Procedure: She was taken operating room where she was given 40 mg of Cipro IV.  She was placed in lithotomy position and sedation was given as needed.  She was fitted with PAS hose.  Her perineum and genitalia were prepped with Betadine solution and she was draped in usual sterile fashion.  Cystoscopy was performed using a 23 [place] scope and 30 degree lens.  The old stent was grasped with a grasping forceps and pulled the urethral meatus.  A guidewire was advanced to the kidney through the stent and the stent was removed.  The cystoscope was reinserted over the wire.  Inspection of the bladder demonstrated some edema of the mucosa adjacent to the right ureteral orifice but no other abnormalities.  A fresh 6 [place] by 24 cm contour double-J stent without tether was passed to the kidney under fluoroscopic guidance.  The wire was removed, leaving good coil in the kidney and a good coil in the bladder.  The bladder was drained and the cystoscope was removed.  She was taken down from lithotomy position and moved recovery in stable condition.  There were no complications.

## 2019-02-09 NOTE — Anesthesia Preprocedure Evaluation (Addendum)
Anesthesia Evaluation  Patient identified by MRN, date of birth, ID band Patient awake    Reviewed: Allergy & Precautions, NPO status , Patient's Chart, lab work & pertinent test results  Airway Mallampati: III  TM Distance: >3 FB Neck ROM: Full    Dental  (+) Upper Dentures   Pulmonary former smoker,     + decreased breath sounds      Cardiovascular hypertension, + dysrhythmias  Rhythm:Regular Rate:Normal     Neuro/Psych Anxiety negative neurological ROS     GI/Hepatic Neg liver ROS, GERD  Medicated,  Endo/Other  diabetes, Type 2, Insulin Dependent  Renal/GU      Musculoskeletal  (+) Arthritis , Osteoarthritis,    Abdominal (+) + obese,   Peds  Hematology   Anesthesia Other Findings   Reproductive/Obstetrics                            Lab Results  Component Value Date   WBC 6.1 07/26/2018   HGB 11.6 (L) 02/09/2019   HCT 37.0 11/29/2018   MCV 86.9 07/26/2018   PLT 147 (L) 07/26/2018   Lab Results  Component Value Date   CREATININE 1.37 (H) 02/09/2019   BUN 47 (H) 02/09/2019   NA 140 02/09/2019   K 4.5 02/09/2019   CL 106 02/09/2019   CO2 26 02/09/2019   No results found for: INR, PROTIME  EKG: normal sinus rhythm, RBBB.  Anesthesia Physical Anesthesia Plan  ASA: III  Anesthesia Plan: MAC   Post-op Pain Management:    Induction: Intravenous  PONV Risk Score and Plan: 3 and Ondansetron, Dexamethasone and Propofol infusion  Airway Management Planned: Simple Face Mask and Natural Airway  Additional Equipment: None  Intra-op Plan:   Post-operative Plan:   Informed Consent: I have reviewed the patients History and Physical, chart, labs and discussed the procedure including the risks, benefits and alternatives for the proposed anesthesia with the patient or authorized representative who has indicated his/her understanding and acceptance.       Plan Discussed  with: CRNA  Anesthesia Plan Comments:        Anesthesia Quick Evaluation

## 2019-02-09 NOTE — Transfer of Care (Signed)
Immediate Anesthesia Transfer of Care Note  Patient: Valerie [name]  Procedure(s) Performed: Procedure(s) (LRB): CYSTOSCOPY WITH STENT REPLACEMENT (Right)  Patient Location: PACU  Anesthesia Type: MAC  Level of Consciousness: awake, alert , oriented and patient cooperative  Airway & Oxygen Therapy: Patient Spontanous Breathing and Patient connected to face mask oxygen  Post-op Assessment: Report given to PACU RN and Post -op Vital signs reviewed and stable  Post vital signs: Reviewed and stable  Complications: No apparent anesthesia complications Last Vitals:  Vitals Value Taken Time  BP    Temp    Pulse 67 02/09/2019  9:56 AM  Resp 13 02/09/2019  9:56 AM  SpO2 94 % 02/09/2019  9:56 AM  Vitals shown include unvalidated device data.  Last Pain:  Vitals:   02/09/19 0732  TempSrc:   PainSc: 7       Patients Stated Pain Goal: 6 (02/09/19 0732)

## 2019-02-10 ENCOUNTER — Encounter (HOSPITAL_BASED_OUTPATIENT_CLINIC_OR_DEPARTMENT_OTHER): Payer: Self-pay | Admitting: Urology

## 2019-02-28 ENCOUNTER — Encounter (HOSPITAL_BASED_OUTPATIENT_CLINIC_OR_DEPARTMENT_OTHER): Payer: Self-pay

## 2019-02-28 ENCOUNTER — Encounter (HOSPITAL_BASED_OUTPATIENT_CLINIC_OR_DEPARTMENT_OTHER): Payer: Medicare Other | Attending: Internal Medicine

## 2019-02-28 DIAGNOSIS — I10 Essential (primary) hypertension: Secondary | ICD-10-CM | POA: Insufficient documentation

## 2019-02-28 DIAGNOSIS — Y838 Other surgical procedures as the cause of abnormal reaction of the patient, or of later complication, without mention of misadventure at the time of the procedure: Secondary | ICD-10-CM | POA: Insufficient documentation

## 2019-02-28 DIAGNOSIS — E1140 Type 2 diabetes mellitus with diabetic neuropathy, unspecified: Secondary | ICD-10-CM | POA: Insufficient documentation

## 2019-02-28 DIAGNOSIS — E1136 Type 2 diabetes mellitus with diabetic cataract: Secondary | ICD-10-CM | POA: Insufficient documentation

## 2019-02-28 DIAGNOSIS — T8189XA Other complications of procedures, not elsewhere classified, initial encounter: Secondary | ICD-10-CM | POA: Diagnosis present

## 2019-04-04 ENCOUNTER — Encounter (HOSPITAL_BASED_OUTPATIENT_CLINIC_OR_DEPARTMENT_OTHER): Payer: Medicare Other | Attending: Internal Medicine

## 2019-04-12 ENCOUNTER — Ambulatory Visit: Payer: Medicare Other | Admitting: Podiatry

## 2019-05-12 ENCOUNTER — Other Ambulatory Visit: Payer: Self-pay | Admitting: Urology

## 2019-05-12 NOTE — Patient Instructions (Addendum)
Valerie [name]  05/12/2019   YOU ARE REQUIRED TO BE TESTED FOR COVID-19 PRIOR TO YOUR SURGERY . YOUR TEST MUST BE COMPLETED ON May 19, 2019. [facility] FROM 9:00AM - 3:00PM. FAILURE TO COMPLETE TESTING MAY RESULT IN CANCELLATION OF YOUR SURGERY.   ________________________________________________________________________    Your procedure is scheduled on: 05-12-2019   [facility]    Report to admitting at 8:30AM      Call this number if you have problems the morning of surgery [phone]       Remember: Do not eat food or drink liquids :After Midnight. BRUSH YOUR TEETH MORNING OF SURGERY AND RINSE YOUR MOUTH OUT, NO CHEWING GUM CANDY OR MINTS.     Take these medicines the morning of surgery with A SIP OF WATER: Wellbutrin, atorvastatin, pantoprazole, Ampicillin    DIABETES MANAGEMENT DO NOT TAKE YOUR BEDTIME DOSE OF HUMALOG INSULIN THE NIGHT BEFORE SURGERY  DO NOT TAKE ANY ORAL DIABETES MEDICATION THE DAY OF SURGERY!   PLEASE CHECK YOUR BLOOD SUGAR THE DAY OF SURGERY. REPORT TO YOUR NURSE ON ARRIVAL.  TAKE HALF DOSE OF YOUR TOUJEO THE MORNING OF SURGERY  IF YOUR BLOOD SUGAR IS GREATER THAN 220 THE MORNING OF SURGERY, YOU MAY TAKE HALF YOUR NORMAL DOSE OF HUMALOG INSULIN  IF YOUR BLOOD SUGAR IS LESS THAN 70 THE MORNING SURGERY WHEN YOU CHECK IT AT HOME, YOU NEED TO CALL THE [phone] FOR FURTHER INSTRUCTIONS!                                  You may not have any metal on your body including hair pins and              piercings  Do not wear jewelry, make-up, lotions, powders or perfumes, deodorant             Do not wear nail polish.  Do not shave  48 hours prior to surgery.                 Do not bring valuables to the hospital. [facility] IS NOT             RESPONSIBLE   FOR VALUABLES.  Contacts, dentures or bridgework may not be worn into  surgery.      Patients discharged the day of surgery will not be allowed to drive home. IF YOU ARE HAVING SURGERY AND GOING HOME THE SAME DAY, YOU MUST HAVE AN ADULT TO DRIVE YOU HOME AND BE WITH YOU FOR 24 HOURS. YOU MAY GO HOME BY TAXI OR UBER OR ORTHERWISE, BUT AN ADULT MUST ACCOMPANY YOU HOME AND STAY WITH YOU FOR 24 HOURS.  Name and phone number of your driver:  Special Instructions: N/A              Please read over the following fact sheets you were given: _____________________________________________________________________             [facility] Before surgery, you can play an important role.  Because skin is not sterile, your skin needs to be as free of germs as possible.  You can reduce the number of germs on your skin by washing with CHG (chlorahexidine gluconate) soap before  surgery.  CHG is an antiseptic cleaner which kills germs and bonds with the skin to continue killing germs even after washing. Please DO NOT use if you have an allergy to CHG or antibacterial soaps.  If your skin becomes reddened/irritated stop using the CHG and inform your nurse when you arrive at Short Stay. Do not shave (including legs and underarms) for at least 48 hours prior to the first CHG shower.  You may shave your face/neck. Please follow these instructions carefully:  1.  Shower with CHG Soap the night before surgery and the  morning of Surgery.  2.  If you choose to wash your hair, wash your hair first as usual with your  normal  shampoo.  3.  After you shampoo, rinse your hair and body thoroughly to remove the  shampoo.                           4.  Use CHG as you would any other liquid soap.  You can apply chg directly  to the skin and wash                       Gently with a scrungie or clean washcloth.  5.  Apply the CHG Soap to your body ONLY FROM THE NECK DOWN.   Do not use on face/ open                           Wound or open sores. Avoid contact with eyes, ears  mouth and genitals (private parts).                       Wash face,  Genitals (private parts) with your normal soap.             6.  Wash thoroughly, paying special attention to the area where your surgery  will be performed.  7.  Thoroughly rinse your body with warm water from the neck down.  8.  DO NOT shower/wash with your normal soap after using and rinsing off  the CHG Soap.                9.  Pat yourself dry with a clean towel.            10.  Wear clean pajamas.            11.  Place clean sheets on your bed the night of your first shower and do not  sleep with pets. Day of Surgery : Do not apply any lotions/deodorants the morning of surgery.  Please wear clean clothes to the hospital/surgery center.  FAILURE TO FOLLOW THESE INSTRUCTIONS MAY RESULT IN THE CANCELLATION OF YOUR SURGERY PATIENT SIGNATURE_________________________________  NURSE SIGNATURE__________________________________  ________________________________________________________________________

## 2019-05-12 NOTE — Progress Notes (Signed)
ekg 08-11-18  cxr 07-09-18 [facility] endocrinology 12-30-2018

## 2019-05-15 ENCOUNTER — Encounter (HOSPITAL_COMMUNITY)
Admission: RE | Admit: 2019-05-15 | Discharge: 2019-05-15 | Disposition: A | Discharge status: Home / Self Care | Payer: Medicare Other | Source: Ambulatory Visit | Attending: Urology | Admitting: Urology

## 2019-05-15 ENCOUNTER — Encounter (HOSPITAL_COMMUNITY): Payer: Self-pay

## 2019-05-15 ENCOUNTER — Other Ambulatory Visit: Payer: Self-pay

## 2019-05-15 DIAGNOSIS — Z01812 Encounter for preprocedural laboratory examination: Secondary | ICD-10-CM | POA: Insufficient documentation

## 2019-05-15 DIAGNOSIS — N132 Hydronephrosis with renal and ureteral calculous obstruction: Secondary | ICD-10-CM | POA: Insufficient documentation

## 2019-05-15 LAB — BASIC METABOLIC PANEL
Anion gap: 7 (ref 5–15)
BUN: 41 mg/dL — ABNORMAL HIGH (ref 8–23)
CO2: 29 mmol/L (ref 22–32)
Calcium: 9.1 mg/dL (ref 8.9–10.3)
Chloride: 103 mmol/L (ref 98–111)
Creatinine, Ser: 1.23 mg/dL — ABNORMAL HIGH (ref 0.44–1.00)
GFR calc Af Amer: 50 mL/min — ABNORMAL LOW (ref >60)
GFR calc non Af Amer: 43 mL/min — ABNORMAL LOW (ref >60)
Glucose, Bld: 202 mg/dL — ABNORMAL HIGH (ref 70–99)
Potassium: 5.7 mmol/L — ABNORMAL HIGH (ref 3.5–5.1)
Sodium: 139 mmol/L (ref 135–145)

## 2019-05-15 LAB — CBC
HCT: 42.2 % (ref 36.0–46.0)
Hemoglobin: 12.6 g/dL (ref 12.0–15.0)
MCH: 27.5 pg (ref 26.0–34.0)
MCHC: 29.9 g/dL — ABNORMAL LOW (ref 30.0–36.0)
MCV: 92.1 fL (ref 80.0–100.0)
Platelets: 151 10*3/uL (ref 150–400)
RBC: 4.58 MIL/uL (ref 3.87–5.11)
RDW: 14.6 % (ref 11.5–15.5)
WBC: 5.6 10*3/uL (ref 4.0–10.5)
nRBC: 0 % (ref 0.0–0.2)

## 2019-05-15 LAB — GLUCOSE, CAPILLARY: Glucose-Capillary: 185 mg/dL — ABNORMAL HIGH (ref 70–99)

## 2019-05-15 LAB — HEMOGLOBIN A1C
Hgb A1c MFr Bld: 6.8 % — ABNORMAL HIGH (ref 4.8–5.6)
Mean Plasma Glucose: 148.46 mg/dL

## 2019-05-16 NOTE — Anesthesia Preprocedure Evaluation (Addendum)
Anesthesia Evaluation  Patient identified by MRN, date of birth, ID band Patient awake    Reviewed: Allergy & Precautions, H&P , NPO status , Patient's Chart, lab work & pertinent test results, reviewed documented beta blocker date and time   Airway Mallampati: III  TM Distance: >3 FB Neck ROM: full    Dental no notable dental hx. (+) Upper Dentures   Pulmonary neg pulmonary ROS, former smoker,    Pulmonary exam normal breath sounds clear to auscultation       Cardiovascular Exercise Tolerance: Good hypertension, Pt. on medications + dysrhythmias  Rhythm:regular Rate:Normal  EKG 8/19 Normal sinus rhythm Right bundle branch block Left anterior fascicular block  Moderate voltage criteria for LVH, may be normal variant   Neuro/Psych negative neurological ROS  negative psych ROS   GI/Hepatic Neg liver ROS, GERD  Medicated,  Endo/Other  negative endocrine ROSdiabetes, Type 2  Renal/GU CRFRenal disease  negative genitourinary   Musculoskeletal   Abdominal   Peds  Hematology  (+) Blood dyscrasia, anemia ,   Anesthesia Other Findings   Reproductive/Obstetrics negative OB ROS                            Anesthesia Physical Anesthesia Plan  ASA: III  Anesthesia Plan: General   Post-op Pain Management:    Induction:   PONV Risk Score and Plan: 3 and Ondansetron and Treatment may vary due to age or medical condition  Airway Management Planned: LMA  Additional Equipment:   Intra-op Plan:   Post-operative Plan:   Informed Consent: I have reviewed the patients History and Physical, chart, labs and discussed the procedure including the risks, benefits and alternatives for the proposed anesthesia with the patient or authorized representative who has indicated his/her understanding and acceptance.     Dental Advisory Given  Plan Discussed with: CRNA, Anesthesiologist and  Surgeon  Anesthesia Plan Comments: (See PAT note 05/15/19, Jodell Cipro.  Recheck Potassium DOS  K 5.8)       Anesthesia Quick Evaluation

## 2019-05-16 NOTE — Progress Notes (Signed)
Anesthesia Chart Review   Case:  [id number] Date/Time:  05/23/19 1015   Procedure:  CYSTOSCOPY WITH STENT EXCHANGE (Right )   Anesthesia type:  General   Pre-op diagnosis:  RIGHT URETERAL OBSTRUCTION   Location:  [facility]   Surgeon:  Bjorn Pippin, MD      DISCUSSION: 73 yo former smoker (40 pack years, quit 04/12/84) with h/o HLD, GERD, anxiety, RBBB, CKD Stage III, DM s/o pancreatectomy, anemia, right ureteral obstruction scheduled for above procedure 05/23/2019 with Dr. Bjorn Pippin.    S/p cystoscopy with stent placement 02/09/19 with no anesthesia complications noted.  Potassium at PAT 05/15/19 5.7.  Surgeon made aware.  Will recheck DOS.    VS: BP 127/71   Pulse 69   Temp 37 C (Oral)   Resp 16   Wt 109.5 kg   SpO2 95%   BMI 41.43 kg/m   PROVIDERS: Blair Heys, MD is PCP   Altheimer, [place], MD is Endocrinologist last seen 03/30/2019 LABS: Surgeon made aware of Potassium, will recheck DOS (all labs ordered are listed, but only abnormal results are displayed)  Labs Reviewed  HEMOGLOBIN A1C - Abnormal; Notable for the following components:      Result Value   Hgb A1c MFr Bld 6.8 (*)    All other components within normal limits  BASIC METABOLIC PANEL - Abnormal; Notable for the following components:   Potassium 5.7 (*)    Glucose, Bld 202 (*)    BUN 41 (*)    Creatinine, Ser 1.23 (*)    GFR calc non Af Amer 43 (*)    GFR calc Af Amer 50 (*)    All other components within normal limits  CBC - Abnormal; Notable for the following components:   MCHC 29.9 (*)    All other components within normal limits  GLUCOSE, CAPILLARY - Abnormal; Notable for the following components:   Glucose-Capillary 185 (*)    All other components within normal limits     IMAGES:   EKG: 08/11/18 Rate 88 bpm Normal sinus rhythm Right bundle branch block Left anterior fascicular block Bifascicular block  Moderate voltage criteria for LVH, may be normal  variant Abnormal ECG No significant change since last tracing  CV:  Past Medical History:  Diagnosis Date  . Allergic rhinitis   . Anemia   . Anxiety   . Bifascicular block   . Bilateral lower extremity edema   . Chronic low back pain    FOLLOWED BY PAIN CLINIC  . Chronic narcotic use   . CKD (chronic kidney disease), stage III (HCC)   . Degenerative arthritis of spine   . Diabetic neuropathy (HCC)   . Dyspnea on exertion   . GERD (gastroesophageal reflux disease)   . History of acute respiratory failure 1985   in icu   . History of hypertension    per pt no blood pressure since fall 2019 due to low blood pressure  . History of pancreatitis 1985   necrotizing pancreatitis s/p partial pancreatectomy  . History of recurrent UTIs   . History of small bowel obstruction    01-21-2016  bowel resection  . Hydronephrosis of right kidney 2017  . Hyperlipidemia   . Hypotension   . Mixed dyslipidemia   . RBBB (right bundle branch block)   . Severe obesity (HCC)   . Spinal stenosis of lumbar region   . Thyroid nodule 2017   RIght  . Type 2 diabetes mellitus with  insulin therapy [facility])    endocrinologist-  dr altheimer--  , uncontrolled post-pancreatectomy insulinopenic insulin requiring diabetes  . Wears dentures    upper  . Wears glasses   . Wound healing, delayed 01/15/2016   02-07-2019  currently per pt almost closed upper abd. wound,  dressing change q2d with Iodoflex pad ;   hx wound vac,s/p debridement fasciitis--  upper abdominal area , not open area just red w/ scar tissue dressing change every other day    Past Surgical History:  Procedure Laterality Date  . CATARACT EXTRACTION W/ INTRAOCULAR LENS  IMPLANT, BILATERAL  2016  . CYSTOSCOPY W/ URETERAL STENT PLACEMENT Right 10/28/2017   Procedure: CYSTOSCOPY WITH STENT REPLACEMENT;  Surgeon: Bjorn Pippin, MD;  Location: [facility];  Service: Urology;  Laterality: Right;  . CYSTOSCOPY W/ URETERAL STENT  PLACEMENT Right 08/11/2018   Procedure: CYSTOSCOPY WITH RIGHT STENT EXCHANGE;  Surgeon: Bjorn Pippin, MD;  Location: [facility];  Service: Urology;  Laterality: Right;  . CYSTOSCOPY W/ URETERAL STENT PLACEMENT Right 11/29/2018   Procedure: CYSTOSCOPY WITH STENT REPLACEMENT;  Surgeon: Bjorn Pippin, MD;  Location[facility];  Service: Urology;  Laterality: Right;  . CYSTOSCOPY W/ URETERAL STENT PLACEMENT Right 02/09/2019   Procedure: CYSTOSCOPY WITH STENT REPLACEMENT;  Surgeon: Bjorn Pippin, MD;  Location: [facility];  Service: Urology;  Laterality: Right;  . CYSTOSCOPY WITH BIOPSY N/A 04/15/2017   Procedure: CYSTOSCOPY WITH BIOPSY;  Surgeon: Bjorn Pippin, MD;  Location: [facility];  Service: Urology;  Laterality: N/A;  . CYSTOSCOPY WITH RETROGRADE PYELOGRAM, URETEROSCOPY AND STENT PLACEMENT Bilateral 04/02/2016   Procedure: CYSTO, RIGHT RETROGRADE,  RIGHT URETEROSCOPY AND STENT ;  Surgeon: Bjorn Pippin, MD;  Location: [facility];  Service: Urology;  Laterality: Bilateral;  . CYSTOSCOPY WITH RETROGRADE PYELOGRAM, URETEROSCOPY AND STENT PLACEMENT Right 04/15/2017   Procedure: CYSTOSCOPY WITH RIGHT RETROGRADE URETEROSCOPY AND RIGHT STENT PLACEMENT;  Surgeon: Bjorn Pippin, MD;  Location: [facility];  Service: Urology;  Laterality: Right;  . CYSTOSCOPY WITH STENT PLACEMENT Right 07/01/2017   Procedure: CYSTOSCOPY WITH RIGHT STENT EXCHANGE;  Surgeon: Bjorn Pippin, MD;  Location: [facility];  Service: Urology;  Laterality: Right;  . CYSTOSCOPY WITH STENT PLACEMENT Right 03/22/2018   Procedure: CYSTOSCOPY WITH RIGHT STENT EXCHANGE;  Surgeon: Bjorn Pippin, MD;  Location: [facility];  Service: Urology;  Laterality: Right;  . CYSTOSCOPY WITH URETEROSCOPY Right 04/21/2016   Procedure: CYSTOSCOPY WITH RIGHT URETEROSCOPY; RIGHT STENT EXCHANGE;  Surgeon: Bjorn Pippin, MD;  Location: [facility];   Service: Urology;  Laterality: Right;  . LAPAROTOMY N/A 01/15/2016   Procedure: EXPLORATORY LAPAROTOMY, LYSIS OF ADHESIONS, EXTENSIVE DEBRIDEMENT OF SKIN AND SUBCUTANEOUS TISSUE AND MUSCLE, REPAIR OF INCARCERATED INCISION HERNIA WITH BIOLOGIC MESH, APPLICATION OF NEGATIVE PRESSURE DRESSING;  Surgeon: Claud Kelp, MD;  Location: [facility];  Service: General;  Laterality: N/A;  . MULTIPLE TOOTH EXTRACTIONS    . PARTIAL PANCREATECTOMY  1985   for necrotizing pancreatitis    MEDICATIONS: . ampicillin (PRINCIPEN) 500 MG capsule  . atorvastatin (LIPITOR) 10 MG tablet  . augmented betamethasone dipropionate (DIPROLENE-AF) 0.05 % cream  . buPROPion (WELLBUTRIN XL) 150 MG 24 hr tablet  . celecoxib (CELEBREX) 200 MG capsule  . cephALEXin (KEFLEX) 500 MG capsule  . dextromethorphan-guaiFENesin (MUCINEX DM) 30-600 MG 12hr tablet  . furosemide (LASIX) 20 MG tablet  . gabapentin (NEURONTIN) 300 MG capsule  . Insulin Glargine (TOUJEO SOLOSTAR) 300 UNIT/ML  SOPN  . insulin lispro (HUMALOG) 100 UNIT/ML injection  . mirabegron ER (MYRBETRIQ) 25 MG TB24 tablet  . mometasone (NASONEX) 50 MCG/ACT nasal spray  . Multiple Vitamins-Minerals (ONE-A-DAY WOMENS 50 PLUS PO)  . nystatin (MYCOSTATIN) 100000 UNIT/ML suspension  . Omega-3 Fatty Acids (FISH OIL) 1000 MG CAPS  . oxyCODONE (OXYCONTIN) 40 mg 12 hr tablet  . oxymorphone (OPANA) 10 MG tablet  . pantoprazole (PROTONIX) 40 MG tablet  . polyethylene glycol (MIRALAX / GLYCOLAX) packet  . promethazine (PHENERGAN) 25 MG tablet   No current facility-administered medications for this encounter.     Jodell CiproJessica Maretta Overdorf, PA-C [facility]  [phone] 05/16/19 1:24 PM

## 2019-05-17 ENCOUNTER — Ambulatory Visit: Payer: Medicare Other | Admitting: Podiatry

## 2019-05-19 ENCOUNTER — Other Ambulatory Visit (HOSPITAL_COMMUNITY)
Admission: RE | Admit: 2019-05-19 | Discharge: 2019-05-19 | Disposition: A | Discharge status: Home / Self Care | Payer: Medicare Other | Source: Ambulatory Visit | Attending: Urology | Admitting: Urology

## 2019-05-19 ENCOUNTER — Other Ambulatory Visit: Payer: Self-pay

## 2019-05-19 DIAGNOSIS — Z1159 Encounter for screening for other viral diseases: Secondary | ICD-10-CM | POA: Diagnosis present

## 2019-05-20 LAB — NOVEL CORONAVIRUS, NAA (HOSP ORDER, SEND-OUT TO REF LAB; TAT 18-24 HRS): SARS-CoV-2, NAA: NOT DETECTED

## 2019-05-22 MED ORDER — SODIUM CHLORIDE 0.9 % IV SOLN
3.0000 g | INTRAVENOUS | Status: AC
  Administered 2019-05-23: 11:00:00 3 g via INTRAVENOUS
  Filled 2019-05-22: qty 3

## 2019-05-22 NOTE — Progress Notes (Signed)
SPOKE W/  _patient     SCREENING SYMPTOMS OF COVID 19:   COUGH--no  RUNNY NOSE--- no  SORE THROAT---no  NASAL CONGESTION----no  SNEEZING----no  SHORTNESS OF BREATH---no  DIFFICULTY BREATHING---no  TEMP >100.0 -----no  UNEXPLAINED BODY ACHES------no  CHILLS --------no   HEADACHES ---------no  LOSS OF SMELL/ TASTE --------no    HAVE YOU OR ANY FAMILY MEMBER TRAVELLED PAST 14 DAYS OUT OF THE   COUNTY---no STATE----no COUNTRY----no  HAVE YOU OR ANY FAMILY MEMBER BEEN EXPOSED TO ANYONE WITH COVID 19? no    

## 2019-05-23 ENCOUNTER — Ambulatory Visit (HOSPITAL_COMMUNITY)
Admission: RE | Admit: 2019-05-23 | Discharge: 2019-05-23 | Disposition: A | Discharge status: Home / Self Care | Payer: Medicare Other | Attending: Urology | Admitting: Urology

## 2019-05-23 ENCOUNTER — Ambulatory Visit (HOSPITAL_COMMUNITY): Payer: Medicare Other

## 2019-05-23 ENCOUNTER — Encounter (HOSPITAL_COMMUNITY): Payer: Self-pay | Admitting: Anesthesiology

## 2019-05-23 ENCOUNTER — Ambulatory Visit (HOSPITAL_COMMUNITY): Payer: Medicare Other | Admitting: Physician Assistant

## 2019-05-23 ENCOUNTER — Encounter (HOSPITAL_COMMUNITY)
Admission: RE | Disposition: A | Discharge status: Home / Self Care | Payer: Self-pay | Source: Home / Self Care | Attending: Urology

## 2019-05-23 DIAGNOSIS — Z794 Long term (current) use of insulin: Secondary | ICD-10-CM | POA: Diagnosis not present

## 2019-05-23 DIAGNOSIS — Z791 Long term (current) use of non-steroidal anti-inflammatories (NSAID): Secondary | ICD-10-CM | POA: Diagnosis not present

## 2019-05-23 DIAGNOSIS — K219 Gastro-esophageal reflux disease without esophagitis: Secondary | ICD-10-CM | POA: Diagnosis not present

## 2019-05-23 DIAGNOSIS — N131 Hydronephrosis with ureteral stricture, not elsewhere classified: Secondary | ICD-10-CM | POA: Insufficient documentation

## 2019-05-23 DIAGNOSIS — N340 Urethral abscess: Secondary | ICD-10-CM | POA: Insufficient documentation

## 2019-05-23 DIAGNOSIS — E7800 Pure hypercholesterolemia, unspecified: Secondary | ICD-10-CM | POA: Insufficient documentation

## 2019-05-23 DIAGNOSIS — Z79891 Long term (current) use of opiate analgesic: Secondary | ICD-10-CM | POA: Insufficient documentation

## 2019-05-23 DIAGNOSIS — E1122 Type 2 diabetes mellitus with diabetic chronic kidney disease: Secondary | ICD-10-CM | POA: Insufficient documentation

## 2019-05-23 DIAGNOSIS — N3021 Other chronic cystitis with hematuria: Secondary | ICD-10-CM | POA: Diagnosis not present

## 2019-05-23 DIAGNOSIS — F419 Anxiety disorder, unspecified: Secondary | ICD-10-CM | POA: Insufficient documentation

## 2019-05-23 DIAGNOSIS — Z79899 Other long term (current) drug therapy: Secondary | ICD-10-CM | POA: Diagnosis not present

## 2019-05-23 DIAGNOSIS — I129 Hypertensive chronic kidney disease with stage 1 through stage 4 chronic kidney disease, or unspecified chronic kidney disease: Secondary | ICD-10-CM | POA: Insufficient documentation

## 2019-05-23 DIAGNOSIS — N183 Chronic kidney disease, stage 3 (moderate): Secondary | ICD-10-CM | POA: Insufficient documentation

## 2019-05-23 DIAGNOSIS — Z87891 Personal history of nicotine dependence: Secondary | ICD-10-CM | POA: Diagnosis not present

## 2019-05-23 HISTORY — PX: CYSTOSCOPY W/ URETERAL STENT PLACEMENT: SHX1429

## 2019-05-23 LAB — BASIC METABOLIC PANEL
Anion gap: 9 (ref 5–15)
BUN: 42 mg/dL — ABNORMAL HIGH (ref 8–23)
CO2: 26 mmol/L (ref 22–32)
Calcium: 9.2 mg/dL (ref 8.9–10.3)
Chloride: 103 mmol/L (ref 98–111)
Creatinine, Ser: 1.32 mg/dL — ABNORMAL HIGH (ref 0.44–1.00)
GFR calc Af Amer: 46 mL/min — ABNORMAL LOW (ref >60)
GFR calc non Af Amer: 40 mL/min — ABNORMAL LOW (ref >60)
Glucose, Bld: 225 mg/dL — ABNORMAL HIGH (ref 70–99)
Potassium: 4.9 mmol/L (ref 3.5–5.1)
Sodium: 138 mmol/L (ref 135–145)

## 2019-05-23 LAB — GLUCOSE, CAPILLARY
Glucose-Capillary: 311 mg/dL — ABNORMAL HIGH (ref 70–99)
Glucose-Capillary: 146 mg/dL — ABNORMAL HIGH (ref 70–99)

## 2019-05-23 SURGERY — CYSTOSCOPY, FLEXIBLE, WITH STENT REPLACEMENT
Anesthesia: General | Laterality: Right

## 2019-05-23 MED ORDER — SODIUM CHLORIDE 0.9 % IV SOLN: 250.0000 mL | INTRAVENOUS | Status: DC | PRN

## 2019-05-23 MED ORDER — ONDANSETRON HCL 4 MG/2ML IJ SOLN: 4.0000 mg | Freq: Once | INTRAMUSCULAR | Status: DC | PRN

## 2019-05-23 MED ORDER — MEPERIDINE HCL 50 MG/ML IJ SOLN: 6.2500 mg | INTRAMUSCULAR | Status: DC | PRN

## 2019-05-23 MED ORDER — ACETAMINOPHEN 325 MG PO TABS: 325.0000 mg | ORAL_TABLET | ORAL | Status: DC | PRN

## 2019-05-23 MED ORDER — SODIUM CHLORIDE 0.9 % IV SOLN
INTRAVENOUS | Status: DC
  Administered 2019-05-23: 10:00:00 via INTRAVENOUS

## 2019-05-23 MED ORDER — SODIUM CHLORIDE 0.9% FLUSH: 3.0000 mL | INTRAVENOUS | Status: DC | PRN

## 2019-05-23 MED ORDER — SODIUM CHLORIDE 0.9% FLUSH: 3.0000 mL | Freq: Two times a day (BID) | INTRAVENOUS | Status: DC

## 2019-05-23 MED ORDER — LIDOCAINE HCL (CARDIAC) PF 100 MG/5ML IV SOSY
PREFILLED_SYRINGE | INTRAVENOUS | Status: DC | PRN
  Administered 2019-05-23: 100 mg via INTRAVENOUS

## 2019-05-23 MED ORDER — MORPHINE SULFATE (PF) 4 MG/ML IV SOLN: 2.0000 mg | INTRAVENOUS | Status: DC | PRN

## 2019-05-23 MED ORDER — ONDANSETRON HCL 4 MG/2ML IJ SOLN
INTRAMUSCULAR | Status: DC | PRN
  Administered 2019-05-23: 4 mg via INTRAVENOUS

## 2019-05-23 MED ORDER — FENTANYL CITRATE (PF) 100 MCG/2ML IJ SOLN
INTRAMUSCULAR | Status: AC
  Filled 2019-05-23: qty 2

## 2019-05-23 MED ORDER — SODIUM CHLORIDE 0.9 % IR SOLN
Status: DC | PRN
  Administered 2019-05-23: 3000 mL

## 2019-05-23 MED ORDER — PROPOFOL 10 MG/ML IV BOLUS
INTRAVENOUS | Status: AC
  Filled 2019-05-23: qty 40

## 2019-05-23 MED ORDER — ACETAMINOPHEN 325 MG PO TABS: 650.0000 mg | ORAL_TABLET | ORAL | Status: DC | PRN

## 2019-05-23 MED ORDER — ESTROGENS, CONJUGATED 0.625 MG/GM VA CREA: 0.5000 g | TOPICAL_CREAM | Freq: Every day | VAGINAL | 11 refills | Status: AC

## 2019-05-23 MED ORDER — PROPOFOL 10 MG/ML IV BOLUS
INTRAVENOUS | Status: DC | PRN
  Administered 2019-05-23: 130 mg via INTRAVENOUS

## 2019-05-23 MED ORDER — LIDOCAINE 2% (20 MG/ML) 5 ML SYRINGE
INTRAMUSCULAR | Status: AC
  Filled 2019-05-23: qty 5

## 2019-05-23 MED ORDER — PHENYLEPHRINE 40 MCG/ML (10ML) SYRINGE FOR IV PUSH (FOR BLOOD PRESSURE SUPPORT)
PREFILLED_SYRINGE | INTRAVENOUS | Status: AC
  Filled 2019-05-23: qty 10

## 2019-05-23 MED ORDER — ACETAMINOPHEN 160 MG/5ML PO SOLN: 325.0000 mg | ORAL | Status: DC | PRN

## 2019-05-23 MED ORDER — ACETAMINOPHEN 650 MG RE SUPP
650.0000 mg | RECTAL | Status: DC | PRN
  Filled 2019-05-23: qty 1

## 2019-05-23 MED ORDER — OXYCODONE HCL 5 MG PO TABS: 5.0000 mg | ORAL_TABLET | Freq: Once | ORAL | Status: DC | PRN

## 2019-05-23 MED ORDER — LACTATED RINGERS IV SOLN
INTRAVENOUS | Status: DC | PRN
  Administered 2019-05-23: 09:00:00 via INTRAVENOUS

## 2019-05-23 MED ORDER — FENTANYL CITRATE (PF) 100 MCG/2ML IJ SOLN
25.0000 ug | INTRAMUSCULAR | Status: DC | PRN
  Administered 2019-05-23 (×2): 50 ug via INTRAVENOUS

## 2019-05-23 MED ORDER — OXYCODONE HCL 5 MG PO TABS: 5.0000 mg | ORAL_TABLET | ORAL | Status: DC | PRN

## 2019-05-23 MED ORDER — EPHEDRINE 5 MG/ML INJ
INTRAVENOUS | Status: AC
  Filled 2019-05-23: qty 10

## 2019-05-23 MED ORDER — EPHEDRINE SULFATE 50 MG/ML IJ SOLN
INTRAMUSCULAR | Status: DC | PRN
  Administered 2019-05-23 (×3): 5 mg via INTRAVENOUS

## 2019-05-23 MED ORDER — PHENYLEPHRINE 40 MCG/ML (10ML) SYRINGE FOR IV PUSH (FOR BLOOD PRESSURE SUPPORT)
PREFILLED_SYRINGE | INTRAVENOUS | Status: DC | PRN
  Administered 2019-05-23: 80 ug via INTRAVENOUS

## 2019-05-23 MED ORDER — OXYCODONE HCL 5 MG/5ML PO SOLN: 5.0000 mg | Freq: Once | ORAL | Status: DC | PRN

## 2019-05-23 MED ORDER — FENTANYL CITRATE (PF) 100 MCG/2ML IJ SOLN
INTRAMUSCULAR | Status: DC | PRN
  Administered 2019-05-23: 25 ug via INTRAVENOUS
  Administered 2019-05-23: 50 ug via INTRAVENOUS

## 2019-05-23 SURGICAL SUPPLY — 13 items
BAG URO CATCHER STRL LF (MISCELLANEOUS) ×3 IMPLANT
CATH URET 5FR 28IN OPEN ENDED (CATHETERS) ×2 IMPLANT
CLOTH BEACON ORANGE TIMEOUT ST (SAFETY) ×3 IMPLANT
GLOVE SURG SS PI 8.0 STRL IVOR (GLOVE) ×2 IMPLANT
GOWN STRL REUS W/TWL XL LVL3 (GOWN DISPOSABLE) ×3 IMPLANT
GUIDEWIRE STR DUAL SENSOR (WIRE) ×3 IMPLANT
KIT TURNOVER KIT A (KITS) ×2 IMPLANT
MANIFOLD NEPTUNE II (INSTRUMENTS) ×3 IMPLANT
PACK CYSTO (CUSTOM PROCEDURE TRAY) ×3 IMPLANT
STENT URET 6FRX24 CONTOUR (STENTS) ×2 IMPLANT
TUBING CONNECTING 10 (TUBING) ×2 IMPLANT
TUBING CONNECTING 10' (TUBING) ×1
TUBING UROLOGY SET (TUBING) ×2 IMPLANT

## 2019-05-23 NOTE — Anesthesia Postprocedure Evaluation (Signed)
Anesthesia Post Note  Patient: Valerie [name]  Procedure(s) Performed: CYSTOSCOPY WITH STENT EXCHANGE (Right )     Patient location during evaluation: PACU Anesthesia Type: General Level of consciousness: awake and alert Pain management: pain level controlled Vital Signs Assessment: post-procedure vital signs reviewed and stable Respiratory status: spontaneous breathing, nonlabored ventilation, respiratory function stable and patient connected to nasal cannula oxygen Cardiovascular status: blood pressure returned to baseline and stable Postop Assessment: no apparent nausea or vomiting Anesthetic complications: no    Last Vitals:  Vitals:   05/23/19 0845 05/23/19 1100  BP: (!) 148/87 99/63  Pulse: 87 64  Resp: 20 15  Temp: 37.1 C 36.5 C  SpO2: 94% 100%    Last Pain:  Vitals:   05/23/19 1100  TempSrc:   PainSc: 0-No pain                 Adelia Baptista

## 2019-05-23 NOTE — Op Note (Signed)
Procedure: Cystoscopy with removal and replacement of right double-J stent.  Preop diagnosis: Chronic right ureteral obstruction.  Postop diagnosis: 1.  Chronic right ureteral obstruction. 2.  Urethral carbuncle.  Surgeon: Dr. Bjorn Pippin.  Anesthesia: General.  Drains: 6 French by 24 cm contour double-J stent.  Specimen: None.  EBL: None.  Complications: None.  Indications: Valerie [name] is a 73 year old white female with a history of chronic right ureteral obstruction managed with every 3 month stent changes.  She returns now for stent change.  She does report some hematuria as well.  Procedure: She was taken operating room where she was given Unasyn based on her preoperative culture.  A general anesthetic was induced.  She was placed in lithotomy position and fitted with PAS hose.  Her perineum and genitalia were prepped with Betadine solution.  Cystoscopy was performed using a 23 [place] scope and 30 degree lens.  Examination revealed some blood at the introitus and evidence of a hemorrhagic carbuncle at the urethral meatus.  Urethra was otherwise normal.  The bladder urine was cloudy but once irrigated clear mucosa was unremarkable with exception of some squamous metaplasia of the trigone.  The left ureteral orifice was unremarkable.  The right ureteral orifice had a stent with minimal encrustation or peri-meatal edema.  Ureteral stent was grasped with a grasping forceps and pulled to the urethral meatus.  A sensor wire was then passed through the stent to the kidney under fluoroscopic guidance.  The stent was removed and the cystoscope was reinserted over the wire.  A fresh 6 French by 24 cm contour double-J stent was advanced to the kidney under fluoroscopic guidance.  The wire was removed, leaving a good coil in the kidney and a good coil in the bladder.  The bladder was drained.  She was taken out of lithotomy position, her anesthetic was reversed and she was moved recovery in stable  condition.  There were no complications.

## 2019-05-23 NOTE — H&P (Signed)
CC/HPI: Valerie [name] was contacted for a telephone visit today after we were unable to connect with video. She has quite an extensive medical history with chronic comorbidities. 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 This is managed with suppressive keflex as well as ureteral stent which gets exchanged every three months. Her last stent exchange was performed on 02/09/19. She remains on Myrbetriq 50mg  for her OAB symptoms. She dropped a urine a few days ago and was found to have b. strep on culture and has been started on amoxicillin. She has ongoing but intermittent right flank pain that is mildly severe. She denies recent fevers. She is voiding well without complaints.     ALLERGIES: None   MEDICATIONS: Keflex 500 mg capsule 1 capsule PO Daily  Alprazolam 0.25 mg tablet Oral  Amoxicillin  Ampicillin Trihydrate 500 mg capsule 1 capsule PO QID  Celecoxib  Escitalopram Oxalate 10 mg tablet  Fish Oil CAPS Oral  Flonase 50 mcg/actuation spray, suspension Nasal  Furosemide 20 mg tablet Oral  Humulin R 100 unit/ml vial Injection  Lipitor 10 mg tablet Oral  Mucinex 600 mg tablet, extended release 12 hr Oral  Myrbetriq 50 mg tablet, extended release 24 hr Oral  Ondansetron Odt 4 mg tablet,disintegrating Oral  Oxycontin  Pantoprazole Sodium 40 mg tablet, delayed release Oral  Tizanidine Hcl 2 mg tablet Oral     Notes: She has increased the oxymorphone for her back.   GU PSH: Cysto Uretero Biopsy Fulgura - 2017 Cystoscopy Insert Stent, Right - 02/09/2019, Right - 11/29/2018, Right - 08/11/2018, Right - 03/22/2018, Right - 10/28/2017, Right - 07/01/2017, Right - 2018, 2017, 2017 Cystoscopy Ureteroscopy, Right - 2018, 2017 Locm 300-399Mg /Ml Iodine,1Ml - 2018 Rpr Umbil Hern; Reduc < 5 Yr - 2017      PSH Notes: Epidural injections 2017   NON-GU PSH: Laparoscopy, Surgical; Repair Umbilical Hernia Pancreas Surgery  (Unspecified) - 2017 Small bowel resection - 2017    GU PMH: RLQ pain, She has some increased pain in the RLQ. I will move up her stent exchange to see if that will help. - 01/30/2019 Chronic kidney disease stage 3 (GFR 30-60) (Stable), She had a lot of questions about her kidney dysfunction. She has CKD3. I have recommended she not take Celebrex but also suggested she bring this up with Dr. Manus Gunning. - 11/10/2018 Hydronephrosis Unspec (Worsening), She is overdue for a stent exchange and has some increased pain. I will get a urine culture and get her set up for the stent exchange next week. - 03/16/2018, She is due for a stent change in the next 4-6 weeks. I will get that set up. , - 09/15/2017, She is doing well post stent exchanged. I will have her return in 3 months to arrange the next exchange. , - 07/14/2017 (Stable), Right, - 2017 (Improving), There is minimal residual right hydro noted on [place]. , - 2017, Hydronephrosis, right, - 2017 Urinary Urgency, Her frequency and urgency are well controlled on Myrbetriq. - 07/14/2017 Gross hematuria, She has gross hematuria that appears to be originating in the right kidney and she has chronic inflammation and obstruction of that kidney, I am going to get a cytology today and will get her set up for ureteroscopy and stenting to better evaluate the collecting system. With CKD3 I think it would be best if she is able to avoid right nephrectomy. I have reviewed the risks of the procedure in detail including  bleeding, infection, ureteral injury, need for a stent and secondary procedures, thrombotic events and anesthetic complications. - 2018, (Acute), Culture urine. Will continue with daily suppression Cephalexin until culture finalized. If she has UTI now resistant to Cephalexin will change suppression. Will proceed with hematuria workup with CT and possible cysto , - 2018, Gross hematuria, - 2017 Proteinuria (Acute), With new onset Proteinuria in pt with diabetes and  hypertension may need nephrology referral. Will check BUN/creatine today - 2018 Chronic cystitis (w/o hematuria) (Stable), Her UA looks infected but she just came of our antibiotic and is on amoxicillin. She is free of symptoms. - 2017, Chronic cystitis, - 2017 Urinary Tract Inf, Unspec site, Pyuria - 2017 Incomplete bladder emptying, Feeling of incomplete bladder emptying - 2017 Postmenopausal atrophic vaginitis, Atrophic vaginitis - 2017    NON-GU PMH: Anxiety, Anxiety - 2017 Encounter for general adult medical examination without abnormal findings, Encounter for preventive health examination - 2017 Personal history of other diseases of the circulatory system, History of hypertension - 2017 Personal history of other diseases of the digestive system, History of small bowel obstruction - 2017, History of pancreatitis, - 2017, History of esophageal reflux, - 2017 Personal history of other diseases of the musculoskeletal system and connective tissue, History of arthritis - 2017 Personal history of other endocrine, nutritional and metabolic disease, History of diabetes mellitus - 2017, History of hypercholesterolemia, - 2017    FAMILY HISTORY: Death of family member - Runs In Family Prostate Cancer - Runs In Family   SOCIAL HISTORY: Marital Status: Married Preferred Language: English; Ethnicity: Not Hispanic Or Latino; Race: White Current Smoking Status: Patient does not smoke anymore.  Does not use smokeless tobacco. Has never drank.  Does not use drugs. Drinks 1 caffeinated drink per day.    REVIEW OF SYSTEMS:    GU Review Female:   Patient denies hard to postpone urination, being pregnant, trouble starting your stream, burning /pain with urination, have to strain to urinate, frequent urination, get up at night to urinate, leakage of urine, and stream starts and stops.  Gastrointestinal (Upper):   Patient denies nausea, vomiting, and indigestion/ heartburn.  Gastrointestinal (Lower):    Patient denies diarrhea and constipation.  Constitutional:   Patient denies fever, night sweats, weight loss, and fatigue.  Skin:   Patient denies skin rash/ lesion and itching.  Eyes:   Patient denies blurred vision and double vision.  Ears/ Nose/ Throat:   Patient denies sore throat and sinus problems.  Hematologic/Lymphatic:   Patient denies swollen glands and easy bruising.  Cardiovascular:   Patient denies leg swelling and chest pains.  Respiratory:   Patient denies cough and shortness of breath.  Endocrine:   Patient denies excessive thirst.  Musculoskeletal:   Patient denies back pain and joint pain.  Neurological:   Patient denies headaches and dizziness.  Psychologic:   Patient denies depression and anxiety.   PAST DATA REVIEWED:  Source Of History:  Patient  Urine Test Review:   Urinalysis, Urine Culture   PROCEDURES:          Teleheatlh This patient encounter is appropriate and reasonable under the circumstances given the patient's particular presentation at this time. The patient has been advised of the potential risks and limitations of this mode of treatment (including, but not limited to, the absence of in-person examination) and has agreed to be treated in a remote fashion in spite of them.   Any and all of the patient's/patient's family's questions on this issue  have been answered, and I have made no promises or guarantees to the patient. The patient has also been advised to contact this office for worsening conditions or problems, and seek emergency medical treatment and/or call 911 if the patient deems either necessary.    ASSESSMENT:      ICD-10 Details  1 GU:   Chronic cystitis (w/o hematuria) - N30.20 She will continue the ampicillin till complete and then resume keflex suppression  2   Hydronephrosis Unspec - N13.30 I will arrange a stent exchange in the next 2 weeks.      PLAN:           Schedule Return Visit/Planned Activity: ASAP - Schedule Surgery              Note: Cystoscopy with right stent exchange.          Document Letter(s):  Created for Patient: Clinical Summary         Notes:   Time on phone 10 minutes with 5 minutes of visit prep and post contact order entry.   CC: Dr. Gaynelle Arabian.         Next Appointment:      Next Appointment: 05/23/2019 10:30 AM    Appointment Type: Surgery     Location: [facility] [phone]    Provider: [name], M.D.    Reason for Visit: OP [facility] CYSTO STENT CHANGE

## 2019-05-23 NOTE — Anesthesia Procedure Notes (Signed)
Procedure Name: LMA Insertion Date/Time: 05/23/2019 10:29 AM Performed by: Theodosia Quay, CRNA Pre-anesthesia Checklist: Patient identified, Emergency Drugs available, Suction available and Patient being monitored Patient Re-evaluated:Patient Re-evaluated prior to induction Oxygen Delivery Method: Circle system utilized Preoxygenation: Pre-oxygenation with 100% oxygen Induction Type: IV induction LMA: LMA with gastric port inserted LMA Size: 4.0 Dental Injury: Teeth and Oropharynx as per pre-operative assessment

## 2019-05-23 NOTE — Transfer of Care (Signed)
Immediate Anesthesia Transfer of Care Note  Patient: Valerie [name]  Procedure(s) Performed: CYSTOSCOPY WITH STENT EXCHANGE (Right )  Patient Location: PACU  Anesthesia Type:General  Level of Consciousness: awake, alert  and oriented  Airway & Oxygen Therapy: Patient Spontanous Breathing and Patient connected to face mask oxygen  Post-op Assessment: Report given to RN and Post -op Vital signs reviewed and stable  Post vital signs: Reviewed and stable  Last Vitals:  Vitals Value Taken Time  BP    Temp    Pulse 63 05/23/2019 11:01 AM  Resp 10 05/23/2019 11:01 AM  SpO2 100 % 05/23/2019 11:01 AM  Vitals shown include unvalidated device data.  Last Pain:  Vitals:   05/23/19 0959  TempSrc:   PainSc: 0-No pain      Patients Stated Pain Goal: 7 (05/23/19 0959)  Complications: No apparent anesthesia complications

## 2019-05-23 NOTE — Discharge Instructions (Signed)
Ureteral Stent Implantation, Care After Refer to this sheet in the next few weeks. These instructions provide you with information about caring for yourself after your procedure. Your health care provider may also give you more specific instructions. Your treatment has been planned according to current medical practices, but problems sometimes occur. Call your health care provider if you have any problems or questions after your procedure. What can I expect after the procedure? After the procedure, it is common to have:  Nausea.  Mild pain when you urinate. You may feel this pain in your lower back or lower abdomen. Pain should stop within a few minutes after you urinate. This may last for up to 1 week.  A small amount of blood in your urine for several days. Follow these instructions at home:  Medicines  Take over-the-counter and prescription medicines only as told by your health care provider.  If you were prescribed an antibiotic medicine, take it as told by your health care provider. Do not stop taking the antibiotic even if you start to feel better.  Do not drive for 24 hours if you received a sedative.  Do not drive or operate heavy machinery while taking prescription pain medicines. Activity  Return to your normal activities as told by your health care provider. Ask your health care provider what activities are safe for you.  Do not lift anything that is heavier than 10 lb (4.5 kg). Follow this limit for 1 week after your procedure, or for as long as told by your health care provider. General instructions  Watch for any blood in your urine. Call your health care provider if the amount of blood in your urine increases.  If you have a catheter: ? Follow instructions from your health care provider about taking care of your catheter and collection bag. ? Do not take baths, swim, or use a hot tub until your health care provider approves.  Drink enough fluid to keep your urine  clear or pale yellow.  Keep all follow-up visits as told by your health care provider. This is important. Contact a health care provider if:  You have pain that gets worse or does not get better with medicine, especially pain when you urinate.  You have difficulty urinating.  You feel nauseous or you vomit repeatedly during a period of more than 2 days after the procedure. Get help right away if:  Your urine is dark red or has blood clots in it.  You are leaking urine (have incontinence).  The end of the stent comes out of your urethra.  You cannot urinate.  You have sudden, sharp, or severe pain in your abdomen or lower back.  You have a fever.  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  That is best managed by applying estrogen cream.  I have written a prescription for this medication and you just apply (0.5gm) a pea sized amount to the urethral area nightly for 2 weeks and then 2-3x weekly.   The medication is not inexpensive as a rule but one tube should last for 2-3 months if used sparingly.  Side effects are minimal.   Occasionally there can be some vaginal irritation and also transient breast tenderness.    This information is not intended to replace advice given to you by your health care provider. Make sure you discuss any questions you  have with your health care provider. Document Released: 08/16/2013 Document Revised: 05/21/2016 Document Reviewed: 06/28/2015 Elsevier Interactive Patient Education  2019 [employer]

## 2019-05-24 ENCOUNTER — Encounter (HOSPITAL_COMMUNITY): Payer: Self-pay | Admitting: Urology

## 2019-08-04 ENCOUNTER — Other Ambulatory Visit: Payer: Self-pay | Admitting: Urology

## 2019-08-14 ENCOUNTER — Other Ambulatory Visit (HOSPITAL_COMMUNITY)
Admission: RE | Admit: 2019-08-14 | Discharge: 2019-08-14 | Disposition: A | Discharge status: Home / Self Care | Payer: Medicare Other | Source: Ambulatory Visit | Attending: Urology | Admitting: Urology

## 2019-08-14 DIAGNOSIS — Z20828 Contact with and (suspected) exposure to other viral communicable diseases: Secondary | ICD-10-CM | POA: Diagnosis not present

## 2019-08-14 DIAGNOSIS — Z01812 Encounter for preprocedural laboratory examination: Secondary | ICD-10-CM | POA: Insufficient documentation

## 2019-08-14 LAB — SARS CORONAVIRUS 2 (TAT 6-24 HRS): SARS Coronavirus 2: NEGATIVE

## 2019-08-15 ENCOUNTER — Encounter (HOSPITAL_BASED_OUTPATIENT_CLINIC_OR_DEPARTMENT_OTHER): Payer: Self-pay | Admitting: *Deleted

## 2019-08-15 ENCOUNTER — Other Ambulatory Visit: Payer: Self-pay

## 2019-08-15 NOTE — Progress Notes (Addendum)
Spoke w/ Valerie [name] via phone for pre-op interview.  Npo after mn.  Arrive at 0900.  Needs istat 8 and ekg.  Valerie [name] had covid test done 08-14-2019.  Will take oxycontin, protonix, and myrbetriq am dos w/ sips of water.  Valerie [name]'s endocrinologist lov note dated 06-29-2019 , dr altheimer, in chart and epic.  Valerie [name] aware no insulin morning of surgery.

## 2019-08-16 NOTE — H&P (Signed)
CC/HPI: Valerie [name] was contacted for a telephone visit today after we were unable to connect with video. She has quite an extensive medical history with chronic comorbidities. 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 This is managed with suppressive keflex as well as ureteral stent which gets exchanged every three months. Her last stent exchange was performed on 02/09/19. She remains on Myrbetriq 50mg  for her OAB symptoms. She dropped a urine a few days ago and was found to have b. strep on culture and has been started on amoxicillin. She has ongoing but intermittent right flank pain that is mildly severe. She denies recent fevers. She is voiding well without complaints.     ALLERGIES: None   MEDICATIONS: Keflex 500 mg capsule 1 capsule PO Daily  Alprazolam 0.25 mg tablet Oral  Amoxicillin  Ampicillin Trihydrate 500 mg capsule 1 capsule PO QID  Celecoxib  Escitalopram Oxalate 10 mg tablet  Fish Oil CAPS Oral  Flonase 50 mcg/actuation spray, suspension Nasal  Furosemide 20 mg tablet Oral  Humulin R 100 unit/ml vial Injection  Lipitor 10 mg tablet Oral  Mucinex 600 mg tablet, extended release 12 hr Oral  Myrbetriq 50 mg tablet, extended release 24 hr Oral  Ondansetron Odt 4 mg tablet,disintegrating Oral  Oxycontin  Pantoprazole Sodium 40 mg tablet, delayed release Oral  Tizanidine Hcl 2 mg tablet Oral     Notes: She has increased the oxymorphone for her back.   GU PSH: Cysto Uretero Biopsy Fulgura - 2017 Cystoscopy Insert Stent, Right - 02/09/2019, Right - 11/29/2018, Right - 08/11/2018, Right - 03/22/2018, Right - 10/28/2017, Right - 07/01/2017, Right - 2018, 2017, 2017 Cystoscopy Ureteroscopy, Right - 2018, 2017 Locm 300-399Mg /Ml Iodine,1Ml - 2018 Rpr Umbil Hern; Reduc < 5 Yr - 2017      PSH Notes: Epidural injections 2017   NON-GU PSH: Laparoscopy, Surgical; Repair Umbilical Hernia Pancreas Surgery  (Unspecified) - 2017 Small bowel resection - 2017    GU PMH: RLQ pain, She has some increased pain in the RLQ. I will move up her stent exchange to see if that will help. - 01/30/2019 Chronic kidney disease stage 3 (GFR 30-60) (Stable), She had a lot of questions about her kidney dysfunction. She has CKD3. I have recommended she not take Celebrex but also suggested she bring this up with Dr. Manus Gunning. - 11/10/2018 Hydronephrosis Unspec (Worsening), She is overdue for a stent exchange and has some increased pain. I will get a urine culture and get her set up for the stent exchange next week. - 03/16/2018, She is due for a stent change in the next 4-6 weeks. I will get that set up. , - 09/15/2017, She is doing well post stent exchanged. I will have her return in 3 months to arrange the next exchange. , - 07/14/2017 (Stable), Right, - 2017 (Improving), There is minimal residual right hydro noted on [place]. , - 2017, Hydronephrosis, right, - 2017 Urinary Urgency, Her frequency and urgency are well controlled on Myrbetriq. - 07/14/2017 Gross hematuria, She has gross hematuria that appears to be originating in the right kidney and she has chronic inflammation and obstruction of that kidney, I am going to get a cytology today and will get her set up for ureteroscopy and stenting to better evaluate the collecting system. With CKD3 I think it would be best if she is able to avoid right nephrectomy. I have reviewed the risks of the procedure in detail including  bleeding, infection, ureteral injury, need for a stent and secondary procedures, thrombotic events and anesthetic complications. - 2018, (Acute), Culture urine. Will continue with daily suppression Cephalexin until culture finalized. If she has UTI now resistant to Cephalexin will change suppression. Will proceed with hematuria workup with CT and possible cysto , - 2018, Gross hematuria, - 2017 Proteinuria (Acute), With new onset Proteinuria in pt with diabetes and  hypertension may need nephrology referral. Will check BUN/creatine today - 2018 Chronic cystitis (w/o hematuria) (Stable), Her UA looks infected but she just came of our antibiotic and is on amoxicillin. She is free of symptoms. - 2017, Chronic cystitis, - 2017 Urinary Tract Inf, Unspec site, Pyuria - 2017 Incomplete bladder emptying, Feeling of incomplete bladder emptying - 2017 Postmenopausal atrophic vaginitis, Atrophic vaginitis - 2017    NON-GU PMH: Anxiety, Anxiety - 2017 Encounter for general adult medical examination without abnormal findings, Encounter for preventive health examination - 2017 Personal history of other diseases of the circulatory system, History of hypertension - 2017 Personal history of other diseases of the digestive system, History of small bowel obstruction - 2017, History of pancreatitis, - 2017, History of esophageal reflux, - 2017 Personal history of other diseases of the musculoskeletal system and connective tissue, History of arthritis - 2017 Personal history of other endocrine, nutritional and metabolic disease, History of diabetes mellitus - 2017, History of hypercholesterolemia, - 2017    FAMILY HISTORY: Death of family member - Runs In Family Prostate Cancer - Runs In Family   SOCIAL HISTORY: Marital Status: Married Preferred Language: English; Ethnicity: Not Hispanic Or Latino; Race: White Current Smoking Status: Patient does not smoke anymore.  Does not use smokeless tobacco. Has never drank.  Does not use drugs. Drinks 1 caffeinated drink per day.    REVIEW OF SYSTEMS:    GU Review Female:   Patient denies hard to postpone urination, being pregnant, trouble starting your stream, burning /pain with urination, have to strain to urinate, frequent urination, get up at night to urinate, leakage of urine, and stream starts and stops.  Gastrointestinal (Upper):   Patient denies nausea, vomiting, and indigestion/ heartburn.  Gastrointestinal (Lower):    Patient denies diarrhea and constipation.  Constitutional:   Patient denies fever, night sweats, weight loss, and fatigue.  Skin:   Patient denies skin rash/ lesion and itching.  Eyes:   Patient denies blurred vision and double vision.  Ears/ Nose/ Throat:   Patient denies sore throat and sinus problems.  Hematologic/Lymphatic:   Patient denies swollen glands and easy bruising.  Cardiovascular:   Patient denies leg swelling and chest pains.  Respiratory:   Patient denies cough and shortness of breath.  Endocrine:   Patient denies excessive thirst.  Musculoskeletal:   Patient denies back pain and joint pain.  Neurological:   Patient denies headaches and dizziness.  Psychologic:   Patient denies depression and anxiety.   PAST DATA REVIEWED:  Source Of History:  Patient  Urine Test Review:   Urinalysis, Urine Culture   PROCEDURES:          Teleheatlh This patient encounter is appropriate and reasonable under the circumstances given the patient's particular presentation at this time. The patient has been advised of the potential risks and limitations of this mode of treatment (including, but not limited to, the absence of in-person examination) and has agreed to be treated in a remote fashion in spite of them.   Any and all of the patient's/patient's family's questions on this issue   have been answered, and I have made no promises or guarantees to the patient. The patient has also been advised to contact this office for worsening conditions or problems, and seek emergency medical treatment and/or call 911 if the patient deems either necessary.    ASSESSMENT:      ICD-10 Details  1 GU:   Chronic cystitis (w/o hematuria) - N30.20 She will continue the ampicillin till complete and then resume keflex suppression  2   Hydronephrosis Unspec - N13.30 I will arrange a stent exchange in the next 2 weeks.      PLAN:           Schedule Return Visit/Planned Activity: ASAP - Schedule Surgery              Note: Cystoscopy with right stent exchange.          Document Letter(s):  Created for Patient: Clinical Summary         Notes:   Time on phone 10 minutes with 5 minutes of visit prep and post contact order entry.   CC: Dr. Gaynelle Arabian.         Next Appointment:      Next Appointment: 05/23/2019 10:30 AM    Appointment Type: Surgery     Location: [facility] [phone]    Provider: [name], M.D.    Reason for Visit: OP WL CYSTO STENT CHANGE

## 2019-08-17 ENCOUNTER — Ambulatory Visit (HOSPITAL_BASED_OUTPATIENT_CLINIC_OR_DEPARTMENT_OTHER)
Admission: RE | Admit: 2019-08-17 | Discharge: 2019-08-17 | Disposition: A | Discharge status: Home / Self Care | Payer: Medicare Other | Attending: Urology | Admitting: Urology

## 2019-08-17 ENCOUNTER — Other Ambulatory Visit: Payer: Self-pay

## 2019-08-17 ENCOUNTER — Encounter (HOSPITAL_BASED_OUTPATIENT_CLINIC_OR_DEPARTMENT_OTHER): Payer: Self-pay | Admitting: Certified Registered Nurse Anesthetist

## 2019-08-17 ENCOUNTER — Encounter (HOSPITAL_BASED_OUTPATIENT_CLINIC_OR_DEPARTMENT_OTHER)
Admission: RE | Disposition: A | Discharge status: Home / Self Care | Payer: Self-pay | Source: Home / Self Care | Attending: Urology

## 2019-08-17 ENCOUNTER — Ambulatory Visit (HOSPITAL_BASED_OUTPATIENT_CLINIC_OR_DEPARTMENT_OTHER): Payer: Medicare Other | Admitting: Physician Assistant

## 2019-08-17 DIAGNOSIS — Z87891 Personal history of nicotine dependence: Secondary | ICD-10-CM | POA: Insufficient documentation

## 2019-08-17 DIAGNOSIS — N131 Hydronephrosis with ureteral stricture, not elsewhere classified: Secondary | ICD-10-CM | POA: Diagnosis not present

## 2019-08-17 DIAGNOSIS — Z6841 Body Mass Index (BMI) 40.0 and over, adult: Secondary | ICD-10-CM | POA: Insufficient documentation

## 2019-08-17 DIAGNOSIS — Z79891 Long term (current) use of opiate analgesic: Secondary | ICD-10-CM | POA: Insufficient documentation

## 2019-08-17 DIAGNOSIS — I129 Hypertensive chronic kidney disease with stage 1 through stage 4 chronic kidney disease, or unspecified chronic kidney disease: Secondary | ICD-10-CM | POA: Diagnosis not present

## 2019-08-17 DIAGNOSIS — Z794 Long term (current) use of insulin: Secondary | ICD-10-CM | POA: Insufficient documentation

## 2019-08-17 DIAGNOSIS — E7800 Pure hypercholesterolemia, unspecified: Secondary | ICD-10-CM | POA: Insufficient documentation

## 2019-08-17 DIAGNOSIS — E1122 Type 2 diabetes mellitus with diabetic chronic kidney disease: Secondary | ICD-10-CM | POA: Diagnosis not present

## 2019-08-17 DIAGNOSIS — Z79899 Other long term (current) drug therapy: Secondary | ICD-10-CM | POA: Insufficient documentation

## 2019-08-17 DIAGNOSIS — J449 Chronic obstructive pulmonary disease, unspecified: Secondary | ICD-10-CM | POA: Insufficient documentation

## 2019-08-17 DIAGNOSIS — N183 Chronic kidney disease, stage 3 (moderate): Secondary | ICD-10-CM | POA: Diagnosis not present

## 2019-08-17 DIAGNOSIS — K219 Gastro-esophageal reflux disease without esophagitis: Secondary | ICD-10-CM | POA: Diagnosis not present

## 2019-08-17 DIAGNOSIS — F419 Anxiety disorder, unspecified: Secondary | ICD-10-CM | POA: Diagnosis not present

## 2019-08-17 DIAGNOSIS — N3020 Other chronic cystitis without hematuria: Secondary | ICD-10-CM | POA: Insufficient documentation

## 2019-08-17 DIAGNOSIS — Z791 Long term (current) use of non-steroidal anti-inflammatories (NSAID): Secondary | ICD-10-CM | POA: Insufficient documentation

## 2019-08-17 HISTORY — PX: CYSTOSCOPY W/ URETERAL STENT PLACEMENT: SHX1429

## 2019-08-17 HISTORY — DX: Personal history of other specified conditions: Z87.898

## 2019-08-17 LAB — POCT I-STAT, CHEM 8
BUN: 33 mg/dL — ABNORMAL HIGH (ref 8–23)
Calcium, Ion: 1.22 mmol/L (ref 1.15–1.40)
Chloride: 101 mmol/L (ref 98–111)
Creatinine, Ser: 1.2 mg/dL — ABNORMAL HIGH (ref 0.44–1.00)
Glucose, Bld: 82 mg/dL (ref 70–99)
HCT: 40 % (ref 36.0–46.0)
Hemoglobin: 13.6 g/dL (ref 12.0–15.0)
Potassium: 4.4 mmol/L (ref 3.5–5.1)
Sodium: 143 mmol/L (ref 135–145)
TCO2: 24 mmol/L (ref 22–32)

## 2019-08-17 LAB — GLUCOSE, CAPILLARY: Glucose-Capillary: 114 mg/dL — ABNORMAL HIGH (ref 70–99)

## 2019-08-17 SURGERY — CYSTOSCOPY, FLEXIBLE, WITH STENT REPLACEMENT
Anesthesia: General | Laterality: Right

## 2019-08-17 MED ORDER — SODIUM CHLORIDE 0.9 % IV SOLN
INTRAVENOUS | Status: DC
  Administered 2019-08-17: 10:00:00 via INTRAVENOUS
  Filled 2019-08-17: qty 1000

## 2019-08-17 MED ORDER — MORPHINE SULFATE (PF) 2 MG/ML IV SOLN
2.0000 mg | INTRAVENOUS | Status: DC | PRN
  Filled 2019-08-17: qty 1

## 2019-08-17 MED ORDER — LIDOCAINE HCL (CARDIAC) PF 100 MG/5ML IV SOSY
PREFILLED_SYRINGE | INTRAVENOUS | Status: DC | PRN
  Administered 2019-08-17: 100 mg via INTRAVENOUS

## 2019-08-17 MED ORDER — ONDANSETRON HCL 4 MG/2ML IJ SOLN
INTRAMUSCULAR | Status: AC
  Filled 2019-08-17: qty 2

## 2019-08-17 MED ORDER — PROPOFOL 10 MG/ML IV BOLUS
INTRAVENOUS | Status: DC | PRN
  Administered 2019-08-17: 120 mg via INTRAVENOUS
  Administered 2019-08-17: 20 mg via INTRAVENOUS

## 2019-08-17 MED ORDER — DEXAMETHASONE SODIUM PHOSPHATE 10 MG/ML IJ SOLN
INTRAMUSCULAR | Status: DC | PRN
  Administered 2019-08-17: 5 mg via INTRAVENOUS

## 2019-08-17 MED ORDER — ONDANSETRON HCL 4 MG/2ML IJ SOLN
INTRAMUSCULAR | Status: DC | PRN
  Administered 2019-08-17: 4 mg via INTRAVENOUS

## 2019-08-17 MED ORDER — OXYCODONE HCL 5 MG PO TABS
5.0000 mg | ORAL_TABLET | ORAL | Status: DC | PRN
  Filled 2019-08-17: qty 2

## 2019-08-17 MED ORDER — ACETAMINOPHEN 650 MG RE SUPP
650.0000 mg | RECTAL | Status: DC | PRN
  Filled 2019-08-17: qty 1

## 2019-08-17 MED ORDER — FENTANYL CITRATE (PF) 100 MCG/2ML IJ SOLN
INTRAMUSCULAR | Status: DC | PRN
  Administered 2019-08-17: 50 ug via INTRAVENOUS

## 2019-08-17 MED ORDER — FENTANYL CITRATE (PF) 100 MCG/2ML IJ SOLN
INTRAMUSCULAR | Status: AC
  Filled 2019-08-17: qty 2

## 2019-08-17 MED ORDER — CEFAZOLIN SODIUM-DEXTROSE 2-4 GM/100ML-% IV SOLN
2.0000 g | INTRAVENOUS | Status: AC
  Administered 2019-08-17: 2 g via INTRAVENOUS
  Filled 2019-08-17: qty 100

## 2019-08-17 MED ORDER — CEFAZOLIN SODIUM-DEXTROSE 2-4 GM/100ML-% IV SOLN
INTRAVENOUS | Status: AC
  Filled 2019-08-17: qty 100

## 2019-08-17 MED ORDER — DEXAMETHASONE SODIUM PHOSPHATE 10 MG/ML IJ SOLN
INTRAMUSCULAR | Status: AC
  Filled 2019-08-17: qty 1

## 2019-08-17 MED ORDER — ACETAMINOPHEN 325 MG PO TABS
650.0000 mg | ORAL_TABLET | ORAL | Status: DC | PRN
  Filled 2019-08-17: qty 2

## 2019-08-17 MED ORDER — PHENYLEPHRINE 40 MCG/ML (10ML) SYRINGE FOR IV PUSH (FOR BLOOD PRESSURE SUPPORT)
PREFILLED_SYRINGE | INTRAVENOUS | Status: DC | PRN
  Administered 2019-08-17: 80 ug via INTRAVENOUS

## 2019-08-17 MED ORDER — SODIUM CHLORIDE 0.9% FLUSH
3.0000 mL | INTRAVENOUS | Status: DC | PRN
  Filled 2019-08-17: qty 3

## 2019-08-17 MED ORDER — FENTANYL CITRATE (PF) 100 MCG/2ML IJ SOLN
25.0000 ug | INTRAMUSCULAR | Status: DC | PRN
  Filled 2019-08-17: qty 1

## 2019-08-17 MED ORDER — SODIUM CHLORIDE 0.9 % IV SOLN
250.0000 mL | INTRAVENOUS | Status: DC | PRN
  Filled 2019-08-17: qty 250

## 2019-08-17 MED ORDER — PROPOFOL 10 MG/ML IV BOLUS
INTRAVENOUS | Status: AC
  Filled 2019-08-17: qty 40

## 2019-08-17 MED ORDER — SODIUM CHLORIDE 0.9 % IR SOLN
Status: DC | PRN
  Administered 2019-08-17: 3000 mL via INTRAVESICAL

## 2019-08-17 MED ORDER — LIDOCAINE 2% (20 MG/ML) 5 ML SYRINGE
INTRAMUSCULAR | Status: AC
  Filled 2019-08-17: qty 5

## 2019-08-17 MED ORDER — SODIUM CHLORIDE 0.9% FLUSH
3.0000 mL | Freq: Two times a day (BID) | INTRAVENOUS | Status: DC
  Filled 2019-08-17: qty 3

## 2019-08-17 SURGICAL SUPPLY — 24 items
BAG DRAIN URO-CYSTO SKYTR STRL (DRAIN) ×3 IMPLANT
BASKET STONE 1.7 NGAGE (UROLOGICAL SUPPLIES) IMPLANT
BASKET ZERO TIP NITINOL 2.4FR (BASKET) IMPLANT
CATH URET 5FR 28IN CONE TIP (BALLOONS)
CATH URET 5FR 28IN OPEN ENDED (CATHETERS) IMPLANT
CATH URET 5FR 70CM CONE TIP (BALLOONS) IMPLANT
CLOTH BEACON ORANGE TIMEOUT ST (SAFETY) ×3 IMPLANT
ELECT REM PT RETURN 9FT ADLT (ELECTROSURGICAL)
ELECTRODE REM PT RTRN 9FT ADLT (ELECTROSURGICAL) IMPLANT
FIBER LASER FLEXIVA 365 (UROLOGICAL SUPPLIES) IMPLANT
FIBER LASER TRAC TIP (UROLOGICAL SUPPLIES) IMPLANT
GLOVE SURG SS PI 8.0 STRL IVOR (GLOVE) ×3 IMPLANT
GOWN STRL REUS W/TWL XL LVL3 (GOWN DISPOSABLE) ×3 IMPLANT
GUIDEWIRE ANG ZIPWIRE 038X150 (WIRE) IMPLANT
GUIDEWIRE STR DUAL SENSOR (WIRE) ×3 IMPLANT
IV NS IRRIG 3000ML ARTHROMATIC (IV SOLUTION) ×3 IMPLANT
KIT TURNOVER CYSTO (KITS) ×3 IMPLANT
MANIFOLD NEPTUNE II (INSTRUMENTS) ×3 IMPLANT
NS IRRIG 500ML POUR BTL (IV SOLUTION) ×3 IMPLANT
PACK CYSTO (CUSTOM PROCEDURE TRAY) ×3 IMPLANT
STENT URET 6FRX24 CONTOUR (STENTS) ×2 IMPLANT
TUBE CONNECTING 12'X1/4 (SUCTIONS) ×1
TUBE CONNECTING 12X1/4 (SUCTIONS) ×2 IMPLANT
TUBING UROLOGY SET (TUBING) IMPLANT

## 2019-08-17 NOTE — Op Note (Signed)
Procedure: 1.  Cystoscopy with removal of right double-J stent. 2.  Cystoscopy with placement of right double-J stent.  Preop diagnosis: Chronic right ureteral obstruction.  Postop diagnosis: Same.  Surgeon: Dr. [name].  Anesthesia: General.  Specimen: None.  Drain: 6 [place] by 24 cm right contour double-J stent.  EBL: None.  Complications: None.  Indications: The patient is a 73 year old white female with a history of chronic right proximal ureteral obstruction who was managed with stent changes every 3 months and she is due for stent change today.  Procedure: She was given 2 g of Ancef.  A general anesthetic was induced.  She was placed in lithotomy position and fitted with PAS hose.  Her perineum and genitalia were prepped with Betadine solution and she was draped in usual sterile fashion.  Cystoscopy was performed using a 23 [place] scope and 30 degree lens.  Examination revealed a normal urethra.  The bladder wall was smooth and pale without tumors, stones or inflammation.  But the urine was quite turbid.  The left ureteral orifice was unremarkable.  The right ureteral orifice had some peri-meatal erythema from stent irritation and a stent loop in good position.  The stent loop was grasped with a grasping forceps and pulled the urethral meatus.  A sensor guidewire was passed through the stent to the kidney under fluoroscopic guidance and the stent was removed.  The cystoscope was reinserted over the wire and a fresh 6 [place] by 24 cm contour double-J stent was advanced the kidney over the wire under fluoroscopic guidance.  Wire was removed, leaving a good coil in the kidney and a good coil in the bladder.  The bladder was drained and the cystoscope was removed.  She was taken down from lithotomy position, her anesthetic was reversed and she was moved recovery in stable condition.  There were no complications.

## 2019-08-17 NOTE — Anesthesia Preprocedure Evaluation (Addendum)
Anesthesia Evaluation  Patient identified by MRN, date of birth, ID band Patient awake    Reviewed: Allergy & Precautions, NPO status , Patient's Chart, lab work & pertinent test results  History of Anesthesia Complications Negative for: history of anesthetic complications  Airway Mallampati: II  TM Distance: >3 FB Neck ROM: Full    Dental  (+) Upper Dentures, Missing, Dental Advisory Given   Pulmonary COPD, former smoker (quit 1985),  08/14/2019 SARS coronavirus NEG   breath sounds clear to auscultation       Cardiovascular hypertension, Pt. on medications (-) angina Rhythm:Regular Rate:Normal     Neuro/Psych Anxiety Chronic back pain    GI/Hepatic GERD  Medicated and Controlled,(+)     substance abuse  ,   Endo/Other  diabetes (glu 82), Insulin DependentMorbid obesity  Renal/GU Renal InsufficiencyRenal disease (creat 1.2)     Musculoskeletal  (+) Arthritis , Osteoarthritis,  narcotic dependent  Abdominal (+) + obese,   Peds  Hematology negative hematology ROS (+)   Anesthesia Other Findings   Reproductive/Obstetrics                            Anesthesia Physical Anesthesia Plan  ASA: III  Anesthesia Plan: General   Post-op Pain Management:    Induction: Intravenous  PONV Risk Score and Plan: 3 and Ondansetron and Dexamethasone  Airway Management Planned: LMA  Additional Equipment:   Intra-op Plan:   Post-operative Plan:   Informed Consent: I have reviewed the patients History and Physical, chart, labs and discussed the procedure including the risks, benefits and alternatives for the proposed anesthesia with the patient or authorized representative who has indicated his/her understanding and acceptance.     Dental advisory given  Plan Discussed with: CRNA and Surgeon  Anesthesia Plan Comments:         Anesthesia Quick Evaluation

## 2019-08-17 NOTE — Anesthesia Postprocedure Evaluation (Signed)
Anesthesia Post Note  Patient: Valerie [name]  Procedure(s) Performed: CYSTOSCOPY WITH STENT EXCHANGE (Right )     Patient location during evaluation: PACU Anesthesia Type: General Level of consciousness: awake and alert, oriented and patient cooperative Pain management: pain level controlled Vital Signs Assessment: post-procedure vital signs reviewed and stable Respiratory status: spontaneous breathing, nonlabored ventilation and respiratory function stable Cardiovascular status: blood pressure returned to baseline and stable Postop Assessment: no apparent nausea or vomiting and adequate PO intake Anesthetic complications: no    Last Vitals:  Vitals:   08/17/19 1145 08/17/19 1200  BP: 109/76 111/60  Pulse: 67 66  Resp: 20 15  Temp:    SpO2: 95% 94%    Last Pain:  Vitals:   08/17/19 1200  TempSrc:   PainSc: 5                  Suhaila Troiano,E. Jerico Grisso

## 2019-08-17 NOTE — Anesthesia Procedure Notes (Signed)
Procedure Name: LMA Insertion Date/Time: 08/17/2019 11:03 AM Performed by: Raenette Rover, CRNA Pre-anesthesia Checklist: Patient identified, Emergency Drugs available, Suction available and Patient being monitored Patient Re-evaluated:Patient Re-evaluated prior to induction Oxygen Delivery Method: Circle system utilized Preoxygenation: Pre-oxygenation with 100% oxygen Induction Type: IV induction LMA: LMA inserted LMA Size: 4.0 Number of attempts: 1 Placement Confirmation: positive ETCO2,  CO2 detector and breath sounds checked- equal and bilateral Tube secured with: Tape Dental Injury: Teeth and Oropharynx as per pre-operative assessment

## 2019-08-17 NOTE — Interval H&P Note (Signed)
History and Physical Interval Note:  08/17/2019 10:51 AM  Valerie [name]  has presented today for surgery, with the diagnosis of right ureteral obstruction.  The various methods of treatment have been discussed with the patient and family. After consideration of risks, benefits and other options for treatment, the patient has consented to  Procedure(s): CYSTOSCOPY WITH STENT EXCHANGE (Right) as a surgical intervention.  The patient's history has been reviewed, patient examined, no change in status, stable for surgery.  I have reviewed the patient's chart and labs.  Questions were answered to the patient's satisfaction.     [name]

## 2019-08-17 NOTE — Transfer of Care (Signed)
Immediate Anesthesia Transfer of Care Note  Patient: Valerie [name]  Procedure(s) Performed: CYSTOSCOPY WITH STENT EXCHANGE (Right )  Patient Location: PACU  Anesthesia Type:General  Level of Consciousness: awake, alert , oriented, drowsy and patient cooperative  Airway & Oxygen Therapy: Patient Spontanous Breathing and Patient connected to nasal cannula oxygen  Post-op Assessment: Report given to RN and Post -op Vital signs reviewed and stable  Post vital signs: Reviewed and stable  Last Vitals:  Vitals Value Taken Time  BP 115/64 08/17/19 1121  Temp    Pulse 66 08/17/19 1123  Resp 14 08/17/19 1123  SpO2 100 % 08/17/19 1123  Vitals shown include unvalidated device data.  Last Pain:  Vitals:   08/17/19 0909  TempSrc: Oral  PainSc: 0-No pain      Patients Stated Pain Goal: 5 (16/10/96 0454)  Complications: No apparent anesthesia complications

## 2019-08-17 NOTE — Discharge Instructions (Signed)
Ureteral Stent Implantation, Care After °This sheet gives you information about how to care for yourself after your procedure. Your health care provider may also give you more specific instructions. If you have problems or questions, contact your health care provider. °What can I expect after the procedure? °After the procedure, it is common to have: °· Nausea. °· Mild pain when you urinate. You may feel this pain in your lower back or lower abdomen. The pain should stop within a few minutes after you urinate. This may last for up to 1 week. °· A small amount of blood in your urine for several days. °Follow these instructions at home: °Medicines °· Take over-the-counter and prescription medicines only as told by your health care provider. °· If you were prescribed an antibiotic medicine, take it as told by your health care provider. Do not stop taking the antibiotic even if you start to feel better. °· Do not drive for 24 hours if you were given a sedative during your procedure. °· Ask your health care provider if the medicine prescribed to you requires you to avoid driving or using heavy machinery. °Activity °· Rest as told by your health care provider. °· Avoid sitting for a long time without moving. Get up to take short walks every 1-2 hours. This is important to improve blood flow and breathing. Ask for help if you feel weak or unsteady. °· Return to your normal activities as told by your health care provider. Ask your health care provider what activities are safe for you. °General instructions ° °· Watch for any blood in your urine. Call your health care provider if the amount of blood in your urine increases. °· If you have a catheter: °? Follow instructions from your health care provider about taking care of your catheter and collection bag. °? Do not take baths, swim, or use a hot tub until your health care provider approves. Ask your health care provider if you may take showers. You may only be allowed to  take sponge baths. °· Drink enough fluid to keep your urine pale yellow. °· Do not use any products that contain nicotine or tobacco, such as cigarettes, e-cigarettes, and chewing tobacco. These can delay healing after surgery. If you need help quitting, ask your health care provider. °· Keep all follow-up visits as told by your health care provider. This is important. °Contact a health care provider if: °· You have pain that gets worse or does not get better with medicine, especially pain when you urinate. °· You have difficulty urinating. °· You feel nauseous or you vomit repeatedly during a period of more than 2 days after the procedure. °Get help right away if: °· Your urine is dark red or has blood clots in it. °· You are leaking urine (have incontinence). °· The end of the stent comes out of your urethra. °· You cannot urinate. °· You have sudden, sharp, or severe pain in your abdomen or lower back. °· You have a fever. °· You have swelling or pain in your legs. °· You have difficulty breathing. °Summary °· After the procedure, it is common to have mild pain when you urinate that goes away within a few minutes after you urinate. This may last for up to 1 week. °· Watch for any blood in your urine. Call your health care provider if the amount of blood in your urine increases. °· Take over-the-counter and prescription medicines only as told by your health care provider. °· Drink   enough fluid to keep your urine pale yellow. °This information is not intended to replace advice given to you by your health care provider. Make sure you discuss any questions you have with your health care provider. °Document Released: 08/16/2013 Document Revised: 09/20/2018 Document Reviewed: 09/21/2018 °Elsevier Patient Education © 2020 [employer] ° ° °Post Anesthesia Home Care Instructions ° °Activity: °Get plenty of rest for the remainder of the day. A responsible individual must stay with you for 24 hours following the  procedure.  °For the next 24 hours, DO NOT: °-Drive a car °-Operate machinery °-Drink alcoholic beverages °-Take any medication unless instructed by your physician °-Make any legal decisions or sign important papers. ° °Meals: °Start with liquid foods such as gelatin or soup. Progress to regular foods as tolerated. Avoid greasy, spicy, heavy foods. If nausea and/or vomiting occur, drink only clear liquids until the nausea and/or vomiting subsides. Call your physician if vomiting continues. ° °Special Instructions/Symptoms: °Your throat may feel dry or sore from the anesthesia or the breathing tube placed in your throat during surgery. If this causes discomfort, gargle with warm salt water. The discomfort should disappear within 24 hours. ° °If you had a scopolamine patch placed behind your ear for the management of post- operative nausea and/or vomiting: ° °1. The medication in the patch is effective for 72 hours, after which it should be removed.  Wrap patch in a tissue and discard in the trash. Wash hands thoroughly with soap and water. °2. You may remove the patch earlier than 72 hours if you experience unpleasant side effects which may include dry mouth, dizziness or visual disturbances. °3. Avoid touching the patch. Wash your hands with soap and water after contact with the patch. °   ° ° °

## 2019-08-18 ENCOUNTER — Encounter (HOSPITAL_BASED_OUTPATIENT_CLINIC_OR_DEPARTMENT_OTHER): Payer: Self-pay | Admitting: Urology

## 2019-11-17 ENCOUNTER — Other Ambulatory Visit: Payer: Self-pay | Admitting: Urology

## 2019-11-20 ENCOUNTER — Other Ambulatory Visit: Payer: Self-pay | Admitting: Urology

## 2019-11-29 ENCOUNTER — Encounter (HOSPITAL_BASED_OUTPATIENT_CLINIC_OR_DEPARTMENT_OTHER): Payer: Self-pay | Admitting: *Deleted

## 2019-11-29 ENCOUNTER — Other Ambulatory Visit: Payer: Self-pay

## 2019-11-29 NOTE — Progress Notes (Signed)
Spoke w/ via phone for pre-op interview---[facility] Lab needs dos----   I stat 8            Lab results------records chart/epic: ekg 08-17-2019, lov dr altheimer endocrinology 06-29-2019 COVID test ------12-01-2019 Arrive at -------700 am 12-05-2019 NPO after ------midnight Medications to take morning of surgery -----patient wishes to only yake oxymorphone and oxycontin am of surgery Diabetic medication -----no insulin am of sugrery, patient aware Patient Special Instructions ----- Pre-Op special Istructions ----- Patient verbalized understanding of instructions that were given at this phone interview. Patient denies shortness of breath, chest pain, fever, cough a this phone interview.

## 2019-12-01 ENCOUNTER — Other Ambulatory Visit (HOSPITAL_COMMUNITY)
Admission: RE | Admit: 2019-12-01 | Discharge: 2019-12-01 | Disposition: A | Discharge status: Home / Self Care | Payer: Medicare Other | Source: Ambulatory Visit | Attending: Urology | Admitting: Urology

## 2019-12-01 DIAGNOSIS — Z01812 Encounter for preprocedural laboratory examination: Secondary | ICD-10-CM | POA: Insufficient documentation

## 2019-12-01 DIAGNOSIS — Z20828 Contact with and (suspected) exposure to other viral communicable diseases: Secondary | ICD-10-CM | POA: Diagnosis not present

## 2019-12-02 LAB — NOVEL CORONAVIRUS, NAA (HOSP ORDER, SEND-OUT TO REF LAB; TAT 18-24 HRS): SARS-CoV-2, NAA: NOT DETECTED

## 2019-12-04 NOTE — H&P (Signed)
CC/HPI: [name] was contacted for a video telehealth visit in preparation for a stent change. Her husband dropped off a UA and the culture is pending. She has quite an extensive medical history with chronic comorbidities. 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 This is managed with suppressive keflex as well as ureteral stent which gets exchanged every three months. Her last stent exchange was performed on 08/17/19. She remains on Myrbetriq 50mg  for her OAB symptoms. She has ongoing but intermittent right flank pain that is mildly severe. She denies recent fevers. She is voiding well without complaints other than some stranguria.     ALLERGIES: None   MEDICATIONS: Keflex  Bupropion Hcl  Celecoxib  Echinacea  Escitalopram Oxalate 10 mg tablet  Fish Oil CAPS Oral  Flonase 50 mcg/actuation spray, suspension Nasal  Furosemide 20 mg tablet Oral  Gabapentin  Humulin R 100 unit/ml vial Injection  Lipitor 10 mg tablet Oral  Miralax  Mucinex 600 mg tablet, extended release 12 hr Oral  Mucinex Dm  Multivitamin Women 50 Plus  Myrbetriq 50 mg tablet, extended release 24 hr Oral  Ondansetron Odt 4 mg tablet,disintegrating Oral  Oxycontin  Pantoprazole Sodium 40 mg tablet, delayed release Oral  Salmon Oil  Sertraline Hcl     Notes: She has increased the oxymorphone for her back.   GU PSH: Cysto Uretero Biopsy Fulgura - 2017 Cystoscopy Insert Stent, Right - 08/17/2019, Right - 05/23/2019, Right - 02/09/2019, Right - 11/29/2018, Right - 08/11/2018, Right - 2019, Right - 2018, Right - 2018, Right - 2018, 2017, 2017 Cystoscopy Ureteroscopy, Right - 2018, 2017 Locm 300-399Mg /Ml Iodine,1Ml - 2018 Rpr Umbil Hern; Reduc < 5 Yr - 2017       PSH Notes: Epidural injections 2017   NON-GU PSH: Laparoscopy, Surgical; Repair Umbilical Hernia Pancreas Surgery (Unspecified) - 2017 Small bowel resection - 2017     GU PMH:  RLQ pain, She has some increased pain in the RLQ. I will move up her stent exchange to see if that will help. - 01/30/2019 Chronic kidney disease stage 3 (GFR 30-60) (Stable), She had a lot of questions about her kidney dysfunction. She has CKD3. I have recommended she not take Celebrex but also suggested she bring this up with Dr. 03/31/2019. - 11/10/2018 Hydronephrosis Unspec (Worsening), She is overdue for a stent exchange and has some increased pain. I will get a urine culture and get her set up for the stent exchange next week. - 2019, She is due for a stent change in the next 4-6 weeks. I will get that set up. , - 2018, She is doing well post stent exchanged. I will have her return in 3 months to arrange the next exchange. , - 2018 (Stable), Right, - 2017 (Improving), There is minimal residual right hydro noted on 2018. , - 2017, Hydronephrosis, right, - 2017 Urinary Urgency, Her frequency and urgency are well controlled on Myrbetriq. - 2018 Gross hematuria, She has gross hematuria that appears to be originating in the right kidney and she has chronic inflammation and obstruction of that kidney, I am going to get a cytology today and will get her set up for ureteroscopy and stenting to better evaluate the collecting system. With CKD3 I think it would be best if she is able to avoid right nephrectomy. I have reviewed the risks of the procedure in detail including bleeding, infection, ureteral injury, need for a stent and secondary procedures,  thrombotic events and anesthetic complications. - 2018, (Acute), Culture urine. Will continue with daily suppression Cephalexin until culture finalized. If she has UTI now resistant to Cephalexin will change suppression. Will proceed with hematuria workup with CT and possible cysto , - 2018, Gross hematuria, - 2017 Proteinuria (Acute), With new onset Proteinuria in pt with diabetes and hypertension may need nephrology referral. Will check BUN/creatine today - 2018 Chronic  cystitis (w/o hematuria) (Stable), Her UA looks infected but she just came of our antibiotic and is on amoxicillin. She is free of symptoms. - 2017, Chronic cystitis, - 2017 Urinary Tract Inf, Unspec site, Pyuria - 2017 Incomplete bladder emptying, Feeling of incomplete bladder emptying - 2017 Postmenopausal atrophic vaginitis, Atrophic vaginitis - 2017    NON-GU PMH: Anxiety, Anxiety - 2017 Encounter for general adult medical examination without abnormal findings, Encounter for preventive health examination - 2017 Personal history of other diseases of the circulatory system, History of hypertension - 2017 Personal history of other diseases of the digestive system, History of small bowel obstruction - 2017, History of pancreatitis, - 2017, History of esophageal reflux, - 2017 Personal history of other diseases of the musculoskeletal system and connective tissue, History of arthritis - 2017 Personal history of other endocrine, nutritional and metabolic disease, History of diabetes mellitus - 2017, History of hypercholesterolemia, - 2017    FAMILY HISTORY: Death of family member - Runs In Family Prostate Cancer - Runs In Family   SOCIAL HISTORY: Marital Status: Married Preferred Language: English; Ethnicity: Not Hispanic Or Latino; Race: White Current Smoking Status: Patient does not smoke anymore.  Does not use smokeless tobacco. Has never drank.  Does not use drugs. Drinks 1 caffeinated drink per day.    REVIEW OF SYSTEMS:    GU Review Female:   Patient reports frequent urination. Patient denies hard to postpone urination, burning /pain with urination, get up at night to urinate, leakage of urine, stream starts and stops, trouble starting your stream, have to strain to urinate, and being pregnant.  Gastrointestinal (Upper):   Patient denies nausea, vomiting, and indigestion/ heartburn.  Gastrointestinal (Lower):   Patient denies diarrhea and constipation.  Constitutional:   Patient  denies fever, night sweats, weight loss, and fatigue.  Skin:   Patient denies skin rash/ lesion and itching.  Eyes:   Patient denies blurred vision and double vision.  Ears/ Nose/ Throat:   Patient denies sore throat and sinus problems.  Hematologic/Lymphatic:   Patient denies swollen glands and easy bruising.  Cardiovascular:   Patient denies leg swelling and chest pains.  Respiratory:   Patient reports shortness of breath. Patient denies cough.  Endocrine:   Patient denies excessive thirst.  Musculoskeletal:   Patient reports back pain. Patient denies joint pain.  Neurological:   Patient denies headaches and dizziness.  Psychologic:   Patient denies depression and anxiety.   PAST DATA REVIEWED:  Source Of History:  Patient  Urine Test Review:   Urinalysis   PROCEDURES:          Telehealth This patient encounter is appropriate and reasonable under the circumstances given the patient's particular presentation at this time. The patient has been advised of the potential risks and limitations of this mode of treatment (including, but not limited to, the absence of in-person examination) and has agreed to be treated in a remote fashion in spite of them.   Any and all of the patient's/patient's family's questions on this issue have been answered, and I have made no promises  or guarantees to the patient. The patient has also been advised to contact this office for worsening conditions or problems, and seek emergency medical treatment and/or call 911 if the patient deems either necessary.    ASSESSMENT:      ICD-10 Details  1 GU:   Chronic cystitis (w/o hematuria) - N30.20 Stable - I will determine preop antibiotics based on her culture but she will continue the keflex in the meantime.  2   Hydronephrosis Unspec - N13.30 Stable - I will get her set up for cystoscopy and right stent exchange in the near future.      PLAN:           Schedule Return Visit/Planned Activity: Next Available  Appointment - Schedule Surgery  Procedure: Approximately 2 Weeks - Cystoscopy Insert Stent - [id number]          Document Letter(s):  Created for Patient: Clinical Summary

## 2019-12-05 ENCOUNTER — Other Ambulatory Visit: Payer: Self-pay

## 2019-12-05 ENCOUNTER — Ambulatory Visit (HOSPITAL_BASED_OUTPATIENT_CLINIC_OR_DEPARTMENT_OTHER): Payer: Medicare Other | Admitting: Certified Registered Nurse Anesthetist

## 2019-12-05 ENCOUNTER — Encounter (HOSPITAL_BASED_OUTPATIENT_CLINIC_OR_DEPARTMENT_OTHER): Payer: Self-pay | Admitting: Emergency Medicine

## 2019-12-05 ENCOUNTER — Ambulatory Visit (HOSPITAL_BASED_OUTPATIENT_CLINIC_OR_DEPARTMENT_OTHER)
Admission: RE | Admit: 2019-12-05 | Discharge: 2019-12-05 | Disposition: A | Discharge status: Home / Self Care | Payer: Medicare Other | Attending: Urology | Admitting: Urology

## 2019-12-05 ENCOUNTER — Encounter (HOSPITAL_BASED_OUTPATIENT_CLINIC_OR_DEPARTMENT_OTHER)
Admission: RE | Disposition: A | Discharge status: Home / Self Care | Payer: Self-pay | Source: Home / Self Care | Attending: Urology

## 2019-12-05 DIAGNOSIS — E7800 Pure hypercholesterolemia, unspecified: Secondary | ICD-10-CM | POA: Diagnosis not present

## 2019-12-05 DIAGNOSIS — Z791 Long term (current) use of non-steroidal anti-inflammatories (NSAID): Secondary | ICD-10-CM | POA: Diagnosis not present

## 2019-12-05 DIAGNOSIS — N183 Chronic kidney disease, stage 3 unspecified: Secondary | ICD-10-CM | POA: Insufficient documentation

## 2019-12-05 DIAGNOSIS — Z79899 Other long term (current) drug therapy: Secondary | ICD-10-CM | POA: Insufficient documentation

## 2019-12-05 DIAGNOSIS — Z79891 Long term (current) use of opiate analgesic: Secondary | ICD-10-CM | POA: Diagnosis not present

## 2019-12-05 DIAGNOSIS — Z6841 Body Mass Index (BMI) 40.0 and over, adult: Secondary | ICD-10-CM | POA: Diagnosis not present

## 2019-12-05 DIAGNOSIS — I129 Hypertensive chronic kidney disease with stage 1 through stage 4 chronic kidney disease, or unspecified chronic kidney disease: Secondary | ICD-10-CM | POA: Diagnosis not present

## 2019-12-05 DIAGNOSIS — Z792 Long term (current) use of antibiotics: Secondary | ICD-10-CM | POA: Insufficient documentation

## 2019-12-05 DIAGNOSIS — F419 Anxiety disorder, unspecified: Secondary | ICD-10-CM | POA: Diagnosis not present

## 2019-12-05 DIAGNOSIS — E1140 Type 2 diabetes mellitus with diabetic neuropathy, unspecified: Secondary | ICD-10-CM | POA: Diagnosis not present

## 2019-12-05 DIAGNOSIS — Z794 Long term (current) use of insulin: Secondary | ICD-10-CM | POA: Insufficient documentation

## 2019-12-05 DIAGNOSIS — Z87891 Personal history of nicotine dependence: Secondary | ICD-10-CM | POA: Diagnosis not present

## 2019-12-05 DIAGNOSIS — E1122 Type 2 diabetes mellitus with diabetic chronic kidney disease: Secondary | ICD-10-CM | POA: Insufficient documentation

## 2019-12-05 DIAGNOSIS — M1990 Unspecified osteoarthritis, unspecified site: Secondary | ICD-10-CM | POA: Insufficient documentation

## 2019-12-05 DIAGNOSIS — K219 Gastro-esophageal reflux disease without esophagitis: Secondary | ICD-10-CM | POA: Diagnosis not present

## 2019-12-05 DIAGNOSIS — N3289 Other specified disorders of bladder: Secondary | ICD-10-CM | POA: Diagnosis not present

## 2019-12-05 DIAGNOSIS — N3020 Other chronic cystitis without hematuria: Secondary | ICD-10-CM | POA: Diagnosis present

## 2019-12-05 DIAGNOSIS — N136 Pyonephrosis: Secondary | ICD-10-CM | POA: Insufficient documentation

## 2019-12-05 HISTORY — PX: CYSTOSCOPY W/ URETERAL STENT PLACEMENT: SHX1429

## 2019-12-05 LAB — POCT I-STAT, CHEM 8
BUN: 28 mg/dL — ABNORMAL HIGH (ref 8–23)
Calcium, Ion: 1.2 mmol/L (ref 1.15–1.40)
Chloride: 101 mmol/L (ref 98–111)
Creatinine, Ser: 1.3 mg/dL — ABNORMAL HIGH (ref 0.44–1.00)
Glucose, Bld: 102 mg/dL — ABNORMAL HIGH (ref 70–99)
HCT: 40 % (ref 36.0–46.0)
Hemoglobin: 13.6 g/dL (ref 12.0–15.0)
Potassium: 4.3 mmol/L (ref 3.5–5.1)
Sodium: 142 mmol/L (ref 135–145)
TCO2: 27 mmol/L (ref 22–32)

## 2019-12-05 SURGERY — CYSTOSCOPY, FLEXIBLE, WITH STENT REPLACEMENT
Anesthesia: General | Site: Pelvis | Laterality: Right

## 2019-12-05 MED ORDER — PROPOFOL 10 MG/ML IV BOLUS
INTRAVENOUS | Status: DC | PRN
  Administered 2019-12-05: 100 mg via INTRAVENOUS

## 2019-12-05 MED ORDER — CIPROFLOXACIN IN D5W 200 MG/100ML IV SOLN
INTRAVENOUS | Status: AC
  Filled 2019-12-05: qty 100

## 2019-12-05 MED ORDER — PROPOFOL 10 MG/ML IV BOLUS
INTRAVENOUS | Status: AC
  Filled 2019-12-05: qty 20

## 2019-12-05 MED ORDER — ONDANSETRON HCL 4 MG/2ML IJ SOLN
INTRAMUSCULAR | Status: AC
  Filled 2019-12-05: qty 2

## 2019-12-05 MED ORDER — SODIUM CHLORIDE 0.9 % IV SOLN
250.0000 mL | INTRAVENOUS | Status: DC | PRN
  Filled 2019-12-05: qty 250

## 2019-12-05 MED ORDER — SODIUM CHLORIDE 0.9% FLUSH
3.0000 mL | INTRAVENOUS | Status: DC | PRN
  Filled 2019-12-05: qty 3

## 2019-12-05 MED ORDER — CIPROFLOXACIN IN D5W 200 MG/100ML IV SOLN
200.0000 mg | Freq: Two times a day (BID) | INTRAVENOUS | Status: DC
  Administered 2019-12-05: 200 mg via INTRAVENOUS
  Filled 2019-12-05: qty 100

## 2019-12-05 MED ORDER — ACETAMINOPHEN 500 MG PO TABS
ORAL_TABLET | ORAL | Status: AC
  Filled 2019-12-05: qty 2

## 2019-12-05 MED ORDER — ONDANSETRON HCL 4 MG/2ML IJ SOLN
INTRAMUSCULAR | Status: DC | PRN
  Administered 2019-12-05: 4 mg via INTRAVENOUS

## 2019-12-05 MED ORDER — PHENYLEPHRINE 40 MCG/ML (10ML) SYRINGE FOR IV PUSH (FOR BLOOD PRESSURE SUPPORT)
PREFILLED_SYRINGE | INTRAVENOUS | Status: AC
  Filled 2019-12-05: qty 10

## 2019-12-05 MED ORDER — LIDOCAINE HCL (CARDIAC) PF 100 MG/5ML IV SOSY
PREFILLED_SYRINGE | INTRAVENOUS | Status: DC | PRN
  Administered 2019-12-05: 60 mg via INTRAVENOUS

## 2019-12-05 MED ORDER — SODIUM CHLORIDE 0.9 % IV SOLN
INTRAVENOUS | Status: DC
  Administered 2019-12-05: 08:00:00 via INTRAVENOUS
  Filled 2019-12-05: qty 1000

## 2019-12-05 MED ORDER — ACETAMINOPHEN 325 MG PO TABS
650.0000 mg | ORAL_TABLET | ORAL | Status: DC | PRN
  Filled 2019-12-05: qty 2

## 2019-12-05 MED ORDER — FENTANYL CITRATE (PF) 100 MCG/2ML IJ SOLN
25.0000 ug | INTRAMUSCULAR | Status: DC | PRN
  Filled 2019-12-05: qty 1

## 2019-12-05 MED ORDER — PHENYLEPHRINE HCL (PRESSORS) 10 MG/ML IV SOLN
INTRAVENOUS | Status: DC | PRN
  Administered 2019-12-05: 80 ug via INTRAVENOUS

## 2019-12-05 MED ORDER — OXYCODONE HCL 5 MG PO TABS
ORAL_TABLET | ORAL | Status: AC
  Filled 2019-12-05: qty 1

## 2019-12-05 MED ORDER — FENTANYL CITRATE (PF) 100 MCG/2ML IJ SOLN
INTRAMUSCULAR | Status: DC | PRN
  Administered 2019-12-05: 50 ug via INTRAVENOUS

## 2019-12-05 MED ORDER — MORPHINE SULFATE (PF) 2 MG/ML IV SOLN
2.0000 mg | INTRAVENOUS | Status: DC | PRN
  Filled 2019-12-05: qty 1

## 2019-12-05 MED ORDER — ACETAMINOPHEN 650 MG RE SUPP
650.0000 mg | RECTAL | Status: DC | PRN
  Filled 2019-12-05: qty 1

## 2019-12-05 MED ORDER — DEXAMETHASONE SODIUM PHOSPHATE 10 MG/ML IJ SOLN
INTRAMUSCULAR | Status: AC
  Filled 2019-12-05: qty 1

## 2019-12-05 MED ORDER — SODIUM CHLORIDE 0.9% FLUSH
3.0000 mL | Freq: Two times a day (BID) | INTRAVENOUS | Status: DC
  Filled 2019-12-05: qty 3

## 2019-12-05 MED ORDER — LIDOCAINE 2% (20 MG/ML) 5 ML SYRINGE
INTRAMUSCULAR | Status: AC
  Filled 2019-12-05: qty 5

## 2019-12-05 MED ORDER — DEXAMETHASONE SODIUM PHOSPHATE 4 MG/ML IJ SOLN
INTRAMUSCULAR | Status: DC | PRN
  Administered 2019-12-05: 4 mg via INTRAVENOUS

## 2019-12-05 MED ORDER — FENTANYL CITRATE (PF) 100 MCG/2ML IJ SOLN
INTRAMUSCULAR | Status: AC
  Filled 2019-12-05: qty 2

## 2019-12-05 MED ORDER — ACETAMINOPHEN 500 MG PO TABS
1000.0000 mg | ORAL_TABLET | Freq: Once | ORAL | Status: AC
  Administered 2019-12-05: 1000 mg via ORAL
  Filled 2019-12-05: qty 2

## 2019-12-05 MED ORDER — OXYCODONE HCL 5 MG PO TABS
5.0000 mg | ORAL_TABLET | ORAL | Status: DC | PRN
  Administered 2019-12-05: 5 mg via ORAL
  Filled 2019-12-05: qty 2

## 2019-12-05 MED ORDER — STERILE WATER FOR IRRIGATION IR SOLN
Status: DC | PRN
  Administered 2019-12-05: 3000 mL

## 2019-12-05 SURGICAL SUPPLY — 25 items
BAG DRAIN URO-CYSTO SKYTR STRL (DRAIN) ×3 IMPLANT
BASKET STONE 1.7 NGAGE (UROLOGICAL SUPPLIES) IMPLANT
BASKET ZERO TIP NITINOL 2.4FR (BASKET) IMPLANT
CATH URET 5FR 28IN CONE TIP (BALLOONS)
CATH URET 5FR 28IN OPEN ENDED (CATHETERS) IMPLANT
CATH URET 5FR 70CM CONE TIP (BALLOONS) IMPLANT
CLOTH BEACON ORANGE TIMEOUT ST (SAFETY) ×3 IMPLANT
ELECT REM PT RETURN 9FT ADLT (ELECTROSURGICAL)
ELECTRODE REM PT RTRN 9FT ADLT (ELECTROSURGICAL) IMPLANT
FIBER LASER FLEXIVA 365 (UROLOGICAL SUPPLIES) IMPLANT
FIBER LASER TRAC TIP (UROLOGICAL SUPPLIES) IMPLANT
GLOVE SURG SS PI 8.0 STRL IVOR (GLOVE) ×3 IMPLANT
GOWN STRL REUS W/TWL XL LVL3 (GOWN DISPOSABLE) ×3 IMPLANT
GUIDEWIRE ANG ZIPWIRE 038X150 (WIRE) IMPLANT
GUIDEWIRE STR DUAL SENSOR (WIRE) ×3 IMPLANT
IV NS IRRIG 3000ML ARTHROMATIC (IV SOLUTION) IMPLANT
KIT TURNOVER CYSTO (KITS) ×3 IMPLANT
MANIFOLD NEPTUNE II (INSTRUMENTS) ×3 IMPLANT
NS IRRIG 500ML POUR BTL (IV SOLUTION) ×3 IMPLANT
PACK CYSTO (CUSTOM PROCEDURE TRAY) ×3 IMPLANT
STENT URET 6FRX24 CONTOUR (STENTS) ×3 IMPLANT
TUBE CONNECTING 12'X1/4 (SUCTIONS) ×1
TUBE CONNECTING 12X1/4 (SUCTIONS) ×2 IMPLANT
TUBING UROLOGY SET (TUBING) IMPLANT
WATER STERILE IRR 3000ML UROMA (IV SOLUTION) ×3 IMPLANT

## 2019-12-05 NOTE — Interval H&P Note (Signed)
No change in history

## 2019-12-05 NOTE — Discharge Instructions (Signed)
Post Anesthesia Home Care Instructions  Activity: Get plenty of rest for the remainder of the day. A responsible individual must stay with you for 24 hours following the procedure.  For the next 24 hours, DO NOT: -Drive a car -Paediatric nurse -Drink alcoholic beverages -Take any medication unless instructed by your physician -Make any legal decisions or sign important papers.  Meals: Start with liquid foods such as gelatin or soup. Progress to regular foods as tolerated. Avoid greasy, spicy, heavy foods. If nausea and/or vomiting occur, drink only clear liquids until the nausea and/or vomiting subsides. Call your physician if vomiting continues.  Special Instructions/Symptoms: Your throat may feel dry or sore from the anesthesia or the breathing tube placed in your throat during surgery. If this causes discomfort, gargle with warm salt water. The discomfort should disappear within 24 hours.  If you had a scopolamine patch placed behind your ear for the management of post- operative nausea and/or vomiting:  1. The medication in the patch is effective for 72 hours, after which it should be removed.  Wrap patch in a tissue and discard in the trash. Wash hands thoroughly with soap and water. 2. You may remove the patch earlier than 72 hours if you experience unpleasant side effects which may include dry mouth, dizziness or visual disturbances. 3. Avoid touching the patch. Wash your hands with soap and water after contact with the patch.    Ureteral Stent Implantation, Care After This sheet gives you information about how to care for yourself after your procedure. Your health care provider may also give you more specific instructions. If you have problems or questions, contact your health care provider. What can I expect after the procedure? After the procedure, it is common to have:  Nausea.  Mild pain when you urinate. You may feel this pain in your lower back or lower abdomen. The  pain should stop within a few minutes after you urinate. This may last for up to 1 week.  A small amount of blood in your urine for several days. Follow these instructions at home: Medicines  Take over-the-counter and prescription medicines only as told by your health care provider.  If you were prescribed an antibiotic medicine, take it as told by your health care provider. Do not stop taking the antibiotic even if you start to feel better.  Do not drive for 24 hours if you were given a sedative during your procedure.  Ask your health care provider if the medicine prescribed to you requires you to avoid driving or using heavy machinery. Activity  Rest as told by your health care provider.  Avoid sitting for a long time without moving. Get up to take short walks every 1-2 hours. This is important to improve blood flow and breathing. Ask for help if you feel weak or unsteady.  Return to your normal activities as told by your health care provider. Ask your health care provider what activities are safe for you. General instructions   Watch for any blood in your urine. Call your health care provider if the amount of blood in your urine increases.  If you have a catheter: ? Follow instructions from your health care provider about taking care of your catheter and collection bag. ? Do not take baths, swim, or use a hot tub until your health care provider approves. Ask your health care provider if you may take showers. You may only be allowed to take sponge baths.  Drink enough fluid to keep  your urine pale yellow.  Do not use any products that contain nicotine or tobacco, such as cigarettes, e-cigarettes, and chewing tobacco. These can delay healing after surgery. If you need help quitting, ask your health care provider.  Keep all follow-up visits as told by your health care provider. This is important. Contact a health care provider if:  You have pain that gets worse or does not get  better with medicine, especially pain when you urinate.  You have difficulty urinating.  You feel nauseous or you vomit repeatedly during a period of more than 2 days after the procedure. Get help right away if:  Your urine is dark red or has blood clots in it.  You are leaking urine (have incontinence).  The end of the stent comes out of your urethra.  You cannot urinate.  You have sudden, sharp, or severe pain in your abdomen or lower back.  You have a fever.  You have swelling or pain in your legs.  You have difficulty breathing. Summary  After the procedure, it is common to have mild pain when you urinate that goes away within a few minutes after you urinate. This may last for up to 1 week.  Watch for any blood in your urine. Call your health care provider if the amount of blood in your urine increases.  Take over-the-counter and prescription medicines only as told by your health care provider.  Drink enough fluid to keep your urine pale yellow. This information is not intended to replace advice given to you by your health care provider. Make sure you discuss any questions you have with your health care provider. Document Released: 08/16/2013 Document Revised: 09/20/2018 Document Reviewed: 09/21/2018 Elsevier Patient Education  2020 [employer].

## 2019-12-05 NOTE — Transfer of Care (Signed)
Immediate Anesthesia Transfer of Care Note  Patient: Valerie [name]  Procedure(s) Performed: CYSTOSCOPY WITH STENT REPLACEMENT, BIOPSY  AND FULGERATION (Right Pelvis)  Patient Location: PACU  Anesthesia Type:General  Level of Consciousness: awake, alert  and oriented  Airway & Oxygen Therapy: Patient Spontanous Breathing and Patient connected to nasal cannula oxygen  Post-op Assessment: Report given to RN and Post -op Vital signs reviewed and stable  Post vital signs: Reviewed and stable  Last Vitals:  Vitals Value Taken Time  BP    Temp    Pulse    Resp    SpO2      Last Pain:  Vitals:   12/05/19 0748  TempSrc: Oral  PainSc: 0-No pain      Patients Stated Pain Goal: 5 (76/54/65 0354)  Complications: No apparent anesthesia complications

## 2019-12-05 NOTE — Anesthesia Preprocedure Evaluation (Addendum)
Anesthesia Evaluation  Patient identified by MRN, date of birth, ID band Patient awake    Reviewed: Allergy & Precautions, NPO status , Patient's Chart, lab work & pertinent test results  Airway Mallampati: III  TM Distance: >3 FB Neck ROM: Full    Dental  (+) Edentulous Upper   Pulmonary neg pulmonary ROS, former smoker,    Pulmonary exam normal breath sounds clear to auscultation       Cardiovascular hypertension, Normal cardiovascular exam Rhythm:Regular Rate:Normal  EKG 07/2019 Normal sinus rhythm Right bundle branch block Left anterior fascicular block Bifascicular block Left ventricular hypertrophy   Neuro/Psych PSYCHIATRIC DISORDERS Anxiety negative neurological ROS     GI/Hepatic Neg liver ROS, GERD  ,  Endo/Other  diabetes, Type 2, Insulin DependentMorbid obesity  Renal/GU Renal InsufficiencyRenal disease  negative genitourinary   Musculoskeletal  (+) Arthritis , narcotic dependent  Abdominal   Peds  Hematology negative hematology ROS (+)   Anesthesia Other Findings   Reproductive/Obstetrics                            Anesthesia Physical Anesthesia Plan  ASA: III  Anesthesia Plan: General   Post-op Pain Management:    Induction: Intravenous  PONV Risk Score and Plan: 3 and Ondansetron, Dexamethasone and Treatment may vary due to age or medical condition  Airway Management Planned: LMA  Additional Equipment:   Intra-op Plan:   Post-operative Plan: Extubation in OR  Informed Consent: I have reviewed the patients History and Physical, chart, labs and discussed the procedure including the risks, benefits and alternatives for the proposed anesthesia with the patient or authorized representative who has indicated his/her understanding and acceptance.     Dental advisory given  Plan Discussed with: CRNA  Anesthesia Plan Comments:        Anesthesia Quick  Evaluation

## 2019-12-05 NOTE — Anesthesia Postprocedure Evaluation (Signed)
Anesthesia Post Note  Patient: Valerie [name]  Procedure(s) Performed: CYSTOSCOPY WITH STENT REPLACEMENT, BLADDER BIOPSY  AND FULGERATION (Right Pelvis)     Patient location during evaluation: PACU Anesthesia Type: General Level of consciousness: awake and alert Pain management: pain level controlled Vital Signs Assessment: post-procedure vital signs reviewed and stable Respiratory status: spontaneous breathing, nonlabored ventilation, respiratory function stable and patient connected to nasal cannula oxygen Cardiovascular status: blood pressure returned to baseline and stable Postop Assessment: no apparent nausea or vomiting Anesthetic complications: no    Last Vitals:  Vitals:   12/05/19 0950 12/05/19 1000  BP:  111/62  Pulse:  76  Resp:  19  Temp:    SpO2: 100% (!) 89%    Last Pain:  Vitals:   12/05/19 1000  TempSrc:   PainSc: 5                  Timur Nibert L Issaac Shipper

## 2019-12-05 NOTE — Op Note (Signed)
Procedure: 1.  Cystoscopy with removal of right double-J stent. 2.  Cystoscopy with insertion of right double-J stent. 3.  Cystoscopy with bladder biopsy and fulguration.  Preop diagnosis: Chronic right ureteral obstruction.  Postop diagnosis: Same with bladder lesion at the right trigone.  Surgeon: Dr. [name].  Anesthesia: General.  Specimen: Bladder biopsy.  Drain: 6 [place] by 24 cm right contour double-J stent.  EBL: None.  Complications: None.  Indications: The patient is a 73 year old female with a history of chronic right ureteral obstruction and infection who is managed by every 3 month stent changes.  Procedure: She was given Cipro.  She was taken operating room where a general anesthetic was induced.  She was placed in lithotomy position and fitted with PAS hose.  Her perineum and genitalia were prepped with Betadine solution and she was draped in usual sterile fashion.  Cystoscopy was performed using the 23 [place] scope and the 30 degree lens.  Examination revealed a normal urethra.  There was a stent loop with encrustation at the right ureteral orifice and surrounding the orifice was some inflammatory edema with mild erythema however in the area between the meatus and the urethra there was some changes that suggest keratinization which is a new finding.  The remainder of the bladder wall mucosa is unremarkable and the left ureteral orifice is unremarkable.  The stent was grasped with a grasping forceps and pulled the urethral meatus.  A sensor guidewire was passed to the kidney under fluoroscopic guidance through the stent and the stent was removed.  The cystoscope was then replaced over the wire and a fresh 6 [place] by 24 cm contour double-J stent was placed to the right kidney under fluoroscopic guidance.  The wire was removed, leaving a good coil in the kidney and a good coil in the bladder.  A cup biopsy forceps was then used to take a biopsy from the keratinized  epithelium proximal to the bladder neck.  The biopsy site was then generously fulgurated with the Bugbee electrode along with some surrounding mucosa with a diameter of approximately 1 cm.  Once hemostasis was achieved, the bladder was drained and the cystoscope was removed.  She was taken down from lithotomy position, her anesthetic was reversed and she was moved to recovery in stable condition.  There were no complications.

## 2019-12-05 NOTE — Anesthesia Procedure Notes (Signed)
Procedure Name: LMA Insertion Date/Time: 12/05/2019 8:56 AM Performed by: Bufford Spikes, CRNA Pre-anesthesia Checklist: Patient identified, Emergency Drugs available, Suction available and Patient being monitored Patient Re-evaluated:Patient Re-evaluated prior to induction Oxygen Delivery Method: Circle system utilized Preoxygenation: Pre-oxygenation with 100% oxygen Induction Type: IV induction Ventilation: Mask ventilation without difficulty LMA: LMA inserted LMA Size: 4.0 Number of attempts: 1 Airway Equipment and Method: Bite block Placement Confirmation: positive ETCO2 Tube secured with: Tape Dental Injury: Teeth and Oropharynx as per pre-operative assessment

## 2019-12-06 ENCOUNTER — Encounter (HOSPITAL_BASED_OUTPATIENT_CLINIC_OR_DEPARTMENT_OTHER): Payer: Self-pay | Admitting: Urology

## 2019-12-06 LAB — SURGICAL PATHOLOGY

## 2020-01-29 DEATH — deceased
